# Patient Record
Sex: Female | Born: 1935 | Race: White | Hispanic: No | Marital: Married | Smoking: Current some day smoker
Health system: Southern US, Community
[De-identification: ages and names within clinical notes are randomized; demographics above are authoritative.]

## PROBLEM LIST (undated history)

## (undated) DIAGNOSIS — E049 Nontoxic goiter, unspecified: Secondary | ICD-10-CM

## (undated) DIAGNOSIS — M1990 Unspecified osteoarthritis, unspecified site: Secondary | ICD-10-CM

## (undated) DIAGNOSIS — G4700 Insomnia, unspecified: Secondary | ICD-10-CM

## (undated) DIAGNOSIS — I10 Essential (primary) hypertension: Secondary | ICD-10-CM

## (undated) DIAGNOSIS — G5600 Carpal tunnel syndrome, unspecified upper limb: Secondary | ICD-10-CM

## (undated) DIAGNOSIS — I739 Peripheral vascular disease, unspecified: Secondary | ICD-10-CM

## (undated) DIAGNOSIS — K219 Gastro-esophageal reflux disease without esophagitis: Secondary | ICD-10-CM

## (undated) DIAGNOSIS — K5090 Crohn's disease, unspecified, without complications: Secondary | ICD-10-CM

## (undated) DIAGNOSIS — I340 Nonrheumatic mitral (valve) insufficiency: Secondary | ICD-10-CM

## (undated) DIAGNOSIS — M7060 Trochanteric bursitis, unspecified hip: Secondary | ICD-10-CM

## (undated) DIAGNOSIS — G43909 Migraine, unspecified, not intractable, without status migrainosus: Secondary | ICD-10-CM

## (undated) DIAGNOSIS — G5603 Carpal tunnel syndrome, bilateral upper limbs: Principal | ICD-10-CM

## (undated) DIAGNOSIS — F329 Major depressive disorder, single episode, unspecified: Secondary | ICD-10-CM

## (undated) DIAGNOSIS — I779 Disorder of arteries and arterioles, unspecified: Secondary | ICD-10-CM

## (undated) DIAGNOSIS — N184 Chronic kidney disease, stage 4 (severe): Secondary | ICD-10-CM

## (undated) DIAGNOSIS — E039 Hypothyroidism, unspecified: Secondary | ICD-10-CM

## (undated) DIAGNOSIS — M810 Age-related osteoporosis without current pathological fracture: Secondary | ICD-10-CM

## (undated) DIAGNOSIS — E785 Hyperlipidemia, unspecified: Secondary | ICD-10-CM

## (undated) DIAGNOSIS — E119 Type 2 diabetes mellitus without complications: Secondary | ICD-10-CM

## (undated) DIAGNOSIS — F32A Depression, unspecified: Secondary | ICD-10-CM

## (undated) DIAGNOSIS — I2510 Atherosclerotic heart disease of native coronary artery without angina pectoris: Secondary | ICD-10-CM

## (undated) HISTORY — PX: APPENDECTOMY: SHX54

## (undated) HISTORY — DX: Hyperlipidemia, unspecified: E78.5

## (undated) HISTORY — DX: Carpal tunnel syndrome, bilateral upper limbs: G56.03

## (undated) HISTORY — DX: Age-related osteoporosis without current pathological fracture: M81.0

## (undated) HISTORY — DX: Nontoxic goiter, unspecified: E04.9

## (undated) HISTORY — DX: Trochanteric bursitis, unspecified hip: M70.60

## (undated) HISTORY — DX: Nonrheumatic mitral (valve) insufficiency: I34.0

## (undated) HISTORY — DX: Atherosclerotic heart disease of native coronary artery without angina pectoris: I25.10

## (undated) HISTORY — DX: Insomnia, unspecified: G47.00

## (undated) HISTORY — DX: Migraine, unspecified, not intractable, without status migrainosus: G43.909

## (undated) HISTORY — DX: Hypothyroidism, unspecified: E03.9

## (undated) HISTORY — DX: Chronic kidney disease, stage 4 (severe): N18.4

## (undated) HISTORY — PX: ABDOMINAL HYSTERECTOMY: SHX81

## (undated) HISTORY — PX: CATARACT EXTRACTION W/ INTRAOCULAR LENS  IMPLANT, BILATERAL: SHX1307

## (undated) HISTORY — DX: Carpal tunnel syndrome, unspecified upper limb: G56.00

## (undated) HISTORY — DX: Peripheral vascular disease, unspecified: I73.9

## (undated) HISTORY — DX: Gastro-esophageal reflux disease without esophagitis: K21.9

## (undated) HISTORY — DX: Unspecified osteoarthritis, unspecified site: M19.90

## (undated) HISTORY — DX: Depression, unspecified: F32.A

## (undated) HISTORY — DX: Disorder of arteries and arterioles, unspecified: I77.9

## (undated) HISTORY — DX: Major depressive disorder, single episode, unspecified: F32.9

---

## 2008-07-07 ENCOUNTER — Encounter: Admission: RE | Admit: 2008-07-07 | Discharge: 2008-07-07 | Payer: Self-pay | Admitting: Gastroenterology

## 2008-09-22 ENCOUNTER — Encounter: Admission: RE | Admit: 2008-09-22 | Discharge: 2008-09-22 | Payer: Self-pay | Admitting: Gastroenterology

## 2010-02-05 ENCOUNTER — Ambulatory Visit (HOSPITAL_COMMUNITY)
Admission: RE | Admit: 2010-02-05 | Discharge: 2010-02-05 | Payer: Self-pay | Source: Home / Self Care | Attending: Endocrinology | Admitting: Endocrinology

## 2010-05-07 ENCOUNTER — Other Ambulatory Visit: Payer: Self-pay | Admitting: Internal Medicine

## 2010-05-07 DIAGNOSIS — E049 Nontoxic goiter, unspecified: Secondary | ICD-10-CM

## 2010-05-09 ENCOUNTER — Ambulatory Visit
Admission: RE | Admit: 2010-05-09 | Discharge: 2010-05-09 | Disposition: A | Discharge status: Home / Self Care | Payer: Medicare Other | Source: Ambulatory Visit | Attending: Internal Medicine | Admitting: Internal Medicine

## 2010-05-09 DIAGNOSIS — E049 Nontoxic goiter, unspecified: Secondary | ICD-10-CM

## 2012-01-10 ENCOUNTER — Encounter (HOSPITAL_COMMUNITY): Payer: Self-pay | Admitting: *Deleted

## 2012-01-10 ENCOUNTER — Emergency Department (HOSPITAL_COMMUNITY): Payer: Medicare Other

## 2012-01-10 ENCOUNTER — Emergency Department (HOSPITAL_COMMUNITY)
Admission: EM | Admit: 2012-01-10 | Discharge: 2012-01-10 | Disposition: A | Discharge status: Home / Self Care | Payer: Medicare Other | Attending: Emergency Medicine | Admitting: Emergency Medicine

## 2012-01-10 DIAGNOSIS — I10 Essential (primary) hypertension: Secondary | ICD-10-CM | POA: Insufficient documentation

## 2012-01-10 DIAGNOSIS — R1013 Epigastric pain: Secondary | ICD-10-CM | POA: Insufficient documentation

## 2012-01-10 DIAGNOSIS — Z794 Long term (current) use of insulin: Secondary | ICD-10-CM | POA: Insufficient documentation

## 2012-01-10 DIAGNOSIS — F17200 Nicotine dependence, unspecified, uncomplicated: Secondary | ICD-10-CM | POA: Insufficient documentation

## 2012-01-10 DIAGNOSIS — E119 Type 2 diabetes mellitus without complications: Secondary | ICD-10-CM | POA: Insufficient documentation

## 2012-01-10 DIAGNOSIS — R110 Nausea: Secondary | ICD-10-CM | POA: Insufficient documentation

## 2012-01-10 DIAGNOSIS — K5090 Crohn's disease, unspecified, without complications: Secondary | ICD-10-CM | POA: Insufficient documentation

## 2012-01-10 DIAGNOSIS — Z79899 Other long term (current) drug therapy: Secondary | ICD-10-CM | POA: Insufficient documentation

## 2012-01-10 DIAGNOSIS — R109 Unspecified abdominal pain: Secondary | ICD-10-CM

## 2012-01-10 HISTORY — DX: Essential (primary) hypertension: I10

## 2012-01-10 HISTORY — DX: Type 2 diabetes mellitus without complications: E11.9

## 2012-01-10 HISTORY — DX: Crohn's disease, unspecified, without complications: K50.90

## 2012-01-10 LAB — LACTIC ACID, PLASMA: Lactic Acid, Venous: 1.4 mmol/L (ref 0.5–2.2)

## 2012-01-10 LAB — URINALYSIS, ROUTINE W REFLEX MICROSCOPIC
Ketones, ur: NEGATIVE mg/dL
Leukocytes, UA: NEGATIVE
Nitrite: NEGATIVE
Protein, ur: NEGATIVE mg/dL
pH: 5 (ref 5.0–8.0)

## 2012-01-10 LAB — COMPREHENSIVE METABOLIC PANEL
Albumin: 3.9 g/dL (ref 3.5–5.2)
BUN: 28 mg/dL — ABNORMAL HIGH (ref 6–23)
Calcium: 9.8 mg/dL (ref 8.4–10.5)
Chloride: 99 mEq/L (ref 96–112)
Creatinine, Ser: 1.01 mg/dL (ref 0.50–1.10)
Total Bilirubin: 0.2 mg/dL — ABNORMAL LOW (ref 0.3–1.2)

## 2012-01-10 LAB — CBC WITH DIFFERENTIAL/PLATELET
Basophils Relative: 0 % (ref 0–1)
Eosinophils Absolute: 0.1 10*3/uL (ref 0.0–0.7)
Eosinophils Relative: 1 % (ref 0–5)
HCT: 41.8 % (ref 36.0–46.0)
Hemoglobin: 13.8 g/dL (ref 12.0–15.0)
MCH: 28.7 pg (ref 26.0–34.0)
MCHC: 33 g/dL (ref 30.0–36.0)
MCV: 86.9 fL (ref 78.0–100.0)
Monocytes Absolute: 0.9 10*3/uL (ref 0.1–1.0)
Monocytes Relative: 6 % (ref 3–12)
Neutro Abs: 13.5 10*3/uL — ABNORMAL HIGH (ref 1.7–7.7)
RDW: 14.6 % (ref 11.5–15.5)

## 2012-01-10 LAB — GLUCOSE, CAPILLARY: Glucose-Capillary: 88 mg/dL (ref 70–99)

## 2012-01-10 LAB — LIPASE, BLOOD: Lipase: 11 U/L (ref 11–59)

## 2012-01-10 MED ORDER — SODIUM CHLORIDE 0.9 % IV SOLN
INTRAVENOUS | Status: DC
  Administered 2012-01-10: 16:00:00 via INTRAVENOUS

## 2012-01-10 MED ORDER — HYDROMORPHONE HCL PF 1 MG/ML IJ SOLN
1.0000 mg | Freq: Once | INTRAMUSCULAR | Status: AC
  Administered 2012-01-10: 1 mg via INTRAVENOUS
  Filled 2012-01-10: qty 1

## 2012-01-10 MED ORDER — IOHEXOL 300 MG/ML  SOLN
100.0000 mL | Freq: Once | INTRAMUSCULAR | Status: AC | PRN
  Administered 2012-01-10: 100 mL via INTRAVENOUS

## 2012-01-10 MED ORDER — ONDANSETRON HCL 4 MG/2ML IJ SOLN
4.0000 mg | Freq: Once | INTRAMUSCULAR | Status: AC
  Administered 2012-01-10: 4 mg via INTRAVENOUS
  Filled 2012-01-10: qty 2

## 2012-01-10 NOTE — ED Provider Notes (Signed)
History     CSN: [id number]  Arrival date & time 01/10/12  1425   First MD Initiated Contact with Patient 01/10/12 1507      Chief Complaint  Patient presents with  . Abdominal Pain    (Consider location/radiation/quality/duration/timing/severity/associated sxs/prior treatment) Patient is a 78 y.o. female presenting with abdominal pain. The history is provided by the patient.  Abdominal Pain The primary symptoms of the illness include abdominal pain.   patient here with sudden onset of abdominal pain 2 hours prior to arrival. Pain localized in the upper abdomen without radiation. Similar episode yesterday. Has had this before in the past without diagnosis. She has had nausea but no vomiting. No fever or chills. No urinary symptoms. Nothing makes her symptoms better worse and no treatment used prior to arrival  Past Medical History  Diagnosis Date  . Diabetes mellitus without complication   . Hypertension   . Crohn's disease     Past Surgical History  Procedure Date  . Abdominal hysterectomy   . Appendectomy     History reviewed. No pertinent family history.  History  Substance Use Topics  . Smoking status: Current Every Day Smoker    Types: Cigarettes  . Smokeless tobacco: Not on file  . Alcohol Use:     OB History    Grav Para Term Preterm Abortions TAB SAB Ect Mult Living                  Review of Systems  Gastrointestinal: Positive for abdominal pain.  All other systems reviewed and are negative.    Allergies  Codeine; Erythromycin; and Penicillins  Home Medications   Current Outpatient Rx  Name  Route  Sig  Dispense  Refill  . AMITRIPTYLINE HCL 25 MG PO TABS   Oral   Take 25 mg by mouth at bedtime.         Marland Kitchen CLINDINIUM-CHLORDIAZEPOXIDE 2.5-5 MG PO CAPS   Oral   Take 1 capsule by mouth 2 (two) times daily.         . INSULIN GLARGINE 100 UNIT/ML [place] SOLN   Subcutaneous   Inject 22 Units into the skin every morning.         Marland Kitchen  LEVOTHYROXINE SODIUM 88 MCG PO TABS   Oral   Take 88 mcg by mouth every morning.         Marland Kitchen LOSARTAN POTASSIUM 100 MG PO TABS   Oral   Take 100 mg by mouth daily.         Marland Kitchen MESALAMINE 1.2 G PO TBEC   Oral   Take 1,200 mg by mouth daily with breakfast.         . NYSTATIN 100000 UNIT/GM EX CREA   Topical   Apply 1 application topically 2 (two) times daily as needed. On skin as directed         . OXYCODONE-ACETAMINOPHEN 10-325 MG PO TABS   Oral   Take 1 tablet by mouth every 4 (four) hours as needed. For pain         . SITAGLIPTIN PHOSPHATE 100 MG PO TABS   Oral   Take 100 mg by mouth daily.         Marland Kitchen FLUCONAZOLE 150 MG PO TABS   Oral   Take 150 mg by mouth once.           BP 150/74  Pulse 90  Temp 98.4 F (36.9 C) (Oral)  Resp 26  Ht 5\' 6"  (  1.676 m)  Wt 167 lb (75.751 kg)  BMI 26.95 kg/m2  SpO2 100%  Physical Exam  Nursing note and vitals reviewed. Constitutional: She is oriented to person, place, and time. She appears well-developed and well-nourished.  Non-toxic appearance. No distress.  HENT:  Head: Normocephalic and atraumatic.  Eyes: Conjunctivae normal, EOM and lids are normal. Pupils are equal, round, and reactive to light.  Neck: Normal range of motion. Neck supple. No tracheal deviation present. No mass present.  Cardiovascular: Normal rate, regular rhythm and normal heart sounds.  Exam reveals no gallop.   No murmur heard. Pulmonary/Chest: Effort normal and breath sounds normal. No stridor. No respiratory distress. She has no decreased breath sounds. She has no wheezes. She has no rhonchi. She has no rales.  Abdominal: Soft. Normal appearance and bowel sounds are normal. She exhibits no distension. There is tenderness in the epigastric area. There is guarding. There is no rigidity, no rebound and no CVA tenderness.  Musculoskeletal: Normal range of motion. She exhibits no edema and no tenderness.  Neurological: She is alert and oriented to  person, place, and time. She has normal strength. No cranial nerve deficit or sensory deficit. GCS eye subscore is 4. GCS verbal subscore is 5. GCS motor subscore is 6.  Skin: Skin is warm and dry. No abrasion and no rash noted.  Psychiatric: Her speech is normal and behavior is normal. Her mood appears anxious.    ED Course  Procedures (including critical care time)   Labs Reviewed  CBC WITH DIFFERENTIAL  COMPREHENSIVE METABOLIC PANEL  LIPASE, BLOOD  URINALYSIS, ROUTINE W REFLEX MICROSCOPIC  URINE CULTURE  LACTIC ACID, PLASMA   No results found.   No diagnosis found.    MDM  Patient had abdominal CT which was negative for acute findings at this time. Her CBC did show a mild leukocytosis. She was given IV fluids and pain medication feels better at this time.  8:27 PM Repeat abdominal exam at time of discharge is nonsurgical. She is stable for discharge        Leota Jacobsen, MD 01/10/12 2027

## 2012-01-10 NOTE — ED Notes (Signed)
Pt c/o severe abd pain that started a 1300 today. Reports nausea no vomiting.

## 2012-01-10 NOTE — ED Notes (Signed)
Patient unable to urinate at this time. 

## 2012-01-10 NOTE — ED Notes (Signed)
Pt completed Contrast for CT scan

## 2012-01-12 LAB — URINE CULTURE

## 2013-02-19 ENCOUNTER — Encounter: Payer: Self-pay | Admitting: *Deleted

## 2013-02-19 ENCOUNTER — Encounter: Payer: Self-pay | Admitting: Cardiology

## 2013-02-19 DIAGNOSIS — N19 Unspecified kidney failure: Secondary | ICD-10-CM | POA: Insufficient documentation

## 2013-02-19 DIAGNOSIS — I10 Essential (primary) hypertension: Secondary | ICD-10-CM | POA: Insufficient documentation

## 2013-02-19 DIAGNOSIS — K5090 Crohn's disease, unspecified, without complications: Secondary | ICD-10-CM | POA: Insufficient documentation

## 2013-02-19 DIAGNOSIS — E049 Nontoxic goiter, unspecified: Secondary | ICD-10-CM | POA: Insufficient documentation

## 2013-02-19 DIAGNOSIS — M7060 Trochanteric bursitis, unspecified hip: Secondary | ICD-10-CM | POA: Insufficient documentation

## 2013-02-19 DIAGNOSIS — G5600 Carpal tunnel syndrome, unspecified upper limb: Secondary | ICD-10-CM | POA: Insufficient documentation

## 2013-02-19 DIAGNOSIS — G43909 Migraine, unspecified, not intractable, without status migrainosus: Secondary | ICD-10-CM | POA: Insufficient documentation

## 2013-02-19 DIAGNOSIS — F329 Major depressive disorder, single episode, unspecified: Secondary | ICD-10-CM | POA: Insufficient documentation

## 2013-02-19 DIAGNOSIS — G4700 Insomnia, unspecified: Secondary | ICD-10-CM | POA: Insufficient documentation

## 2013-02-19 DIAGNOSIS — M810 Age-related osteoporosis without current pathological fracture: Secondary | ICD-10-CM | POA: Insufficient documentation

## 2013-02-19 DIAGNOSIS — M1990 Unspecified osteoarthritis, unspecified site: Secondary | ICD-10-CM | POA: Insufficient documentation

## 2013-02-19 DIAGNOSIS — E785 Hyperlipidemia, unspecified: Secondary | ICD-10-CM | POA: Insufficient documentation

## 2013-02-19 DIAGNOSIS — F32A Depression, unspecified: Secondary | ICD-10-CM | POA: Insufficient documentation

## 2013-02-19 DIAGNOSIS — N179 Acute kidney failure, unspecified: Secondary | ICD-10-CM | POA: Insufficient documentation

## 2013-02-19 DIAGNOSIS — K219 Gastro-esophageal reflux disease without esophagitis: Secondary | ICD-10-CM | POA: Insufficient documentation

## 2013-02-19 DIAGNOSIS — E039 Hypothyroidism, unspecified: Secondary | ICD-10-CM | POA: Insufficient documentation

## 2013-02-19 DIAGNOSIS — E119 Type 2 diabetes mellitus without complications: Secondary | ICD-10-CM | POA: Insufficient documentation

## 2013-02-23 ENCOUNTER — Encounter: Payer: Self-pay | Admitting: Cardiology

## 2013-02-23 ENCOUNTER — Encounter: Payer: Self-pay | Admitting: General Surgery

## 2013-02-23 ENCOUNTER — Ambulatory Visit (INDEPENDENT_AMBULATORY_CARE_PROVIDER_SITE_OTHER): Payer: Medicare Other | Admitting: Cardiology

## 2013-02-23 VITALS — BP 164/80 | HR 80 | Ht 66.0 in | Wt 165.0 lb

## 2013-02-23 DIAGNOSIS — I10 Essential (primary) hypertension: Secondary | ICD-10-CM

## 2013-02-23 DIAGNOSIS — R079 Chest pain, unspecified: Secondary | ICD-10-CM

## 2013-02-23 NOTE — Progress Notes (Signed)
Tammie [place], [place],   [address] Phone: [phone] Fax:  [phone]  Date:  02/23/2013   ID:  Tammie West, DOB [birth year], MRN [id number]  PCP:  No primary provider on file.  Cardiologist:  Fransico Him, MD    History of Present Illness: Tammie West is a 80 y.o. female with a history of HTN and dyslipidemia as well as occasional tobacco use who presents today for evaluation of chest pressure.  She says that it occurred twice over the past 3 weeks.  She says it feels like a pressure that lasted a few minutes in the center of her chest with no radiation with no associated symptoms.  She says that she was sitting at her computer playing a game.  She denies any DOE, LE edema, dizziness, palpitations or syncope.     Wt Readings from Last 3 Encounters:  02/23/13 165 lb (74.844 kg)  01/10/12 167 lb (75.751 kg)     Past Medical History  Diagnosis Date  . Diabetes mellitus without complication   . Hypertension   . Crohn's disease   . Insomnia   . Hypothyroidism   . Goiter   . Osteoporosis   . Hyperlipidemia   . Depression   . Carpal tunnel syndrome   . Renal failure   . GERD (gastroesophageal reflux disease)   . Osteoarthritis   . Trochanteric bursitis   . Migraine     Current Outpatient Prescriptions  Medication Sig Dispense Refill  . amitriptyline (ELAVIL) 25 MG tablet Take 25 mg by mouth at bedtime.      . clidinium-chlordiazePOXIDE (LIBRAX) 2.5-5 MG per capsule Take 1 capsule by mouth 2 (two) times daily.      Marland Kitchen HUMALOG KWIKPEN 100 UNIT/ML KiwkPen       . hydrochlorothiazide (HYDRODIURIL) 25 MG tablet       . insulin glargine (LANTUS) 100 UNIT/ML injection Inject 22 Units into the skin every morning.      Marland Kitchen levothyroxine (SYNTHROID, LEVOTHROID) 88 MCG tablet Take 88 mcg by mouth every morning.      Marland Kitchen losartan (COZAAR) 100 MG tablet Take 100 mg by mouth daily.      . mesalamine (LIALDA) 1.2 G EC tablet Take 1,200 mg by mouth daily  with breakfast.      . simvastatin (ZOCOR) 10 MG tablet       . sitaGLIPtin (JANUVIA) 100 MG tablet Take 100 mg by mouth daily.      Marland Kitchen terconazole (TERAZOL 7) 0.4 % vaginal cream        No current facility-administered medications for this visit.    Allergies:    Allergies  Allergen Reactions  . Codeine     Nausea   . Erythromycin Hives and Swelling  . Penicillins Hives and Swelling    Social History:  The patient  reports that she has been smoking Cigarettes.  She has been smoking about 0.25 packs per day. She does not have any smokeless tobacco history on file. She reports that she does not drink alcohol or use illicit drugs.   Family History:  The patient's family history includes CAD in her brother; Emphysema in her father; Heart disease in her brother.   ROS:  Please see the history of present illness.      All other systems reviewed and negative.   PHYSICAL EXAM: VS:  BP 164/80  Pulse 80  Ht 5\' 6"  (1.676 m)  Wt 165 lb (74.844 kg)  BMI  26.64 kg/m2 Well nourished, well developed, in no acute distress HEENT: normal Neck: no JVD Cardiac:  normal S1, S2; RRR; no murmur Lungs:  clear to auscultation bilaterally, no wheezing, rhonchi or rales Abd: soft, nontender, no hepatomegaly Ext: no edema Skin: warm and dry Neuro:  CNs 2-12 intact, no focal abnormalities noted  EKG:  NSR with no ST changes  ASSESSMENT AND PLAN:  1. Chest pain in a patient with a family history of CAD and also a history of HTN , DM and dyslipidemia with tobacco use.  EKG is normal.  I think given her CRF that we should proceed with Stress myoview to rule out ischemia.  I will also get a 2D echo to assess LVF and diastolic function.  Signed, Fransico Him, MD 02/23/2013 2:12 PM

## 2013-02-23 NOTE — Patient Instructions (Addendum)
Your physician recommends that you continue on your current medications as directed. Please refer to the Current Medication list given to you today.  Your physician has requested that you have an echocardiogram. Echocardiography is a painless test that uses sound waves to create images of your heart. It provides your doctor with information about the size and shape of your heart and how well your heart's chambers and valves are working. This procedure takes approximately one hour. There are no restrictions for this procedure.  Your physician has requested that you have an exercise stress myoview. For further information please visit [email or web address]. Please follow instruction sheet, as given.  Your physician recommends that you schedule a follow-up appointment As Needed

## 2013-02-28 ENCOUNTER — Encounter: Payer: Self-pay | Admitting: Cardiovascular Disease

## 2013-02-28 ENCOUNTER — Ambulatory Visit (HOSPITAL_COMMUNITY): Payer: Medicare Other | Attending: Cardiology | Admitting: Radiology

## 2013-02-28 VITALS — BP 155/66 | HR 72 | Ht 66.0 in | Wt 166.0 lb

## 2013-02-28 DIAGNOSIS — F17200 Nicotine dependence, unspecified, uncomplicated: Secondary | ICD-10-CM | POA: Insufficient documentation

## 2013-02-28 DIAGNOSIS — R079 Chest pain, unspecified: Secondary | ICD-10-CM

## 2013-02-28 DIAGNOSIS — E119 Type 2 diabetes mellitus without complications: Secondary | ICD-10-CM | POA: Insufficient documentation

## 2013-02-28 DIAGNOSIS — Z8249 Family history of ischemic heart disease and other diseases of the circulatory system: Secondary | ICD-10-CM | POA: Insufficient documentation

## 2013-02-28 DIAGNOSIS — R0789 Other chest pain: Secondary | ICD-10-CM | POA: Insufficient documentation

## 2013-02-28 DIAGNOSIS — I10 Essential (primary) hypertension: Secondary | ICD-10-CM | POA: Insufficient documentation

## 2013-02-28 DIAGNOSIS — Z794 Long term (current) use of insulin: Secondary | ICD-10-CM | POA: Insufficient documentation

## 2013-02-28 DIAGNOSIS — R42 Dizziness and giddiness: Secondary | ICD-10-CM | POA: Insufficient documentation

## 2013-02-28 DIAGNOSIS — E785 Hyperlipidemia, unspecified: Secondary | ICD-10-CM | POA: Insufficient documentation

## 2013-02-28 MED ORDER — TECHNETIUM TC 99M SESTAMIBI GENERIC - CARDIOLITE
30.0000 | Freq: Once | INTRAVENOUS | Status: AC | PRN
  Administered 2013-02-28: 30 via INTRAVENOUS

## 2013-02-28 MED ORDER — TECHNETIUM TC 99M SESTAMIBI GENERIC - CARDIOLITE
10.0000 | Freq: Once | INTRAVENOUS | Status: AC | PRN
  Administered 2013-02-28: 10 via INTRAVENOUS

## 2013-02-28 MED ORDER — REGADENOSON 0.4 MG/5ML IV SOLN
0.4000 mg | Freq: Once | INTRAVENOUS | Status: AC
  Administered 2013-02-28: 0.4 mg via INTRAVENOUS

## 2013-02-28 NOTE — Progress Notes (Signed)
[facility] [address] [place], [place] [address] [phone]    Cardiology Nuclear Med Study  Tammie West is a 80 y.o. female     MRN : [id number]     DOB: Sep 05, 1932  Procedure Date: 02/28/2013  Nuclear Med Background Indication for Stress Test:  Evaluation for Ischemia History:  No known prior history of CAD Cardiac Risk Factors: Family History - CAD, Hypertension, IDDM, Lipids and Smoker  Symptoms:Chest Pressure without exertion (last occurrence 2-3 weeks ago),Dizziness   Nuclear Pre-Procedure Caffeine/Decaff Intake:  None NPO After: 5:30pm   Lungs:  clear O2 Sat: 98% on room air. IV 0.9% NS with Angio Cath:  22g  IV Site: R Hand  IV Started by:  Matilde Haymaker, RN  Chest Size (in):  42 Cup Size: B  Height: 5\' 6"  (1.676 m)  Weight:  166 lb (75.297 kg)  BMI:  Body mass index is 26.81 kg/(m^2). Tech Comments:  Held diabetes meds this am; CBG 113    Nuclear Med Study 1 or 2 day study: 1 day  Stress Test Type:  Treadmill/Lexiscan  Reading MD: Dorris Carnes, MD  Order Authorizing Provider:  Fransico Him, MD  Resting Radionuclide: Technetium 60m Sestamibi  Resting Radionuclide Dose: 11.0 mCi   Stress Radionuclide:  Technetium 64m Sestamibi  Stress Radionuclide Dose: 33.0 mCi           Stress Protocol Rest HR: 72 Stress HR: 109  Rest BP: 155/66 Stress BP: 194/102  Exercise Time (min): 4:00 METS: 3.5   Predicted Max HR: 142 bpm % Max HR: 76.76 bpm Rate Pressure Product: 21146   Dose of Adenosine (mg):  n/a Dose of Lexiscan: 0.4 mg  Dose of Atropine (mg): n/a Dose of Dobutamine: n/a mcg/kg/min (at max HR)  Stress Test Technologist: Irven Baltimore, RN  Nuclear Technologist:  Charlton Amor, CNMT     Rest Procedure:  Myocardial perfusion imaging was performed at rest 45 minutes following the intravenous administration of Technetium 63m Sestamibi. Rest ECG: NSR - Normal EKG  Stress Procedure: The patient attempted to walk the  treadmill utilizing the Bruce Protocol for 2 minutes , but was unable to reach target heart rate due to fatigue. The patient received IV Lexiscan 0.4 mg over 15-seconds with concurrent low level exercise and then Technetium 2m Sestamibi was injected at 30-seconds while the patient continued walking one more minute. The patient complained of fatigue, but denied chest pain. There was a hypertensive response to [employer]. Quantitative spect images were obtained after a 45-minute delay. Stress ECG: No significant change from baseline ECG  QPS Raw Data Images: Soft tissue (diaphragm, bowel actvity) underlie heart.   Stress Images:  Normal homogeneous uptake in all areas of the myocardium. Rest Images:  Normal homogeneous uptake in all areas of the myocardium. Subtraction (SDS):  No evidence of ischemia. Transient Ischemic Dilatation (Normal <1.22):  0.82 Lung/Heart Ratio (Normal <0.45):  0.26  Quantitative Gated Spect Images QGS EDV:  58 ml QGS ESV:  11 ml  Impression Exercise Capacity:  Lexiscan with low level exercise.  Unable to do ETT   BP Response:  Normal blood pressure response. Clinical Symptoms:  No chest pain. ECG Impression:  No significant ST segment change suggestive of ischemia. Comparison with Prior Nuclear Study: No images to compare  Overall Impression:  Normal stress nuclear study.  LV Ejection Fraction: 81%.  LV Wall Motion:  NL LV Function; NL Wall Motion  Dorris Carnes

## 2013-03-01 ENCOUNTER — Telehealth: Payer: Self-pay | Admitting: Cardiology

## 2013-03-01 NOTE — Telephone Encounter (Signed)
Please let patient know that stress test was normal with normal LVF

## 2013-03-02 NOTE — Telephone Encounter (Signed)
Pt made aware

## 2013-03-03 ENCOUNTER — Ambulatory Visit (HOSPITAL_COMMUNITY): Payer: Medicare Other | Attending: Cardiology | Admitting: Radiology

## 2013-03-03 ENCOUNTER — Encounter: Payer: Self-pay | Admitting: Cardiology

## 2013-03-03 DIAGNOSIS — F17200 Nicotine dependence, unspecified, uncomplicated: Secondary | ICD-10-CM | POA: Insufficient documentation

## 2013-03-03 DIAGNOSIS — Z8249 Family history of ischemic heart disease and other diseases of the circulatory system: Secondary | ICD-10-CM | POA: Insufficient documentation

## 2013-03-03 DIAGNOSIS — I059 Rheumatic mitral valve disease, unspecified: Secondary | ICD-10-CM | POA: Insufficient documentation

## 2013-03-03 DIAGNOSIS — R079 Chest pain, unspecified: Secondary | ICD-10-CM | POA: Insufficient documentation

## 2013-03-03 DIAGNOSIS — E785 Hyperlipidemia, unspecified: Secondary | ICD-10-CM | POA: Insufficient documentation

## 2013-03-03 DIAGNOSIS — I10 Essential (primary) hypertension: Secondary | ICD-10-CM | POA: Insufficient documentation

## 2013-03-03 DIAGNOSIS — E119 Type 2 diabetes mellitus without complications: Secondary | ICD-10-CM | POA: Insufficient documentation

## 2013-03-03 NOTE — Progress Notes (Signed)
Echocardiogram performed.  

## 2013-08-26 ENCOUNTER — Ambulatory Visit
Admission: RE | Admit: 2013-08-26 | Discharge: 2013-08-26 | Disposition: A | Discharge status: Home / Self Care | Payer: Medicare Other | Source: Ambulatory Visit | Attending: Physician Assistant | Admitting: Physician Assistant

## 2013-08-26 ENCOUNTER — Other Ambulatory Visit: Payer: Self-pay | Admitting: Physician Assistant

## 2013-08-26 ENCOUNTER — Ambulatory Visit: Payer: Medicare Other

## 2013-08-26 DIAGNOSIS — M545 Low back pain, unspecified: Secondary | ICD-10-CM

## 2013-08-26 DIAGNOSIS — M25552 Pain in left hip: Secondary | ICD-10-CM

## 2014-04-18 ENCOUNTER — Encounter (HOSPITAL_COMMUNITY): Payer: Self-pay | Admitting: Emergency Medicine

## 2014-04-18 ENCOUNTER — Emergency Department (HOSPITAL_COMMUNITY)
Admission: EM | Admit: 2014-04-18 | Discharge: 2014-04-18 | Disposition: A | Discharge status: Home / Self Care | Payer: Medicare Other | Attending: Emergency Medicine | Admitting: Emergency Medicine

## 2014-04-18 ENCOUNTER — Emergency Department (HOSPITAL_COMMUNITY): Payer: Medicare Other

## 2014-04-18 DIAGNOSIS — K219 Gastro-esophageal reflux disease without esophagitis: Secondary | ICD-10-CM | POA: Insufficient documentation

## 2014-04-18 DIAGNOSIS — E119 Type 2 diabetes mellitus without complications: Secondary | ICD-10-CM | POA: Insufficient documentation

## 2014-04-18 DIAGNOSIS — M25562 Pain in left knee: Secondary | ICD-10-CM

## 2014-04-18 DIAGNOSIS — I10 Essential (primary) hypertension: Secondary | ICD-10-CM | POA: Diagnosis not present

## 2014-04-18 DIAGNOSIS — M7122 Synovial cyst of popliteal space [Baker], left knee: Secondary | ICD-10-CM | POA: Diagnosis not present

## 2014-04-18 DIAGNOSIS — E785 Hyperlipidemia, unspecified: Secondary | ICD-10-CM | POA: Diagnosis not present

## 2014-04-18 DIAGNOSIS — K5090 Crohn's disease, unspecified, without complications: Secondary | ICD-10-CM | POA: Diagnosis not present

## 2014-04-18 DIAGNOSIS — E039 Hypothyroidism, unspecified: Secondary | ICD-10-CM | POA: Diagnosis not present

## 2014-04-18 DIAGNOSIS — Z880 Allergy status to penicillin: Secondary | ICD-10-CM | POA: Diagnosis not present

## 2014-04-18 DIAGNOSIS — Z87448 Personal history of other diseases of urinary system: Secondary | ICD-10-CM | POA: Insufficient documentation

## 2014-04-18 DIAGNOSIS — Z720 Tobacco use: Secondary | ICD-10-CM | POA: Diagnosis not present

## 2014-04-18 DIAGNOSIS — F329 Major depressive disorder, single episode, unspecified: Secondary | ICD-10-CM | POA: Insufficient documentation

## 2014-04-18 DIAGNOSIS — G43909 Migraine, unspecified, not intractable, without status migrainosus: Secondary | ICD-10-CM | POA: Insufficient documentation

## 2014-04-18 DIAGNOSIS — Z794 Long term (current) use of insulin: Secondary | ICD-10-CM | POA: Diagnosis not present

## 2014-04-18 DIAGNOSIS — Z79899 Other long term (current) drug therapy: Secondary | ICD-10-CM | POA: Insufficient documentation

## 2014-04-18 MED ORDER — HYDROMORPHONE HCL 1 MG/ML IJ SOLN
1.0000 mg | Freq: Once | INTRAMUSCULAR | Status: AC
  Administered 2014-04-18: 1 mg via INTRAMUSCULAR
  Filled 2014-04-18: qty 1

## 2014-04-18 MED ORDER — OXYCODONE-ACETAMINOPHEN 5-325 MG PO TABS
1.0000 | ORAL_TABLET | Freq: Once | ORAL | Status: AC
  Administered 2014-04-18: 1 via ORAL
  Filled 2014-04-18: qty 1

## 2014-04-18 NOTE — Progress Notes (Signed)
*  Preliminary Results* Left lower extremity venous duplex completed. Left lower extremity is negative for deep vein thrombosis. There is evidence of left Baker's cyst.  Preliminary results discussed with Dr. Ashok Cordia.  04/18/2014 10:01 AM  Maudry Mayhew, RVT, RDCS, RDMS

## 2014-04-18 NOTE — Discharge Instructions (Signed)
It was our pleasure to provide your ER care today - we hope that you feel better.  Your vascular ultrasound was read as showing a Baker's cyst.  Follow up with orthopedist in coming week - call office to arrange appointment.  Use walker. Rest. Cold pack to sore area.   Take motrin or aleve as need for pain.  You may also take your oxycodone as need.  Return to ER if worse, new symptoms, fevers, redness, severe swelling, other concern.  You were given pain medication in the ER - no driving for the next 4 hours.       Baker Cyst A Baker cyst is a sac-like structure that forms in the back of the knee. It is filled with the same fluid that is located in your knee. This fluid lubricates the bones and cartilage of the knee and allows them to move over each other more easily. CAUSES  When the knee becomes injured or inflamed, increased fluid forms in the knee. When this happens, the joint lining is pushed out behind the knee and forms the Baker cyst. This cyst may also be caused by inflammation from arthritic conditions and infections. SIGNS AND SYMPTOMS  A Baker cyst usually has no symptoms. When the cyst is substantially enlarged:  You may feel pressure behind the knee, stiffness in the knee, or a mass in the area behind the knee.  You may develop pain, redness, and swelling in the calf. This can suggest a blood clot and requires evaluation by your health care provider. DIAGNOSIS  A Baker cyst is most often found during an ultrasound exam. This exam may have been performed for other reasons, and the cyst was found incidentally. Sometimes an MRI is used. This picks up other problems within a joint that an ultrasound exam may not. If the Baker cyst developed immediately after an injury, X-ray exams may be used to diagnose the cyst. TREATMENT  The treatment depends on the cause of the cyst. Anti-inflammatory medicines and rest often will be prescribed. If the cyst is caused by a bacterial  infection, antibiotic medicines may be prescribed.  HOME CARE INSTRUCTIONS   If the cyst was caused by an injury, for the first 24 hours, keep the injured leg elevated on 2 pillows while lying down.  For the first 24 hours while you are awake, apply ice to the injured area:  Put ice in a plastic bag.  Place a towel between your skin and the bag.  Leave the ice on for 20 minutes, 2-3 times a day.  Only take over-the-counter or prescription medicines for pain, discomfort, or fever as directed by your health care provider.  Only take antibiotic medicine as directed. Make sure to finish it even if you start to feel better. MAKE SURE YOU:   Understand these instructions.  Will watch your condition.  Will get help right away if you are not doing well or get worse. Document Released: 01/13/2005 Document Revised: 11/03/2012 Document Reviewed: 08/25/2012 [facility] Patient Information 2015 [place], [place]. This information is not intended to replace advice given to you by your health care provider. Make sure you discuss any questions you have with your health care provider.

## 2014-04-18 NOTE — ED Provider Notes (Signed)
CSN: [id number]     Arrival date & time 04/18/14  0719 History   First MD Initiated Contact with Patient 04/18/14 0725     Chief Complaint  Patient presents with  . Leg Pain     (Consider location/radiation/quality/duration/timing/severity/associated sxs/prior Treatment) Patient is a 81 y.o. female presenting with leg pain. The history is provided by the patient and the spouse.  Leg Pain Associated symptoms: no back pain, no fever and no neck pain   pt c/o severe pain to left knee for the past 3-4 days. Denies injury or fall. Pain constant, dull, located behind left knee,  non radiating, worse w standing/walking. No calf pain or claudication. No swelling or redness. No fever or chills. Denies hip or ankle pain. Pt states has chronic low back pain - no recent change in back pain. No leg numbness/weakness. No hx dvt or pe. Had appt w pcp this morning for same, but states came to ED as pain severe. No hx Bakers cyst.   Past Medical History  Diagnosis Date  . Diabetes mellitus without complication   . Hypertension   . Crohn's disease   . Insomnia   . Hypothyroidism   . Goiter   . Osteoporosis   . Hyperlipidemia   . Depression   . Carpal tunnel syndrome   . Renal failure   . GERD (gastroesophageal reflux disease)   . Osteoarthritis   . Trochanteric bursitis   . Migraine    Past Surgical History  Procedure Laterality Date  . Abdominal hysterectomy    . Appendectomy     Family History  Problem Relation Age of Onset  . Emphysema Father   . CAD Brother   . Heart disease Brother    History  Substance Use Topics  . Smoking status: Current Some Day Smoker -- 0.25 packs/day    Types: Cigarettes  . Smokeless tobacco: Not on file  . Alcohol Use: No   OB History    No data available     Review of Systems  Constitutional: Negative for fever and chills.  HENT: Negative for sore throat.   Eyes: Negative for redness.  Respiratory: Negative for shortness of breath.    Cardiovascular: Negative for chest pain and leg swelling.  Gastrointestinal: Negative for vomiting and abdominal pain.  Genitourinary: Negative for flank pain.  Musculoskeletal: Negative for back pain and neck pain.  Skin: Negative for rash and wound.  Neurological: Negative for weakness, numbness and headaches.  Hematological: Does not bruise/bleed easily.  Psychiatric/Behavioral: Negative for confusion.      Allergies  Codeine; Erythromycin; and Penicillins  Home Medications   Prior to Admission medications   Medication Sig Start Date End Date Taking? Authorizing Provider  amitriptyline (ELAVIL) 25 MG tablet Take 25 mg by mouth at bedtime.    Historical Provider, MD  clidinium-chlordiazePOXIDE (LIBRAX) 2.5-5 MG per capsule Take 1 capsule by mouth 2 (two) times daily.    Historical Provider, MD  Cleda Clarks 100 UNIT/ML KiwkPen  12/03/12   Historical Provider, MD  hydrochlorothiazide (HYDRODIURIL) 25 MG tablet  12/27/12   Historical Provider, MD  insulin glargine (LANTUS) 100 UNIT/ML injection Inject 22 Units into the skin every morning.    Historical Provider, MD  levothyroxine (SYNTHROID, LEVOTHROID) 88 MCG tablet Take 88 mcg by mouth every morning.    Historical Provider, MD  losartan (COZAAR) 100 MG tablet Take 100 mg by mouth daily.    Historical Provider, MD  mesalamine (LIALDA) 1.2 G EC tablet Take 1,200  mg by mouth daily with breakfast.    Historical Provider, MD  simvastatin (ZOCOR) 10 MG tablet  02/09/13   Historical Provider, MD  sitaGLIPtin (JANUVIA) 100 MG tablet Take 100 mg by mouth daily.    Historical Provider, MD  terconazole (TERAZOL 7) 0.4 % vaginal cream  02/22/13   Historical Provider, MD   BP 118/50 mmHg  Pulse 90  Temp(Src) 99.1 F (37.3 C) (Oral)  Resp 16  SpO2 100% Physical Exam  Constitutional: She is oriented to person, place, and time. She appears well-developed and well-nourished. No distress.  HENT:  Head: Atraumatic.  Eyes: Conjunctivae are  normal. No scleral icterus.  Neck: Neck supple. No tracheal deviation present.  Cardiovascular: Normal rate.   Pulmonary/Chest: Effort normal. No respiratory distress.  Abdominal: Normal appearance.  Musculoskeletal: She exhibits no edema.  Tenderness left knee posteriorly. No gross sts noted. No skin changes, rash or erythema. No knee/joint pain w passive rom. No pain or tenderness to left hip or ankle. No leg swelling or calf tenderness. Distal pulses palp.   Neurological: She is alert and oriented to person, place, and time.  LLE motor intact, stre 5/5. sens grossly intact.   Skin: Skin is warm and dry. No rash noted.  Psychiatric: She has a normal mood and affect.  Nursing note and vitals reviewed.   ED Course  Procedures (including critical care time) Labs Review   Dg Knee Complete 4 Views Left  04/18/2014   CLINICAL DATA:  Increasing left knee pain last 4 days, no known trauma  EXAM: LEFT KNEE - COMPLETE 4+ VIEW  COMPARISON:  None.  FINDINGS: Five views of the left knee submitted. No acute fracture or subluxation. Mild narrowing of medial joint compartment. Mild narrowing of patellofemoral joint space. Small joint effusion.  IMPRESSION: No acute fracture or subluxation. Mild degenerative changes. Small joint effusion.   Electronically Signed   By: Lahoma Crocker M.D.   On: 04/18/2014 08:27      MDM   Pt requests pain meds. Normally takex oxycodone 15 mg 3-4 x per day for chronic back pain.  Dilaudid 1 mg im.  Reviewed nursing notes and prior charts for additional history.   Recheck pain improved but persists. Pt fully awake and alert.  Percocet 1 po.  Vascular doppler report as below, c/w Baker's cyst - discussed results w pt.  Mildrid, Myrick Female 19-Apr-[id number]    Progress Notes by Elmo Putt at 04/18/2014 10:01 AM    Author: Doyne Keel Simonetti Service: Vascular Lab Author Type: Cardiovascular Sonographer   Filed: 04/18/2014 10:01 AM Note  Time: 04/18/2014 10:01 AM Status: Signed   Editor: Doyne Keel Simonetti (Cardiovascular Sonographer)     Expand All Collapse All   *Preliminary Results* Left lower extremity venous duplex completed. Left lower extremity is negative for deep vein thrombosis. There is evidence of left Baker's cyst.  Preliminary results discussed with Dr. Ashok Cordia.  04/18/2014 10:01 AM  Maudry Mayhew, RVT, RDCS, RDMS        Lajean Saver, MD 04/18/14 1014

## 2014-04-18 NOTE — ED Notes (Signed)
Bed: [id number] Expected date: 04/18/14 Expected time: 7:04 AM Means of arrival: Ambulance Comments: 81 yr old, leg swelling

## 2014-04-18 NOTE — ED Notes (Addendum)
Pt has had pain and swelling to posterior left knee for last 5 days. Pt has appointment this morning with PCP but pain was worse, unable to stand and use her walker as usual.

## 2014-07-27 ENCOUNTER — Encounter (HOSPITAL_COMMUNITY): Payer: Self-pay | Admitting: *Deleted

## 2014-07-27 ENCOUNTER — Emergency Department (HOSPITAL_COMMUNITY): Payer: Medicare Other

## 2014-07-27 ENCOUNTER — Emergency Department (HOSPITAL_COMMUNITY)
Admission: EM | Admit: 2014-07-27 | Discharge: 2014-07-27 | Disposition: A | Discharge status: Home / Self Care | Payer: Medicare Other | Attending: Emergency Medicine | Admitting: Emergency Medicine

## 2014-07-27 DIAGNOSIS — Z79899 Other long term (current) drug therapy: Secondary | ICD-10-CM | POA: Diagnosis not present

## 2014-07-27 DIAGNOSIS — I10 Essential (primary) hypertension: Secondary | ICD-10-CM | POA: Diagnosis not present

## 2014-07-27 DIAGNOSIS — Z8739 Personal history of other diseases of the musculoskeletal system and connective tissue: Secondary | ICD-10-CM | POA: Insufficient documentation

## 2014-07-27 DIAGNOSIS — F329 Major depressive disorder, single episode, unspecified: Secondary | ICD-10-CM | POA: Diagnosis not present

## 2014-07-27 DIAGNOSIS — Z720 Tobacco use: Secondary | ICD-10-CM | POA: Diagnosis not present

## 2014-07-27 DIAGNOSIS — K219 Gastro-esophageal reflux disease without esophagitis: Secondary | ICD-10-CM | POA: Insufficient documentation

## 2014-07-27 DIAGNOSIS — Z880 Allergy status to penicillin: Secondary | ICD-10-CM | POA: Diagnosis not present

## 2014-07-27 DIAGNOSIS — Z792 Long term (current) use of antibiotics: Secondary | ICD-10-CM | POA: Diagnosis not present

## 2014-07-27 DIAGNOSIS — G43909 Migraine, unspecified, not intractable, without status migrainosus: Secondary | ICD-10-CM | POA: Diagnosis not present

## 2014-07-27 DIAGNOSIS — Z87448 Personal history of other diseases of urinary system: Secondary | ICD-10-CM | POA: Diagnosis not present

## 2014-07-27 DIAGNOSIS — E039 Hypothyroidism, unspecified: Secondary | ICD-10-CM | POA: Insufficient documentation

## 2014-07-27 DIAGNOSIS — E785 Hyperlipidemia, unspecified: Secondary | ICD-10-CM | POA: Insufficient documentation

## 2014-07-27 DIAGNOSIS — Z794 Long term (current) use of insulin: Secondary | ICD-10-CM | POA: Insufficient documentation

## 2014-07-27 DIAGNOSIS — R079 Chest pain, unspecified: Secondary | ICD-10-CM | POA: Insufficient documentation

## 2014-07-27 DIAGNOSIS — E049 Nontoxic goiter, unspecified: Secondary | ICD-10-CM | POA: Insufficient documentation

## 2014-07-27 DIAGNOSIS — E119 Type 2 diabetes mellitus without complications: Secondary | ICD-10-CM | POA: Diagnosis not present

## 2014-07-27 DIAGNOSIS — G4700 Insomnia, unspecified: Secondary | ICD-10-CM | POA: Diagnosis not present

## 2014-07-27 LAB — COMPREHENSIVE METABOLIC PANEL
ALT: 26 U/L (ref 14–54)
AST: 21 U/L (ref 15–41)
Albumin: 3.7 g/dL (ref 3.5–5.0)
Alkaline Phosphatase: 129 U/L — ABNORMAL HIGH (ref 38–126)
Anion gap: 8 (ref 5–15)
BUN: 43 mg/dL — ABNORMAL HIGH (ref 6–20)
CALCIUM: 8.9 mg/dL (ref 8.9–10.3)
CHLORIDE: 102 mmol/L (ref 101–111)
CO2: 26 mmol/L (ref 22–32)
Creatinine, Ser: 1.33 mg/dL — ABNORMAL HIGH (ref 0.44–1.00)
GFR calc Af Amer: 43 mL/min — ABNORMAL LOW (ref >60)
GFR calc non Af Amer: 37 mL/min — ABNORMAL LOW (ref >60)
Glucose, Bld: 255 mg/dL — ABNORMAL HIGH (ref 65–99)
POTASSIUM: 4.4 mmol/L (ref 3.5–5.1)
SODIUM: 136 mmol/L (ref 135–145)
Total Bilirubin: 0.4 mg/dL (ref 0.3–1.2)
Total Protein: 7.2 g/dL (ref 6.5–8.1)

## 2014-07-27 LAB — CBC
HEMATOCRIT: 40.7 % (ref 36.0–46.0)
Hemoglobin: 13 g/dL (ref 12.0–15.0)
MCH: 29.4 pg (ref 26.0–34.0)
MCHC: 31.9 g/dL (ref 30.0–36.0)
MCV: 92.1 fL (ref 78.0–100.0)
Platelets: 332 10*3/uL (ref 150–400)
RBC: 4.42 MIL/uL (ref 3.87–5.11)
RDW: 14.6 % (ref 11.5–15.5)
WBC: 8.4 10*3/uL (ref 4.0–10.5)

## 2014-07-27 LAB — TROPONIN I: Troponin I: <0.03 ng/mL (ref <0.031)

## 2014-07-27 NOTE — ED Provider Notes (Addendum)
CSN: [id number]     Arrival date & time 07/27/14  1445 History   First MD Initiated Contact with Patient 07/27/14 1601     Chief Complaint  Patient presents with  . Chest Pain     (Consider location/radiation/quality/duration/timing/severity/associated sxs/prior Treatment) HPI   Tammie West is a 81 y.o. female presents for evaluation of chest pain. Chest pain occurred greater than 48 hours ago, and lasted about 8 hours. The chest pain was mid anterior chest. It was felt as a pressure sensation which radiated to her jaw, bilaterally. The discomfort resolved spontaneously. It has recurred. She has never had previously. She did have some different chest pain one year ago which was evaluated with cardiac testing and cardiology evaluation. There was no diagnostic entity found and there is no changes in her treatment at that time.   Past Medical History  Diagnosis Date  . Diabetes mellitus without complication   . Hypertension   . Crohn's disease   . Insomnia   . Hypothyroidism   . Goiter   . Osteoporosis   . Hyperlipidemia   . Depression   . Carpal tunnel syndrome   . Renal failure   . GERD (gastroesophageal reflux disease)   . Osteoarthritis   . Trochanteric bursitis   . Migraine    Past Surgical History  Procedure Laterality Date  . Abdominal hysterectomy    . Appendectomy     Family History  Problem Relation Age of Onset  . Emphysema Father   . CAD Brother   . Heart disease Brother    History  Substance Use Topics  . Smoking status: Current Some Day Smoker -- 0.25 packs/day    Types: Cigarettes  . Smokeless tobacco: Not on file  . Alcohol Use: No   OB History    No data available     Review of Systems    Allergies  Byetta 10 mcg pen; Codeine; Erythromycin; Metformin and related; and Penicillins  Home Medications   Prior to Admission medications   Medication Sig Start Date End Date Taking? Authorizing Provider  amitriptyline (ELAVIL) 25 MG  tablet Take 25 mg by mouth at bedtime.    Historical Provider, MD  clidinium-chlordiazePOXIDE (LIBRAX) 2.5-5 MG per capsule Take 1 capsule by mouth every 4 (four) hours as needed (anxiety).     Historical Provider, MD  DULoxetine (CYMBALTA) 30 MG capsule Take 1 capsule by mouth daily. 02/15/14   Historical Provider, MD  HUMALOG KWIKPEN 100 UNIT/ML KiwkPen Inject 8-10 Units into the skin daily after supper.  12/03/12   Historical Provider, MD  insulin glargine (LANTUS) 100 UNIT/ML injection Inject 10 Units into the skin every morning.     Historical Provider, MD  levofloxacin (LEVAQUIN) 500 MG tablet Take 1 tablet by mouth 2 (two) times daily. For 5 day 04/03/14   Historical Provider, MD  levothyroxine (SYNTHROID, LEVOTHROID) 75 MCG tablet Take 1 tablet by mouth daily. 03/15/14   Historical Provider, MD  losartan (COZAAR) 100 MG tablet Take 100 mg by mouth daily.    Historical Provider, MD  mesalamine (LIALDA) 1.2 G EC tablet Take 2.4 g by mouth daily with breakfast.     Historical Provider, MD  mupirocin ointment (BACTROBAN) 2 % Apply 1 application topically 2 (two) times daily. 04/10/14   Historical Provider, MD  Omeprazole-Sodium Bicarbonate (ZEGERID OTC PO) Take 1 capsule by mouth daily.    Historical Provider, MD  oxyCODONE (ROXICODONE) 15 MG immediate release tablet Take 15 mg by mouth every  4 (four) hours as needed for pain.    Historical Provider, MD  Probiotic Product (ALIGN PO) Take 1 tablet by mouth every morning.    Historical Provider, MD  simvastatin (ZOCOR) 10 MG tablet Take 10 mg by mouth every evening.  02/09/13   Historical Provider, MD  sitaGLIPtin (JANUVIA) 50 MG tablet Take 50 mg by mouth daily.    Historical Provider, MD   BP 154/71 mmHg  Pulse 70  Temp(Src) 98.5 F (36.9 C) (Oral)  Resp 17  Ht 5\' 4"  (1.626 m)  Wt 160 lb (72.576 kg)  BMI 27.45 kg/m2  SpO2 98% Physical Exam  ED Course  Procedures (including critical care time) Labs Review Labs Reviewed  COMPREHENSIVE  METABOLIC PANEL - Abnormal; Notable for the following:    Glucose, Bld 255 (*)    BUN 43 (*)    Creatinine, Ser 1.33 (*)    Alkaline Phosphatase 129 (*)    GFR calc non Af Amer 37 (*)    GFR calc Af Amer 43 (*)    All other components within normal limits  TROPONIN I  CBC    Imaging Review Dg Chest 2 View  07/27/2014   CLINICAL DATA:  Chest pain for 2 days.  EXAM: CHEST  2 VIEW  COMPARISON:  01/10/2012.  FINDINGS: Moderate hyperinflation. Normal cardiomediastinal silhouette. Clear lung fields. Osteopenia. Similar appearance to priors.  IMPRESSION: No active cardiopulmonary disease.   Electronically Signed   By: Staci Righter M.D.   On: 07/27/2014 15:30     EKG Interpretation   Date/Time:  Thursday July 27 2014 14:51:56 EDT Ventricular Rate:  83 PR Interval:  202 QRS Duration: 86 QT Interval:  378 QTC Calculation: 444 R Axis:   -49 Text Interpretation:  Normal sinus rhythm Low voltage QRS Left anterior  fascicular block Cannot rule out Anterior infarct , age undetermined  Abnormal ECG No old tracing to compare Confirmed by [facility]  MD, Carla Rashad  [phone]) on 07/27/2014 4:16:14 PM      MDM   Final diagnoses:  Chest pain, unspecified chest pain type    Unspecified chest pain, with low risk, heart score, 3. Doubt ACS, PE, pneumonia, or impending vascular collapse.  Nursing Notes Reviewed/ Care Coordinated Applicable Imaging Reviewed Interpretation of Laboratory Data incorporated into ED treatment  The patient appears reasonably screened and/or stabilized for discharge and I doubt any other medical condition or other [facility] requiring further screening, evaluation, or treatment in the ED at this time prior to discharge.  Plan: Home Medications- usual; Home Treatments- rest; return here if the recommended treatment, does not improve the symptoms; Recommended follow up- PCP 1 week     Daleen Bo, MD 07/27/14 1853  Daleen Bo, MD 08/04/14 [id number]  Daleen Bo,  MD 08/17/14 1217

## 2014-07-27 NOTE — ED Notes (Signed)
Pt c/o 1 episode of chest pressure on Tuesday.  Called pcp today who stated to come to ED.  Denies pain or sob at this time.

## 2014-07-27 NOTE — Discharge Instructions (Signed)

## 2014-07-27 NOTE — ED Notes (Signed)
During lab draw, did not obtain enough blood for CBC test. Will need to be drawn by phlebotomy or RN

## 2014-09-11 ENCOUNTER — Encounter: Payer: Self-pay | Admitting: Physician Assistant

## 2014-09-11 ENCOUNTER — Ambulatory Visit (INDEPENDENT_AMBULATORY_CARE_PROVIDER_SITE_OTHER): Payer: Medicare Other | Admitting: Physician Assistant

## 2014-09-11 VITALS — BP 150/60 | HR 86 | Ht 64.0 in | Wt 162.0 lb

## 2014-09-11 DIAGNOSIS — E785 Hyperlipidemia, unspecified: Secondary | ICD-10-CM | POA: Diagnosis not present

## 2014-09-11 DIAGNOSIS — R079 Chest pain, unspecified: Secondary | ICD-10-CM | POA: Diagnosis not present

## 2014-09-11 DIAGNOSIS — I10 Essential (primary) hypertension: Secondary | ICD-10-CM | POA: Diagnosis not present

## 2014-09-11 DIAGNOSIS — Z720 Tobacco use: Secondary | ICD-10-CM | POA: Diagnosis not present

## 2014-09-11 MED ORDER — NITROGLYCERIN 0.4 MG SL SUBL: 0.4000 mg | SUBLINGUAL_TABLET | SUBLINGUAL | Status: DC | PRN

## 2014-09-11 NOTE — Progress Notes (Signed)
Cardiology Office Note   Date:  09/11/2014   ID:  Tammie West, DOB [birth year], MRN [id number]  PCP:  Lottie Dawson, MD  Cardiologist:  Dr. Radford Pax  Chief Complaint:chest pain    History of Present Illness: Tammie West is a 81 y.o. female who presents for follow-up with chest pain.she saw Dr. Radford Pax in 01/2013 for chest pain and underwent Lexi scan Myoview that was normal and 2-D echo that showed normal LV function. She is a diabetic, has hypertension, hyperlipidemia and ongoing tobacco abuse.  Patient went to the emergency room in June for recurrent chest pressure. She waited 48 hours before she went and her enzymes were negative and EKG unchanged. She describes the pain as a severe pressure squeezing tightness radiating to her jaw and her back associated with shortness of breath. Pain lasted 2-3 hours. This occurred twice in June. She is not active because of chronic back pain and is unable to do her own housework.    Past Medical History  Diagnosis Date  . Diabetes mellitus without complication   . Hypertension   . Crohn's disease   . Insomnia   . Hypothyroidism   . Goiter   . Osteoporosis   . Hyperlipidemia   . Depression   . Carpal tunnel syndrome   . Renal failure   . GERD (gastroesophageal reflux disease)   . Osteoarthritis   . Trochanteric bursitis   . Migraine     Past Surgical History  Procedure Laterality Date  . Abdominal hysterectomy    . Appendectomy       Current Outpatient Prescriptions  Medication Sig Dispense Refill  . amitriptyline (ELAVIL) 25 MG tablet Take 25 mg by mouth at bedtime.    . clidinium-chlordiazePOXIDE (LIBRAX) 2.5-5 MG per capsule Take 1 capsule by mouth every 4 (four) hours as needed (anxiety).     . DULoxetine (CYMBALTA) 30 MG capsule Take 1 capsule by mouth daily.    Marland Kitchen HUMALOG KWIKPEN 100 UNIT/ML KiwkPen Inject 8-10 Units into the skin daily after supper.     . hydrochlorothiazide (HYDRODIURIL) 25 MG  tablet Take 25 mg by mouth daily.    . insulin glargine (LANTUS) 100 UNIT/ML injection Inject 12 Units into the skin every morning.     Marland Kitchen levothyroxine (SYNTHROID, LEVOTHROID) 75 MCG tablet Take 1 tablet by mouth daily.    Marland Kitchen losartan (COZAAR) 100 MG tablet Take 100 mg by mouth daily.    . mesalamine (LIALDA) 1.2 G EC tablet Take 2.4 g by mouth daily with breakfast.     . Omeprazole-Sodium Bicarbonate (ZEGERID OTC) 20-1100 MG CAPS capsule Take 1 capsule by mouth daily before breakfast.    . Probiotic Product (ALIGN PO) Take 1 tablet by mouth every morning.    . simvastatin (ZOCOR) 10 MG tablet Take 10 mg by mouth every evening.     . sitaGLIPtin (JANUVIA) 50 MG tablet Take 50 mg by mouth daily.    Marland Kitchen terconazole (TERAZOL 7) 0.4 % vaginal cream Place 1 applicator vaginally as needed (YEAST INFECTION).      No current facility-administered medications for this visit.    Allergies:   Byetta 10 mcg pen; Codeine; Erythromycin; Metformin and related; and Penicillins    Social History:  The patient  reports that she has been smoking Cigarettes.  She has been smoking about 0.25 packs per day. She does not have any smokeless tobacco history on file. She reports that she does not drink alcohol or use illicit drugs.  Family History:  The patient's    family history includes CAD in her brother; Emphysema in her father; Heart disease in her brother. There is no history of Heart attack or Stroke.    ROS:  Please see the history of present illness.   Otherwise, review of systems are positive for constipation, easy bruising, headaches.   All other systems are reviewed and negative.    PHYSICAL EXAM: VS:  BP 150/60 mmHg  Pulse 86  Ht 5\' 4"  (1.626 m)  Wt 162 lb (73.483 kg)  BMI 27.79 kg/m2 , BMI Body mass index is 27.79 kg/(m^2). GEN: Well nourished, well developed, in no acute distress Neck: no JVD, HJR, carotid bruits, or masses Cardiac: RRR; positive S4,no murmurs, rubs, thrill or heave,   Respiratory:  Decreased breath sounds but clear to auscultation bilaterally, normal work of breathing GI: soft, nontender, nondistended, + BS MS: no deformity or atrophy Extremities: without cyanosis, clubbing, edema, good distal pulses bilaterally.  Skin: warm and dry, no rash Neuro:  Strength and sensation are intact    EKG:  EKG is ordered today. The ekg ordered today demonstrates normal sinus rhythm poor R wave progression anteriorly, no acute change  Recent Labs: 07/27/2014: ALT 26; BUN 43*; Creatinine, Ser 1.33*; Hemoglobin 13.0; Platelets 332; Potassium 4.4; Sodium 136    Lipid Panel No results found for: CHOL, TRIG, HDL, CHOLHDL, VLDL, LDLCALC, LDLDIRECT    Wt Readings from Last 3 Encounters:  09/11/14 162 lb (73.483 kg)  07/27/14 160 lb (72.576 kg)  02/28/13 166 lb (75.297 kg)      Other studies Reviewed: Additional studies/ records that were reviewed today include and review of the records demonstrates:  2-D echo 03/03/13 Study Conclusions  - Left ventricle: The cavity size was normal. Wall thickness   was normal. Systolic function was normal. The estimated   ejection fraction was in the range of 55% to 60%. - Mitral valve: Mild regurgitation. - Left atrium: The atrium was mildly dilated. - Atrial septum: No defect or patent foramen ovale was   identified.  Lexi scan Myoview 03/01/13 Overall Impression:  Normal stress nuclear study.  LV Ejection Fraction: 81%.  LV Wall Motion:  NL LV Function; NL Wall Motion   ASSESSMENT AND PLAN: Chest pain Chest pain is worrisome for ischemia. Recommend repeat Lexi scan Myoview to rule out ischemia. Patient has multiple risk factors for CAD including diabetes, hypertension, hyperlipidemia and ongoing tobacco abuse. Follow-up with Dr. Radford Pax.  Hyperlipidemia Patient is on Zocor.  Hypertension Blood pressure is up a little today. Patient is very nervous.  Tobacco abuse Patient continues to smoke cigarettes. Discussed  the importance of smoking cessation.     Tammie Boast, PA-C  09/11/2014 12:24 PM    [facility] [place], [place], [place]  [address] Phone: [phone]; Fax: [phone]

## 2014-09-11 NOTE — Assessment & Plan Note (Signed)
Chest pain is worrisome for ischemia. Recommend repeat Lexi scan Myoview to rule out ischemia. Patient has multiple risk factors for CAD including diabetes, hypertension, hyperlipidemia and ongoing tobacco abuse. Follow-up with Dr. Radford Pax.

## 2014-09-11 NOTE — Patient Instructions (Signed)
Medication Instructions:  Sent in prescription for Nitroglycerin (0.4 mg ) to patient's requested pharmacy, will go over instructions verbally  Labwork: -None  Testing/Procedures: Your physician has requested that you have a lexiscan myoview. For further information please visit [email or web address]. Please follow instruction sheet, as given.    Follow-Up: Your physician recommends that you keep your scheduled  follow-up appointment in: October with Dr. Radford Pax   Any Other Special Instructions Will Be Listed Below (If Applicable).

## 2014-09-11 NOTE — Assessment & Plan Note (Signed)
Patient is on Zocor.

## 2014-09-11 NOTE — Assessment & Plan Note (Signed)
Patient continues to smoke cigarettes. Discussed the importance of smoking cessation.

## 2014-09-11 NOTE — Assessment & Plan Note (Signed)
Blood pressure is up a little today. Patient is very nervous.

## 2014-09-12 ENCOUNTER — Telehealth (HOSPITAL_COMMUNITY): Payer: Self-pay | Admitting: *Deleted

## 2014-09-12 NOTE — Telephone Encounter (Signed)
Patient given detailed instructions per Myocardial Perfusion Study Information Sheet for test on 09/15/14 at 0815. Patient Notified to arrive 15 minutes early, and that it is imperative to arrive on time for appointment to keep from having the test rescheduled. Patient verbalized understanding. Deari Sessler, Ranae Palms

## 2014-09-15 ENCOUNTER — Ambulatory Visit (HOSPITAL_COMMUNITY): Payer: Medicare Other | Attending: Cardiology

## 2014-09-15 DIAGNOSIS — R079 Chest pain, unspecified: Secondary | ICD-10-CM | POA: Diagnosis present

## 2014-09-15 DIAGNOSIS — I10 Essential (primary) hypertension: Secondary | ICD-10-CM | POA: Insufficient documentation

## 2014-09-15 DIAGNOSIS — E119 Type 2 diabetes mellitus without complications: Secondary | ICD-10-CM | POA: Insufficient documentation

## 2014-09-15 LAB — MYOCARDIAL PERFUSION IMAGING
CHL CUP NUCLEAR SDS: 0
CHL CUP RESTING HR STRESS: 67 {beats}/min
LHR: 0.26
LV dias vol: 62 mL
LV sys vol: 17 mL
Peak HR: 77 {beats}/min
SRS: 0
SSS: 0
TID: 0.92

## 2014-09-15 MED ORDER — AMINOPHYLLINE 25 MG/ML IV SOLN
150.0000 mg | Freq: Once | INTRAVENOUS | Status: AC
Start: 2014-09-15 — End: 2014-09-15
  Administered 2014-09-15: 150 mg via INTRAVENOUS

## 2014-09-15 MED ORDER — TECHNETIUM TC 99M SESTAMIBI GENERIC - CARDIOLITE
9.7000 | Freq: Once | INTRAVENOUS | Status: AC | PRN
  Administered 2014-09-15: 10 via INTRAVENOUS

## 2014-09-15 MED ORDER — TECHNETIUM TC 99M SESTAMIBI GENERIC - CARDIOLITE
31.4000 | Freq: Once | INTRAVENOUS | Status: AC | PRN
  Administered 2014-09-15: 31 via INTRAVENOUS

## 2014-09-15 MED ORDER — REGADENOSON 0.4 MG/5ML IV SOLN
0.4000 mg | Freq: Once | INTRAVENOUS | Status: AC
  Administered 2014-09-15: 0.4 mg via INTRAVENOUS

## 2014-09-20 ENCOUNTER — Telehealth: Payer: Self-pay | Admitting: *Deleted

## 2014-09-20 NOTE — Telephone Encounter (Signed)
-----   Message from Imogene Burn, PA-C sent at 09/18/2014  7:49 AM EDT ----- Normal stress myoview

## 2014-10-04 ENCOUNTER — Encounter: Payer: Self-pay | Admitting: Cardiology

## 2014-11-02 ENCOUNTER — Ambulatory Visit: Payer: Medicare Other | Admitting: Cardiology

## 2014-11-15 ENCOUNTER — Ambulatory Visit (INDEPENDENT_AMBULATORY_CARE_PROVIDER_SITE_OTHER): Payer: Medicare Other | Admitting: Cardiology

## 2014-11-15 ENCOUNTER — Encounter: Payer: Self-pay | Admitting: Cardiology

## 2014-11-15 VITALS — BP 132/58 | HR 73 | Ht 64.0 in | Wt 167.0 lb

## 2014-11-15 DIAGNOSIS — I209 Angina pectoris, unspecified: Secondary | ICD-10-CM | POA: Diagnosis not present

## 2014-11-15 DIAGNOSIS — E785 Hyperlipidemia, unspecified: Secondary | ICD-10-CM | POA: Diagnosis not present

## 2014-11-15 DIAGNOSIS — R079 Chest pain, unspecified: Secondary | ICD-10-CM

## 2014-11-15 DIAGNOSIS — I10 Essential (primary) hypertension: Secondary | ICD-10-CM

## 2014-11-15 LAB — BASIC METABOLIC PANEL
BUN: 31 mg/dL — ABNORMAL HIGH (ref 7–25)
CO2: 25 mmol/L (ref 20–31)
CREATININE: 1.3 mg/dL — AB (ref 0.60–0.88)
Calcium: 9.2 mg/dL (ref 8.6–10.4)
Chloride: 104 mmol/L (ref 98–110)
Glucose, Bld: 179 mg/dL — ABNORMAL HIGH (ref 65–99)
Potassium: 4.6 mmol/L (ref 3.5–5.3)
Sodium: 137 mmol/L (ref 135–146)

## 2014-11-15 NOTE — Patient Instructions (Signed)
Medication Instructions:  Your physician recommends that you continue on your current medications as directed. Please refer to the Current Medication list given to you today.   Labwork: TODAY: BMET  Testing/Procedures: Dr. Radford Pax recommends you have a CORONARY CTA.  Follow-Up: Your physician recommends that you schedule a follow-up appointment AS NEEDED with Dr. Radford Pax.  Any Other Special Instructions Will Be Listed Below (If Applicable).

## 2014-11-15 NOTE — Progress Notes (Signed)
Cardiology Office Note   Date:  11/15/2014   ID:  Tammie West, DOB March 11, 1932, MRN [id number]  PCP:  Lottie Dawson, MD    Chief Complaint  Patient presents with  . Chest Pain  . Hypertension      History of Present Illness: Tammie West is a 81 y.o. female with a history of HTN and dyslipidemia as well as occasional tobacco use who presents today for followup of chest pressure.She had a normal nuclear stress test.  SHe has not had any CP until about 3 nights ago and she had CP and took a SL NTG and resolved.  She describes it as pressure that radiated into her jaw and neck.  This occurred while she was fixing dinner.  She denied any diaphoresis or nausea or SOB.  She denies any DOE, LE edema, dizziness, palpitations or syncope.     Past Medical History  Diagnosis Date  . Diabetes mellitus without complication ([place])   . Hypertension   . Crohn's disease ([place])   . Insomnia   . Hypothyroidism   . Goiter   . Osteoporosis   . Hyperlipidemia   . Depression   . Carpal tunnel syndrome   . Renal failure   . GERD (gastroesophageal reflux disease)   . Osteoarthritis   . Trochanteric bursitis   . Migraine     Past Surgical History  Procedure Laterality Date  . Abdominal hysterectomy    . Appendectomy       Current Outpatient Prescriptions  Medication Sig Dispense Refill  . amitriptyline (ELAVIL) 25 MG tablet Take 25 mg by mouth at bedtime.    Marland Kitchen aspirin 81 MG tablet Take 81 mg by mouth daily.    Marland Kitchen HUMALOG KWIKPEN 100 UNIT/ML KiwkPen Inject 8-10 Units into the skin daily after supper.     . hydrochlorothiazide (HYDRODIURIL) 25 MG tablet Take 25 mg by mouth daily.    . insulin glargine (LANTUS) 100 UNIT/ML injection Inject 12 Units into the skin every morning.     Marland Kitchen levothyroxine (SYNTHROID, LEVOTHROID) 88 MCG tablet Take 88 mcg by mouth daily before breakfast.    . losartan (COZAAR) 100 MG tablet Take 1 tablet by mouth  daily.    Earney Navy Bicarbonate (ZEGERID OTC) 20-1100 MG CAPS capsule Take 1 capsule by mouth daily before breakfast.    . oxyCODONE-acetaminophen (PERCOCET) 7.5-325 MG tablet Take 1 tablet by mouth 4 (four) times daily as needed.    . Probiotic Product (ALIGN PO) Take 1 tablet by mouth every morning.    . simvastatin (ZOCOR) 10 MG tablet Take 10 mg by mouth every evening.     . sitaGLIPtin (JANUVIA) 50 MG tablet Take 50 mg by mouth daily.    Marland Kitchen tiZANidine (ZANAFLEX) 2 MG tablet Take 1 tablet by mouth 2 (two) times daily.    . nitroGLYCERIN (NITROSTAT) 0.4 MG SL tablet Place 1 tablet (0.4 mg total) under the tongue every 5 (five) minutes as needed for chest pain. (Patient not taking: Reported on 11/15/2014) 25 tablet 3   No current facility-administered medications for this visit.    Allergies:   Byetta 10 mcg pen; Codeine; Erythromycin; Metformin and related; and Penicillins    Social History:  The patient  reports that she has been smoking Cigarettes.  She has been smoking about 0.25 packs per day. She does not have any smokeless tobacco history on  file. She reports that she does not drink alcohol or use illicit drugs.   Family History:  The patient's family history includes CAD in her brother; Emphysema in her father; Heart disease in her brother. There is no history of Heart attack or Stroke.    ROS:  Please see the history of present illness.   Otherwise, review of systems are positive for none.   All other systems are reviewed and negative.    PHYSICAL EXAM: VS:  BP 132/58 mmHg  Pulse 73  Ht 5\' 4"  (1.626 m)  Wt 167 lb (75.751 kg)  BMI 28.65 kg/m2  SpO2 98% , BMI Body mass index is 28.65 kg/(m^2). GEN: Well nourished, well developed, in no acute distress HEENT: normal Neck: no JVD, carotid bruits, or masses Cardiac: RRR; no murmurs, rubs, or gallops,no edema  Respiratory:  clear to auscultation bilaterally, normal work of breathing GI: soft, nontender, nondistended, +  BS MS: no deformity or atrophy Skin: warm and dry, no rash Neuro:  Strength and sensation are intact Psych: euthymic mood, full affect   EKG:  EKG is not ordered today.    Recent Labs: 07/27/2014: ALT 26; BUN 43*; Creatinine, Ser 1.33*; Hemoglobin 13.0; Platelets 332; Potassium 4.4; Sodium 136    Lipid Panel No results found for: CHOL, TRIG, HDL, CHOLHDL, VLDL, LDLCALC, LDLDIRECT    Wt Readings from Last 3 Encounters:  11/15/14 167 lb (75.751 kg)  09/15/14 162 lb (73.483 kg)  09/11/14 162 lb (73.483 kg)       ASSESSMENT AND PLAN: Chest pain Chest pain is worrisome for ischemia but she also has GERD so this could be esophageal spasm.  . Recommend coronary CTA with morph and calcium score to evaluate further.   Patient has multiple risk factors for CAD including diabetes, hypertension, hyperlipidemia and ongoing tobacco abuse.   Hyperlipidemia Patient is on Zocor.  Hypertension Well controlled on HCTZ and Losartan  Tobacco abuse Patient continues to smoke cigarettes. Discussed the importance of smoking cessation.   Current medicines are reviewed at length with the patient today.  The patient does not have concerns regarding medicines.  The following changes have been made:  no change  Labs/ tests ordered today: See above Assessment and Plan  Orders Placed This Encounter  Procedures  . CT Coronary Morp W/Cta Cor W/Score W/Ca W/Cm &/Or Wo/Cm  . Basic metabolic panel     Disposition:   FU with me in PRN pending results of coronary CTA    Signed, Sueanne Margarita, MD  11/15/2014 2:52 PM    [facility] [place], [place], [place]  [address] Phone: [phone]; Fax: [phone]

## 2014-11-28 ENCOUNTER — Encounter: Payer: Self-pay | Admitting: Cardiology

## 2014-11-28 DIAGNOSIS — I2510 Atherosclerotic heart disease of native coronary artery without angina pectoris: Secondary | ICD-10-CM | POA: Insufficient documentation

## 2014-11-28 HISTORY — DX: Atherosclerotic heart disease of native coronary artery without angina pectoris: I25.10

## 2014-12-04 ENCOUNTER — Ambulatory Visit (HOSPITAL_COMMUNITY)
Admission: RE | Admit: 2014-12-04 | Discharge: 2014-12-04 | Disposition: A | Discharge status: Home / Self Care | Payer: Medicare Other | Source: Ambulatory Visit | Attending: Cardiology | Admitting: Cardiology

## 2014-12-04 DIAGNOSIS — R079 Chest pain, unspecified: Secondary | ICD-10-CM

## 2014-12-04 DIAGNOSIS — I209 Angina pectoris, unspecified: Secondary | ICD-10-CM

## 2014-12-04 MED ORDER — IOHEXOL 350 MG/ML SOLN
80.0000 mL | Freq: Once | INTRAVENOUS | Status: AC | PRN
  Administered 2014-12-04: 100 mL via INTRAVENOUS

## 2014-12-04 MED ORDER — NITROGLYCERIN 0.4 MG SL SUBL: 0.4000 mg | SUBLINGUAL_TABLET | SUBLINGUAL | Status: DC | PRN

## 2014-12-04 MED ORDER — METOPROLOL TARTRATE 1 MG/ML IV SOLN: 5.0000 mg | Freq: Once | INTRAVENOUS | Status: DC

## 2014-12-04 MED ORDER — NITROGLYCERIN 0.4 MG SL SUBL
SUBLINGUAL_TABLET | SUBLINGUAL | Status: AC
  Administered 2014-12-04: 0.4 mg via ORAL
  Filled 2014-12-04: qty 1

## 2014-12-04 MED ORDER — METOPROLOL TARTRATE 1 MG/ML IV SOLN
INTRAVENOUS | Status: AC
  Filled 2014-12-04: qty 10

## 2014-12-04 MED ORDER — METOPROLOL TARTRATE 1 MG/ML IV SOLN
5.0000 mg | INTRAVENOUS | Status: DC | PRN
  Administered 2014-12-04 (×2): 5 mg via INTRAVENOUS

## 2014-12-06 ENCOUNTER — Telehealth: Payer: Self-pay | Admitting: Cardiology

## 2014-12-06 NOTE — Telephone Encounter (Signed)
Informed patient she will be called with more information when Dr. Radford Pax releases the results of her CT scan. Patient agrees with treatment plan.

## 2014-12-06 NOTE — Telephone Encounter (Signed)
New problem   Pt want to know results of her CT scan she had done 11.7.16

## 2014-12-08 ENCOUNTER — Telehealth (HOSPITAL_COMMUNITY): Payer: Self-pay

## 2014-12-08 ENCOUNTER — Encounter (HOSPITAL_COMMUNITY): Payer: Self-pay

## 2014-12-08 ENCOUNTER — Other Ambulatory Visit: Payer: Self-pay | Admitting: Cardiology

## 2014-12-08 DIAGNOSIS — I208 Other forms of angina pectoris: Secondary | ICD-10-CM

## 2014-12-08 DIAGNOSIS — Z01812 Encounter for preprocedural laboratory examination: Secondary | ICD-10-CM

## 2014-12-08 DIAGNOSIS — R079 Chest pain, unspecified: Secondary | ICD-10-CM

## 2014-12-08 NOTE — Telephone Encounter (Signed)
-----   Message from Sueanne Margarita, MD sent at 12/07/2014 10:22 AM EST ----- Coronary CTA showed high calcium score with extensive plaque in the coronary tree with mild stenosis in the proximal vessels but the distal vessels were obscured.  Since she is having chest pain I would like to proceed with cardiac catheterization.  Please set up cath and have her hold HCTZ the day before cath.

## 2014-12-08 NOTE — Telephone Encounter (Signed)
Informed patient of results and verbal understanding expressed.   L heart cath scheduled 11/16 with Dr. Tamala Julian. Pre-procedure labs to be drawn 11/14.  Reviewed instructions with patient and she has no questions. Patient understands to pick up instruction letter at check in for labs.

## 2014-12-11 ENCOUNTER — Other Ambulatory Visit (HOSPITAL_COMMUNITY): Admission: RE | Admit: 2014-12-11 | Payer: Medicare Other | Source: Ambulatory Visit | Admitting: Cardiology

## 2014-12-11 ENCOUNTER — Other Ambulatory Visit (INDEPENDENT_AMBULATORY_CARE_PROVIDER_SITE_OTHER): Payer: Medicare Other | Admitting: *Deleted

## 2014-12-11 DIAGNOSIS — Z01812 Encounter for preprocedural laboratory examination: Secondary | ICD-10-CM

## 2014-12-11 DIAGNOSIS — R079 Chest pain, unspecified: Secondary | ICD-10-CM

## 2014-12-11 LAB — CBC WITH DIFFERENTIAL/PLATELET
Basophils Absolute: 0.1 10*3/uL (ref 0.0–0.1)
Basophils Relative: 1 % (ref 0–1)
EOS PCT: 2 % (ref 0–5)
Eosinophils Absolute: 0.2 10*3/uL (ref 0.0–0.7)
HEMATOCRIT: 37.7 % (ref 36.0–46.0)
Hemoglobin: 12.9 g/dL (ref 12.0–15.0)
LYMPHS ABS: 1.4 10*3/uL (ref 0.7–4.0)
LYMPHS PCT: 18 % (ref 12–46)
MCH: 30.6 pg (ref 26.0–34.0)
MCHC: 34.2 g/dL (ref 30.0–36.0)
MCV: 89.3 fL (ref 78.0–100.0)
MONO ABS: 0.7 10*3/uL (ref 0.1–1.0)
MONOS PCT: 9 % (ref 3–12)
MPV: 9.3 fL (ref 8.6–12.4)
Neutro Abs: 5.5 10*3/uL (ref 1.7–7.7)
Neutrophils Relative %: 70 % (ref 43–77)
Platelets: 332 10*3/uL (ref 150–400)
RBC: 4.22 MIL/uL (ref 3.87–5.11)
RDW: 13.3 % (ref 11.5–15.5)
WBC: 7.9 10*3/uL (ref 4.0–10.5)

## 2014-12-11 LAB — BASIC METABOLIC PANEL
BUN: 35 mg/dL — AB (ref 7–25)
CHLORIDE: 103 mmol/L (ref 98–110)
CO2: 17 mmol/L — ABNORMAL LOW (ref 20–31)
Calcium: 9.7 mg/dL (ref 8.6–10.4)
Creat: 1.26 mg/dL — ABNORMAL HIGH (ref 0.60–0.88)
GLUCOSE: 207 mg/dL — AB (ref 65–99)
POTASSIUM: 4.9 mmol/L (ref 3.5–5.3)
Sodium: 136 mmol/L (ref 135–146)

## 2014-12-11 NOTE — Addendum Note (Signed)
Addended by: Harland German A on: 12/11/2014 01:47 PM   Modules accepted: Orders

## 2014-12-12 LAB — PROTIME-INR
INR: 0.96 (ref <1.50)
Prothrombin Time: 12.9 seconds (ref 11.6–15.2)

## 2014-12-12 LAB — APTT: APTT: 29 s (ref 24–37)

## 2014-12-13 ENCOUNTER — Ambulatory Visit (HOSPITAL_COMMUNITY)
Admission: RE | Admit: 2014-12-13 | Discharge: 2014-12-13 | Disposition: A | Discharge status: Home / Self Care | Payer: Medicare Other | Source: Ambulatory Visit | Attending: Interventional Cardiology | Admitting: Interventional Cardiology

## 2014-12-13 ENCOUNTER — Encounter (HOSPITAL_COMMUNITY)
Admission: RE | Disposition: A | Discharge status: Home / Self Care | Payer: Self-pay | Source: Ambulatory Visit | Attending: Interventional Cardiology

## 2014-12-13 DIAGNOSIS — I10 Essential (primary) hypertension: Secondary | ICD-10-CM | POA: Insufficient documentation

## 2014-12-13 DIAGNOSIS — E785 Hyperlipidemia, unspecified: Secondary | ICD-10-CM | POA: Diagnosis not present

## 2014-12-13 DIAGNOSIS — Z794 Long term (current) use of insulin: Secondary | ICD-10-CM | POA: Diagnosis not present

## 2014-12-13 DIAGNOSIS — F329 Major depressive disorder, single episode, unspecified: Secondary | ICD-10-CM | POA: Insufficient documentation

## 2014-12-13 DIAGNOSIS — K5090 Crohn's disease, unspecified, without complications: Secondary | ICD-10-CM | POA: Insufficient documentation

## 2014-12-13 DIAGNOSIS — K219 Gastro-esophageal reflux disease without esophagitis: Secondary | ICD-10-CM | POA: Insufficient documentation

## 2014-12-13 DIAGNOSIS — I2089 Other forms of angina pectoris: Secondary | ICD-10-CM | POA: Insufficient documentation

## 2014-12-13 DIAGNOSIS — Z7982 Long term (current) use of aspirin: Secondary | ICD-10-CM | POA: Insufficient documentation

## 2014-12-13 DIAGNOSIS — I208 Other forms of angina pectoris: Secondary | ICD-10-CM

## 2014-12-13 DIAGNOSIS — I25118 Atherosclerotic heart disease of native coronary artery with other forms of angina pectoris: Secondary | ICD-10-CM

## 2014-12-13 DIAGNOSIS — M1990 Unspecified osteoarthritis, unspecified site: Secondary | ICD-10-CM | POA: Diagnosis not present

## 2014-12-13 DIAGNOSIS — I2510 Atherosclerotic heart disease of native coronary artery without angina pectoris: Secondary | ICD-10-CM | POA: Diagnosis not present

## 2014-12-13 DIAGNOSIS — F17210 Nicotine dependence, cigarettes, uncomplicated: Secondary | ICD-10-CM | POA: Diagnosis not present

## 2014-12-13 DIAGNOSIS — M810 Age-related osteoporosis without current pathological fracture: Secondary | ICD-10-CM | POA: Diagnosis not present

## 2014-12-13 DIAGNOSIS — E039 Hypothyroidism, unspecified: Secondary | ICD-10-CM | POA: Insufficient documentation

## 2014-12-13 DIAGNOSIS — Z880 Allergy status to penicillin: Secondary | ICD-10-CM | POA: Diagnosis not present

## 2014-12-13 DIAGNOSIS — R079 Chest pain, unspecified: Secondary | ICD-10-CM | POA: Diagnosis present

## 2014-12-13 DIAGNOSIS — E119 Type 2 diabetes mellitus without complications: Secondary | ICD-10-CM | POA: Insufficient documentation

## 2014-12-13 DIAGNOSIS — G43909 Migraine, unspecified, not intractable, without status migrainosus: Secondary | ICD-10-CM | POA: Diagnosis not present

## 2014-12-13 HISTORY — PX: CARDIAC CATHETERIZATION: SHX172

## 2014-12-13 LAB — BASIC METABOLIC PANEL
ANION GAP: 9 (ref 5–15)
BUN: 32 mg/dL — ABNORMAL HIGH (ref 6–20)
CALCIUM: 9.4 mg/dL (ref 8.9–10.3)
CO2: 26 mmol/L (ref 22–32)
Chloride: 105 mmol/L (ref 101–111)
Creatinine, Ser: 1.36 mg/dL — ABNORMAL HIGH (ref 0.44–1.00)
GFR calc Af Amer: 41 mL/min — ABNORMAL LOW (ref >60)
GFR calc non Af Amer: 36 mL/min — ABNORMAL LOW (ref >60)
GLUCOSE: 161 mg/dL — AB (ref 65–99)
Potassium: 4.4 mmol/L (ref 3.5–5.1)
Sodium: 140 mmol/L (ref 135–145)

## 2014-12-13 LAB — GLUCOSE, CAPILLARY
Glucose-Capillary: 138 mg/dL — ABNORMAL HIGH (ref 65–99)
Glucose-Capillary: 140 mg/dL — ABNORMAL HIGH (ref 65–99)
Glucose-Capillary: 110 mg/dL — ABNORMAL HIGH (ref 65–99)

## 2014-12-13 LAB — CBC
HEMATOCRIT: 37.5 % (ref 36.0–46.0)
HEMOGLOBIN: 12 g/dL (ref 12.0–15.0)
MCH: 29.8 pg (ref 26.0–34.0)
MCHC: 32 g/dL (ref 30.0–36.0)
MCV: 93.1 fL (ref 78.0–100.0)
Platelets: 277 10*3/uL (ref 150–400)
RBC: 4.03 MIL/uL (ref 3.87–5.11)
RDW: 13.1 % (ref 11.5–15.5)
WBC: 8 10*3/uL (ref 4.0–10.5)

## 2014-12-13 LAB — APTT: aPTT: 29 seconds (ref 24–37)

## 2014-12-13 LAB — PROTIME-INR
INR: 1.04 (ref 0.00–1.49)
PROTHROMBIN TIME: 13.8 s (ref 11.6–15.2)

## 2014-12-13 SURGERY — LEFT HEART CATH AND CORONARY ANGIOGRAPHY
Anesthesia: LOCAL

## 2014-12-13 MED ORDER — OXYCODONE-ACETAMINOPHEN 5-325 MG PO TABS
ORAL_TABLET | ORAL | Status: AC
  Administered 2014-12-13: 1
  Filled 2014-12-13: qty 1

## 2014-12-13 MED ORDER — MIDAZOLAM HCL 2 MG/2ML IJ SOLN
INTRAMUSCULAR | Status: AC
  Filled 2014-12-13: qty 2

## 2014-12-13 MED ORDER — SODIUM CHLORIDE 0.9 % IJ SOLN: 3.0000 mL | Freq: Two times a day (BID) | INTRAMUSCULAR | Status: DC

## 2014-12-13 MED ORDER — VERAPAMIL HCL 2.5 MG/ML IV SOLN
INTRAVENOUS | Status: AC
  Filled 2014-12-13: qty 2

## 2014-12-13 MED ORDER — SODIUM CHLORIDE 0.9 % IV SOLN: 250.0000 mL | INTRAVENOUS | Status: DC | PRN

## 2014-12-13 MED ORDER — OXYCODONE HCL 5 MG PO TABS: 5.0000 mg | ORAL_TABLET | Freq: Once | ORAL | Status: DC

## 2014-12-13 MED ORDER — SODIUM CHLORIDE 0.9 % WEIGHT BASED INFUSION
3.0000 mL/kg/h | INTRAVENOUS | Status: DC
  Administered 2014-12-13: 3 mL/kg/h via INTRAVENOUS

## 2014-12-13 MED ORDER — SODIUM CHLORIDE 0.9 % IJ SOLN: 3.0000 mL | INTRAMUSCULAR | Status: DC | PRN

## 2014-12-13 MED ORDER — OXYCODONE-ACETAMINOPHEN 5-325 MG PO TABS
1.0000 | ORAL_TABLET | Freq: Once | ORAL | Status: AC
  Administered 2014-12-13: 1 via ORAL

## 2014-12-13 MED ORDER — HEPARIN SODIUM (PORCINE) 1000 UNIT/ML IJ SOLN
INTRAMUSCULAR | Status: DC | PRN
  Administered 2014-12-13: 3500 [IU] via INTRAVENOUS

## 2014-12-13 MED ORDER — SODIUM CHLORIDE 0.9 % WEIGHT BASED INFUSION: 3.0000 mL/kg/h | INTRAVENOUS | Status: DC

## 2014-12-13 MED ORDER — FENTANYL CITRATE (PF) 100 MCG/2ML IJ SOLN
INTRAMUSCULAR | Status: AC
  Filled 2014-12-13: qty 2

## 2014-12-13 MED ORDER — ASPIRIN 81 MG PO CHEW: 81.0000 mg | CHEWABLE_TABLET | ORAL | Status: DC

## 2014-12-13 MED ORDER — VERAPAMIL HCL 2.5 MG/ML IV SOLN
INTRAVENOUS | Status: DC | PRN
  Administered 2014-12-13: 15:00:00 via INTRA_ARTERIAL

## 2014-12-13 MED ORDER — MIDAZOLAM HCL 2 MG/2ML IJ SOLN
INTRAMUSCULAR | Status: DC | PRN
  Administered 2014-12-13 (×2): 1 mg via INTRAVENOUS

## 2014-12-13 MED ORDER — LIDOCAINE HCL (PF) 1 % IJ SOLN
INTRAMUSCULAR | Status: DC | PRN
  Administered 2014-12-13: 15:00:00

## 2014-12-13 MED ORDER — SODIUM CHLORIDE 0.9 % WEIGHT BASED INFUSION
1.0000 mL/kg/h | INTRAVENOUS | Status: DC
  Administered 2014-12-13: 1 mL/kg/h via INTRAVENOUS

## 2014-12-13 MED ORDER — HEPARIN SODIUM (PORCINE) 1000 UNIT/ML IJ SOLN
INTRAMUSCULAR | Status: AC
  Filled 2014-12-13: qty 1

## 2014-12-13 MED ORDER — HEPARIN (PORCINE) IN NACL 2-0.9 UNIT/ML-% IJ SOLN
INTRAMUSCULAR | Status: AC
  Filled 2014-12-13: qty 1000

## 2014-12-13 MED ORDER — ACETAMINOPHEN 325 MG PO TABS: 650.0000 mg | ORAL_TABLET | ORAL | Status: DC | PRN

## 2014-12-13 MED ORDER — ASPIRIN 81 MG PO CHEW
CHEWABLE_TABLET | ORAL | Status: AC
  Filled 2014-12-13: qty 1

## 2014-12-13 MED ORDER — LIDOCAINE HCL (PF) 1 % IJ SOLN
INTRAMUSCULAR | Status: AC
  Filled 2014-12-13: qty 30

## 2014-12-13 MED ORDER — ONDANSETRON HCL 4 MG/2ML IJ SOLN: 4.0000 mg | Freq: Four times a day (QID) | INTRAMUSCULAR | Status: DC | PRN

## 2014-12-13 MED ORDER — FENTANYL CITRATE (PF) 100 MCG/2ML IJ SOLN
INTRAMUSCULAR | Status: DC | PRN
  Administered 2014-12-13: 50 ug via INTRAVENOUS

## 2014-12-13 SURGICAL SUPPLY — 10 items
CATH INFINITI 5 FR JL3.5 (CATHETERS) ×2 IMPLANT
CATH INFINITI JR4 5F (CATHETERS) ×2 IMPLANT
DEVICE RAD COMP TR BAND LRG (VASCULAR PRODUCTS) ×2 IMPLANT
GLIDESHEATH SLEND A-KIT 6F 22G (SHEATH) ×2 IMPLANT
KIT HEART LEFT (KITS) ×2 IMPLANT
PACK CARDIAC CATHETERIZATION (CUSTOM PROCEDURE TRAY) ×2 IMPLANT
TRANSDUCER W/STOPCOCK (MISCELLANEOUS) ×2 IMPLANT
TUBING CIL FLEX 10 FLL-RA (TUBING) ×2 IMPLANT
WIRE HI TORQ VERSACORE-J 145CM (WIRE) ×2 IMPLANT
WIRE SAFE-T 1.5MM-J .035X260CM (WIRE) ×2 IMPLANT

## 2014-12-13 NOTE — H&P (View-Only) (Signed)
Cardiology Office Note   Date:  11/15/2014   ID:  Tammie West, DOB 02-18-1932, MRN [id number]  PCP:  Lottie Dawson, MD    Chief Complaint  Patient presents with  . Chest Pain  . Hypertension      History of Present Illness: Tammie West is a 81 y.o. female with a history of HTN and dyslipidemia as well as occasional tobacco use who presents today for followup of chest pressure.She had a normal nuclear stress test.  SHe has not had any CP until about 3 nights ago and she had CP and took a SL NTG and resolved.  She describes it as pressure that radiated into her jaw and neck.  This occurred while she was fixing dinner.  She denied any diaphoresis or nausea or SOB.  She denies any DOE, LE edema, dizziness, palpitations or syncope.     Past Medical History  Diagnosis Date  . Diabetes mellitus without complication ([place])   . Hypertension   . Crohn's disease ([place])   . Insomnia   . Hypothyroidism   . Goiter   . Osteoporosis   . Hyperlipidemia   . Depression   . Carpal tunnel syndrome   . Renal failure   . GERD (gastroesophageal reflux disease)   . Osteoarthritis   . Trochanteric bursitis   . Migraine     Past Surgical History  Procedure Laterality Date  . Abdominal hysterectomy    . Appendectomy       Current Outpatient Prescriptions  Medication Sig Dispense Refill  . amitriptyline (ELAVIL) 25 MG tablet Take 25 mg by mouth at bedtime.    Marland Kitchen aspirin 81 MG tablet Take 81 mg by mouth daily.    Marland Kitchen HUMALOG KWIKPEN 100 UNIT/ML KiwkPen Inject 8-10 Units into the skin daily after supper.     . hydrochlorothiazide (HYDRODIURIL) 25 MG tablet Take 25 mg by mouth daily.    . insulin glargine (LANTUS) 100 UNIT/ML injection Inject 12 Units into the skin every morning.     Marland Kitchen levothyroxine (SYNTHROID, LEVOTHROID) 88 MCG tablet Take 88 mcg by mouth daily before breakfast.    . losartan (COZAAR) 100 MG tablet Take 1 tablet by mouth  daily.    Earney Navy Bicarbonate (ZEGERID OTC) 20-1100 MG CAPS capsule Take 1 capsule by mouth daily before breakfast.    . oxyCODONE-acetaminophen (PERCOCET) 7.5-325 MG tablet Take 1 tablet by mouth 4 (four) times daily as needed.    . Probiotic Product (ALIGN PO) Take 1 tablet by mouth every morning.    . simvastatin (ZOCOR) 10 MG tablet Take 10 mg by mouth every evening.     . sitaGLIPtin (JANUVIA) 50 MG tablet Take 50 mg by mouth daily.    Marland Kitchen tiZANidine (ZANAFLEX) 2 MG tablet Take 1 tablet by mouth 2 (two) times daily.    . nitroGLYCERIN (NITROSTAT) 0.4 MG SL tablet Place 1 tablet (0.4 mg total) under the tongue every 5 (five) minutes as needed for chest pain. (Patient not taking: Reported on 11/15/2014) 25 tablet 3   No current facility-administered medications for this visit.    Allergies:   Byetta 10 mcg pen; Codeine; Erythromycin; Metformin and related; and Penicillins    Social History:  The patient  reports that she has been smoking Cigarettes.  She has been smoking about 0.25 packs per day. She does not have any smokeless tobacco history on  file. She reports that she does not drink alcohol or use illicit drugs.   Family History:  The patient's family history includes CAD in her brother; Emphysema in her father; Heart disease in her brother. There is no history of Heart attack or Stroke.    ROS:  Please see the history of present illness.   Otherwise, review of systems are positive for none.   All other systems are reviewed and negative.    PHYSICAL EXAM: VS:  BP 132/58 mmHg  Pulse 73  Ht 5\' 4"  (1.626 m)  Wt 167 lb (75.751 kg)  BMI 28.65 kg/m2  SpO2 98% , BMI Body mass index is 28.65 kg/(m^2). GEN: Well nourished, well developed, in no acute distress HEENT: normal Neck: no JVD, carotid bruits, or masses Cardiac: RRR; no murmurs, rubs, or gallops,no edema  Respiratory:  clear to auscultation bilaterally, normal work of breathing GI: soft, nontender, nondistended, +  BS MS: no deformity or atrophy Skin: warm and dry, no rash Neuro:  Strength and sensation are intact Psych: euthymic mood, full affect   EKG:  EKG is not ordered today.    Recent Labs: 07/27/2014: ALT 26; BUN 43*; Creatinine, Ser 1.33*; Hemoglobin 13.0; Platelets 332; Potassium 4.4; Sodium 136    Lipid Panel No results found for: CHOL, TRIG, HDL, CHOLHDL, VLDL, LDLCALC, LDLDIRECT    Wt Readings from Last 3 Encounters:  11/15/14 167 lb (75.751 kg)  09/15/14 162 lb (73.483 kg)  09/11/14 162 lb (73.483 kg)       ASSESSMENT AND PLAN: Chest pain Chest pain is worrisome for ischemia but she also has GERD so this could be esophageal spasm.  . Recommend coronary CTA with morph and calcium score to evaluate further.   Patient has multiple risk factors for CAD including diabetes, hypertension, hyperlipidemia and ongoing tobacco abuse.   Hyperlipidemia Patient is on Zocor.  Hypertension Well controlled on HCTZ and Losartan  Tobacco abuse Patient continues to smoke cigarettes. Discussed the importance of smoking cessation.   Current medicines are reviewed at length with the patient today.  The patient does not have concerns regarding medicines.  The following changes have been made:  no change  Labs/ tests ordered today: See above Assessment and Plan  Orders Placed This Encounter  Procedures  . CT Coronary Morp W/Cta Cor W/Score W/Ca W/Cm &/Or Wo/Cm  . Basic metabolic panel     Disposition:   FU with me in PRN pending results of coronary CTA    Signed, Sueanne Margarita, MD  11/15/2014 2:52 PM    [facility] [place], [place], [place]  [address] Phone: [phone]; Fax: [phone]

## 2014-12-13 NOTE — Research (Signed)
CADLAD Informed Consent   Subject Name: Tammie West  Subject met inclusion and exclusion criteria.  The informed consent form, study requirements and expectations were reviewed with the subject and questions and concerns were addressed prior to the signing of the consent form.  The subject verbalized understanding of the trail requirements.  The subject agreed to participate in the CADLAD trial and signed the informed consent.  The informed consent was obtained prior to performance of any protocol-specific procedures for the subject.  A copy of the signed informed consent was given to the subject and a copy was placed in the subject's medical record.  Sandie Ano 12/13/2014, 11:25

## 2014-12-13 NOTE — Discharge Instructions (Signed)
Radial Site Care °Refer to this sheet in the next few weeks. These instructions provide you with information about caring for yourself after your procedure. Your health care provider may also give you more specific instructions. Your treatment has been planned according to current medical practices, but problems sometimes occur. Call your health care provider if you have any problems or questions after your procedure. °WHAT TO EXPECT AFTER THE PROCEDURE °After your procedure, it is typical to have the following: °· Bruising at the radial site that usually fades within 1-2 weeks. °· Blood collecting in the tissue (hematoma) that may be painful to the touch. It should usually decrease in size and tenderness within 1-2 weeks. °HOME CARE INSTRUCTIONS °· Take medicines only as directed by your health care provider. °· You may shower 24-48 hours after the procedure or as directed by your health care provider. Remove the bandage (dressing) and gently wash the site with plain soap and water. Pat the area dry with a clean towel. Do not rub the site, because this may cause bleeding. °· Do not take baths, swim, or use a hot tub until your health care provider approves. °· Check your insertion site every day for redness, swelling, or drainage. °· Do not apply powder or lotion to the site. °· Do not flex or bend the affected arm for 24 hours or as directed by your health care provider. °· Do not push or pull heavy objects with the affected arm for 24 hours or as directed by your health care provider. °· Do not lift over 10 lb (4.5 kg) for 5 days after your procedure or as directed by your health care provider. °· Ask your health care provider when it is okay to: °¨ Return to work or school. °¨ Resume usual physical activities or sports. °¨ Resume sexual activity. °· Do not drive home if you are discharged the same day as the procedure. Have someone else drive you. °· You may drive 24 hours after the procedure unless otherwise  instructed by your health care provider. °· Do not operate machinery or power tools for 24 hours after the procedure. °· If your procedure was done as an outpatient procedure, which means that you went home the same day as your procedure, a responsible adult should be with you for the first 24 hours after you arrive home. °· Keep all follow-up visits as directed by your health care provider. This is important. °SEEK MEDICAL CARE IF: °· You have a fever. °· You have chills. °· You have increased bleeding from the radial site. Hold pressure on the site. CALL 911 °SEEK IMMEDIATE MEDICAL CARE IF: °· You have unusual pain at the radial site. °· You have redness, warmth, or swelling at the radial site. °· You have drainage (other than a small amount of blood on the dressing) from the radial site. °· The radial site is bleeding, and the bleeding does not stop after 30 minutes of holding steady pressure on the site. °· Your arm or hand becomes pale, cool, tingly, or numb. °  °This information is not intended to replace advice given to you by your health care provider. Make sure you discuss any questions you have with your health care provider. °  °Document Released: 02/15/2010 Document Revised: 02/03/2014 Document Reviewed: 08/01/2013 °Elsevier Interactive Patient Education ©2016 [employer] ° °

## 2014-12-13 NOTE — Progress Notes (Signed)
Ambulated to BR to void. Tolerated well. Back to recliner.

## 2014-12-13 NOTE — Interval H&P Note (Signed)
Cath Lab Visit (complete for each Cath Lab visit)  Clinical Evaluation Leading to the Procedure:   ACS: No.  Non-ACS:    Anginal Classification: CCS II  Anti-ischemic medical therapy: Minimal Therapy (1 class of medications)  Non-Invasive Test Results: Low-risk stress test findings: cardiac mortality <1%/year  Prior CABG: No previous CABG      History and Physical Interval Note:  12/13/2014 2:08 PM  Tammie West  has presented today for surgery, with the diagnosis of chest pain  The various methods of treatment have been discussed with the patient and family. After consideration of risks, benefits and other options for treatment, the patient has consented to  Procedure(s): Left Heart Cath and Coronary Angiography (N/A) as a surgical intervention .  The patient's history has been reviewed, patient examined, no change in status, stable for surgery.  I have reviewed the patient's chart and labs.  Questions were answered to the patient's satisfaction.     Sinclair Grooms

## 2014-12-14 ENCOUNTER — Encounter (HOSPITAL_COMMUNITY): Payer: Self-pay | Admitting: Interventional Cardiology

## 2015-01-03 ENCOUNTER — Ambulatory Visit: Payer: Medicare Other | Admitting: Nurse Practitioner

## 2015-01-10 ENCOUNTER — Encounter: Payer: Self-pay | Admitting: Cardiology

## 2015-01-10 ENCOUNTER — Ambulatory Visit: Payer: Medicare Other | Admitting: Nurse Practitioner

## 2015-01-10 ENCOUNTER — Ambulatory Visit (INDEPENDENT_AMBULATORY_CARE_PROVIDER_SITE_OTHER): Payer: Medicare Other | Admitting: Cardiology

## 2015-01-10 VITALS — BP 146/68 | HR 68 | Ht 64.0 in | Wt 172.0 lb

## 2015-01-10 DIAGNOSIS — I208 Other forms of angina pectoris: Secondary | ICD-10-CM | POA: Diagnosis not present

## 2015-01-10 DIAGNOSIS — E785 Hyperlipidemia, unspecified: Secondary | ICD-10-CM | POA: Diagnosis not present

## 2015-01-10 DIAGNOSIS — I10 Essential (primary) hypertension: Secondary | ICD-10-CM

## 2015-01-10 MED ORDER — ASPIRIN EC 81 MG PO TBEC: 81.0000 mg | DELAYED_RELEASE_TABLET | Freq: Every day | ORAL | Status: DC

## 2015-01-10 NOTE — Patient Instructions (Signed)
Medication Instructions:  Your physician has recommended you make the following change in your medication: 1) DECREASE ASPIRIN to 81 mg daily  Labwork: Your physician recommends that you return for FASTING lab work.  Testing/Procedures: None  Follow-Up: Your physician wants you to follow-up in: 1 year with Dr. Radford Pax. You will receive a reminder letter in the mail two months in advance. If you don't receive a letter, please call our office to schedule the follow-up appointment.   Any Other Special Instructions Will Be Listed Below (If Applicable).     If you need a refill on your cardiac medications before your next appointment, please call your pharmacy.

## 2015-01-10 NOTE — Progress Notes (Signed)
Cardiology Office Note   Date:  01/10/2015   ID:  JOCEE CARREIRA, DOB 14-Aug-1932, MRN [id number]  PCP:  Lottie Dawson, MD    Chief Complaint  Patient presents with  . Coronary Artery Disease  . Hypertension      History of Present Illness:   Tammie West is a 81 y.o. female with a history of HTN, nonobstructive ASCAD with Prox RCA to Mid RCA lesion, 45% stenosed and Ost LAD to Prox LAD lesion, 30% stenosed by recent cath and high calcium score by coronary CTA. She also has dyslipidemia and continues with tobacco use.but is slowly cutting back and is determined to quit.  She presents today for followup. She denies any chest pain or pressure, SOB, DOE, dizziness, palpitations or syncope.  She occasionally has some mild LE edema.      Past Medical History  Diagnosis Date  . Diabetes mellitus without complication ([place])   . Hypertension   . Crohn's disease ([place])   . Insomnia   . Hypothyroidism   . Goiter   . Osteoporosis   . Hyperlipidemia   . Depression   . Carpal tunnel syndrome   . Renal failure   . GERD (gastroesophageal reflux disease)   . Osteoarthritis   . Trochanteric bursitis   . Migraine   . Coronary artery disease 11/2014     cath with Prox RCA to Mid RCA lesion, 45% stenosed and Ost LAD to Prox LAD lesion, 30% stenosed byt recent cath and high calcium score by coronary CTA.    Past Surgical History  Procedure Laterality Date  . Abdominal hysterectomy    . Appendectomy    . Cardiac catheterization N/A 12/13/2014    Procedure: Left Heart Cath and Coronary Angiography;  Surgeon: Belva Crome, MD;  Location: [facility];  Service: Cardiovascular;  Laterality: N/A;     Current Outpatient Prescriptions  Medication Sig Dispense Refill  . acetaminophen (TYLENOL) 500 MG tablet Take 1,000 mg by mouth daily as needed for headache.    Marland Kitchen amitriptyline (ELAVIL) 25 MG tablet Take 25 mg by mouth at bedtime.    Marland Kitchen  aspirin EC 325 MG tablet Take 325 mg by mouth daily.    . clidinium-chlordiazePOXIDE (LIBRAX) 5-2.5 MG capsule Take 1 capsule by mouth every 4 (four) hours as needed (stomach pain).    . DULoxetine (CYMBALTA) 30 MG capsule Take 30 mg by mouth at bedtime.    . hydrochlorothiazide (HYDRODIURIL) 25 MG tablet Take 25 mg by mouth daily after breakfast.     . insulin glargine (LANTUS) 100 unit/mL SOPN Inject 16 Units into the skin daily after breakfast.     . insulin lispro (HUMALOG KWIKPEN) 100 UNIT/ML KiwkPen Inject 12 Units into the skin daily before supper.    . levothyroxine (SYNTHROID, LEVOTHROID) 112 MCG tablet Take 1 tablet by mouth daily. Take 1 tab daily    . losartan (COZAAR) 100 MG tablet Take 100 mg by mouth daily after breakfast.     . mesalamine (LIALDA) 1.2 G EC tablet Take 2.4 g by mouth daily after breakfast.    . nitroGLYCERIN (NITROSTAT) 0.4 MG SL tablet Place 1 tablet (0.4 mg total) under the tongue every 5 (five) minutes as needed for chest pain. 25 tablet 3  . Omeprazole-Sodium Bicarbonate (ZEGERID OTC) 20-1100 MG CAPS capsule Take 1 capsule by mouth daily after breakfast.     .  oxyCODONE-acetaminophen (PERCOCET) 10-325 MG tablet Take 1 tablet by mouth 4 (four) times daily as needed. Take 1 tab 4 times a day as needed for pain    . Probiotic Product (ALIGN PO) Take 1 capsule by mouth daily after breakfast.    . simvastatin (ZOCOR) 10 MG tablet Take 10 mg by mouth at bedtime.     . sitaGLIPtin (JANUVIA) 50 MG tablet Take 50 mg by mouth daily after breakfast.     . terconazole (TERAZOL 7) 0.4 % vaginal cream Place 1 applicator vaginally 2 (two) times daily as needed (infection/ irritation).     Marland Kitchen tiZANidine (ZANAFLEX) 2 MG tablet Take 2 mg by mouth 2 (two) times daily.      No current facility-administered medications for this visit.    Allergies:   Byetta 10 mcg pen; Cefaclor; Codeine; Dexilant; Erythromycin; Fosamax; Macrobid; Metformin and related; Penicillins; and Shellfish  allergy    Social History:  The patient  reports that she has been smoking Cigarettes.  She has been smoking about 0.25 packs per day. She does not have any smokeless tobacco history on file. She reports that she does not drink alcohol or use illicit drugs.   Family History:  The patient's family history includes CAD in her brother; Emphysema in her father; Heart disease in her brother. There is no history of Heart attack or Stroke.    ROS:  Please see the history of present illness.   Otherwise, review of systems are positive for none.   All other systems are reviewed and negative.    PHYSICAL EXAM: VS:  BP 146/68 mmHg  Pulse 68  Ht 5\' 4"  (1.626 m)  Wt 172 lb (78.019 kg)  BMI 29.51 kg/m2 , BMI Body mass index is 29.51 kg/(m^2). GEN: Well nourished, well developed, in no acute distress HEENT: normal Neck: no JVD, carotid bruits, or masses Cardiac: RRR; no murmurs, rubs, or gallops,no edema  Respiratory:  clear to auscultation bilaterally, normal work of breathing GI: soft, nontender, nondistended, + BS MS: no deformity or atrophy Skin: warm and dry, no rash Neuro:  Strength and sensation are intact Psych: euthymic mood, full affect   EKG:  EKG is not ordered today.    Recent Labs: 07/27/2014: ALT 26 12/13/2014: BUN 32*; Creatinine, Ser 1.36*; Hemoglobin 12.0; Platelets 277; Potassium 4.4; Sodium 140    Lipid Panel No results found for: CHOL, TRIG, HDL, CHOLHDL, VLDL, LDLCALC, LDLDIRECT    Wt Readings from Last 3 Encounters:  01/10/15 172 lb (78.019 kg)  12/13/14 160 lb (72.576 kg)  11/15/14 167 lb (75.751 kg)     ASSESSMENT AND PLAN: Nonobstructive ASCAD by recent cath. Continue on statin/ASA and decrease ASA to 81mg  daily.    Hypertension Borderline controlled on HCTZ and Losartan  Tobacco abuse Patient continues to smoke cigarettes. Discussed the importance of smoking cessation.  Dyslipidemia with LDL goal < 70 - check FLp and ALT     Current medicines  are reviewed at length with the patient today.  The patient does not have concerns regarding medicines.  The following changes have been made:  no change  Labs/ tests ordered today: See above Assessment and Plan No orders of the defined types were placed in this encounter.     Disposition:   FU with me in 1 year  Signed, Sueanne Margarita, MD  01/10/2015 11:04 AM    [facility] [place], [place], [place]  [address] Phone: [phone]; Fax: [phone]

## 2015-01-15 ENCOUNTER — Other Ambulatory Visit (INDEPENDENT_AMBULATORY_CARE_PROVIDER_SITE_OTHER): Payer: Medicare Other | Admitting: *Deleted

## 2015-01-15 DIAGNOSIS — I10 Essential (primary) hypertension: Secondary | ICD-10-CM | POA: Diagnosis not present

## 2015-01-15 DIAGNOSIS — E785 Hyperlipidemia, unspecified: Secondary | ICD-10-CM

## 2015-01-15 LAB — LIPID PANEL
Cholesterol: 114 mg/dL — ABNORMAL LOW (ref 125–200)
HDL: 49 mg/dL (ref >=46)
LDL CALC: 44 mg/dL (ref <130)
TRIGLYCERIDES: 104 mg/dL (ref <150)
Total CHOL/HDL Ratio: 2.3 Ratio (ref <=5.0)
VLDL: 21 mg/dL (ref <30)

## 2015-01-15 LAB — HEPATIC FUNCTION PANEL
ALT: 27 U/L (ref 6–29)
AST: 23 U/L (ref 10–35)
Albumin: 3.7 g/dL (ref 3.6–5.1)
Alkaline Phosphatase: 98 U/L (ref 33–130)
Bilirubin, Direct: 0.1 mg/dL (ref <=0.2)
Indirect Bilirubin: 0.2 mg/dL (ref 0.2–1.2)
Total Bilirubin: 0.3 mg/dL (ref 0.2–1.2)
Total Protein: 7.1 g/dL (ref 6.1–8.1)

## 2015-01-15 LAB — BASIC METABOLIC PANEL
BUN: 32 mg/dL — ABNORMAL HIGH (ref 7–25)
CO2: 29 mmol/L (ref 20–31)
Calcium: 9.3 mg/dL (ref 8.6–10.4)
Chloride: 103 mmol/L (ref 98–110)
Creat: 1.21 mg/dL — ABNORMAL HIGH (ref 0.60–0.88)
Glucose, Bld: 92 mg/dL (ref 65–99)
Potassium: 4.5 mmol/L (ref 3.5–5.3)
Sodium: 138 mmol/L (ref 135–146)

## 2015-02-20 DIAGNOSIS — M4697 Unspecified inflammatory spondylopathy, lumbosacral region: Secondary | ICD-10-CM | POA: Diagnosis not present

## 2015-02-20 DIAGNOSIS — Z79899 Other long term (current) drug therapy: Secondary | ICD-10-CM | POA: Diagnosis not present

## 2015-02-20 DIAGNOSIS — M549 Dorsalgia, unspecified: Secondary | ICD-10-CM | POA: Diagnosis not present

## 2015-02-20 DIAGNOSIS — E039 Hypothyroidism, unspecified: Secondary | ICD-10-CM | POA: Diagnosis not present

## 2015-02-20 DIAGNOSIS — G894 Chronic pain syndrome: Secondary | ICD-10-CM | POA: Diagnosis not present

## 2015-02-22 DIAGNOSIS — E1165 Type 2 diabetes mellitus with hyperglycemia: Secondary | ICD-10-CM | POA: Diagnosis not present

## 2015-02-22 DIAGNOSIS — M810 Age-related osteoporosis without current pathological fracture: Secondary | ICD-10-CM | POA: Diagnosis not present

## 2015-02-22 DIAGNOSIS — E039 Hypothyroidism, unspecified: Secondary | ICD-10-CM | POA: Diagnosis not present

## 2015-02-22 DIAGNOSIS — E049 Nontoxic goiter, unspecified: Secondary | ICD-10-CM | POA: Diagnosis not present

## 2015-02-22 DIAGNOSIS — R202 Paresthesia of skin: Secondary | ICD-10-CM | POA: Diagnosis not present

## 2015-02-22 DIAGNOSIS — Z794 Long term (current) use of insulin: Secondary | ICD-10-CM | POA: Diagnosis not present

## 2015-03-06 ENCOUNTER — Ambulatory Visit: Payer: Medicare Other | Admitting: Cardiology

## 2015-03-20 DIAGNOSIS — Z79899 Other long term (current) drug therapy: Secondary | ICD-10-CM | POA: Diagnosis not present

## 2015-03-20 DIAGNOSIS — M4697 Unspecified inflammatory spondylopathy, lumbosacral region: Secondary | ICD-10-CM | POA: Diagnosis not present

## 2015-03-20 DIAGNOSIS — M1990 Unspecified osteoarthritis, unspecified site: Secondary | ICD-10-CM | POA: Diagnosis not present

## 2015-03-20 DIAGNOSIS — G894 Chronic pain syndrome: Secondary | ICD-10-CM | POA: Diagnosis not present

## 2015-03-22 DIAGNOSIS — J209 Acute bronchitis, unspecified: Secondary | ICD-10-CM | POA: Diagnosis not present

## 2015-03-22 DIAGNOSIS — R69 Illness, unspecified: Secondary | ICD-10-CM | POA: Diagnosis not present

## 2015-03-27 ENCOUNTER — Encounter (HOSPITAL_COMMUNITY): Payer: Self-pay | Admitting: Cardiology

## 2015-03-27 ENCOUNTER — Emergency Department (HOSPITAL_COMMUNITY)
Admission: EM | Admit: 2015-03-27 | Discharge: 2015-03-27 | Disposition: A | Discharge status: Home / Self Care | Payer: Medicare HMO | Attending: Emergency Medicine | Admitting: Emergency Medicine

## 2015-03-27 ENCOUNTER — Emergency Department (HOSPITAL_COMMUNITY): Payer: Medicare HMO

## 2015-03-27 DIAGNOSIS — I10 Essential (primary) hypertension: Secondary | ICD-10-CM | POA: Insufficient documentation

## 2015-03-27 DIAGNOSIS — K219 Gastro-esophageal reflux disease without esophagitis: Secondary | ICD-10-CM | POA: Diagnosis not present

## 2015-03-27 DIAGNOSIS — Z794 Long term (current) use of insulin: Secondary | ICD-10-CM | POA: Insufficient documentation

## 2015-03-27 DIAGNOSIS — J209 Acute bronchitis, unspecified: Secondary | ICD-10-CM | POA: Diagnosis not present

## 2015-03-27 DIAGNOSIS — Z79899 Other long term (current) drug therapy: Secondary | ICD-10-CM | POA: Diagnosis not present

## 2015-03-27 DIAGNOSIS — M1990 Unspecified osteoarthritis, unspecified site: Secondary | ICD-10-CM | POA: Insufficient documentation

## 2015-03-27 DIAGNOSIS — E039 Hypothyroidism, unspecified: Secondary | ICD-10-CM | POA: Insufficient documentation

## 2015-03-27 DIAGNOSIS — Z7984 Long term (current) use of oral hypoglycemic drugs: Secondary | ICD-10-CM | POA: Diagnosis not present

## 2015-03-27 DIAGNOSIS — R69 Illness, unspecified: Secondary | ICD-10-CM | POA: Diagnosis not present

## 2015-03-27 DIAGNOSIS — Z7982 Long term (current) use of aspirin: Secondary | ICD-10-CM | POA: Diagnosis not present

## 2015-03-27 DIAGNOSIS — E119 Type 2 diabetes mellitus without complications: Secondary | ICD-10-CM | POA: Insufficient documentation

## 2015-03-27 DIAGNOSIS — I2510 Atherosclerotic heart disease of native coronary artery without angina pectoris: Secondary | ICD-10-CM | POA: Insufficient documentation

## 2015-03-27 DIAGNOSIS — R079 Chest pain, unspecified: Secondary | ICD-10-CM | POA: Diagnosis present

## 2015-03-27 DIAGNOSIS — Z880 Allergy status to penicillin: Secondary | ICD-10-CM | POA: Diagnosis not present

## 2015-03-27 DIAGNOSIS — F17210 Nicotine dependence, cigarettes, uncomplicated: Secondary | ICD-10-CM | POA: Insufficient documentation

## 2015-03-27 DIAGNOSIS — G4700 Insomnia, unspecified: Secondary | ICD-10-CM | POA: Insufficient documentation

## 2015-03-27 DIAGNOSIS — R509 Fever, unspecified: Secondary | ICD-10-CM | POA: Diagnosis not present

## 2015-03-27 DIAGNOSIS — E785 Hyperlipidemia, unspecified: Secondary | ICD-10-CM | POA: Insufficient documentation

## 2015-03-27 DIAGNOSIS — F329 Major depressive disorder, single episode, unspecified: Secondary | ICD-10-CM | POA: Diagnosis not present

## 2015-03-27 DIAGNOSIS — R05 Cough: Secondary | ICD-10-CM | POA: Diagnosis not present

## 2015-03-27 DIAGNOSIS — Z9889 Other specified postprocedural states: Secondary | ICD-10-CM | POA: Insufficient documentation

## 2015-03-27 DIAGNOSIS — G43909 Migraine, unspecified, not intractable, without status migrainosus: Secondary | ICD-10-CM | POA: Diagnosis not present

## 2015-03-27 DIAGNOSIS — J9801 Acute bronchospasm: Secondary | ICD-10-CM

## 2015-03-27 LAB — I-STAT TROPONIN, ED
TROPONIN I, POC: 0.01 ng/mL (ref 0.00–0.08)
Troponin i, poc: 0 ng/mL (ref 0.00–0.08)

## 2015-03-27 LAB — CBC
HCT: 38.8 % (ref 36.0–46.0)
HEMOGLOBIN: 13.1 g/dL (ref 12.0–15.0)
MCH: 30.8 pg (ref 26.0–34.0)
MCHC: 33.8 g/dL (ref 30.0–36.0)
MCV: 91.1 fL (ref 78.0–100.0)
Platelets: 280 10*3/uL (ref 150–400)
RBC: 4.26 MIL/uL (ref 3.87–5.11)
RDW: 13.4 % (ref 11.5–15.5)
WBC: 11.4 10*3/uL — ABNORMAL HIGH (ref 4.0–10.5)

## 2015-03-27 LAB — BASIC METABOLIC PANEL
ANION GAP: 11 (ref 5–15)
BUN: 37 mg/dL — ABNORMAL HIGH (ref 6–20)
CALCIUM: 9.2 mg/dL (ref 8.9–10.3)
CO2: 24 mmol/L (ref 22–32)
Chloride: 106 mmol/L (ref 101–111)
Creatinine, Ser: 1.56 mg/dL — ABNORMAL HIGH (ref 0.44–1.00)
GFR, EST AFRICAN AMERICAN: 35 mL/min — AB (ref >60)
GFR, EST NON AFRICAN AMERICAN: 30 mL/min — AB (ref >60)
Glucose, Bld: 230 mg/dL — ABNORMAL HIGH (ref 65–99)
Potassium: 3.7 mmol/L (ref 3.5–5.1)
Sodium: 141 mmol/L (ref 135–145)

## 2015-03-27 MED ORDER — ALBUTEROL SULFATE HFA 108 (90 BASE) MCG/ACT IN AERS: 1.0000 | INHALATION_SPRAY | Freq: Four times a day (QID) | RESPIRATORY_TRACT | Status: DC | PRN

## 2015-03-27 MED ORDER — PREDNISONE 20 MG PO TABS: 20.0000 mg | ORAL_TABLET | Freq: Two times a day (BID) | ORAL | Status: DC

## 2015-03-27 MED ORDER — GUAIFENESIN ER 600 MG PO TB12: 600.0000 mg | ORAL_TABLET | Freq: Two times a day (BID) | ORAL | Status: DC

## 2015-03-27 MED ORDER — GUAIFENESIN-CODEINE 100-10 MG/5ML PO SYRP: 5.0000 mL | ORAL_SOLUTION | Freq: Three times a day (TID) | ORAL | Status: DC | PRN

## 2015-03-27 MED ORDER — ALBUTEROL SULFATE (2.5 MG/3ML) 0.083% IN NEBU
2.5000 mg | INHALATION_SOLUTION | Freq: Once | RESPIRATORY_TRACT | Status: AC
  Administered 2015-03-27: 2.5 mg via RESPIRATORY_TRACT
  Filled 2015-03-27: qty 3

## 2015-03-27 MED ORDER — ALBUTEROL (5 MG/ML) CONTINUOUS INHALATION SOLN
10.0000 mg/h | INHALATION_SOLUTION | Freq: Once | RESPIRATORY_TRACT | Status: AC
  Administered 2015-03-27: 10 mg/h via RESPIRATORY_TRACT
  Filled 2015-03-27: qty 20

## 2015-03-27 MED ORDER — PREDNISONE 20 MG PO TABS
20.0000 mg | ORAL_TABLET | Freq: Once | ORAL | Status: AC
  Administered 2015-03-27: 20 mg via ORAL
  Filled 2015-03-27: qty 1

## 2015-03-27 MED ORDER — BENZONATATE 100 MG PO CAPS: 100.0000 mg | ORAL_CAPSULE | Freq: Three times a day (TID) | ORAL | Status: DC

## 2015-03-27 MED ORDER — HYDROCODONE-ACETAMINOPHEN 7.5-325 MG/15ML PO SOLN
10.0000 mL | Freq: Once | ORAL | Status: AC
  Administered 2015-03-27: 10 mL via ORAL
  Filled 2015-03-27: qty 15

## 2015-03-27 NOTE — ED Notes (Signed)
Reports yellow sputum and husbAND IS SICK chest hurts when she coughs

## 2015-03-27 NOTE — ED Notes (Signed)
NAD at this time.  

## 2015-03-27 NOTE — ED Notes (Signed)
Called daughter and told her all d/c instructions , pt still coughing up thick yellow sputum out to lobby where husband was waitiong

## 2015-03-27 NOTE — ED Provider Notes (Signed)
CSN: [id number]     Arrival date & time 03/27/15  [id number] History   First MD Initiated Contact with Patient 03/27/15 [phone]     Chief Complaint  Patient presents with  . Chest Pain      HPI  Impression presents for evaluation with a cough and chest pain. See states she has had a cough for about 3 or 4 weeks. Was seen by her primary care physician about 10 days ago. Given Zithromax. Finished yesterday. Did not have an x-ray. Does not feel so she is improved. Frequent cough. His sore under her ribs and in the center chest with her cough. Was a little more short of breath and coughing more this morning so she presents here. Husband has similar symptoms at home.  She states that several months ago she had an episode of chest pressure and underwent a heart cath that was negative.  She is a lifetime nonsmoker. No history of asthma or COPD. No history of congestive heart failure heart problems.  She is immunized for influenza, and Pneumovax  Past Medical History  Diagnosis Date  . Diabetes mellitus without complication ([place])   . Hypertension   . Crohn's disease ([place])   . Insomnia   . Hypothyroidism   . Goiter   . Osteoporosis   . Hyperlipidemia   . Depression   . Carpal tunnel syndrome   . Renal failure   . GERD (gastroesophageal reflux disease)   . Osteoarthritis   . Trochanteric bursitis   . Migraine   . Coronary artery disease 11/2014     cath with Prox RCA to Mid RCA lesion, 45% stenosed and Ost LAD to Prox LAD lesion, 30% stenosed byt recent cath and high calcium score by coronary CTA.   Past Surgical History  Procedure Laterality Date  . Abdominal hysterectomy    . Appendectomy    . Cardiac catheterization N/A 12/13/2014    Procedure: Left Heart Cath and Coronary Angiography;  Surgeon: Belva Crome, MD;  Location: [facility];  Service: Cardiovascular;  Laterality: N/A;   Family History  Problem Relation Age of Onset  . Emphysema Father   . CAD Brother   . Heart  disease Brother   . Heart attack Neg Hx   . Stroke Neg Hx    Social History  Substance Use Topics  . Smoking status: Current Some Day Smoker -- 0.25 packs/day    Types: Cigarettes  . Smokeless tobacco: None  . Alcohol Use: No   OB History    No data available     Review of Systems  Constitutional: Negative for fever, chills, diaphoresis, appetite change and fatigue.  HENT: Negative for mouth sores, sore throat and trouble swallowing.   Eyes: Negative for visual disturbance.  Respiratory: Positive for cough, chest tightness and shortness of breath. Negative for wheezing.   Cardiovascular: Negative for chest pain.  Gastrointestinal: Negative for nausea, vomiting, abdominal pain, diarrhea and abdominal distention.  Endocrine: Negative for polydipsia, polyphagia and polyuria.  Genitourinary: Negative for dysuria, frequency and hematuria.  Musculoskeletal: Negative for gait problem.  Skin: Negative for color change, pallor and rash.  Neurological: Negative for dizziness, syncope, light-headedness and headaches.  Hematological: Does not bruise/bleed easily.  Psychiatric/Behavioral: Negative for behavioral problems and confusion.      Allergies  Byetta 10 mcg pen; Cefaclor; Codeine; Dexilant; Erythromycin; Fosamax; Macrobid; Metformin and related; Penicillins; and Shellfish allergy  Home Medications   Prior to Admission medications   Medication Sig Start  Date End Date Taking? Authorizing Provider  acetaminophen (TYLENOL) 500 MG tablet Take 1,000 mg by mouth daily as needed for headache.    Historical Provider, MD  albuterol (PROVENTIL HFA;VENTOLIN HFA) 108 (90 Base) MCG/ACT inhaler Inhale 1-2 puffs into the lungs every 6 (six) hours as needed for wheezing. 03/27/15   Tanna Furry, MD  amitriptyline (ELAVIL) 25 MG tablet Take 25 mg by mouth at bedtime.    Historical Provider, MD  aspirin EC 81 MG tablet Take 1 tablet (81 mg total) by mouth daily. 01/10/15   Sueanne Margarita, MD    benzonatate (TESSALON) 100 MG capsule Take 1 capsule (100 mg total) by mouth every 8 (eight) hours. 03/27/15   Tanna Furry, MD  clidinium-chlordiazePOXIDE (LIBRAX) 5-2.5 MG capsule Take 1 capsule by mouth every 4 (four) hours as needed (stomach pain).    Historical Provider, MD  DULoxetine (CYMBALTA) 30 MG capsule Take 30 mg by mouth at bedtime. 11/15/14   Historical Provider, MD  guaiFENesin (MUCINEX) 600 MG 12 hr tablet Take 1 tablet (600 mg total) by mouth 2 (two) times daily. 03/27/15   Tanna Furry, MD  guaiFENesin-codeine (CHERATUSSIN AC) 100-10 MG/5ML syrup Take 5 mLs by mouth 3 (three) times daily as needed for cough. 03/27/15   Tanna Furry, MD  hydrochlorothiazide (HYDRODIURIL) 25 MG tablet Take 25 mg by mouth daily after breakfast.     Historical Provider, MD  insulin glargine (LANTUS) 100 unit/mL SOPN Inject 16 Units into the skin daily after breakfast.     Historical Provider, MD  insulin lispro (HUMALOG KWIKPEN) 100 UNIT/ML KiwkPen Inject 12 Units into the skin daily before supper.    Historical Provider, MD  levothyroxine (SYNTHROID, LEVOTHROID) 112 MCG tablet Take 1 tablet by mouth daily. Take 1 tab daily 01/08/15   Historical Provider, MD  losartan (COZAAR) 100 MG tablet Take 100 mg by mouth daily after breakfast.  08/31/14   Historical Provider, MD  mesalamine (LIALDA) 1.2 G EC tablet Take 2.4 g by mouth daily after breakfast.    Historical Provider, MD  nitroGLYCERIN (NITROSTAT) 0.4 MG SL tablet Place 1 tablet (0.4 mg total) under the tongue every 5 (five) minutes as needed for chest pain. 09/11/14   Imogene Burn, PA-C  Omeprazole-Sodium Bicarbonate (ZEGERID OTC) 20-1100 MG CAPS capsule Take 1 capsule by mouth daily after breakfast.     Historical Provider, MD  oxyCODONE-acetaminophen (PERCOCET) 10-325 MG tablet Take 1 tablet by mouth 4 (four) times daily as needed. Take 1 tab 4 times a day as needed for pain 12/26/14   Historical Provider, MD  predniSONE (DELTASONE) 20 MG tablet Take 1  tablet (20 mg total) by mouth 2 (two) times daily with a meal. 03/27/15   Tanna Furry, MD  Probiotic Product (ALIGN PO) Take 1 capsule by mouth daily after breakfast.    Historical Provider, MD  simvastatin (ZOCOR) 10 MG tablet Take 10 mg by mouth at bedtime.  02/09/13   Historical Provider, MD  sitaGLIPtin (JANUVIA) 50 MG tablet Take 50 mg by mouth daily after breakfast.     Historical Provider, MD  terconazole (TERAZOL 7) 0.4 % vaginal cream Place 1 applicator vaginally 2 (two) times daily as needed (infection/ irritation).  12/05/14   Historical Provider, MD  tiZANidine (ZANAFLEX) 2 MG tablet Take 2 mg by mouth 2 (two) times daily.  09/26/14   Historical Provider, MD   BP 131/49 mmHg  Pulse 86  Temp(Src) 98 F (36.7 C) (Oral)  Resp 28  Wt  172 lb (78.019 kg)  SpO2 99% Physical Exam  Constitutional: She is oriented to person, place, and time. She appears well-developed and well-nourished. No distress.  HENT:  Head: Normocephalic.  Eyes: Conjunctivae are normal. Pupils are equal, round, and reactive to light. No scleral icterus.  Neck: Normal range of motion. Neck supple. No thyromegaly present.  Cardiovascular: Normal rate and regular rhythm.  Exam reveals no gallop and no friction rub.   No murmur heard. No dependent edema  Pulmonary/Chest: Effort normal. No respiratory distress. She has decreased breath sounds in the right upper field, the right middle field, the right lower field, the left upper field, the left middle field and the left lower field. She has no wheezes. She has no rales.    Abdominal: Soft. Bowel sounds are normal. She exhibits no distension. There is no tenderness. There is no rebound.  Musculoskeletal: Normal range of motion.  Neurological: She is alert and oriented to person, place, and time.  Skin: Skin is warm and dry. No rash noted.  Psychiatric: She has a normal mood and affect. Her behavior is normal.    ED Course  Procedures (including critical care  time) Labs Review Labs Reviewed  BASIC METABOLIC PANEL - Abnormal; Notable for the following:    Glucose, Bld 230 (*)    BUN 37 (*)    Creatinine, Ser 1.56 (*)    GFR calc non Af Amer 30 (*)    GFR calc Af Amer 35 (*)    All other components within normal limits  CBC - Abnormal; Notable for the following:    WBC 11.4 (*)    All other components within normal limits  I-STAT TROPOININ, ED  Randolm Idol, ED    Imaging Review Dg Chest 2 View  03/27/2015  CLINICAL DATA:  Cough and congestion for the past 5 days. History of bronchitis. History of fever. EXAM: CHEST  2 VIEW COMPARISON:  07/27/2014; 07/10/2010; coronary CTA -12/04/2014 FINDINGS: Grossly unchanged cardiac silhouette and mediastinal contours with atherosclerotic plaque within the thoracic aorta. There is persistent mild elevation / eventration involving the medial aspect of the right hemidiaphragm. Linear heterogeneous opacities within the lingula are grossly unchanged and favored to represent subsegmental atelectasis / scar as was demonstrated on prior coronary artery CTA. No new focal airspace opacities. No pleural effusion or pneumothorax. No evidence of edema. No acute osseus abnormalities. IMPRESSION: Lingular atelectasis/scar without acute cardiopulmonary disease. Electronically Signed   By: Sandi Mariscal M.D.   On: 03/27/2015 09:25   I have personally reviewed and evaluated these images and lab results as part of my medical decision-making.   EKG Interpretation   Date/Time:  Tuesday March 27 2015 08:56:02 EST Ventricular Rate:  80 PR Interval:  202 QRS Duration: 78 QT Interval:  392 QTC Calculation: 452 R Axis:   -39 Text Interpretation:  Sinus rhythm with Fusion complexes Left axis  deviation Low voltage QRS Cannot rule out Anterior infarct , age  undetermined Abnormal ECG Confirmed by Jeneen Rinks  MD, [place] ([id number]) on  03/27/2015 9:19:03 AM      MDM   Final diagnoses:  Bronchospasm  Acute bronchitis,  unspecified organism    Diminished breath sounds. We'll try albuterol C if she gets symptomatically for changes the primary exam. Chest x-ray pending. EKG shows no acute changes. Plan troponin, x-ray, albuterol, low-dose prednisone with consideration for diabetes, reexamination.  13:17:  Patient had significant bronchospasm. This has improved with each nebulized treatment. Now with a very faint end  expiratory wheeze. Is not hypoxemic or febrile. Plan is home, MDI, prednisone, tension of blood sugars and sliding scale, Tessalon, Cheratussin. ER or primary care with changes.    Tanna Furry, MD 03/27/15 1319

## 2015-03-27 NOTE — ED Notes (Signed)
Pt reports chest pain that started this morning,. States she has had bronchitis but this morning her chest began to hurt. Also reports some SOB>

## 2015-03-28 ENCOUNTER — Encounter: Payer: Medicare HMO | Admitting: Neurology

## 2015-04-05 DIAGNOSIS — J209 Acute bronchitis, unspecified: Secondary | ICD-10-CM | POA: Diagnosis not present

## 2015-04-12 DIAGNOSIS — E039 Hypothyroidism, unspecified: Secondary | ICD-10-CM | POA: Diagnosis not present

## 2015-04-12 DIAGNOSIS — Z794 Long term (current) use of insulin: Secondary | ICD-10-CM | POA: Diagnosis not present

## 2015-04-12 DIAGNOSIS — Z7984 Long term (current) use of oral hypoglycemic drugs: Secondary | ICD-10-CM | POA: Diagnosis not present

## 2015-04-12 DIAGNOSIS — E1165 Type 2 diabetes mellitus with hyperglycemia: Secondary | ICD-10-CM | POA: Diagnosis not present

## 2015-04-16 DIAGNOSIS — E1165 Type 2 diabetes mellitus with hyperglycemia: Secondary | ICD-10-CM | POA: Diagnosis not present

## 2015-04-16 DIAGNOSIS — E039 Hypothyroidism, unspecified: Secondary | ICD-10-CM | POA: Diagnosis not present

## 2015-04-16 DIAGNOSIS — E049 Nontoxic goiter, unspecified: Secondary | ICD-10-CM | POA: Diagnosis not present

## 2015-04-16 DIAGNOSIS — M810 Age-related osteoporosis without current pathological fracture: Secondary | ICD-10-CM | POA: Diagnosis not present

## 2015-04-16 DIAGNOSIS — Z794 Long term (current) use of insulin: Secondary | ICD-10-CM | POA: Diagnosis not present

## 2015-04-17 DIAGNOSIS — G894 Chronic pain syndrome: Secondary | ICD-10-CM | POA: Diagnosis not present

## 2015-04-17 DIAGNOSIS — M545 Low back pain: Secondary | ICD-10-CM | POA: Diagnosis not present

## 2015-04-17 DIAGNOSIS — M4697 Unspecified inflammatory spondylopathy, lumbosacral region: Secondary | ICD-10-CM | POA: Diagnosis not present

## 2015-04-17 DIAGNOSIS — M1990 Unspecified osteoarthritis, unspecified site: Secondary | ICD-10-CM | POA: Diagnosis not present

## 2015-05-15 DIAGNOSIS — M4697 Unspecified inflammatory spondylopathy, lumbosacral region: Secondary | ICD-10-CM | POA: Diagnosis not present

## 2015-05-15 DIAGNOSIS — G894 Chronic pain syndrome: Secondary | ICD-10-CM | POA: Diagnosis not present

## 2015-05-15 DIAGNOSIS — M1990 Unspecified osteoarthritis, unspecified site: Secondary | ICD-10-CM | POA: Diagnosis not present

## 2015-05-15 DIAGNOSIS — Z79899 Other long term (current) drug therapy: Secondary | ICD-10-CM | POA: Diagnosis not present

## 2015-05-17 DIAGNOSIS — J069 Acute upper respiratory infection, unspecified: Secondary | ICD-10-CM | POA: Diagnosis not present

## 2015-05-25 DIAGNOSIS — J209 Acute bronchitis, unspecified: Secondary | ICD-10-CM | POA: Diagnosis not present

## 2015-06-04 DIAGNOSIS — E1165 Type 2 diabetes mellitus with hyperglycemia: Secondary | ICD-10-CM | POA: Diagnosis not present

## 2015-06-04 DIAGNOSIS — Z794 Long term (current) use of insulin: Secondary | ICD-10-CM | POA: Diagnosis not present

## 2015-06-07 DIAGNOSIS — J0111 Acute recurrent frontal sinusitis: Secondary | ICD-10-CM | POA: Diagnosis not present

## 2015-06-10 ENCOUNTER — Emergency Department (HOSPITAL_COMMUNITY)
Admission: EM | Admit: 2015-06-10 | Discharge: 2015-06-10 | Disposition: A | Discharge status: Home / Self Care | Payer: Medicare HMO | Attending: Emergency Medicine | Admitting: Emergency Medicine

## 2015-06-10 ENCOUNTER — Emergency Department (HOSPITAL_COMMUNITY): Payer: Medicare HMO

## 2015-06-10 ENCOUNTER — Encounter (HOSPITAL_COMMUNITY): Payer: Self-pay

## 2015-06-10 DIAGNOSIS — E039 Hypothyroidism, unspecified: Secondary | ICD-10-CM | POA: Insufficient documentation

## 2015-06-10 DIAGNOSIS — I10 Essential (primary) hypertension: Secondary | ICD-10-CM | POA: Insufficient documentation

## 2015-06-10 DIAGNOSIS — Z7952 Long term (current) use of systemic steroids: Secondary | ICD-10-CM | POA: Diagnosis not present

## 2015-06-10 DIAGNOSIS — I2510 Atherosclerotic heart disease of native coronary artery without angina pectoris: Secondary | ICD-10-CM | POA: Insufficient documentation

## 2015-06-10 DIAGNOSIS — M810 Age-related osteoporosis without current pathological fracture: Secondary | ICD-10-CM | POA: Insufficient documentation

## 2015-06-10 DIAGNOSIS — K5090 Crohn's disease, unspecified, without complications: Secondary | ICD-10-CM | POA: Diagnosis not present

## 2015-06-10 DIAGNOSIS — Z79899 Other long term (current) drug therapy: Secondary | ICD-10-CM | POA: Diagnosis not present

## 2015-06-10 DIAGNOSIS — R079 Chest pain, unspecified: Secondary | ICD-10-CM | POA: Diagnosis not present

## 2015-06-10 DIAGNOSIS — Z7982 Long term (current) use of aspirin: Secondary | ICD-10-CM | POA: Diagnosis not present

## 2015-06-10 DIAGNOSIS — F17210 Nicotine dependence, cigarettes, uncomplicated: Secondary | ICD-10-CM | POA: Diagnosis not present

## 2015-06-10 DIAGNOSIS — R059 Cough, unspecified: Secondary | ICD-10-CM

## 2015-06-10 DIAGNOSIS — Z794 Long term (current) use of insulin: Secondary | ICD-10-CM | POA: Insufficient documentation

## 2015-06-10 DIAGNOSIS — M1990 Unspecified osteoarthritis, unspecified site: Secondary | ICD-10-CM | POA: Insufficient documentation

## 2015-06-10 DIAGNOSIS — E119 Type 2 diabetes mellitus without complications: Secondary | ICD-10-CM | POA: Insufficient documentation

## 2015-06-10 DIAGNOSIS — E785 Hyperlipidemia, unspecified: Secondary | ICD-10-CM | POA: Insufficient documentation

## 2015-06-10 DIAGNOSIS — H9202 Otalgia, left ear: Secondary | ICD-10-CM | POA: Diagnosis not present

## 2015-06-10 DIAGNOSIS — R062 Wheezing: Secondary | ICD-10-CM | POA: Diagnosis not present

## 2015-06-10 DIAGNOSIS — R69 Illness, unspecified: Secondary | ICD-10-CM | POA: Diagnosis not present

## 2015-06-10 DIAGNOSIS — R05 Cough: Secondary | ICD-10-CM | POA: Diagnosis not present

## 2015-06-10 DIAGNOSIS — Z7951 Long term (current) use of inhaled steroids: Secondary | ICD-10-CM | POA: Insufficient documentation

## 2015-06-10 DIAGNOSIS — Z79891 Long term (current) use of opiate analgesic: Secondary | ICD-10-CM | POA: Diagnosis not present

## 2015-06-10 DIAGNOSIS — K219 Gastro-esophageal reflux disease without esophagitis: Secondary | ICD-10-CM | POA: Diagnosis not present

## 2015-06-10 DIAGNOSIS — F329 Major depressive disorder, single episode, unspecified: Secondary | ICD-10-CM | POA: Insufficient documentation

## 2015-06-10 DIAGNOSIS — M542 Cervicalgia: Secondary | ICD-10-CM | POA: Insufficient documentation

## 2015-06-10 LAB — CBC WITH DIFFERENTIAL/PLATELET
BASOS ABS: 0 10*3/uL (ref 0.0–0.1)
BASOS PCT: 0 %
EOS ABS: 0 10*3/uL (ref 0.0–0.7)
EOS PCT: 0 %
HCT: 37 % (ref 36.0–46.0)
Hemoglobin: 11.9 g/dL — ABNORMAL LOW (ref 12.0–15.0)
Lymphocytes Relative: 6 %
Lymphs Abs: 1.1 10*3/uL (ref 0.7–4.0)
MCH: 29.6 pg (ref 26.0–34.0)
MCHC: 32.2 g/dL (ref 30.0–36.0)
MCV: 92 fL (ref 78.0–100.0)
Monocytes Absolute: 1.3 10*3/uL — ABNORMAL HIGH (ref 0.1–1.0)
Monocytes Relative: 7 %
Neutro Abs: 16.6 10*3/uL — ABNORMAL HIGH (ref 1.7–7.7)
Neutrophils Relative %: 87 %
PLATELETS: 377 10*3/uL (ref 150–400)
RBC: 4.02 MIL/uL (ref 3.87–5.11)
RDW: 14.8 % (ref 11.5–15.5)
WBC: 19.1 10*3/uL — AB (ref 4.0–10.5)

## 2015-06-10 LAB — CBG MONITORING, ED: Glucose-Capillary: 210 mg/dL — ABNORMAL HIGH (ref 65–99)

## 2015-06-10 LAB — COMPREHENSIVE METABOLIC PANEL
ALT: 9 U/L — AB (ref 14–54)
ANION GAP: 10 (ref 5–15)
AST: 12 U/L — AB (ref 15–41)
Albumin: 3.6 g/dL (ref 3.5–5.0)
Alkaline Phosphatase: 87 U/L (ref 38–126)
BUN: 32 mg/dL — ABNORMAL HIGH (ref 6–20)
CHLORIDE: 101 mmol/L (ref 101–111)
CO2: 24 mmol/L (ref 22–32)
Calcium: 9.3 mg/dL (ref 8.9–10.3)
Creatinine, Ser: 1.4 mg/dL — ABNORMAL HIGH (ref 0.44–1.00)
GFR calc non Af Amer: 34 mL/min — ABNORMAL LOW (ref >60)
GFR, EST AFRICAN AMERICAN: 40 mL/min — AB (ref >60)
Glucose, Bld: 236 mg/dL — ABNORMAL HIGH (ref 65–99)
POTASSIUM: 4.2 mmol/L (ref 3.5–5.1)
SODIUM: 135 mmol/L (ref 135–145)
Total Bilirubin: 0.6 mg/dL (ref 0.3–1.2)
Total Protein: 7.6 g/dL (ref 6.5–8.1)

## 2015-06-10 LAB — I-STAT TROPONIN, ED: TROPONIN I, POC: 0.01 ng/mL (ref 0.00–0.08)

## 2015-06-10 LAB — BRAIN NATRIURETIC PEPTIDE: B NATRIURETIC PEPTIDE 5: 126.7 pg/mL — AB (ref 0.0–100.0)

## 2015-06-10 MED ORDER — IPRATROPIUM-ALBUTEROL 0.5-2.5 (3) MG/3ML IN SOLN
3.0000 mL | RESPIRATORY_TRACT | Status: AC
  Administered 2015-06-10 (×2): 3 mL via RESPIRATORY_TRACT
  Filled 2015-06-10: qty 6

## 2015-06-10 MED ORDER — LIDOCAINE VISCOUS 2 % MT SOLN
15.0000 mL | Freq: Once | OROMUCOSAL | Status: AC
  Administered 2015-06-10: 15 mL via OROMUCOSAL
  Filled 2015-06-10: qty 15

## 2015-06-10 NOTE — ED Notes (Signed)
Patient states she has had upper respiratory issues since 02/2015. Patient states she has been on steroids, antibiotics, and albuterol inhaler with no relief. Patient states she now has left ear pain and continues to have a productive cough with yellow sputum.

## 2015-06-10 NOTE — ED Provider Notes (Signed)
CSN: [id number]     Arrival date & time 06/10/15  1355 History   First MD Initiated Contact with Patient 06/10/15 1500     Chief Complaint  Patient presents with  . Otalgia  . Cough     (Consider location/radiation/quality/duration/timing/severity/associated sxs/prior Treatment) HPI   82 year old female with a history of hypertension, diabetes, Crohn's disease, coronary artery disease, hx of goiter, presents with concern for pain in her neck with respirations. Symptoms have been present since February, and she's been treated for bronchitis several times with antibiotics and steroids. Describes severe pain, and points to trachea, as area of severe pain with respirations. Denies any change in voice, difficulty swallowing, drooling, sore throat. Symptoms have been present since February, however had worsening. They report she's had temporary improvement with steroids and breathing treatments. Patient denies any other feeling of shortness of breath, other than pain in her throat with breathing, and denies chest pain. Does report chronic cough. Left ear pain began today.  Past Medical History  Diagnosis Date  . Diabetes mellitus without complication ([place])   . Hypertension   . Crohn's disease ([place])   . Insomnia   . Hypothyroidism   . Goiter   . Osteoporosis   . Hyperlipidemia   . Depression   . Carpal tunnel syndrome   . Renal failure   . GERD (gastroesophageal reflux disease)   . Osteoarthritis   . Trochanteric bursitis   . Migraine   . Coronary artery disease 11/2014     cath with Prox RCA to Mid RCA lesion, 45% stenosed and Ost LAD to Prox LAD lesion, 30% stenosed byt recent cath and high calcium score by coronary CTA.   Past Surgical History  Procedure Laterality Date  . Abdominal hysterectomy    . Appendectomy    . Cardiac catheterization N/A 12/13/2014    Procedure: Left Heart Cath and Coronary Angiography;  Surgeon: Belva Crome, MD;  Location: [facility];  Service:  Cardiovascular;  Laterality: N/A;   Family History  Problem Relation Age of Onset  . Emphysema Father   . CAD Brother   . Heart disease Brother   . Heart attack Neg Hx   . Stroke Neg Hx    Social History  Substance Use Topics  . Smoking status: Current Some Day Smoker -- 0.25 packs/day    Types: Cigarettes  . Smokeless tobacco: None  . Alcohol Use: No   OB History    No data available     Review of Systems  Constitutional: Negative for fever.  HENT: Positive for ear pain (L). Negative for sore throat.   Eyes: Negative for visual disturbance.  Respiratory: Negative for cough and shortness of breath.   Cardiovascular: Negative for chest pain.  Gastrointestinal: Negative for nausea, vomiting and abdominal pain.  Genitourinary: Negative for difficulty urinating.  Musculoskeletal: Positive for neck pain (anterior). Negative for back pain.  Skin: Negative for rash.  Neurological: Negative for syncope and headaches.      Allergies  Atorvastatin; Byetta 10 mcg pen; Cefaclor; Codeine; Crestor; Dexilant; Erythromycin; Fosamax; Macrobid; Metformin and related; Nsaids; Penicillins; and Shellfish allergy  Home Medications   Prior to Admission medications   Medication Sig Start Date End Date Taking? Authorizing Provider  acetaminophen (TYLENOL) 500 MG tablet Take 1,000 mg by mouth daily as needed for headache.   Yes Historical Provider, MD  amitriptyline (ELAVIL) 25 MG tablet Take 25 mg by mouth at bedtime.   Yes Historical Provider, MD  aspirin  EC 81 MG tablet Take 1 tablet (81 mg total) by mouth daily. 01/10/15  Yes Sueanne Margarita, MD  beclomethasone (QVAR) 40 MCG/ACT inhaler Inhale 1 puff into the lungs 2 (two) times daily.   Yes Historical Provider, MD  Cholecalciferol (VITAMIN D-3) 1000 units CAPS Take 1,000 Units by mouth daily.   Yes Historical Provider, MD  clidinium-chlordiazePOXIDE (LIBRAX) 5-2.5 MG capsule Take 1 capsule by mouth every 4 (four) hours as needed (stomach  pain).   Yes Historical Provider, MD  diphenhydrAMINE (BENADRYL) 25 MG tablet Take 25 mg by mouth daily as needed for allergies.   Yes Historical Provider, MD  DULoxetine (CYMBALTA) 60 MG capsule Take 60 mg by mouth at bedtime.   Yes Historical Provider, MD  hydrochlorothiazide (HYDRODIURIL) 25 MG tablet Take 25 mg by mouth daily after breakfast.    Yes Historical Provider, MD  insulin glargine (LANTUS) 100 unit/mL SOPN Inject 18 Units into the skin daily after breakfast.    Yes Historical Provider, MD  insulin lispro (HUMALOG KWIKPEN) 100 UNIT/ML KiwkPen Inject 8 Units into the skin 2 (two) times daily.    Yes Historical Provider, MD  levothyroxine (SYNTHROID, LEVOTHROID) 125 MCG tablet Take 125 mcg by mouth daily before breakfast.   Yes Historical Provider, MD  losartan (COZAAR) 100 MG tablet Take 100 mg by mouth daily after breakfast.  08/31/14  Yes Historical Provider, MD  mesalamine (LIALDA) 1.2 G EC tablet Take 2.4 g by mouth daily after breakfast.   Yes Historical Provider, MD  nitroGLYCERIN (NITROSTAT) 0.4 MG SL tablet Place 1 tablet (0.4 mg total) under the tongue every 5 (five) minutes as needed for chest pain. 09/11/14  Yes Imogene Burn, PA-C  Omeprazole-Sodium Bicarbonate (ZEGERID OTC) 20-1100 MG CAPS capsule Take 1 capsule by mouth daily after breakfast.    Yes Historical Provider, MD  oxyCODONE-acetaminophen (PERCOCET) 10-325 MG tablet Take 1 tablet by mouth every 6 (six) hours as needed for pain.  12/26/14  Yes Historical Provider, MD  polyethylene glycol (MIRALAX / GLYCOLAX) packet Take 17 g by mouth daily as needed for mild constipation.   Yes Historical Provider, MD  Probiotic Product (ALIGN PO) Take 1 capsule by mouth daily after breakfast.   Yes Historical Provider, MD  simvastatin (ZOCOR) 10 MG tablet Take 10 mg by mouth at bedtime.  02/09/13  Yes Historical Provider, MD  sitaGLIPtin (JANUVIA) 50 MG tablet Take 50 mg by mouth daily after breakfast.    Yes Historical Provider, MD   sulfamethoxazole-trimethoprim (BACTRIM DS,SEPTRA DS) 800-160 MG tablet Take 1 tablet by mouth 2 (two) times daily.   Yes Historical Provider, MD  terconazole (TERAZOL 7) 0.4 % vaginal cream Place 1 applicator vaginally 2 (two) times daily as needed (infection/ irritation).  12/05/14  Yes Historical Provider, MD  albuterol (PROVENTIL HFA;VENTOLIN HFA) 108 (90 Base) MCG/ACT inhaler Inhale 1-2 puffs into the lungs every 6 (six) hours as needed for wheezing. Patient not taking: Reported on 06/10/2015 03/27/15   Tanna Furry, MD  benzonatate (TESSALON) 100 MG capsule Take 1 capsule (100 mg total) by mouth every 8 (eight) hours. Patient not taking: Reported on 06/10/2015 03/27/15   Tanna Furry, MD  guaiFENesin (MUCINEX) 600 MG 12 hr tablet Take 1 tablet (600 mg total) by mouth 2 (two) times daily. Patient not taking: Reported on 06/10/2015 03/27/15   Tanna Furry, MD  guaiFENesin-codeine [facility]) 100-10 MG/5ML syrup Take 5 mLs by mouth 3 (three) times daily as needed for cough. Patient not taking: Reported on  06/10/2015 03/27/15   Tanna Furry, MD  predniSONE (DELTASONE) 20 MG tablet Take 1 tablet (20 mg total) by mouth 2 (two) times daily with a meal. Patient not taking: Reported on 06/10/2015 03/27/15   Tanna Furry, MD   BP 128/115 mmHg  Pulse 106  Temp(Src) 99.4 F (37.4 C) (Oral)  Resp 19  Ht 5\' 6"  (1.676 m)  Wt 160 lb (72.576 kg)  BMI 25.84 kg/m2  SpO2 98% Physical Exam  Constitutional: She is oriented to person, place, and time. She appears well-developed and well-nourished. No distress.  HENT:  Head: Normocephalic and atraumatic.  Mouth/Throat: Uvula is midline. No trismus in the jaw. No uvula swelling. No posterior oropharyngeal edema or tonsillar abscesses.  Eyes: Conjunctivae and EOM are normal.  Neck: Normal range of motion. No edema present.  Cardiovascular: Normal rate, regular rhythm, normal heart sounds and intact distal pulses.  Exam reveals no gallop and no friction rub.   No murmur  heard. Pulmonary/Chest: Effort normal. No respiratory distress. She has wheezes (occasional). She has no rales.  No stridor   Abdominal: Soft. She exhibits no distension. There is no tenderness. There is no guarding.  Musculoskeletal: She exhibits no edema or tenderness.  Neurological: She is alert and oriented to person, place, and time.  Skin: Skin is warm and dry. No rash noted. She is not diaphoretic. No erythema.  Nursing note and vitals reviewed.   ED Course  Procedures (including critical care time) Labs Review Labs Reviewed  CBC WITH DIFFERENTIAL/PLATELET - Abnormal; Notable for the following:    WBC 19.1 (*)    Hemoglobin 11.9 (*)    Neutro Abs 16.6 (*)    Monocytes Absolute 1.3 (*)    All other components within normal limits  COMPREHENSIVE METABOLIC PANEL - Abnormal; Notable for the following:    Glucose, Bld 236 (*)    BUN 32 (*)    Creatinine, Ser 1.40 (*)    AST 12 (*)    ALT 9 (*)    GFR calc non Af Amer 34 (*)    GFR calc Af Amer 40 (*)    All other components within normal limits  BRAIN NATRIURETIC PEPTIDE - Abnormal; Notable for the following:    B Natriuretic Peptide 126.7 (*)    All other components within normal limits  CBG MONITORING, ED - Abnormal; Notable for the following:    Glucose-Capillary 210 (*)    All other components within normal limits  I-STAT TROPOININ, ED    Imaging Review Dg Chest 2 View  06/10/2015  CLINICAL DATA:  Middle upper chest pain when breaths; hx bronchitis, CAD; HTN; smoker EXAM: CHEST  2 VIEW COMPARISON:  03/27/2015 FINDINGS: The heart is mildly enlarged. There are no focal consolidations. Again noted is scarring within the lingula, stable in appearance. No pulmonary edema. IMPRESSION: 1. Cardiomegaly. 2. Stable lingular scarring. Electronically Signed   By: Nolon Nations M.D.   On: 06/10/2015 15:28   I have personally reviewed and evaluated these images and lab results as part of my medical decision-making.   EKG  Interpretation None      MDM   Final diagnoses:  Neck pain  Cough    82 year old female with a history of hypertension, diabetes, Crohn's disease, coronary artery disease, hx of goiter, presents with concern for pain in her neck with respirations. Symptoms have been present since February, and she's been treated for bronchitis several times with antibiotics and steroids. Patient does not have any dysphonia, no  dysphagia, no odynophagia, no stridor no drooling and have low suspicion for acute airway obstruction or angioedema. She is afebrile, and with symptoms of 3 months, have low suspicion for retropharyngeal abscess, epiglottitis, or other. There is no signs of oral or pharyngeal swelling on exam. Patient reports she's improved with breathing treatments in the past, and does exhibit some scattered wheezing on exam, and albuterol was given for which she reports very mild relief. Patient denies any true shortness of breath, and states that her chief concern is pain with breathing in her neck. Chest x-ray was obtained however which showed no acute findings. There are no signs of congestive heart failure. Doubt PE/ACS.  Patient with leukocytosis on labs, likely secondary to recent steroid use. She is afebrile and have low suspicion for other bacterial infection. Patient with tachycardia following albuterol treatments, feels likely secondary to albuterol and anxiety. Do feel repeat EKG shows sinus tachycardia low there is some artifact. Patient given lidocaine to gargle. Ear pain likely referred from possible neck pathology. No sign of otitis media.  Recommend ear nose and throat evaluation for evaluation of these symptoms.    Gareth Morgan, MD 06/11/15 [phone]

## 2015-06-12 DIAGNOSIS — M1990 Unspecified osteoarthritis, unspecified site: Secondary | ICD-10-CM | POA: Diagnosis not present

## 2015-06-12 DIAGNOSIS — Z79899 Other long term (current) drug therapy: Secondary | ICD-10-CM | POA: Diagnosis not present

## 2015-06-12 DIAGNOSIS — G894 Chronic pain syndrome: Secondary | ICD-10-CM | POA: Diagnosis not present

## 2015-06-12 DIAGNOSIS — M4697 Unspecified inflammatory spondylopathy, lumbosacral region: Secondary | ICD-10-CM | POA: Diagnosis not present

## 2015-06-29 DIAGNOSIS — M26602 Left temporomandibular joint disorder, unspecified: Secondary | ICD-10-CM | POA: Diagnosis not present

## 2015-06-29 DIAGNOSIS — R070 Pain in throat: Secondary | ICD-10-CM | POA: Diagnosis not present

## 2015-06-29 DIAGNOSIS — K219 Gastro-esophageal reflux disease without esophagitis: Secondary | ICD-10-CM | POA: Diagnosis not present

## 2015-06-29 DIAGNOSIS — H9202 Otalgia, left ear: Secondary | ICD-10-CM | POA: Insufficient documentation

## 2015-07-10 DIAGNOSIS — M4697 Unspecified inflammatory spondylopathy, lumbosacral region: Secondary | ICD-10-CM | POA: Diagnosis not present

## 2015-07-10 DIAGNOSIS — Z79899 Other long term (current) drug therapy: Secondary | ICD-10-CM | POA: Diagnosis not present

## 2015-07-10 DIAGNOSIS — G894 Chronic pain syndrome: Secondary | ICD-10-CM | POA: Diagnosis not present

## 2015-07-10 DIAGNOSIS — M1990 Unspecified osteoarthritis, unspecified site: Secondary | ICD-10-CM | POA: Diagnosis not present

## 2015-07-30 DIAGNOSIS — E039 Hypothyroidism, unspecified: Secondary | ICD-10-CM | POA: Diagnosis not present

## 2015-08-01 DIAGNOSIS — E039 Hypothyroidism, unspecified: Secondary | ICD-10-CM | POA: Diagnosis not present

## 2015-08-01 DIAGNOSIS — E119 Type 2 diabetes mellitus without complications: Secondary | ICD-10-CM | POA: Diagnosis not present

## 2015-08-01 DIAGNOSIS — Z794 Long term (current) use of insulin: Secondary | ICD-10-CM | POA: Diagnosis not present

## 2015-08-01 DIAGNOSIS — E049 Nontoxic goiter, unspecified: Secondary | ICD-10-CM | POA: Diagnosis not present

## 2015-08-01 DIAGNOSIS — M810 Age-related osteoporosis without current pathological fracture: Secondary | ICD-10-CM | POA: Diagnosis not present

## 2015-08-07 DIAGNOSIS — M549 Dorsalgia, unspecified: Secondary | ICD-10-CM | POA: Diagnosis not present

## 2015-08-07 DIAGNOSIS — M4697 Unspecified inflammatory spondylopathy, lumbosacral region: Secondary | ICD-10-CM | POA: Diagnosis not present

## 2015-08-07 DIAGNOSIS — G894 Chronic pain syndrome: Secondary | ICD-10-CM | POA: Diagnosis not present

## 2015-08-07 DIAGNOSIS — M1990 Unspecified osteoarthritis, unspecified site: Secondary | ICD-10-CM | POA: Diagnosis not present

## 2015-08-17 DIAGNOSIS — R1013 Epigastric pain: Secondary | ICD-10-CM | POA: Diagnosis not present

## 2015-08-24 DIAGNOSIS — R69 Illness, unspecified: Secondary | ICD-10-CM | POA: Diagnosis not present

## 2015-09-04 DIAGNOSIS — M549 Dorsalgia, unspecified: Secondary | ICD-10-CM | POA: Diagnosis not present

## 2015-09-04 DIAGNOSIS — M4697 Unspecified inflammatory spondylopathy, lumbosacral region: Secondary | ICD-10-CM | POA: Diagnosis not present

## 2015-09-04 DIAGNOSIS — G894 Chronic pain syndrome: Secondary | ICD-10-CM | POA: Diagnosis not present

## 2015-09-04 DIAGNOSIS — M1990 Unspecified osteoarthritis, unspecified site: Secondary | ICD-10-CM | POA: Diagnosis not present

## 2015-09-07 DIAGNOSIS — R197 Diarrhea, unspecified: Secondary | ICD-10-CM | POA: Diagnosis not present

## 2015-09-28 DIAGNOSIS — Z794 Long term (current) use of insulin: Secondary | ICD-10-CM | POA: Diagnosis not present

## 2015-09-28 DIAGNOSIS — E039 Hypothyroidism, unspecified: Secondary | ICD-10-CM | POA: Diagnosis not present

## 2015-09-28 DIAGNOSIS — E119 Type 2 diabetes mellitus without complications: Secondary | ICD-10-CM | POA: Diagnosis not present

## 2015-09-28 DIAGNOSIS — Z7984 Long term (current) use of oral hypoglycemic drugs: Secondary | ICD-10-CM | POA: Diagnosis not present

## 2015-10-04 DIAGNOSIS — G894 Chronic pain syndrome: Secondary | ICD-10-CM | POA: Diagnosis not present

## 2015-10-04 DIAGNOSIS — M1990 Unspecified osteoarthritis, unspecified site: Secondary | ICD-10-CM | POA: Diagnosis not present

## 2015-10-04 DIAGNOSIS — M549 Dorsalgia, unspecified: Secondary | ICD-10-CM | POA: Diagnosis not present

## 2015-10-04 DIAGNOSIS — M4697 Unspecified inflammatory spondylopathy, lumbosacral region: Secondary | ICD-10-CM | POA: Diagnosis not present

## 2015-10-08 DIAGNOSIS — Z23 Encounter for immunization: Secondary | ICD-10-CM | POA: Diagnosis not present

## 2015-10-08 DIAGNOSIS — M810 Age-related osteoporosis without current pathological fracture: Secondary | ICD-10-CM | POA: Diagnosis not present

## 2015-10-08 DIAGNOSIS — R69 Illness, unspecified: Secondary | ICD-10-CM | POA: Diagnosis not present

## 2015-10-08 DIAGNOSIS — Z794 Long term (current) use of insulin: Secondary | ICD-10-CM | POA: Diagnosis not present

## 2015-10-08 DIAGNOSIS — E119 Type 2 diabetes mellitus without complications: Secondary | ICD-10-CM | POA: Diagnosis not present

## 2015-10-08 DIAGNOSIS — E049 Nontoxic goiter, unspecified: Secondary | ICD-10-CM | POA: Diagnosis not present

## 2015-10-08 DIAGNOSIS — E039 Hypothyroidism, unspecified: Secondary | ICD-10-CM | POA: Diagnosis not present

## 2015-11-08 DIAGNOSIS — G894 Chronic pain syndrome: Secondary | ICD-10-CM | POA: Diagnosis not present

## 2015-11-08 DIAGNOSIS — M549 Dorsalgia, unspecified: Secondary | ICD-10-CM | POA: Diagnosis not present

## 2015-11-08 DIAGNOSIS — M1990 Unspecified osteoarthritis, unspecified site: Secondary | ICD-10-CM | POA: Diagnosis not present

## 2015-11-08 DIAGNOSIS — M4697 Unspecified inflammatory spondylopathy, lumbosacral region: Secondary | ICD-10-CM | POA: Diagnosis not present

## 2015-11-15 DIAGNOSIS — R600 Localized edema: Secondary | ICD-10-CM | POA: Diagnosis not present

## 2015-11-15 DIAGNOSIS — E039 Hypothyroidism, unspecified: Secondary | ICD-10-CM | POA: Diagnosis not present

## 2015-11-15 DIAGNOSIS — I509 Heart failure, unspecified: Secondary | ICD-10-CM | POA: Diagnosis not present

## 2015-11-26 DIAGNOSIS — M5126 Other intervertebral disc displacement, lumbar region: Secondary | ICD-10-CM | POA: Diagnosis not present

## 2015-11-26 DIAGNOSIS — R600 Localized edema: Secondary | ICD-10-CM | POA: Diagnosis not present

## 2015-11-26 DIAGNOSIS — M5125 Other intervertebral disc displacement, thoracolumbar region: Secondary | ICD-10-CM | POA: Diagnosis not present

## 2015-11-26 DIAGNOSIS — M438X5 Other specified deforming dorsopathies, thoracolumbar region: Secondary | ICD-10-CM | POA: Diagnosis not present

## 2015-12-06 DIAGNOSIS — M4697 Unspecified inflammatory spondylopathy, lumbosacral region: Secondary | ICD-10-CM | POA: Diagnosis not present

## 2015-12-06 DIAGNOSIS — M549 Dorsalgia, unspecified: Secondary | ICD-10-CM | POA: Diagnosis not present

## 2015-12-06 DIAGNOSIS — M1990 Unspecified osteoarthritis, unspecified site: Secondary | ICD-10-CM | POA: Diagnosis not present

## 2015-12-06 DIAGNOSIS — G894 Chronic pain syndrome: Secondary | ICD-10-CM | POA: Diagnosis not present

## 2015-12-12 DIAGNOSIS — R69 Illness, unspecified: Secondary | ICD-10-CM | POA: Diagnosis not present

## 2015-12-14 DIAGNOSIS — R69 Illness, unspecified: Secondary | ICD-10-CM | POA: Diagnosis not present

## 2016-01-02 ENCOUNTER — Encounter: Payer: Self-pay | Admitting: Cardiology

## 2016-01-03 DIAGNOSIS — M549 Dorsalgia, unspecified: Secondary | ICD-10-CM | POA: Diagnosis not present

## 2016-01-03 DIAGNOSIS — M4697 Unspecified inflammatory spondylopathy, lumbosacral region: Secondary | ICD-10-CM | POA: Diagnosis not present

## 2016-01-03 DIAGNOSIS — G894 Chronic pain syndrome: Secondary | ICD-10-CM | POA: Diagnosis not present

## 2016-01-03 DIAGNOSIS — M1990 Unspecified osteoarthritis, unspecified site: Secondary | ICD-10-CM | POA: Diagnosis not present

## 2016-01-03 DIAGNOSIS — Z79899 Other long term (current) drug therapy: Secondary | ICD-10-CM | POA: Diagnosis not present

## 2016-01-03 DIAGNOSIS — Z79891 Long term (current) use of opiate analgesic: Secondary | ICD-10-CM | POA: Diagnosis not present

## 2016-01-07 ENCOUNTER — Ambulatory Visit (INDEPENDENT_AMBULATORY_CARE_PROVIDER_SITE_OTHER): Payer: Medicare HMO | Admitting: Cardiology

## 2016-01-07 VITALS — BP 144/80 | HR 88 | Ht 65.0 in | Wt 137.0 lb

## 2016-01-07 DIAGNOSIS — E7800 Pure hypercholesterolemia, unspecified: Secondary | ICD-10-CM

## 2016-01-07 DIAGNOSIS — I2510 Atherosclerotic heart disease of native coronary artery without angina pectoris: Secondary | ICD-10-CM

## 2016-01-07 DIAGNOSIS — I10 Essential (primary) hypertension: Secondary | ICD-10-CM | POA: Diagnosis not present

## 2016-01-07 MED ORDER — NITROGLYCERIN 0.4 MG SL SUBL: 0.4000 mg | SUBLINGUAL_TABLET | SUBLINGUAL | 3 refills | Status: DC | PRN

## 2016-01-07 NOTE — Patient Instructions (Signed)
Medication Instructions:  Your physician recommends that you continue on your current medications as directed. Please refer to the Current Medication list given to you today.   Labwork: Your physician recommends that you return for FASTING lab work.  Testing/Procedures: None  Follow-Up: Your physician wants you to follow-up in: 1 year with Dr. Radford Pax. You will receive a reminder letter in the mail two months in advance. If you don't receive a letter, please call our office to schedule the follow-up appointment.   Any Other Special Instructions Will Be Listed Below (If Applicable).     If you need a refill on your cardiac medications before your next appointment, please call your pharmacy.

## 2016-01-07 NOTE — Progress Notes (Signed)
Cardiology Office Note    Date:  01/07/2016   ID:  Tammie West, DOB March 18, 1932, MRN [id number]  PCP:  [name], MD  Cardiologist:  Fransico Him, MD   Chief Complaint  Patient presents with  . Coronary Artery Disease  . Hypertension  . Hyperlipidemia    History of Present Illness:  Tammie West is a 82 y.o. female with a history of HTN, nonobstructive ASCAD with Prox RCA to Mid RCA lesion, 45% stenosed and Ost LAD to Prox LAD lesion, 30% stenosed by cath and high calcium score by coronary CTA. She also has dyslipidemia and continues with tobacco.  She says that she has a lot of anxiety with her chronic back pain and uses tobacco as a crutch.  She presents today for followup. She denies any chest pain or pressure, SOB, DOE, dizziness, palpitations or syncope.  She occasionally has some mild LE edema which is controlled.      Past Medical History:  Diagnosis Date  . Carpal tunnel syndrome   . Coronary artery disease 11/2014    cath with Prox RCA to Mid RCA lesion, 45% stenosed and Ost LAD to Prox LAD lesion, 30% stenosed byt recent cath and high calcium score by coronary CTA.  . Crohn's disease ([place])   . Depression   . Diabetes mellitus without complication ([place])   . GERD (gastroesophageal reflux disease)   . Goiter   . Hyperlipidemia   . Hypertension   . Hypothyroidism   . Insomnia   . Migraine   . Osteoarthritis   . Osteoporosis   . Renal failure   . Trochanteric bursitis     Past Surgical History:  Procedure Laterality Date  . ABDOMINAL HYSTERECTOMY    . APPENDECTOMY    . CARDIAC CATHETERIZATION N/A 12/13/2014   Procedure: Left Heart Cath and Coronary Angiography;  Surgeon: Belva Crome, MD;  Location: [facility];  Service: Cardiovascular;  Laterality: N/A;    Current Medications: Outpatient Medications Prior to Visit  Medication Sig Dispense Refill  . albuterol (PROVENTIL HFA;VENTOLIN HFA) 108 (90 Base) MCG/ACT inhaler  Inhale 1-2 puffs into the lungs every 6 (six) hours as needed for wheezing. 1 Inhaler 0  . amitriptyline (ELAVIL) 25 MG tablet Take 25 mg by mouth at bedtime.    Marland Kitchen aspirin EC 81 MG tablet Take 1 tablet (81 mg total) by mouth daily.    . diphenhydrAMINE (BENADRYL) 25 MG tablet Take 25 mg by mouth daily as needed for allergies.    Marland Kitchen guaiFENesin (MUCINEX) 600 MG 12 hr tablet Take 1 tablet (600 mg total) by mouth 2 (two) times daily. 30 tablet 0  . guaiFENesin-codeine (CHERATUSSIN AC) 100-10 MG/5ML syrup Take 5 mLs by mouth 3 (three) times daily as needed for cough. 120 mL 0  . insulin glargine (LANTUS) 100 unit/mL SOPN Inject 6 Units into the skin daily after breakfast.     . levothyroxine (SYNTHROID, LEVOTHROID) 125 MCG tablet Take 125 mcg by mouth daily before breakfast.    . losartan (COZAAR) 100 MG tablet Take 100 mg by mouth daily after breakfast.     . mesalamine (LIALDA) 1.2 G EC tablet Take 2.4 g by mouth daily after breakfast.    . nitroGLYCERIN (NITROSTAT) 0.4 MG SL tablet Place 1 tablet (0.4 mg total) under the tongue every 5 (five) minutes as needed for chest pain. 25 tablet 3  . oxyCODONE-acetaminophen (PERCOCET) 10-325 MG tablet Take 1 tablet by mouth every 6 (six)  hours as needed for pain.     . polyethylene glycol (MIRALAX / GLYCOLAX) packet Take 17 g by mouth daily as needed for mild constipation.    . Probiotic Product (ALIGN PO) Take 1 capsule by mouth daily after breakfast.    . simvastatin (ZOCOR) 10 MG tablet Take 10 mg by mouth at bedtime.     . sitaGLIPtin (JANUVIA) 50 MG tablet Take 50 mg by mouth daily after breakfast.     . terconazole (TERAZOL 7) 0.4 % vaginal cream Place 1 applicator vaginally 2 (two) times daily as needed (infection/ irritation).     . benzonatate (TESSALON) 100 MG capsule Take 1 capsule (100 mg total) by mouth every 8 (eight) hours. 21 capsule 0  . Cholecalciferol (VITAMIN D-3) 1000 units CAPS Take 1,000 Units by mouth daily.    .  clidinium-chlordiazePOXIDE (LIBRAX) 5-2.5 MG capsule Take 1 capsule by mouth every 4 (four) hours as needed (stomach pain).    . DULoxetine (CYMBALTA) 60 MG capsule Take 60 mg by mouth at bedtime.    . hydrochlorothiazide (HYDRODIURIL) 25 MG tablet Take 25 mg by mouth daily after breakfast.     . insulin lispro (HUMALOG KWIKPEN) 100 UNIT/ML KiwkPen Inject 8 Units into the skin 2 (two) times daily.     Earney Navy Bicarbonate (ZEGERID OTC) 20-1100 MG CAPS capsule Take 1 capsule by mouth daily after breakfast.     . sulfamethoxazole-trimethoprim (BACTRIM DS,SEPTRA DS) 800-160 MG tablet Take 1 tablet by mouth 2 (two) times daily.    Marland Kitchen acetaminophen (TYLENOL) 500 MG tablet Take 1,000 mg by mouth daily as needed for headache.    . beclomethasone (QVAR) 40 MCG/ACT inhaler Inhale 1 puff into the lungs 2 (two) times daily.    . predniSONE (DELTASONE) 20 MG tablet Take 1 tablet (20 mg total) by mouth 2 (two) times daily with a meal. (Patient not taking: Reported on 06/10/2015) 10 tablet 0   No facility-administered medications prior to visit.      Allergies:   Atorvastatin; Byetta 10 mcg pen [exenatide]; Cefaclor; Codeine; Crestor [rosuvastatin]; Dexilant [dexlansoprazole]; Erythromycin; Fosamax [alendronate sodium]; Macrobid [nitrofurantoin monohyd macro]; Metformin and related; Nsaids; Penicillins; and Shellfish allergy   Social History   Social History  . Marital status: Married    Spouse name: N/A  . Number of children: N/A  . Years of education: N/A   Social History Main Topics  . Smoking status: Current Some Day Smoker    Packs/day: 0.25    Types: Cigarettes  . Smokeless tobacco: Never Used  . Alcohol use No  . Drug use: No  . Sexual activity: Not on file   Other Topics Concern  . Not on file   Social History Narrative  . No narrative on file     Family History:  The patient's family history includes CAD in her brother; Emphysema in her father; Heart disease in her brother.    ROS:   Please see the history of present illness.    Review of Systems  Musculoskeletal: Positive for back pain.  Gastrointestinal: Positive for constipation and diarrhea.  Psychiatric/Behavioral: The patient is nervous/anxious.    All other systems reviewed and are negative.  No flowsheet data found.     PHYSICAL EXAM:   VS:  BP (!) 144/80   Pulse 88   Ht 5\' 5"  (1.651 m)   Wt 137 lb (62.1 kg)   BMI 22.80 kg/m    GEN: Well nourished, well developed, in no acute distress  HEENT: normal  Neck: no JVD, carotid bruits, or masses Cardiac: RRR; no murmurs, rubs, or gallops,no edema.  Intact distal pulses bilaterally.  Respiratory:  clear to auscultation bilaterally, normal work of breathing GI: soft, nontender, nondistended, + BS MS: no deformity or atrophy  Skin: warm and dry, no rash Neuro:  Alert and Oriented x 3, Strength and sensation are intact Psych: euthymic mood, full affect  Wt Readings from Last 3 Encounters:  01/07/16 137 lb (62.1 kg)  06/10/15 160 lb (72.6 kg)  03/27/15 172 lb (78 kg)      Studies/Labs Reviewed:   EKG:  EKG is not ordered today.    Recent Labs: 06/10/2015: ALT 9; B Natriuretic Peptide 126.7; BUN 32; Creatinine, Ser 1.40; Hemoglobin 11.9; Platelets 377; Potassium 4.2; Sodium 135   Lipid Panel    Component Value Date/Time   CHOL 114 (L) 01/15/2015 0919   TRIG 104 01/15/2015 0919   HDL 49 01/15/2015 0919   CHOLHDL 2.3 01/15/2015 0919   VLDL 21 01/15/2015 0919   LDLCALC 44 01/15/2015 0919    Additional studies/ records that were reviewed today include:  none    ASSESSMENT:    1. Coronary artery disease involving native coronary artery of native heart without angina pectoris   2. Essential hypertension   3. Pure hypercholesterolemia      PLAN:  In order of problems listed above:  1. ASCAD with nonobstructive ASCAD with Prox RCA to Mid RCA lesion, 45% stenosed and Ost LAD to Prox LAD lesion, 30% stenosed by cath.  She has no  angina.  Continue statin and ASA.  I refilled her SL NTG today.  2. HTN - BP controlled on current meds.  Continue ARB. 3. Hyperlipidemia - LDL goal < 70.  Continue statin.Check FLP and ALT.    Medication Adjustments/Labs and Tests Ordered: Current medicines are reviewed at length with the patient today.  Concerns regarding medicines are outlined above.  Medication changes, Labs and Tests ordered today are listed in the Patient Instructions below.  There are no Patient Instructions on file for this visit.   Signed, Fransico Him, MD  01/07/2016 1:26 PM    [facility] [place], [place], [place]  [address] Phone: [phone]; Fax: [phone]

## 2016-01-08 ENCOUNTER — Other Ambulatory Visit: Payer: Medicare HMO | Admitting: *Deleted

## 2016-01-08 DIAGNOSIS — E7800 Pure hypercholesterolemia, unspecified: Secondary | ICD-10-CM | POA: Diagnosis not present

## 2016-01-08 LAB — HEPATIC FUNCTION PANEL
ALK PHOS: 88 U/L (ref 33–130)
ALT: 10 U/L (ref 6–29)
AST: 15 U/L (ref 10–35)
Albumin: 3.6 g/dL (ref 3.6–5.1)
BILIRUBIN DIRECT: 0.1 mg/dL (ref <=0.2)
BILIRUBIN INDIRECT: 0.2 mg/dL (ref 0.2–1.2)
Total Bilirubin: 0.3 mg/dL (ref 0.2–1.2)
Total Protein: 6.3 g/dL (ref 6.1–8.1)

## 2016-01-08 LAB — LIPID PANEL
CHOL/HDL RATIO: 2 ratio (ref <5.0)
Cholesterol: 105 mg/dL (ref <200)
HDL: 53 mg/dL (ref >50)
LDL CALC: 34 mg/dL (ref <100)
Triglycerides: 91 mg/dL (ref <150)
VLDL: 18 mg/dL (ref <30)

## 2016-02-05 DIAGNOSIS — G894 Chronic pain syndrome: Secondary | ICD-10-CM | POA: Diagnosis not present

## 2016-02-05 DIAGNOSIS — M1990 Unspecified osteoarthritis, unspecified site: Secondary | ICD-10-CM | POA: Diagnosis not present

## 2016-02-05 DIAGNOSIS — Z79899 Other long term (current) drug therapy: Secondary | ICD-10-CM | POA: Diagnosis not present

## 2016-02-05 DIAGNOSIS — M549 Dorsalgia, unspecified: Secondary | ICD-10-CM | POA: Diagnosis not present

## 2016-02-05 DIAGNOSIS — M4697 Unspecified inflammatory spondylopathy, lumbosacral region: Secondary | ICD-10-CM | POA: Diagnosis not present

## 2016-02-05 DIAGNOSIS — Z79891 Long term (current) use of opiate analgesic: Secondary | ICD-10-CM | POA: Diagnosis not present

## 2016-02-06 DIAGNOSIS — I10 Essential (primary) hypertension: Secondary | ICD-10-CM | POA: Diagnosis not present

## 2016-02-06 DIAGNOSIS — E039 Hypothyroidism, unspecified: Secondary | ICD-10-CM | POA: Diagnosis not present

## 2016-02-06 DIAGNOSIS — Z720 Tobacco use: Secondary | ICD-10-CM | POA: Diagnosis not present

## 2016-02-06 DIAGNOSIS — G4700 Insomnia, unspecified: Secondary | ICD-10-CM | POA: Diagnosis not present

## 2016-02-06 DIAGNOSIS — Z7984 Long term (current) use of oral hypoglycemic drugs: Secondary | ICD-10-CM | POA: Diagnosis not present

## 2016-02-06 DIAGNOSIS — G894 Chronic pain syndrome: Secondary | ICD-10-CM | POA: Diagnosis not present

## 2016-02-06 DIAGNOSIS — J209 Acute bronchitis, unspecified: Secondary | ICD-10-CM | POA: Diagnosis not present

## 2016-02-06 DIAGNOSIS — M810 Age-related osteoporosis without current pathological fracture: Secondary | ICD-10-CM | POA: Diagnosis not present

## 2016-02-06 DIAGNOSIS — K5090 Crohn's disease, unspecified, without complications: Secondary | ICD-10-CM | POA: Diagnosis not present

## 2016-02-06 DIAGNOSIS — E119 Type 2 diabetes mellitus without complications: Secondary | ICD-10-CM | POA: Diagnosis not present

## 2016-02-19 DIAGNOSIS — R69 Illness, unspecified: Secondary | ICD-10-CM | POA: Diagnosis not present

## 2016-02-19 DIAGNOSIS — M810 Age-related osteoporosis without current pathological fracture: Secondary | ICD-10-CM | POA: Diagnosis not present

## 2016-02-19 DIAGNOSIS — Z7984 Long term (current) use of oral hypoglycemic drugs: Secondary | ICD-10-CM | POA: Diagnosis not present

## 2016-02-19 DIAGNOSIS — E559 Vitamin D deficiency, unspecified: Secondary | ICD-10-CM | POA: Diagnosis not present

## 2016-02-19 DIAGNOSIS — G894 Chronic pain syndrome: Secondary | ICD-10-CM | POA: Diagnosis not present

## 2016-02-19 DIAGNOSIS — E039 Hypothyroidism, unspecified: Secondary | ICD-10-CM | POA: Diagnosis not present

## 2016-02-19 DIAGNOSIS — E119 Type 2 diabetes mellitus without complications: Secondary | ICD-10-CM | POA: Diagnosis not present

## 2016-02-20 DIAGNOSIS — H5213 Myopia, bilateral: Secondary | ICD-10-CM | POA: Diagnosis not present

## 2016-02-20 DIAGNOSIS — Z0100 Encounter for examination of eyes and vision without abnormal findings: Secondary | ICD-10-CM | POA: Diagnosis not present

## 2016-03-04 DIAGNOSIS — G894 Chronic pain syndrome: Secondary | ICD-10-CM | POA: Diagnosis not present

## 2016-03-04 DIAGNOSIS — M1990 Unspecified osteoarthritis, unspecified site: Secondary | ICD-10-CM | POA: Diagnosis not present

## 2016-03-04 DIAGNOSIS — M4697 Unspecified inflammatory spondylopathy, lumbosacral region: Secondary | ICD-10-CM | POA: Diagnosis not present

## 2016-03-04 DIAGNOSIS — M549 Dorsalgia, unspecified: Secondary | ICD-10-CM | POA: Diagnosis not present

## 2016-03-04 DIAGNOSIS — Z79891 Long term (current) use of opiate analgesic: Secondary | ICD-10-CM | POA: Diagnosis not present

## 2016-03-04 DIAGNOSIS — Z79899 Other long term (current) drug therapy: Secondary | ICD-10-CM | POA: Diagnosis not present

## 2016-03-18 DIAGNOSIS — M810 Age-related osteoporosis without current pathological fracture: Secondary | ICD-10-CM | POA: Diagnosis not present

## 2016-03-18 DIAGNOSIS — R600 Localized edema: Secondary | ICD-10-CM | POA: Diagnosis not present

## 2016-03-18 DIAGNOSIS — Z23 Encounter for immunization: Secondary | ICD-10-CM | POA: Diagnosis not present

## 2016-03-18 DIAGNOSIS — R69 Illness, unspecified: Secondary | ICD-10-CM | POA: Diagnosis not present

## 2016-04-01 DIAGNOSIS — Z79899 Other long term (current) drug therapy: Secondary | ICD-10-CM | POA: Diagnosis not present

## 2016-04-01 DIAGNOSIS — G894 Chronic pain syndrome: Secondary | ICD-10-CM | POA: Diagnosis not present

## 2016-04-01 DIAGNOSIS — Z79891 Long term (current) use of opiate analgesic: Secondary | ICD-10-CM | POA: Diagnosis not present

## 2016-04-01 DIAGNOSIS — M1990 Unspecified osteoarthritis, unspecified site: Secondary | ICD-10-CM | POA: Diagnosis not present

## 2016-04-01 DIAGNOSIS — M4697 Unspecified inflammatory spondylopathy, lumbosacral region: Secondary | ICD-10-CM | POA: Diagnosis not present

## 2016-04-01 DIAGNOSIS — M549 Dorsalgia, unspecified: Secondary | ICD-10-CM | POA: Diagnosis not present

## 2016-04-15 DIAGNOSIS — G5603 Carpal tunnel syndrome, bilateral upper limbs: Secondary | ICD-10-CM | POA: Diagnosis not present

## 2016-04-29 DIAGNOSIS — Z79899 Other long term (current) drug therapy: Secondary | ICD-10-CM | POA: Diagnosis not present

## 2016-04-29 DIAGNOSIS — M549 Dorsalgia, unspecified: Secondary | ICD-10-CM | POA: Diagnosis not present

## 2016-04-29 DIAGNOSIS — M1990 Unspecified osteoarthritis, unspecified site: Secondary | ICD-10-CM | POA: Diagnosis not present

## 2016-04-29 DIAGNOSIS — G894 Chronic pain syndrome: Secondary | ICD-10-CM | POA: Diagnosis not present

## 2016-04-29 DIAGNOSIS — Z79891 Long term (current) use of opiate analgesic: Secondary | ICD-10-CM | POA: Diagnosis not present

## 2016-04-29 DIAGNOSIS — M4697 Unspecified inflammatory spondylopathy, lumbosacral region: Secondary | ICD-10-CM | POA: Diagnosis not present

## 2016-05-01 DIAGNOSIS — R600 Localized edema: Secondary | ICD-10-CM | POA: Diagnosis not present

## 2016-05-01 DIAGNOSIS — R635 Abnormal weight gain: Secondary | ICD-10-CM | POA: Diagnosis not present

## 2016-05-01 DIAGNOSIS — I10 Essential (primary) hypertension: Secondary | ICD-10-CM | POA: Diagnosis not present

## 2016-05-01 DIAGNOSIS — E039 Hypothyroidism, unspecified: Secondary | ICD-10-CM | POA: Diagnosis not present

## 2016-05-01 DIAGNOSIS — E1165 Type 2 diabetes mellitus with hyperglycemia: Secondary | ICD-10-CM | POA: Diagnosis not present

## 2016-05-01 DIAGNOSIS — Z794 Long term (current) use of insulin: Secondary | ICD-10-CM | POA: Diagnosis not present

## 2016-05-01 DIAGNOSIS — E119 Type 2 diabetes mellitus without complications: Secondary | ICD-10-CM | POA: Diagnosis not present

## 2016-05-01 DIAGNOSIS — Z6824 Body mass index (BMI) 24.0-24.9, adult: Secondary | ICD-10-CM | POA: Diagnosis not present

## 2016-05-01 DIAGNOSIS — M810 Age-related osteoporosis without current pathological fracture: Secondary | ICD-10-CM | POA: Diagnosis not present

## 2016-05-02 ENCOUNTER — Telehealth: Payer: Self-pay | Admitting: Cardiology

## 2016-05-02 NOTE — Telephone Encounter (Signed)
Patient reports 6 pound weight gain in 2 weeks and LE edema. She saw her PCP recently and was told to contact cardiology. She states her PCP gave her TED stockings and while she continues to wear them, they do not seem to be helping the swelling. She elevates her feet multiple times daily and limits her salt.  She states that the swelling is minimally better in the AM after she sleeps. She denies CP and SOB - her only complaint is "fat feet." She reports her BP and HR are "fine." Scheduling arranged appointment for patient 4/9 with B. Simmons. Patient understands to seek medical attention over the weekend if SOB occurs.  She was grateful for call.

## 2016-05-02 NOTE — Telephone Encounter (Signed)
New message   Pt c/o swelling: STAT is pt has developed SOB within 24 hours  1. How long have you been experiencing swelling? 1 month  2. Where is the swelling located? Legs and feet  3.  Are you currently taking a "fluid pill"? no  4.  Are you currently SOB? no  5.  Have you traveled recently? no

## 2016-05-05 ENCOUNTER — Ambulatory Visit (INDEPENDENT_AMBULATORY_CARE_PROVIDER_SITE_OTHER): Payer: Medicare HMO | Admitting: Cardiology

## 2016-05-05 ENCOUNTER — Encounter: Payer: Self-pay | Admitting: Cardiology

## 2016-05-05 VITALS — BP 112/54 | HR 85 | Ht 66.0 in | Wt 144.8 lb

## 2016-05-05 DIAGNOSIS — I5031 Acute diastolic (congestive) heart failure: Secondary | ICD-10-CM | POA: Diagnosis not present

## 2016-05-05 DIAGNOSIS — R600 Localized edema: Secondary | ICD-10-CM | POA: Diagnosis not present

## 2016-05-05 LAB — BASIC METABOLIC PANEL
BUN: 34 mg/dL — ABNORMAL HIGH (ref 7–25)
CALCIUM: 8.6 mg/dL (ref 8.6–10.4)
CO2: 25 mmol/L (ref 20–31)
CREATININE: 1.58 mg/dL — AB (ref 0.60–0.88)
Chloride: 106 mmol/L (ref 98–110)
GLUCOSE: 121 mg/dL — AB (ref 65–99)
Potassium: 4.4 mmol/L (ref 3.5–5.3)
Sodium: 141 mmol/L (ref 135–146)

## 2016-05-05 MED ORDER — FUROSEMIDE 40 MG PO TABS: 40.0000 mg | ORAL_TABLET | Freq: Every day | ORAL | 6 refills | Status: DC

## 2016-05-05 NOTE — Addendum Note (Signed)
Addended by: Kathyrn Lass on: 05/05/2016 03:30 PM   Modules accepted: Orders

## 2016-05-05 NOTE — Progress Notes (Signed)
05/05/2016 Tammie West   1932-02-19  [id number]  Primary Physician [name], MD Primary Cardiologist: Dr. Radford Pax    Reason for Visit/CC: Bilateral LEE   HPI:  Tammie West is a 83 y.o. female, with a h/o HTN, mild nonobstructive ASCAD with Prox RCA to Mid RCA lesion, 45% stenosed and Ost LAD to Prox LAD lesion, 30% stenosed by cath in 2016, normal LVEF by echo in 2015, mild mitral regurgitation and h/o tobacco use, who is being seen today for the evaluation of bilateral LEE. Symptoms have been present x 2 weeks and worsening. She recently saw her PCP at [facility]. Per patient, she was told that her BNP was elevated. She was taken off of HCTZ and started on Lasix, 20 mg daily, and was instructed to f/u in our office for further evaluation.   She is here in clinic today with her daughter. She has had some mild improvement with the Lasix however still with significant edema. She has 2+ bilateral lower extremity pitting edema on exam. She also has mild erythema on the dorsal aspect of her right foot. There is slight increased warmth bilaterally however she denies any severe pain. No recent prolonged travel or injury. She also denies dyspnea and chest pain. No orthopnea or PND. She reports a strict adherence to a low salt diet regimen.    Current Meds  Medication Sig  . albuterol (PROVENTIL HFA;VENTOLIN HFA) 108 (90 Base) MCG/ACT inhaler Inhale 1-2 puffs into the lungs every 6 (six) hours as needed for wheezing.  Marland Kitchen amitriptyline (ELAVIL) 25 MG tablet Take 25 mg by mouth at bedtime.  Marland Kitchen aspirin EC 81 MG tablet Take 1 tablet (81 mg total) by mouth daily.  . diphenhydrAMINE (BENADRYL) 25 MG tablet Take 25 mg by mouth daily as needed for allergies.  Marland Kitchen guaiFENesin (MUCINEX) 600 MG 12 hr tablet Take 1 tablet (600 mg total) by mouth 2 (two) times daily.  Marland Kitchen guaiFENesin-codeine (CHERATUSSIN AC) 100-10 MG/5ML syrup Take 5 mLs by mouth 3 (three) times daily as  needed for cough.  . insulin glargine (LANTUS) 100 unit/mL SOPN Inject 6 Units into the skin daily after breakfast.   . levothyroxine (SYNTHROID, LEVOTHROID) 125 MCG tablet Take 125 mcg by mouth daily before breakfast.  . losartan (COZAAR) 100 MG tablet Take 100 mg by mouth daily after breakfast.   . mesalamine (LIALDA) 1.2 G EC tablet Take 2.4 g by mouth daily after breakfast.  . nitroGLYCERIN (NITROSTAT) 0.4 MG SL tablet Place 1 tablet (0.4 mg total) under the tongue every 5 (five) minutes as needed for chest pain.  Marland Kitchen oxyCODONE-acetaminophen (PERCOCET) 10-325 MG tablet Take 1 tablet by mouth every 6 (six) hours as needed for pain.   . polyethylene glycol (MIRALAX / GLYCOLAX) packet Take 17 g by mouth daily as needed for mild constipation.  . Probiotic Product (ALIGN PO) Take 1 capsule by mouth daily after breakfast.  . simvastatin (ZOCOR) 10 MG tablet Take 10 mg by mouth at bedtime.   . sitaGLIPtin (JANUVIA) 50 MG tablet Take 50 mg by mouth daily after breakfast.   . terconazole (TERAZOL 7) 0.4 % vaginal cream Place 1 applicator vaginally 2 (two) times daily as needed (infection/ irritation).    Allergies  Allergen Reactions  . Atorvastatin Nausea And Vomiting  . Byetta 10 Mcg Pen [Exenatide] Nausea And Vomiting  . Cefaclor Other (See Comments)    Pt does not remember the reaction  . Codeine Nausea And Vomiting       .  Crestor [Rosuvastatin] Nausea And Vomiting  . Dexilant [Dexlansoprazole] Diarrhea  . Erythromycin Hives and Swelling  . Fosamax [Alendronate Sodium] Other (See Comments)    GI intolerance  . Macrobid [employer] Macro] Other (See Comments)    Pt does not remember the reaction  . Metformin And Related Nausea And Vomiting  . Nsaids     Elevated creatinine   . Penicillins Hives and Swelling    Has patient had a PCN reaction causing immediate rash, facial/tongue/throat swelling, SOB or lightheadedness with hypotension: Yes Has patient had a PCN reaction  causing severe rash involving mucus membranes or skin necrosis: No Has patient had a PCN reaction that required hospitalization No Has patient had a PCN reaction occurring within the last 10 years: No If all of the above answers are "NO", then may proceed with Cephalosporin use.  . Shellfish Allergy Nausea And Vomiting and Other (See Comments)    Felt spaced out   Past Medical History:  Diagnosis Date  . Carpal tunnel syndrome   . Coronary artery disease 11/2014    cath with Prox RCA to Mid RCA lesion, 45% stenosed and Ost LAD to Prox LAD lesion, 30% stenosed byt recent cath and high calcium score by coronary CTA.  . Crohn's disease ([place])   . Depression   . Diabetes mellitus without complication ([place])   . GERD (gastroesophageal reflux disease)   . Goiter   . Hyperlipidemia   . Hypertension   . Hypothyroidism   . Insomnia   . Migraine   . Osteoarthritis   . Osteoporosis   . Renal failure   . Trochanteric bursitis    Family History  Problem Relation Age of Onset  . Emphysema Father   . CAD Brother   . Heart disease Brother   . Heart attack Neg Hx   . Stroke Neg Hx    Past Surgical History:  Procedure Laterality Date  . ABDOMINAL HYSTERECTOMY    . APPENDECTOMY    . CARDIAC CATHETERIZATION N/A 12/13/2014   Procedure: Left Heart Cath and Coronary Angiography;  Surgeon: Belva Crome, MD;  Location: [facility];  Service: Cardiovascular;  Laterality: N/A;   Social History   Social History  . Marital status: Married    Spouse name: N/A  . Number of children: N/A  . Years of education: N/A   Occupational History  . Not on file.   Social History Main Topics  . Smoking status: Current Some Day Smoker    Packs/day: 0.25    Types: Cigarettes  . Smokeless tobacco: Never Used  . Alcohol use No  . Drug use: No  . Sexual activity: Not on file   Other Topics Concern  . Not on file   Social History Narrative  . No narrative on file     Review of  Systems: General: negative for chills, fever, night sweats or weight changes.  Cardiovascular: negative for chest pain, dyspnea on exertion, edema, orthopnea, palpitations, paroxysmal nocturnal dyspnea or shortness of breath Dermatological: negative for rash Respiratory: negative for cough or wheezing Urologic: negative for hematuria Abdominal: negative for nausea, vomiting, diarrhea, bright red blood per rectum, melena, or hematemesis Neurologic: negative for visual changes, syncope, or dizziness All other systems reviewed and are otherwise negative except as noted above.   Physical Exam:  Blood pressure (!) 112/54, pulse 85, height 5\' 6"  (1.676 m), weight 144 lb 12.8 oz (65.7 kg).  General appearance: alert, cooperative and no distress Neck: no carotid  bruit and no JVD Lungs: clear to auscultation bilaterally Heart: regular rate and rhythm, S1, S2 normal, no murmur, click, rub or gallop Extremities: 2+ bilateral LE pitting edema, mild erthtyhema on dosal aspect of right foot Pulses: 2+ and symmetric Skin: Skin color, texture, turgor normal. No rashes or lesions Neurologic: Grossly normal  EKG not performed  ASSESSMENT AND PLAN:   1. Bilateral LEE Pitting Edema: suspect acute diastolic CHF, however we will check a D-dimer to r/o DVT. If abnormal d-dimer, we will order bilateral LE venous dopplers. We will also check a BNP and BMP. Increase Lasix to 40 mg daily. Pt encouraged to elevate legs to assist with edema. Continue to avoid sodium. We will also obtain a 2D echo to reassess LVEF to ensure no change in systolic function, since her last study in 2015. F/u with an APP in 1 week. She will also need a repeat BMP in 1 week to monitor renal function and K. If no significant improvement, will need to increase Lasix to BID dosing.   Swan Fairfax Ladoris Gene, M[facility] 05/05/2016 3:20 PM

## 2016-05-05 NOTE — Patient Instructions (Signed)
Schedule echocardiogram    Lab work today ( bmet,bnp.d dimer )    Increase Lasix to 40 mg daily    Continue to elevated legs as much as you can    Your physician recommends that you schedule a follow-up appointment with extender on Dr.Turner's team after echo

## 2016-05-06 ENCOUNTER — Encounter (HOSPITAL_COMMUNITY): Payer: Self-pay | Admitting: Cardiology

## 2016-05-06 ENCOUNTER — Telehealth: Payer: Self-pay | Admitting: *Deleted

## 2016-05-06 ENCOUNTER — Ambulatory Visit (HOSPITAL_COMMUNITY)
Admission: RE | Admit: 2016-05-06 | Discharge: 2016-05-06 | Disposition: A | Discharge status: Home / Self Care | Payer: Medicare HMO | Source: Ambulatory Visit | Attending: Cardiovascular Disease | Admitting: Cardiovascular Disease

## 2016-05-06 DIAGNOSIS — R7989 Other specified abnormal findings of blood chemistry: Secondary | ICD-10-CM | POA: Insufficient documentation

## 2016-05-06 DIAGNOSIS — M7989 Other specified soft tissue disorders: Secondary | ICD-10-CM | POA: Diagnosis not present

## 2016-05-06 LAB — D-DIMER, QUANTITATIVE: D-Dimer, Quant: 0.97 mcg/mL FEU — ABNORMAL HIGH (ref <0.50)

## 2016-05-06 LAB — BRAIN NATRIURETIC PEPTIDE: Brain Natriuretic Peptide: 179.1 pg/mL — ABNORMAL HIGH (ref <100)

## 2016-05-06 NOTE — Telephone Encounter (Signed)
-----   Message from [place], [place] sent at 05/06/2016 12:54 PM EDT ----- BNP is elevated, suggesting some of her LE edema is from CHF. Continue with plans to increase Lasix from 20 mg to 40 mg daily. D-dimer is also abnormal. She will need bilateral LE venous dopplers to rule out DVT. Please notify patient and arrange dopplers ASAP.

## 2016-05-06 NOTE — Telephone Encounter (Signed)
Called to let pt know that her preliminary LE Dopplers are normal. Pt understands that she will get a call when the results are officially in.  Pt verbalized understanding and appreciation.

## 2016-05-06 NOTE — Progress Notes (Signed)
Venous duplex negative for DVT. 

## 2016-05-07 DIAGNOSIS — E039 Hypothyroidism, unspecified: Secondary | ICD-10-CM | POA: Diagnosis not present

## 2016-05-07 DIAGNOSIS — Z794 Long term (current) use of insulin: Secondary | ICD-10-CM | POA: Diagnosis not present

## 2016-05-07 DIAGNOSIS — E119 Type 2 diabetes mellitus without complications: Secondary | ICD-10-CM | POA: Diagnosis not present

## 2016-05-07 DIAGNOSIS — M810 Age-related osteoporosis without current pathological fracture: Secondary | ICD-10-CM | POA: Diagnosis not present

## 2016-05-07 DIAGNOSIS — E049 Nontoxic goiter, unspecified: Secondary | ICD-10-CM | POA: Diagnosis not present

## 2016-05-07 DIAGNOSIS — R609 Edema, unspecified: Secondary | ICD-10-CM | POA: Diagnosis not present

## 2016-05-09 DIAGNOSIS — N183 Chronic kidney disease, stage 3 (moderate): Secondary | ICD-10-CM | POA: Diagnosis not present

## 2016-05-09 DIAGNOSIS — N2889 Other specified disorders of kidney and ureter: Secondary | ICD-10-CM | POA: Diagnosis not present

## 2016-05-19 ENCOUNTER — Ambulatory Visit (HOSPITAL_COMMUNITY): Payer: Medicare HMO | Attending: Cardiology

## 2016-05-19 ENCOUNTER — Other Ambulatory Visit: Payer: Self-pay

## 2016-05-19 DIAGNOSIS — R600 Localized edema: Secondary | ICD-10-CM

## 2016-05-19 DIAGNOSIS — I340 Nonrheumatic mitral (valve) insufficiency: Secondary | ICD-10-CM | POA: Diagnosis not present

## 2016-05-19 DIAGNOSIS — I5031 Acute diastolic (congestive) heart failure: Secondary | ICD-10-CM | POA: Insufficient documentation

## 2016-05-21 ENCOUNTER — Encounter: Payer: Self-pay | Admitting: Cardiology

## 2016-05-21 ENCOUNTER — Ambulatory Visit (INDEPENDENT_AMBULATORY_CARE_PROVIDER_SITE_OTHER): Payer: Medicare HMO | Admitting: Cardiology

## 2016-05-21 VITALS — BP 116/76 | HR 74 | Ht 66.0 in | Wt 134.8 lb

## 2016-05-21 DIAGNOSIS — I5032 Chronic diastolic (congestive) heart failure: Secondary | ICD-10-CM

## 2016-05-21 DIAGNOSIS — Z79899 Other long term (current) drug therapy: Secondary | ICD-10-CM

## 2016-05-21 NOTE — Patient Instructions (Signed)
Your physician recommends that you continue on your current medications as directed. Please refer to the Current Medication list given to you today.  Your physician recommends that you return for lab work TODAY - BMET  Weigh daily. Call [phone] if weight climbs more than 3 pounds in a day or 5 pounds in a week. -- if you gain more than 3lbs in 1 day, then take extra dose of lasix No salt to very little salt in your diet.  No more than 2000 mg in a day. Call if increased shortness of breath or increased swelling.  You are due for an appointment w/Dr. Radford Pax in December. You will receive a notification via MyChart when it is time to call to schedule this appointment.    DASH Eating Plan DASH stands for "Dietary Approaches to Stop Hypertension." The DASH eating plan is a healthy eating plan that has been shown to reduce high blood pressure (hypertension). It may also reduce your risk for type 2 diabetes, heart disease, and stroke. The DASH eating plan may also help with weight loss. What are tips for following this plan? General guidelines   Avoid eating more than 2,000 mg (milligrams) of salt (sodium) a day. If you have hypertension, you may need to reduce your sodium intake to 1,500 mg a day.  Limit alcohol intake to no more than 1 drink a day for nonpregnant women and 2 drinks a day for men. One drink equals 12 oz of beer, 5 oz of wine, or 1 oz of hard liquor.  Work with your health care provider to maintain a healthy body weight or to lose weight. Ask what an ideal weight is for you.  Get at least 30 minutes of exercise that causes your heart to beat faster (aerobic exercise) most days of the week. Activities may include walking, swimming, or biking.  Work with your health care provider or diet and nutrition specialist (dietitian) to adjust your eating plan to your individual calorie needs. Reading food labels   Check food labels for the amount of sodium per serving. Choose foods  with less than 5 percent of the Daily Value of sodium. Generally, foods with less than 300 mg of sodium per serving fit into this eating plan.  To find whole grains, look for the word "whole" as the first word in the ingredient list. Shopping   Buy products labeled as "low-sodium" or "no salt added."  Buy fresh foods. Avoid canned foods and premade or frozen meals. Cooking   Avoid adding salt when cooking. Use salt-free seasonings or herbs instead of table salt or sea salt. Check with your health care provider or pharmacist before using salt substitutes.  Do not fry foods. Cook foods using healthy methods such as baking, boiling, grilling, and broiling instead.  Cook with heart-healthy oils, such as olive, canola, soybean, or sunflower oil. Meal planning    Eat a balanced diet that includes:  5 or more servings of fruits and vegetables each day. At each meal, try to fill half of your plate with fruits and vegetables.  Up to 6-8 servings of whole grains each day.  Less than 6 oz of lean meat, poultry, or fish each day. A 3-oz serving of meat is about the same size as a deck of cards. One egg equals 1 oz.  2 servings of low-fat dairy each day.  A serving of nuts, seeds, or beans 5 times each week.  Heart-healthy fats. Healthy fats called Omega-3 fatty acids are  found in foods such as flaxseeds and coldwater fish, like sardines, salmon, and mackerel.  Limit how much you eat of the following:  Canned or prepackaged foods.  Food that is high in trans fat, such as fried foods.  Food that is high in saturated fat, such as fatty meat.  Sweets, desserts, sugary drinks, and other foods with added sugar.  Full-fat dairy products.  Do not salt foods before eating.  Try to eat at least 2 vegetarian meals each week.  Eat more home-cooked food and less restaurant, buffet, and fast food.  When eating at a restaurant, ask that your food be prepared with less salt or no salt, if  possible. What foods are recommended? The items listed may not be a complete list. Talk with your dietitian about what dietary choices are best for you. Grains  Whole-grain or whole-wheat bread. Whole-grain or whole-wheat pasta. Brown rice. [place]ena Morrow. Bulgur. Whole-grain and low-sodium cereals. Pita bread. Low-fat, low-sodium crackers. Whole-wheat flour tortillas. Vegetables  Fresh or frozen vegetables (raw, steamed, roasted, or grilled). Low-sodium or reduced-sodium tomato and vegetable juice. Low-sodium or reduced-sodium tomato sauce and tomato paste. Low-sodium or reduced-sodium canned vegetables. Fruits  All fresh, dried, or frozen fruit. Canned fruit in natural juice (without added sugar). Meat and other protein foods  Skinless chicken or [place]. Ground chicken or [place]. Pork with fat trimmed off. Fish and seafood. Egg whites. Dried beans, peas, or lentils. Unsalted nuts, nut butters, and seeds. Unsalted canned beans. Lean cuts of beef with fat trimmed off. Low-sodium, lean deli meat. Dairy  Low-fat (1%) or fat-free (skim) milk. Fat-free, low-fat, or reduced-fat cheeses. Nonfat, low-sodium ricotta or cottage cheese. Low-fat or nonfat yogurt. Low-fat, low-sodium cheese. Fats and oils  Soft margarine without trans fats. Vegetable oil. Low-fat, reduced-fat, or light mayonnaise and salad dressings (reduced-sodium). Canola, safflower, olive, soybean, and sunflower oils. Avocado. Seasoning and other foods  Herbs. Spices. Seasoning mixes without salt. Unsalted popcorn and pretzels. Fat-free sweets. What foods are not recommended? The items listed may not be a complete list. Talk with your dietitian about what dietary choices are best for you. Grains  Baked goods made with fat, such as croissants, muffins, or some breads. Dry pasta or rice meal packs. Vegetables  Creamed or fried vegetables. Vegetables in a cheese sauce. Regular canned vegetables (not low-sodium or reduced-sodium). Regular  canned tomato sauce and paste (not low-sodium or reduced-sodium). Regular tomato and vegetable juice (not low-sodium or reduced-sodium). Angie Fava. Olives. Fruits  Canned fruit in a light or heavy syrup. Fried fruit. Fruit in cream or butter sauce. Meat and other protein foods  Fatty cuts of meat. Ribs. Fried meat. Berniece Salines. Sausage. Bologna and other processed lunch meats. Salami. Fatback. Hotdogs. Bratwurst. Salted nuts and seeds. Canned beans with added salt. Canned or smoked fish. Whole eggs or egg yolks. Chicken or [place] with skin. Dairy  Whole or 2% milk, cream, and half-and-half. Whole or full-fat cream cheese. Whole-fat or sweetened yogurt. Full-fat cheese. Nondairy creamers. Whipped toppings. Processed cheese and cheese spreads. Fats and oils  Butter. Stick margarine. Lard. Shortening. Ghee. Bacon fat. Tropical oils, such as coconut, palm kernel, or palm oil. Seasoning and other foods  Salted popcorn and pretzels. Onion salt, garlic salt, seasoned salt, table salt, and sea salt. Worcestershire sauce. Tartar sauce. Barbecue sauce. Teriyaki sauce. Soy sauce, including reduced-sodium. Steak sauce. Canned and packaged gravies. Fish sauce. Oyster sauce. Cocktail sauce. Horseradish that you find on the shelf. Ketchup. Mustard. Meat flavorings and tenderizers. Bouillon cubes.  Hot sauce and Tabasco sauce. Premade or packaged marinades. Premade or packaged taco seasonings. Relishes. Regular salad dressings. Where to find more information:  National Heart, Lung, and [place]: https://wilson-eaton.com/  American Heart Association: www.heart.org Summary  The DASH eating plan is a healthy eating plan that has been shown to reduce high blood pressure (hypertension). It may also reduce your risk for type 2 diabetes, heart disease, and stroke.  With the DASH eating plan, you should limit salt (sodium) intake to 2,300 mg a day. If you have hypertension, you may need to reduce your sodium intake to 1,500 mg a  day.  When on the DASH eating plan, aim to eat more fresh fruits and vegetables, whole grains, lean proteins, low-fat dairy, and heart-healthy fats.  Work with your health care provider or diet and nutrition specialist (dietitian) to adjust your eating plan to your individual calorie needs. This information is not intended to replace advice given to you by your health care provider. Make sure you discuss any questions you have with your health care provider. Document Released: 01/02/2011 Document Revised: 01/07/2016 Document Reviewed: 01/07/2016 Elsevier Interactive Patient Education  2017 [employer].

## 2016-05-21 NOTE — Progress Notes (Signed)
05/21/2016 Tammie West   Apr 08, 1932  [id number]  Primary Physician [name], MD Primary Cardiologist: Dr. Radford Pax   Reason for Visit/CC: F/u for Acute on Chronic Diastolic HF  HPI:  Tammie West is a 83 y.o. female, with a h/o HTN, mild nonobstructive ASCAD with Prox RCA to Mid RCA lesion, 45% stenosed and Ost LAD to Prox LAD lesion, 30% stenosed by cath in 2016, normal LVEF by echo in 2015, mild mitral regurgitation, h/o tobacco use and diastolic HF who presents back to clinic for f/u. She is followed by Dr Radford Pax.   I evaluated her 2 weeks ago on 05/05/16 for bilateral LEE. Symptoms had been present x 2 weeks and worsening. She recently saw her PCP at [facility]. Per patient, she was told that her BNP was elevated. She was taken off of HCTZ and started on Lasix, 20 mg daily, and was instructed to f/u in our office for further evaluation.   When I saw her on 4/9, both legs were edematous with erythema. I ordered a d-dimer to r/o DVT. This was positive. Subsequently, she was was sent for LEE venous dopplers but DVT was ruled out. I advised her to increase her Lasix from 20 mg to 40 mg daily for edema. I also ordered a 2D echo to assess LVF. This was performed 05/19/16>> Normal LV size with EF 60-65%. Normal RV size and systolic function. Moderate mitral regurgitation, this has progressed. Previously mild in 2015.   She is back today with her daughter. Her LEE is much improved, back to normal. No dyspnea. No CP. BP is stable.   Current Meds  Medication Sig  . amitriptyline (ELAVIL) 25 MG tablet Take 25 mg by mouth at bedtime.  Marland Kitchen aspirin EC 81 MG tablet Take 1 tablet (81 mg total) by mouth daily.  . furosemide (LASIX) 40 MG tablet Take 1 tablet (40 mg total) by mouth daily.  . insulin glargine (LANTUS) 100 unit/mL SOPN Inject 6 Units into the skin daily after breakfast.   . levothyroxine (SYNTHROID, LEVOTHROID) 125 MCG tablet Take 125 mcg by mouth daily  before breakfast.  . losartan (COZAAR) 100 MG tablet Take 100 mg by mouth daily after breakfast.   . mesalamine (LIALDA) 1.2 G EC tablet Take 2.4 g by mouth daily after breakfast.  . Morphine-Naltrexone (EMBEDA) 30-1.2 MG CPCR Take by mouth.  . nitroGLYCERIN (NITROSTAT) 0.4 MG SL tablet Place 1 tablet (0.4 mg total) under the tongue every 5 (five) minutes as needed for chest pain.  . polyethylene glycol (MIRALAX / GLYCOLAX) packet Take 17 g by mouth daily as needed for mild constipation.  . Probiotic Product (ALIGN PO) Take 1 capsule by mouth daily after breakfast.  . simvastatin (ZOCOR) 10 MG tablet Take 10 mg by mouth at bedtime.   . sitaGLIPtin (JANUVIA) 50 MG tablet Take 50 mg by mouth daily after breakfast.   . [DISCONTINUED] terconazole (TERAZOL 7) 0.4 % vaginal cream Place 1 applicator vaginally 2 (two) times daily as needed (infection/ irritation).    Allergies  Allergen Reactions  . Atorvastatin Nausea And Vomiting  . Byetta 10 Mcg Pen [Exenatide] Nausea And Vomiting  . Cefaclor Other (See Comments)    Pt does not remember the reaction  . Codeine Nausea And Vomiting       . Crestor [Rosuvastatin] Nausea And Vomiting  . Dexilant [Dexlansoprazole] Diarrhea  . Erythromycin Hives and Swelling  . Fosamax [Alendronate Sodium] Other (See Comments)    GI intolerance  .  Macrobid [employer] Macro] Other (See Comments)    Pt does not remember the reaction  . Metformin And Related Nausea And Vomiting  . Nsaids     Elevated creatinine   . Penicillins Hives and Swelling    Has patient had a PCN reaction causing immediate rash, facial/tongue/throat swelling, SOB or lightheadedness with hypotension: Yes Has patient had a PCN reaction causing severe rash involving mucus membranes or skin necrosis: No Has patient had a PCN reaction that required hospitalization No Has patient had a PCN reaction occurring within the last 10 years: No If all of the above answers are "NO", then  may proceed with Cephalosporin use.  . Shellfish Allergy Nausea And Vomiting and Other (See Comments)    Felt spaced out   Past Medical History:  Diagnosis Date  . Carpal tunnel syndrome   . Coronary artery disease 11/2014    cath with Prox RCA to Mid RCA lesion, 45% stenosed and Ost LAD to Prox LAD lesion, 30% stenosed byt recent cath and high calcium score by coronary CTA.  . Crohn's disease ([place])   . Depression   . Diabetes mellitus without complication ([place])   . GERD (gastroesophageal reflux disease)   . Goiter   . Hyperlipidemia   . Hypertension   . Hypothyroidism   . Insomnia   . Migraine   . Osteoarthritis   . Osteoporosis   . Renal failure   . Trochanteric bursitis    Family History  Problem Relation Age of Onset  . Emphysema Father   . CAD Brother   . Heart disease Brother   . Heart attack Neg Hx   . Stroke Neg Hx    Past Surgical History:  Procedure Laterality Date  . ABDOMINAL HYSTERECTOMY    . APPENDECTOMY    . CARDIAC CATHETERIZATION N/A 12/13/2014   Procedure: Left Heart Cath and Coronary Angiography;  Surgeon: Belva Crome, MD;  Location: [facility];  Service: Cardiovascular;  Laterality: N/A;   Social History   Social History  . Marital status: Married    Spouse name: N/A  . Number of children: N/A  . Years of education: N/A   Occupational History  . Not on file.   Social History Main Topics  . Smoking status: Current Some Day Smoker    Packs/day: 0.25    Types: Cigarettes  . Smokeless tobacco: Never Used  . Alcohol use No  . Drug use: No  . Sexual activity: Not on file   Other Topics Concern  . Not on file   Social History Narrative  . No narrative on file     Review of Systems: General: negative for chills, fever, night sweats or weight changes.  Cardiovascular: negative for chest pain, dyspnea on exertion, edema, orthopnea, palpitations, paroxysmal nocturnal dyspnea or shortness of breath Dermatological: negative for  rash Respiratory: negative for cough or wheezing Urologic: negative for hematuria Abdominal: negative for nausea, vomiting, diarrhea, bright red blood per rectum, melena, or hematemesis Neurologic: negative for visual changes, syncope, or dizziness All other systems reviewed and are otherwise negative except as noted above.   Physical Exam:  Blood pressure 116/76, pulse 74, height 5\' 6"  (1.676 m), weight 134 lb 12.8 oz (61.1 kg), SpO2 97 %.  General appearance: alert, cooperative and no distress Neck: no carotid bruit and no JVD Lungs: clear to auscultation bilaterally Heart: regular rate and rhythm and 1/6 MR murmur Extremities: extremities normal, atraumatic, no cyanosis or edema Pulses: 2+ and  symmetric Skin: Skin color, texture, turgor normal. No rashes or lesions Neurologic: Grossly normal  EKG not performed  -- personally reviewed   ASSESSMENT AND PLAN:   1. Chronic Diastolic HF: euvolemic on exam today. LEE resolved after increase in Lasix to 40 mg daily. We will check f/u BMP today. Pt is concerned about renal function. If increased SCr, we will have her reduce her maintenance dose back down to 20 mg daily and to adjust back to 40 mg when weight is 3 + lb above dry weight. Low sodium diet. BP is controlled. HR in the 70s, but I don't think BP will allow initiation of a BB at this time. She also appears to have some venous insuffiencey, w/ varicose veins. I encouraged use of compression stockings.  2. Mitral Regurgitation: progression from mild to moderate by recent echo. LVEF preserved at 60-65%. No CP or exertional dyspnea. Continue to monitor. May need f/u 2D echo for surveillance in 6-12 months.    Follow-Up: keep yearly f/u with Dr. Radford Pax in 6 months.   Brittainy Ladoris Gene, MHS [facility] 05/21/2016 4:20 PM

## 2016-05-22 LAB — BASIC METABOLIC PANEL
BUN/Creatinine Ratio: 23 (ref 12–28)
BUN: 40 mg/dL — ABNORMAL HIGH (ref 8–27)
CHLORIDE: 97 mmol/L (ref 96–106)
CO2: 26 mmol/L (ref 18–29)
Calcium: 9.8 mg/dL (ref 8.7–10.3)
Creatinine, Ser: 1.75 mg/dL — ABNORMAL HIGH (ref 0.57–1.00)
GFR calc non Af Amer: 27 mL/min/{1.73_m2} — ABNORMAL LOW (ref >59)
GFR, EST AFRICAN AMERICAN: 31 mL/min/{1.73_m2} — AB (ref >59)
Glucose: 91 mg/dL (ref 65–99)
POTASSIUM: 3.8 mmol/L (ref 3.5–5.2)
SODIUM: 142 mmol/L (ref 134–144)

## 2016-05-26 ENCOUNTER — Telehealth: Payer: Self-pay | Admitting: Cardiology

## 2016-05-26 DIAGNOSIS — Z79899 Other long term (current) drug therapy: Secondary | ICD-10-CM

## 2016-05-26 MED ORDER — FUROSEMIDE 40 MG PO TABS
20.0000 mg | ORAL_TABLET | Freq: Every day | ORAL | 6 refills | Status: DC
Start: 2016-05-26 — End: 2016-06-10

## 2016-05-26 NOTE — Telephone Encounter (Signed)
New message     Pt states she missed your call about the lab work , please give her a call back

## 2016-05-26 NOTE — Telephone Encounter (Signed)
-----   Message from [place], [place] sent at 05/22/2016  5:33 PM EDT ----- Slight pump in SCr (measure of kidney function). Reduce Lasix to 20 mg daily. Increase to 40 mg only if > 3lb weigh gain in 24 hrs. Repeat BMP in 2 weeks.

## 2016-05-27 DIAGNOSIS — G894 Chronic pain syndrome: Secondary | ICD-10-CM | POA: Diagnosis not present

## 2016-05-27 DIAGNOSIS — M1990 Unspecified osteoarthritis, unspecified site: Secondary | ICD-10-CM | POA: Diagnosis not present

## 2016-05-27 DIAGNOSIS — M4697 Unspecified inflammatory spondylopathy, lumbosacral region: Secondary | ICD-10-CM | POA: Diagnosis not present

## 2016-05-27 DIAGNOSIS — M549 Dorsalgia, unspecified: Secondary | ICD-10-CM | POA: Diagnosis not present

## 2016-06-09 ENCOUNTER — Other Ambulatory Visit (INDEPENDENT_AMBULATORY_CARE_PROVIDER_SITE_OTHER): Payer: Medicare HMO

## 2016-06-09 DIAGNOSIS — Z79899 Other long term (current) drug therapy: Secondary | ICD-10-CM | POA: Diagnosis not present

## 2016-06-10 ENCOUNTER — Telehealth: Payer: Self-pay | Admitting: *Deleted

## 2016-06-10 LAB — BASIC METABOLIC PANEL
BUN/Creatinine Ratio: 18 (ref 12–28)
BUN: 46 mg/dL — ABNORMAL HIGH (ref 8–27)
CO2: 26 mmol/L (ref 18–29)
Calcium: 9.3 mg/dL (ref 8.7–10.3)
Chloride: 105 mmol/L (ref 96–106)
Creatinine, Ser: 2.51 mg/dL — ABNORMAL HIGH (ref 0.57–1.00)
GFR calc Af Amer: 20 mL/min/{1.73_m2} — ABNORMAL LOW (ref >59)
GFR calc non Af Amer: 17 mL/min/{1.73_m2} — ABNORMAL LOW (ref >59)
GLUCOSE: 126 mg/dL — AB (ref 65–99)
POTASSIUM: 5 mmol/L (ref 3.5–5.2)
SODIUM: 143 mmol/L (ref 134–144)

## 2016-06-10 MED ORDER — FUROSEMIDE 40 MG PO TABS: ORAL_TABLET | ORAL | 6 refills | Status: DC

## 2016-06-10 NOTE — Telephone Encounter (Signed)
-----   Message from Consuelo Pandy, [place] sent at 06/10/2016  8:14 AM EDT ----- Verify what dose of Lasix pt has been taking at home. She was instructed to reduce dose down from 20>>40 mg. Scr has further increased. Recommend that she stop daily lasix and only take PRN based on weight gain of > 3 lb in 24 hrs or 5 lb in 1 week. Also hold Losartan which can also affect the kidneys. F/u later this week in HTN clinic for BP check to see if she will need different antihypertensive medications for coverage. Will also need a repeat BMP in 1 week.

## 2016-06-11 NOTE — Progress Notes (Signed)
Cardiology Office Note    Date:  06/12/2016   ID:  Tammie West, DOB [birth year], MRN [id number]  PCP:  [name], MD  Cardiologist:  Dr. Radford Pax  Chief Complaint: BP and CHF follow up  History of Present Illness:   Tammie West is a 83 y.o. female with a h/o HTN, mild nonobstructive ASCAD with Prox RCA to Mid RCA lesion, 45% stenosed and Ost LAD to Prox LAD lesion, 30% stenosed by cath in 2016, normal LVEF by echo in 2015, mild mitral regurgitation, h/o tobacco use and diastolic HF presents for follow up.   Recently treated with higher dose of Lasix for acute on chronic diastolic heart failure. However her creatinine worsened. Now advised to take Lasix only when necessary for weight gain. Hold losartan. Lower extremity Doppler negative for DVT.  Echo 05/19/16 shows normal LV size with EF of 60-65%, normal IV size and systolic function, moderate mitral regurgitation (worsen, previously mild in 2015).   Here today for further evaluation. She has lost 3 pounds since discontinuation of Lasix for the past 3 days. Edema has been stable. She denies any orthopnea, PND, syncope, palpitation, chest pain or shortness of breath. Compliant with low sodium diet. Past Medical History:  Diagnosis Date  . Carpal tunnel syndrome   . Coronary artery disease 11/2014    cath with Prox RCA to Mid RCA lesion, 45% stenosed and Ost LAD to Prox LAD lesion, 30% stenosed byt recent cath and high calcium score by coronary CTA.  . Crohn's disease ([place])   . Depression   . Diabetes mellitus without complication ([place])   . GERD (gastroesophageal reflux disease)   . Goiter   . Hyperlipidemia   . Hypertension   . Hypothyroidism   . Insomnia   . Migraine   . Osteoarthritis   . Osteoporosis   . Renal failure   . Trochanteric bursitis     Past Surgical History:  Procedure Laterality Date  . ABDOMINAL HYSTERECTOMY    . APPENDECTOMY    . CARDIAC CATHETERIZATION N/A 12/13/2014   Procedure: Left Heart Cath and Coronary Angiography;  Surgeon: Belva Crome, MD;  Location: [facility];  Service: Cardiovascular;  Laterality: N/A;    Current Medications:  Prior to Admission medications   Medication Sig Start Date End Date Taking? Authorizing Provider  amitriptyline (ELAVIL) 25 MG tablet Take 25 mg by mouth at bedtime.   Yes [provider]  aspirin EC 81 MG tablet Take 1 tablet (81 mg total) by mouth daily. 01/10/15  Yes Turner, Eber Hong, MD  insulin glargine (LANTUS) 100 unit/mL SOPN Inject 6 Units into the skin daily after breakfast.    Yes [provider]  levothyroxine (SYNTHROID, LEVOTHROID) 75 MCG tablet Take 75 mcg by mouth daily before breakfast.  05/15/16  Yes [provider]  mesalamine (LIALDA) 1.2 G EC tablet Take 2.4 g by mouth daily after breakfast.   Yes [provider]  Morphine-Naltrexone (EMBEDA) 30-1.2 MG CPCR Take 1 capsule by mouth daily.    Yes [provider]  nitroGLYCERIN (NITROSTAT) 0.4 MG SL tablet Place 1 tablet (0.4 mg total) under the tongue every 5 (five) minutes as needed for chest pain. 01/07/16  Yes Turner, Eber Hong, MD  polyethylene glycol (MIRALAX / GLYCOLAX) packet Take 17 g by mouth daily as needed for mild constipation.   Yes [provider]  Probiotic Product (ALIGN PO) Take 1 capsule by mouth daily after breakfast.   Yes  [provider]  simvastatin (ZOCOR) 10 MG tablet Take 10 mg by mouth at bedtime.  02/09/13  Yes [provider]  sitaGLIPtin (JANUVIA) 50 MG tablet Take 50 mg by mouth daily after breakfast.    Yes [provider]    Allergies:   Atorvastatin; Byetta 10 mcg pen [exenatide]; Cefaclor; Codeine; Crestor [rosuvastatin]; Dexilant [dexlansoprazole]; Erythromycin; Fosamax [alendronate sodium]; Macrobid [nitrofurantoin monohyd macro]; Metformin and related; Nsaids; Penicillins; and Shellfish allergy   Social History   Social History  .  Marital status: Married    Spouse name: N/A  . Number of children: N/A  . Years of education: N/A   Social History Main Topics  . Smoking status: Current Some Day Smoker    Packs/day: 0.25    Types: Cigarettes  . Smokeless tobacco: Never Used  . Alcohol use No  . Drug use: No  . Sexual activity: Not Asked   Other Topics Concern  . None   Social History Narrative  . None     Family History:  The patient's family history includes CAD in her brother; Emphysema in her father; Heart disease in her brother.   ROS:   Please see the history of present illness.    ROS All other systems reviewed and are negative.   PHYSICAL EXAM:   VS:  BP (!) 140/48   Pulse 76   Ht 5\' 6"  (1.676 m)   Wt 130 lb (59 kg)   BMI 20.98 kg/m    GEN: Well nourished, well developed, in no acute distress  HEENT: normal  Neck: no JVD, carotid bruits, or masses Cardiac: RRR; no murmurs, rubs, or gallop. Trace to 1 +  edema  Respiratory:  clear to auscultation bilaterally, normal work of breathing GI: soft, nontender, nondistended, + BS MS: no deformity or atrophy  Skin: warm and dry, no rash Neuro:  Alert and Oriented x 3, Strength and sensation are intact Psych: euthymic mood, full affect  Wt Readings from Last 3 Encounters:  06/12/16 130 lb (59 kg)  05/21/16 134 lb 12.8 oz (61.1 kg)  05/05/16 144 lb 12.8 oz (65.7 kg)      Studies/Labs Reviewed:   EKG:  EKG is ordered today.  The ekg ordered today demonstrates NSR with RBBB  Recent Labs: 01/08/2016: ALT 10 05/05/2016: Brain Natriuretic Peptide 179.1 06/09/2016: BUN 46; Creatinine, Ser 2.51; Potassium 5.0; Sodium 143   Lipid Panel    Component Value Date/Time   CHOL 105 01/08/2016 0941   TRIG 91 01/08/2016 0941   HDL 53 01/08/2016 0941   CHOLHDL 2.0 01/08/2016 0941   VLDL 18 01/08/2016 0941   LDLCALC 34 01/08/2016 0941    Additional studies/ records that were reviewed today include:   Echocardiogram: 05/19/16 Study Conclusions  -  Left ventricle: The cavity size was normal. Wall thickness was   normal. Systolic function was normal. The estimated ejection   fraction was in the range of 60% to 65%. Wall motion was normal;   there were no regional wall motion abnormalities. - Aortic valve: There was no stenosis. - Mitral valve: There was moderate regurgitation. - Right ventricle: The cavity size was normal. Systolic function   was normal. - Tricuspid valve: Peak RV-RA gradient (S): 25 mm Hg. - Pulmonary arteries: PA peak pressure: 28 mm Hg (S). - Inferior vena cava: The vessel was normal in size. The   respirophasic diameter changes were in the normal range (>= 50%),   consistent with normal central venous pressure.  Impressions:  - Normal LV size with EF 60-65%. Normal RV size and systolic   function. Moderate mitral regurgitation.  Left Heart Cath and Coronary Angiography   11/2014  Conclusion   1. Prox RCA to Mid RCA lesion, 45% stenosed. 2. Ost LAD to Prox LAD lesion, 30% stenosed.    Minimal coronary atherosclerosis involving the proximal RCA and mid and proximal LAD. No significant obstructive disease is noted.  Normal left ventricular function.  Coronary calcification and high calcium score.    Recommendations:   No further ischemic evaluation for chest discomfort. Consider other etiology including gastroesophageal.   Plan discharge home later today.     ASSESSMENT & PLAN:    1. Acute on chronic diastolic heart failure/Bilateral lower extremity edema - Now Lasix has stopped due to worsening renal function. Advised to take when necessary based on weight gain of > 3 lb in 24 hrs or 5 lb in 1 week.  - She has lost 3 pounds with stable edema after discontinuation of Lasix for the past 3 days. We'll continue to hold Lasix and losartan. Check BMET today. If worsening renal function she will need referral to a nephrologist. Hopefully her serum creatinine is improving. She is compliant with low  sodium diet. Advised to elevate leg and try compression stocking.   2. HTN - Blood pressure of 140/48 today. Advised to keep log. Will not add any hypertensive current given low SBP.    3. Mitral Regurgitation -  progression from mild to moderate by recent echo. LVEF preserved at 60-65%. No CP or exertional dyspnea. Continue to monitor. May need f/u 2D echo for surveillance in 6-12 months.    4. CKD stage III - Baseline Scr ~1.5. Scr was 1.58 one month ago. Last level check 5/14 was 2.51. Hold lasix and Losartan as above.   Medication Adjustments/Labs and Tests Ordered: Current medicines are reviewed at length with the patient today.  Concerns regarding medicines are outlined above.  Medication changes, Labs and Tests ordered today are listed in the Patient Instructions below. Patient Instructions  Medication Instructions:    Your physician recommends that you continue on your current medications as directed. Please refer to the Current Medication list given to you today.   If you need a refill on your cardiac medications before your next appointment, please call your pharmacy.  Labwork: BMET TODAY    Testing/Procedures: NONE ORDERED  TODAY     Follow-Up:  IN 2 WEEKS WITH SIMMONS OR  Jaret Coppedge   Any Other Special Instructions Will Be Listed Below (If Applicable).                                                                                                                                                      Jarrett Soho, PA  06/12/2016 11:43 AM  [facility] [place], [place], [place]  [address] Phone: [phone]; Fax: [phone]

## 2016-06-12 ENCOUNTER — Encounter: Payer: Self-pay | Admitting: Physician Assistant

## 2016-06-12 ENCOUNTER — Ambulatory Visit (INDEPENDENT_AMBULATORY_CARE_PROVIDER_SITE_OTHER): Payer: Medicare HMO | Admitting: Physician Assistant

## 2016-06-12 VITALS — BP 140/48 | HR 76 | Ht 66.0 in | Wt 130.0 lb

## 2016-06-12 DIAGNOSIS — I5033 Acute on chronic diastolic (congestive) heart failure: Secondary | ICD-10-CM

## 2016-06-12 DIAGNOSIS — R600 Localized edema: Secondary | ICD-10-CM | POA: Diagnosis not present

## 2016-06-12 DIAGNOSIS — I10 Essential (primary) hypertension: Secondary | ICD-10-CM | POA: Diagnosis not present

## 2016-06-12 DIAGNOSIS — I4510 Unspecified right bundle-branch block: Secondary | ICD-10-CM | POA: Diagnosis not present

## 2016-06-12 DIAGNOSIS — N183 Chronic kidney disease, stage 3 (moderate): Secondary | ICD-10-CM

## 2016-06-12 DIAGNOSIS — N179 Acute kidney failure, unspecified: Secondary | ICD-10-CM | POA: Diagnosis not present

## 2016-06-12 LAB — BASIC METABOLIC PANEL
BUN/Creatinine Ratio: 20 (ref 12–28)
BUN: 46 mg/dL — AB (ref 8–27)
CALCIUM: 10 mg/dL (ref 8.7–10.3)
CHLORIDE: 106 mmol/L (ref 96–106)
CO2: 29 mmol/L (ref 18–29)
Creatinine, Ser: 2.35 mg/dL — ABNORMAL HIGH (ref 0.57–1.00)
GFR calc non Af Amer: 19 mL/min/{1.73_m2} — ABNORMAL LOW (ref >59)
GFR, EST AFRICAN AMERICAN: 22 mL/min/{1.73_m2} — AB (ref >59)
GLUCOSE: 142 mg/dL — AB (ref 65–99)
POTASSIUM: 4.8 mmol/L (ref 3.5–5.2)
Sodium: 141 mmol/L (ref 134–144)

## 2016-06-12 NOTE — Patient Instructions (Addendum)
Medication Instructions:    Your physician recommends that you continue on your current medications as directed. Please refer to the Current Medication list given to you today.   If you need a refill on your cardiac medications before your next appointment, please call your pharmacy.  Labwork: BMET TODAY    Testing/Procedures: NONE ORDERED  TODAY     Follow-Up:  IN 2 WEEKS WITH SIMMONS OR  BHAGAT   Any Other Special Instructions Will Be Listed Below (If Applicable).

## 2016-06-13 ENCOUNTER — Telehealth: Payer: Self-pay | Admitting: Physician Assistant

## 2016-06-13 NOTE — Telephone Encounter (Signed)
Follow Up: ° ° ° °Returning your call, concerning her lab results. °

## 2016-06-17 ENCOUNTER — Ambulatory Visit: Payer: Medicare HMO

## 2016-06-17 DIAGNOSIS — M810 Age-related osteoporosis without current pathological fracture: Secondary | ICD-10-CM | POA: Diagnosis not present

## 2016-06-17 DIAGNOSIS — I10 Essential (primary) hypertension: Secondary | ICD-10-CM | POA: Diagnosis not present

## 2016-06-17 DIAGNOSIS — Z0000 Encounter for general adult medical examination without abnormal findings: Secondary | ICD-10-CM | POA: Diagnosis not present

## 2016-06-17 DIAGNOSIS — G894 Chronic pain syndrome: Secondary | ICD-10-CM | POA: Diagnosis not present

## 2016-06-17 DIAGNOSIS — Z1389 Encounter for screening for other disorder: Secondary | ICD-10-CM | POA: Diagnosis not present

## 2016-06-17 DIAGNOSIS — E559 Vitamin D deficiency, unspecified: Secondary | ICD-10-CM | POA: Diagnosis not present

## 2016-06-17 DIAGNOSIS — B373 Candidiasis of vulva and vagina: Secondary | ICD-10-CM | POA: Diagnosis not present

## 2016-06-17 DIAGNOSIS — Z720 Tobacco use: Secondary | ICD-10-CM | POA: Diagnosis not present

## 2016-06-17 DIAGNOSIS — I509 Heart failure, unspecified: Secondary | ICD-10-CM | POA: Diagnosis not present

## 2016-06-19 DIAGNOSIS — I10 Essential (primary) hypertension: Secondary | ICD-10-CM | POA: Diagnosis not present

## 2016-06-19 DIAGNOSIS — E119 Type 2 diabetes mellitus without complications: Secondary | ICD-10-CM | POA: Diagnosis not present

## 2016-06-19 DIAGNOSIS — D649 Anemia, unspecified: Secondary | ICD-10-CM | POA: Diagnosis not present

## 2016-06-19 DIAGNOSIS — I509 Heart failure, unspecified: Secondary | ICD-10-CM | POA: Diagnosis not present

## 2016-06-19 DIAGNOSIS — M810 Age-related osteoporosis without current pathological fracture: Secondary | ICD-10-CM | POA: Diagnosis not present

## 2016-06-19 DIAGNOSIS — E559 Vitamin D deficiency, unspecified: Secondary | ICD-10-CM | POA: Diagnosis not present

## 2016-06-19 DIAGNOSIS — G894 Chronic pain syndrome: Secondary | ICD-10-CM | POA: Diagnosis not present

## 2016-06-24 DIAGNOSIS — M549 Dorsalgia, unspecified: Secondary | ICD-10-CM | POA: Diagnosis not present

## 2016-06-24 DIAGNOSIS — G894 Chronic pain syndrome: Secondary | ICD-10-CM | POA: Diagnosis not present

## 2016-06-24 DIAGNOSIS — M4697 Unspecified inflammatory spondylopathy, lumbosacral region: Secondary | ICD-10-CM | POA: Diagnosis not present

## 2016-06-24 DIAGNOSIS — M1990 Unspecified osteoarthritis, unspecified site: Secondary | ICD-10-CM | POA: Diagnosis not present

## 2016-06-25 NOTE — Progress Notes (Signed)
Cardiology Office Note    Date:  06/26/2016   ID:  Tammie West, DOB 30-Oct-1932, MRN [id number]  PCP:  Tammie Cha, MD  Cardiologist:  Dr. Radford West  Chief Complaint: 2 weeks CHF and HTN follow up  History of Present Illness:   Tammie West is a 83 y.o. female with a h/o HTN, mild nonobstructive ASCAD with Prox RCA to Mid RCA lesion, 45% stenosed and Ost LAD to Prox LAD lesion, 30% stenosed by cath in 2016, normal LVEF by echo in 2015, mild mitral regurgitation, h/o tobacco use and diastolic HF presents for follow up.   Recently treated with higher dose of Lasix for acute on chronic diastolic heart failure. However her creatinine worsened. Now on PRN Lasix for weight gain. Hold losartan. Lower extremity Doppler negative for DVT. Seen by me 06/12/16. At that time, she lost 3 lb after discontinuation of Lasix for the past 3 days. Followed by lab showed improved renal function.   Echo 05/19/16 shows normal LV size with EF of 60-65%, normal IV size and systolic function, moderate mitral regurgitation (worsen, previously mild in 2015).   Here today for follow up. She continues to have lower extremity edema despite wearing compression stocking, low-sodium diet and elevation of the leg. She denies any shortness of breath, chest pain, orthopnea, PND or syncope. Her weight has been related is stable between 130-131lb.   Past Medical History:  Diagnosis Date  . Carpal tunnel syndrome   . Coronary artery disease 11/2014    cath with Prox RCA to Mid RCA lesion, 45% stenosed and Ost LAD to Prox LAD lesion, 30% stenosed byt recent cath and high calcium score by coronary CTA.  . Crohn's disease ([place])   . Depression   . Diabetes mellitus without complication ([place])   . GERD (gastroesophageal reflux disease)   . Goiter   . Hyperlipidemia   . Hypertension   . Hypothyroidism   . Insomnia   . Migraine   . Osteoarthritis   . Osteoporosis   . Renal failure   .  Trochanteric bursitis     Past Surgical History:  Procedure Laterality Date  . ABDOMINAL HYSTERECTOMY    . APPENDECTOMY    . CARDIAC CATHETERIZATION N/A 12/13/2014   Procedure: Left Heart Cath and Coronary Angiography;  Surgeon: Belva Crome, MD;  Location: [facility];  Service: Cardiovascular;  Laterality: N/A;    Current Medications: Prior to Admission medications   Medication Sig Start Date End Date Taking? Authorizing Provider  amitriptyline (ELAVIL) 25 MG tablet Take 25 mg by mouth at bedtime.    [provider]  aspirin EC 81 MG tablet Take 1 tablet (81 mg total) by mouth daily. 01/10/15   Tammie Margarita, MD  insulin glargine (LANTUS) 100 unit/mL SOPN Inject 6 Units into the skin daily after breakfast.     [provider]  levothyroxine (SYNTHROID, LEVOTHROID) 75 MCG tablet Take 75 mcg by mouth daily before breakfast.  05/15/16   [provider]  mesalamine (LIALDA) 1.2 G EC tablet Take 2.4 g by mouth daily after breakfast.    [provider]  Morphine-Naltrexone (EMBEDA) 30-1.2 MG CPCR Take 1 capsule by mouth daily.     [provider]  nitroGLYCERIN (NITROSTAT) 0.4 MG SL tablet Place 1 tablet (0.4 mg total) under the tongue every 5 (five) minutes as needed for chest pain. 01/07/16   Tammie Margarita, MD  polyethylene glycol (MIRALAX / GLYCOLAX) packet Take 17  g by mouth daily as needed for mild constipation.    [provider]  Probiotic Product (ALIGN PO) Take 1 capsule by mouth daily after breakfast.    [provider]  simvastatin (ZOCOR) 10 MG tablet Take 10 mg by mouth at bedtime.  02/09/13   [provider]  sitaGLIPtin (JANUVIA) 50 MG tablet Take 50 mg by mouth daily after breakfast.     [provider]    Allergies:   Atorvastatin; Byetta 10 mcg pen [exenatide]; Cefaclor; Codeine; Crestor [rosuvastatin]; Dexilant [dexlansoprazole]; Erythromycin; Fosamax [alendronate sodium]; Macrobid  [nitrofurantoin monohyd macro]; Metformin and related; Nsaids; Penicillins; and Shellfish allergy   Social History   Social History  . Marital status: Married    Spouse name: N/A  . Number of children: N/A  . Years of education: N/A   Social History Main Topics  . Smoking status: Current Some Day Smoker    Packs/day: 0.25    Types: Cigarettes  . Smokeless tobacco: Never Used  . Alcohol use No  . Drug use: No  . Sexual activity: Not Asked   Other Topics Concern  . None   Social History Narrative  . None     Family History:  The patient's family history includes CAD in her brother; Emphysema in her father; Heart disease in her brother.   ROS:   Please see the history of present illness.    ROS All other systems reviewed and are negative.   PHYSICAL EXAM:   VS:  BP (!) 150/64 (BP Location: Left Arm)   Pulse 72   Ht 5\' 6"  (1.676 m)   Wt 131 lb 1.9 oz (59.5 kg)   BMI 21.16 kg/m    GEN: Well nourished, well developed, in no acute distress  HEENT: normal  Neck: no JVD, carotid bruits, or masses Cardiac: RRR; holo systolic murmurs, rubs, or gallops, Trace to 1+ BL LE edema  Respiratory:  clear to auscultation bilaterally, normal work of breathing GI: soft, nontender, nondistended, + BS MS: no deformity or atrophy  Skin: warm and dry, no rash Neuro:  Alert and Oriented x 3, Strength and sensation are intact Psych: euthymic mood, full affect  Wt Readings from Last 3 Encounters:  06/26/16 131 lb 1.9 oz (59.5 kg)  06/12/16 130 lb (59 kg)  05/21/16 134 lb 12.8 oz (61.1 kg)      Studies/Labs Reviewed:   EKG:  EKG is not ordered today.    Recent Labs: 01/08/2016: ALT 10 05/05/2016: Brain Natriuretic Peptide 179.1 06/12/2016: BUN 46; Creatinine, Ser 2.35; Potassium 4.8; Sodium 141   Lipid Panel    Component Value Date/Time   CHOL 105 01/08/2016 0941   TRIG 91 01/08/2016 0941   HDL 53 01/08/2016 0941   CHOLHDL 2.0 01/08/2016 0941   VLDL 18 01/08/2016 0941    LDLCALC 34 01/08/2016 0941    Additional studies/ records that were reviewed today include:  Echocardiogram: 05/19/16 Study Conclusions  - Left ventricle: The cavity size was normal. Wall thickness was normal. Systolic function was normal. The estimated ejection fraction was in the range of 60% to 65%. Wall motion was normal; there were no regional wall motion abnormalities. - Aortic valve: There was no stenosis. - Mitral valve: There was moderate regurgitation. - Right ventricle: The cavity size was normal. Systolic function was normal. - Tricuspid valve: Peak RV-RA gradient (S): 25 mm Hg. - Pulmonary arteries: PA peak pressure: 28 mm Hg (S). - Inferior vena cava: The vessel was normal in  size. The respirophasic diameter changes were in the normal range (>= 50%), consistent with normal central venous pressure.  Impressions:  - Normal LV size with EF 60-65%. Normal RV size and systolic function. Moderate mitral regurgitation.  Left Heart Cath and Coronary Angiography   11/2014  Conclusion   1. Prox RCA to Mid RCA lesion, 45% stenosed. 2. Ost LAD to Prox LAD lesion, 30% stenosed.   Minimal coronary atherosclerosis involving the proximal RCA and mid and proximal LAD. No significant obstructive disease is noted.  Normal left ventricular function.  Coronary calcification and high calcium score.    Recommendations:   No further ischemic evaluation for chest discomfort. Consider other etiology including gastroesophageal.   Plan discharge home later today.     ASSESSMENT & PLAN:   1. Acute on chronic diastolic heart failure/Bilateral lower extremity edema - Lasix and losartan on hold. Scr improving. She still have edema. No orthopnea, PND or dyspnea. Her weight has been stable at 130-131lb. ? Her edema due to MR, varicose vein or worsening renal function. Will check BMET today. If improved renal function, will restart low dose lasix. If not, refer to  nephrologist.   2. HTN - Losartan on hold due worsen renal function. Elevated today. Will start coreg.   3. Mitral Regurgitation -  progression from mild to moderate by recent echo. LVEF preserved at 60-65%. No CP or exertional dyspnea. Continue to monitor. May need f/u 2D echo for surveillance in 6-12 months.   4. Acute on CKD stage III-IV - Baseline Scr ~1.5. Improved Scr to 2.35 on 5/17 with holding lasix and losartan. BMET today.    Medication Adjustments/Labs and Tests Ordered: Current medicines are reviewed at length with the patient today.  Concerns regarding medicines are outlined above.  Medication changes, Labs and Tests ordered today are listed in the Patient Instructions below. Patient Instructions  Medication Instructions:  Your physician recommends that you continue on your current medications as directed. Please refer to the Current Medication list given to you today.   Labwork: TODAY:  BMET  Testing/Procedures: None ordered  Follow-Up: Your physician recommends that you schedule a follow-up appointment in: [place]   Any Other Special Instructions Will Be Listed Below (If Applicable).     If you need a refill on your cardiac medications before your next appointment, please call your pharmacy.      Jarrett Soho, [place]  06/26/2016 2:26 PM    [facility] [place], [place], [place]  [address] Phone: [phone]; Fax: [phone]

## 2016-06-26 ENCOUNTER — Encounter: Payer: Self-pay | Admitting: Physician Assistant

## 2016-06-26 ENCOUNTER — Ambulatory Visit (INDEPENDENT_AMBULATORY_CARE_PROVIDER_SITE_OTHER): Payer: Medicare HMO | Admitting: Physician Assistant

## 2016-06-26 VITALS — BP 150/64 | HR 72 | Ht 66.0 in | Wt 131.1 lb

## 2016-06-26 DIAGNOSIS — N179 Acute kidney failure, unspecified: Secondary | ICD-10-CM

## 2016-06-26 DIAGNOSIS — I5033 Acute on chronic diastolic (congestive) heart failure: Secondary | ICD-10-CM | POA: Diagnosis not present

## 2016-06-26 DIAGNOSIS — I340 Nonrheumatic mitral (valve) insufficiency: Secondary | ICD-10-CM

## 2016-06-26 DIAGNOSIS — N183 Chronic kidney disease, stage 3 (moderate): Secondary | ICD-10-CM

## 2016-06-26 DIAGNOSIS — I10 Essential (primary) hypertension: Secondary | ICD-10-CM | POA: Diagnosis not present

## 2016-06-26 DIAGNOSIS — R600 Localized edema: Secondary | ICD-10-CM

## 2016-06-26 NOTE — Addendum Note (Signed)
Addended by: Gaetano Net on: 06/26/2016 03:43 PM   Modules accepted: Orders

## 2016-06-26 NOTE — Patient Instructions (Addendum)
Medication Instructions:  Your physician recommends that you continue on your current medications as directed. Please refer to the Current Medication list given to you today.   Labwork: TODAY:  BMET  Testing/Procedures: None ordered  Follow-Up: Your physician recommends that you schedule a follow-up appointment in: [place]   Any Other Special Instructions Will Be Listed Below (If Applicable).     If you need a refill on your cardiac medications before your next appointment, please call your pharmacy.

## 2016-06-27 ENCOUNTER — Telehealth: Payer: Self-pay

## 2016-06-27 DIAGNOSIS — I10 Essential (primary) hypertension: Secondary | ICD-10-CM

## 2016-06-27 LAB — BASIC METABOLIC PANEL
BUN/Creatinine Ratio: 13 (ref 12–28)
BUN: 17 mg/dL (ref 8–27)
CALCIUM: 9.9 mg/dL (ref 8.7–10.3)
CHLORIDE: 105 mmol/L (ref 96–106)
CO2: 21 mmol/L (ref 18–29)
Creatinine, Ser: 1.32 mg/dL — ABNORMAL HIGH (ref 0.57–1.00)
GFR, EST AFRICAN AMERICAN: 44 mL/min/{1.73_m2} — AB (ref >59)
GFR, EST NON AFRICAN AMERICAN: 38 mL/min/{1.73_m2} — AB (ref >59)
Glucose: 99 mg/dL (ref 65–99)
Potassium: 4.4 mmol/L (ref 3.5–5.2)
Sodium: 143 mmol/L (ref 134–144)

## 2016-06-27 MED ORDER — FUROSEMIDE 20 MG PO TABS: 20.0000 mg | ORAL_TABLET | Freq: Every day | ORAL | 11 refills | Status: DC

## 2016-06-27 NOTE — Telephone Encounter (Signed)
-----   Message from Sueanne Margarita, MD sent at 06/27/2016 10:48 AM EDT ----- Please have patient restart lasix 20mg  daily as her creatinine has improved.  Repeat BMET in 1 week and followup with me or extender in 2 weeks  Fransico Him

## 2016-06-27 NOTE — Telephone Encounter (Signed)
Instructed patient to RESTART LASIX 20 mg daily since creatinine has improved.  BMET scheduled 6/8. Patient will keep already schedule appointment with Dr. Radford Pax 6/22. Patient was grateful for call and agrees with treatment plan.

## 2016-07-02 LAB — SPECIMEN STATUS REPORT

## 2016-07-02 LAB — ALBUMIN: Albumin: 3.8 g/dL (ref 3.5–4.7)

## 2016-07-04 ENCOUNTER — Other Ambulatory Visit: Payer: Medicare HMO | Admitting: *Deleted

## 2016-07-04 DIAGNOSIS — I10 Essential (primary) hypertension: Secondary | ICD-10-CM

## 2016-07-05 LAB — BASIC METABOLIC PANEL
BUN / CREAT RATIO: 16 (ref 12–28)
BUN: 26 mg/dL (ref 8–27)
CHLORIDE: 102 mmol/L (ref 96–106)
CO2: 25 mmol/L (ref 18–29)
Calcium: 9.8 mg/dL (ref 8.7–10.3)
Creatinine, Ser: 1.62 mg/dL — ABNORMAL HIGH (ref 0.57–1.00)
GFR calc non Af Amer: 30 mL/min/{1.73_m2} — ABNORMAL LOW (ref >59)
GFR, EST AFRICAN AMERICAN: 34 mL/min/{1.73_m2} — AB (ref >59)
GLUCOSE: 161 mg/dL — AB (ref 65–99)
POTASSIUM: 4.1 mmol/L (ref 3.5–5.2)
Sodium: 143 mmol/L (ref 134–144)

## 2016-07-07 ENCOUNTER — Telehealth: Payer: Self-pay | Admitting: Cardiology

## 2016-07-07 DIAGNOSIS — R7989 Other specified abnormal findings of blood chemistry: Secondary | ICD-10-CM

## 2016-07-07 NOTE — Telephone Encounter (Signed)
-----   Message from Sueanne Margarita, MD sent at 07/06/2016 10:43 PM EDT ----- Creatinine bumped with addition of Lasix for LE edema. - I suspect that she has significant underlying CKD - please refer her to renal for evaluation.  Cannot adequately treat LE edema due to worsening renal funciotn

## 2016-07-07 NOTE — Telephone Encounter (Signed)
New message   Patient calling [facility] call this am    Pt C/O medication issue:  1. Name of Medication: new medication Proleara unsure of dosage   2. How are you currently taking this medication (dosage and times per day)? Shot given every 6 months   3. Are you having a reaction (difficulty breathing--STAT)? Per patient  new medication    4. What is your medication issue? side effect says kidney disease /  wants permission from Dr. Radford Pax if she can take medication or not.

## 2016-07-07 NOTE — Telephone Encounter (Signed)
Informed patient of lab results and verbal understanding expressed. Renal referral placed for scheduling. Patient agrees with treatment plan.   Patient also wants to know she is fine from a cardiac standpoint to start Proleara injections for osteoporosis.  Informed her [facility] will be consulted but she will need to get the OK from her other doctors as well.

## 2016-07-07 NOTE — Telephone Encounter (Signed)
No contraindications to starting Prolia therapy. However, there is a 5% incidence of edema which pt already has. Prolia can also increase blood pressure (11% incidence) and pt's most recent BP was borderline elevated at 150/64. Would advise pt to monitor for signs of worsening edema or HTN, otherwise ok to start therapy from a cardiac perspective.

## 2016-07-08 DIAGNOSIS — M810 Age-related osteoporosis without current pathological fracture: Secondary | ICD-10-CM | POA: Diagnosis not present

## 2016-07-08 DIAGNOSIS — Z794 Long term (current) use of insulin: Secondary | ICD-10-CM | POA: Diagnosis not present

## 2016-07-08 DIAGNOSIS — R609 Edema, unspecified: Secondary | ICD-10-CM | POA: Diagnosis not present

## 2016-07-08 DIAGNOSIS — Z720 Tobacco use: Secondary | ICD-10-CM | POA: Diagnosis not present

## 2016-07-08 DIAGNOSIS — E039 Hypothyroidism, unspecified: Secondary | ICD-10-CM | POA: Diagnosis not present

## 2016-07-08 DIAGNOSIS — E049 Nontoxic goiter, unspecified: Secondary | ICD-10-CM | POA: Diagnosis not present

## 2016-07-08 DIAGNOSIS — E119 Type 2 diabetes mellitus without complications: Secondary | ICD-10-CM | POA: Diagnosis not present

## 2016-07-08 DIAGNOSIS — R630 Anorexia: Secondary | ICD-10-CM | POA: Diagnosis not present

## 2016-07-10 NOTE — Telephone Encounter (Signed)
Informed patient there are no contraindications to starting Prolia from a cardiac standpoint. Discussed with her to monitor BP and for swelling. Patient wants to be evaluated by Nephrology prior to starting as she was told it could cause kidney damage.  She is scheduled to see Nephrology MD in July.

## 2016-07-18 ENCOUNTER — Ambulatory Visit (INDEPENDENT_AMBULATORY_CARE_PROVIDER_SITE_OTHER): Payer: Medicare HMO | Admitting: Cardiology

## 2016-07-18 ENCOUNTER — Encounter: Payer: Self-pay | Admitting: Cardiology

## 2016-07-18 VITALS — BP 138/60 | HR 77 | Ht 66.0 in | Wt 126.1 lb

## 2016-07-18 DIAGNOSIS — N183 Chronic kidney disease, stage 3 unspecified: Secondary | ICD-10-CM

## 2016-07-18 DIAGNOSIS — I130 Hypertensive heart and chronic kidney disease with heart failure and stage 1 through stage 4 chronic kidney disease, or unspecified chronic kidney disease: Secondary | ICD-10-CM | POA: Diagnosis not present

## 2016-07-18 DIAGNOSIS — I10 Essential (primary) hypertension: Secondary | ICD-10-CM | POA: Diagnosis not present

## 2016-07-18 DIAGNOSIS — I25118 Atherosclerotic heart disease of native coronary artery with other forms of angina pectoris: Secondary | ICD-10-CM | POA: Diagnosis not present

## 2016-07-18 DIAGNOSIS — I5032 Chronic diastolic (congestive) heart failure: Secondary | ICD-10-CM | POA: Insufficient documentation

## 2016-07-18 DIAGNOSIS — I340 Nonrheumatic mitral (valve) insufficiency: Secondary | ICD-10-CM | POA: Diagnosis not present

## 2016-07-18 DIAGNOSIS — E7800 Pure hypercholesterolemia, unspecified: Secondary | ICD-10-CM | POA: Diagnosis not present

## 2016-07-18 NOTE — Progress Notes (Signed)
Cardiology Office Note    Date:  07/23/2016   ID:  Tammie West, DOB 1932/07/22, MRN [id number]  PCP:  Tammie Cha, MD  Cardiologist:  Tammie Him, MD   Chief Complaint  Patient presents with  . Coronary Artery Disease  . Hypertension  . Hyperlipidemia    History of Present Illness:  Tammie West is a 83 y.o. female with a history of HTN, nonobstructive ASCAD with Prox RCA to Mid RCA lesion, 45% stenosed and Ost LAD to Prox LAD lesion, 30% stenosed by cath and high calcium score by coronary CTA. She also has dyslipidemia, chronic diastolic CHF, moderate MR by echo 04/2016 and chronic LE edema and continues with tobacco.  She says that she has a lot of anxiety with her chronic back pain and uses tobacco as a crutch.   She presents today for followup and is doing well.   She denies any chest pain or pressure,PND, orthopnea, SOB, DOE, dizziness, palpitations or syncope. She continues to have LE edema but improved after getting on Lasix again.     Past Medical History:  Diagnosis Date  . Carpal tunnel syndrome   . CKD (chronic kidney disease) stage 4, GFR 15-29 ml/min (HCC)   . Coronary artery disease 11/2014    cath with Prox RCA to Mid RCA lesion, 45% stenosed and Ost LAD to Prox LAD lesion, 30% stenosed byt recent cath and high calcium score by coronary CTA.  . Crohn's disease ([place])   . Depression   . Diabetes mellitus without complication ([place])   . GERD (gastroesophageal reflux disease)   . Goiter   . Hyperlipidemia   . Hypertension   . Hypothyroidism   . Insomnia   . Migraine   . Osteoarthritis   . Osteoporosis   . Trochanteric bursitis     Past Surgical History:  Procedure Laterality Date  . ABDOMINAL HYSTERECTOMY    . APPENDECTOMY    . CARDIAC CATHETERIZATION N/A 12/13/2014   Procedure: Left Heart Cath and Coronary Angiography;  Surgeon: Tammie Crome, MD;  Location: [facility];  Service: Cardiovascular;  Laterality: N/A;     Current Medications: Current Meds  Medication Sig  . amitriptyline (ELAVIL) 25 MG tablet Take 25 mg by mouth at bedtime.  Marland Kitchen aspirin EC 81 MG tablet Take 1 tablet (81 mg total) by mouth daily.  Marland Kitchen escitalopram (LEXAPRO) 10 MG tablet Take 10 mg by mouth daily.  . furosemide (LASIX) 20 MG tablet Take 1 tablet (20 mg total) by mouth daily.  . insulin glargine (LANTUS) 100 unit/mL SOPN Inject 6 Units into the skin daily after breakfast.   . levothyroxine (SYNTHROID, LEVOTHROID) 75 MCG tablet Take 75 mcg by mouth daily before breakfast.   . mesalamine (LIALDA) 1.2 G EC tablet Take 2.4 g by mouth daily after breakfast.  . Morphine-Naltrexone (EMBEDA) 30-1.2 MG CPCR Take 1 capsule by mouth daily.   . nitroGLYCERIN (NITROSTAT) 0.4 MG SL tablet Place 1 tablet (0.4 mg total) under the tongue every 5 (five) minutes as needed for chest pain.  . polyethylene glycol (MIRALAX / GLYCOLAX) packet Take 17 g by mouth daily as needed for mild constipation.  . Probiotic Product (ALIGN PO) Take 1 capsule by mouth daily after breakfast.  . simvastatin (ZOCOR) 10 MG tablet Take 10 mg by mouth at bedtime.     Allergies:   Atorvastatin; Byetta 10 mcg pen [exenatide]; Cefaclor; Codeine; Crestor [rosuvastatin]; Dexilant [dexlansoprazole]; Erythromycin; Fosamax [alendronate sodium]; Macrobid [nitrofurantoin monohyd  macro]; Metformin and related; Nsaids; Penicillins; and Shellfish allergy   Social History   Social History  . Marital status: Married    Spouse name: N/A  . Number of children: N/A  . Years of education: N/A   Social History Main Topics  . Smoking status: Current Some Day Smoker    Packs/day: 0.25    Types: Cigarettes  . Smokeless tobacco: Never Used  . Alcohol use No  . Drug use: No  . Sexual activity: Not Asked   Other Topics Concern  . None   Social History Narrative  . None     Family History:  The patient's family history includes CAD in her brother; Emphysema in her father; Heart  disease in her brother.   ROS:   Please see the history of present illness.    ROS All other systems reviewed and are negative.  No flowsheet data found.     PHYSICAL EXAM:   VS:  BP 138/60   Pulse 77   Ht 5\' 6"  (1.676 m)   Wt 126 lb 1.9 oz (57.2 kg)   SpO2 94%   BMI 20.36 kg/m    GEN: Well nourished, well developed, in no acute distress  HEENT: normal  Neck: no JVD, carotid bruits, or masses Cardiac: RRR; no murmurs, rubs, or gallops,no edema.  Intact distal pulses bilaterally.  Respiratory:  clear to auscultation bilaterally, normal work of breathing GI: soft, nontender, nondistended, + BS MS: no deformity or atrophy  Skin: warm and dry, no rash Neuro:  Alert and Oriented x 3, Strength and sensation are intact Psych: euthymic mood, full affect  Wt Readings from Last 3 Encounters:  07/18/16 126 lb 1.9 oz (57.2 kg)  06/26/16 131 lb 1.9 oz (59.5 kg)  06/12/16 130 lb (59 kg)      Studies/Labs Reviewed:   EKG:  EKG is ordered today.  The ekg ordered today demonstrates   Recent Labs: 01/08/2016: ALT 10 05/05/2016: Brain Natriuretic Peptide 179.1 07/04/2016: BUN 26; Creatinine, Ser 1.62; Potassium 4.1; Sodium 143   Lipid Panel    Component Value Date/Time   CHOL 105 01/08/2016 0941   TRIG 91 01/08/2016 0941   HDL 53 01/08/2016 0941   CHOLHDL 2.0 01/08/2016 0941   VLDL 18 01/08/2016 0941   LDLCALC 34 01/08/2016 0941    Additional studies/ records that were reviewed today include:  none    ASSESSMENT:    1. Chronic diastolic (congestive) heart failure ([place])   2. Essential hypertension   3. Non-rheumatic mitral regurgitation   4. 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 Coronary artery disease of native artery of native heart with stable angina pectoris ([place])   6. Pure hypercholesterolemia      PLAN:  In order of problems listed above:  1. Chronic diastolic heart failure/Bilateral lower extremity  edema - She still has edema but overall I do not think she is grossly volume overloaded.   No orthopnea, PND or dyspnea. Her weight has been stable and actually she has lost 5lbs since her last OV.  - likely her edema is due to varicose veins and sedentary state with her sitting all day and keeping her legs hanging down.  I have giver her a Rx for compression hose and encouraged her to use them during the day. Her creatinine bumped to 1.62 with readdition of low dose lasix and she has been referred to nephrology.  -  continue current dose of Lasix for now.  2. HTN - Bp adequately controlled on exam today.   - Losartan stopped due worsening renal function.   3. Mitral Regurgitation - progression from mild to moderate by recent echo 04/2016. LVEF preserved at 60-65%. No CP or exertional dyspnea. I will repeat echo to make sure MR has not worsened as edema continues.  4. CKD stage III-IV - Baseline Scr ~1.5.SCR increased to 2.51 on Lasix  but improved to 1.32  after holding  lasix and losartan. Repeat BMET after restarting low dose Lasix was 1.62. She has been referred to nephrology.  5.  Nonobstructive ASCAD - cath with Prox RCA to Mid RCA lesion, 45% stenosed and Ost LAD to Prox LAD lesion, 30% stenosed by cath and high calcium score by coronary CTA with no anginal symptoms.  She will continue on ASA 81mg  daily and statin.   6.  Hyperlipidemia with LDL goal < 70.  She will continue on simvastatin 10mg  daily.  LDL was 34 in Dec 2017.  I will repeat an FLP and ALT.    Medication Adjustments/Labs and Tests Ordered: Current medicines are reviewed at length with the patient today.  Concerns regarding medicines are outlined above.  Medication changes, Labs and Tests ordered today are listed in the Patient Instructions below.  Patient Instructions  Medication Instructions:  Your physician recommends that you continue on your current medications as directed. Please refer to the Current  Medication list given to you today.   Labwork: none  Testing/Procedures: Your physician has requested that you have a lower extremity arterial doppler- During this test, ultrasound is used to evaluate arterial blood flow in the legs. Allow approximately one hour for this exam.   Your physician has requested that you have an echocardiogram. Echocardiography is a painless test that uses sound waves to create images of your heart. It provides your doctor with information about the size and shape of your heart and how well your heart's chambers and valves are working. This procedure takes approximately one hour. There are no restrictions for this procedure. April 2019   Follow-Up: Your physician wants you to follow-up in: 6 months with Dr. Radford Pax. You will receive a reminder letter in the mail two months in advance. If you don't receive a letter, please call our office to schedule the follow-up appointment.   Any Other Special Instructions Will Be Listed Below (If Applicable).   REMINDER: Keep feet elevated when sitting to help with swelling in feet and ankles.  If you need a refill on your cardiac medications before your next appointment, please call your pharmacy.      Signed, Tammie Him, MD  07/23/2016 6:08 PM    [facility] [address], [place], [place]  [address] Phone: [phone]; Fax: [phone]

## 2016-07-18 NOTE — Patient Instructions (Signed)
Medication Instructions:  Your physician recommends that you continue on your current medications as directed. Please refer to the Current Medication list given to you today.   Labwork: none  Testing/Procedures: Your physician has requested that you have a lower extremity arterial doppler- During this test, ultrasound is used to evaluate arterial blood flow in the legs. Allow approximately one hour for this exam.   Your physician has requested that you have an echocardiogram. Echocardiography is a painless test that uses sound waves to create images of your heart. It provides your doctor with information about the size and shape of your heart and how well your heart's chambers and valves are working. This procedure takes approximately one hour. There are no restrictions for this procedure. April 2019   Follow-Up: Your physician wants you to follow-up in: 6 months with Dr. Radford Pax. You will receive a reminder letter in the mail two months in advance. If you don't receive a letter, please call our office to schedule the follow-up appointment.   Any Other Special Instructions Will Be Listed Below (If Applicable).   REMINDER: Keep feet elevated when sitting to help with swelling in feet and ankles.  If you need a refill on your cardiac medications before your next appointment, please call your pharmacy.

## 2016-07-22 DIAGNOSIS — M1990 Unspecified osteoarthritis, unspecified site: Secondary | ICD-10-CM | POA: Diagnosis not present

## 2016-07-22 DIAGNOSIS — M4697 Unspecified inflammatory spondylopathy, lumbosacral region: Secondary | ICD-10-CM | POA: Diagnosis not present

## 2016-07-22 DIAGNOSIS — G894 Chronic pain syndrome: Secondary | ICD-10-CM | POA: Diagnosis not present

## 2016-07-22 DIAGNOSIS — M549 Dorsalgia, unspecified: Secondary | ICD-10-CM | POA: Diagnosis not present

## 2016-07-23 ENCOUNTER — Encounter: Payer: Self-pay | Admitting: Cardiology

## 2016-08-04 ENCOUNTER — Other Ambulatory Visit: Payer: Self-pay | Admitting: Cardiology

## 2016-08-04 DIAGNOSIS — I739 Peripheral vascular disease, unspecified: Secondary | ICD-10-CM

## 2016-08-04 DIAGNOSIS — I340 Nonrheumatic mitral (valve) insufficiency: Secondary | ICD-10-CM

## 2016-08-12 ENCOUNTER — Ambulatory Visit (HOSPITAL_COMMUNITY)
Admission: RE | Admit: 2016-08-12 | Discharge: 2016-08-12 | Disposition: A | Discharge status: Home / Self Care | Payer: Medicare HMO | Source: Ambulatory Visit | Attending: Cardiovascular Disease | Admitting: Cardiovascular Disease

## 2016-08-12 DIAGNOSIS — R938 Abnormal findings on diagnostic imaging of other specified body structures: Secondary | ICD-10-CM | POA: Insufficient documentation

## 2016-08-12 DIAGNOSIS — I2510 Atherosclerotic heart disease of native coronary artery without angina pectoris: Secondary | ICD-10-CM | POA: Insufficient documentation

## 2016-08-12 DIAGNOSIS — E785 Hyperlipidemia, unspecified: Secondary | ICD-10-CM | POA: Diagnosis not present

## 2016-08-12 DIAGNOSIS — I10 Essential (primary) hypertension: Secondary | ICD-10-CM | POA: Insufficient documentation

## 2016-08-12 DIAGNOSIS — Z720 Tobacco use: Secondary | ICD-10-CM | POA: Diagnosis not present

## 2016-08-12 DIAGNOSIS — M79606 Pain in leg, unspecified: Secondary | ICD-10-CM | POA: Diagnosis not present

## 2016-08-12 DIAGNOSIS — E1151 Type 2 diabetes mellitus with diabetic peripheral angiopathy without gangrene: Secondary | ICD-10-CM | POA: Insufficient documentation

## 2016-08-12 DIAGNOSIS — I739 Peripheral vascular disease, unspecified: Secondary | ICD-10-CM

## 2016-08-19 DIAGNOSIS — Z720 Tobacco use: Secondary | ICD-10-CM | POA: Diagnosis not present

## 2016-08-19 DIAGNOSIS — N183 Chronic kidney disease, stage 3 (moderate): Secondary | ICD-10-CM | POA: Diagnosis not present

## 2016-08-19 DIAGNOSIS — M549 Dorsalgia, unspecified: Secondary | ICD-10-CM | POA: Diagnosis not present

## 2016-08-19 DIAGNOSIS — R7989 Other specified abnormal findings of blood chemistry: Secondary | ICD-10-CM | POA: Diagnosis not present

## 2016-08-19 DIAGNOSIS — G894 Chronic pain syndrome: Secondary | ICD-10-CM | POA: Diagnosis not present

## 2016-08-19 DIAGNOSIS — M1990 Unspecified osteoarthritis, unspecified site: Secondary | ICD-10-CM | POA: Diagnosis not present

## 2016-08-19 DIAGNOSIS — M4697 Unspecified inflammatory spondylopathy, lumbosacral region: Secondary | ICD-10-CM | POA: Diagnosis not present

## 2016-08-20 ENCOUNTER — Other Ambulatory Visit: Payer: Self-pay | Admitting: Nephrology

## 2016-08-20 DIAGNOSIS — N183 Chronic kidney disease, stage 3 unspecified: Secondary | ICD-10-CM

## 2016-08-22 ENCOUNTER — Ambulatory Visit
Admission: RE | Admit: 2016-08-22 | Discharge: 2016-08-22 | Disposition: A | Discharge status: Home / Self Care | Payer: Medicare HMO | Source: Ambulatory Visit | Attending: Nephrology | Admitting: Nephrology

## 2016-08-22 DIAGNOSIS — N183 Chronic kidney disease, stage 3 unspecified: Secondary | ICD-10-CM

## 2016-09-11 DIAGNOSIS — N183 Chronic kidney disease, stage 3 (moderate): Secondary | ICD-10-CM | POA: Diagnosis not present

## 2016-09-11 DIAGNOSIS — I129 Hypertensive chronic kidney disease with stage 1 through stage 4 chronic kidney disease, or unspecified chronic kidney disease: Secondary | ICD-10-CM | POA: Diagnosis not present

## 2016-09-11 DIAGNOSIS — Z720 Tobacco use: Secondary | ICD-10-CM | POA: Diagnosis not present

## 2016-09-16 DIAGNOSIS — G894 Chronic pain syndrome: Secondary | ICD-10-CM | POA: Diagnosis not present

## 2016-09-16 DIAGNOSIS — M4697 Unspecified inflammatory spondylopathy, lumbosacral region: Secondary | ICD-10-CM | POA: Diagnosis not present

## 2016-09-16 DIAGNOSIS — Z79891 Long term (current) use of opiate analgesic: Secondary | ICD-10-CM | POA: Diagnosis not present

## 2016-09-16 DIAGNOSIS — M549 Dorsalgia, unspecified: Secondary | ICD-10-CM | POA: Diagnosis not present

## 2016-09-16 DIAGNOSIS — Z79899 Other long term (current) drug therapy: Secondary | ICD-10-CM | POA: Diagnosis not present

## 2016-09-16 DIAGNOSIS — M1990 Unspecified osteoarthritis, unspecified site: Secondary | ICD-10-CM | POA: Diagnosis not present

## 2016-09-30 DIAGNOSIS — R609 Edema, unspecified: Secondary | ICD-10-CM | POA: Diagnosis not present

## 2016-09-30 DIAGNOSIS — Z794 Long term (current) use of insulin: Secondary | ICD-10-CM | POA: Diagnosis not present

## 2016-09-30 DIAGNOSIS — E049 Nontoxic goiter, unspecified: Secondary | ICD-10-CM | POA: Diagnosis not present

## 2016-09-30 DIAGNOSIS — E039 Hypothyroidism, unspecified: Secondary | ICD-10-CM | POA: Diagnosis not present

## 2016-09-30 DIAGNOSIS — E119 Type 2 diabetes mellitus without complications: Secondary | ICD-10-CM | POA: Diagnosis not present

## 2016-09-30 DIAGNOSIS — M810 Age-related osteoporosis without current pathological fracture: Secondary | ICD-10-CM | POA: Diagnosis not present

## 2016-11-04 DIAGNOSIS — M1990 Unspecified osteoarthritis, unspecified site: Secondary | ICD-10-CM | POA: Diagnosis not present

## 2016-11-04 DIAGNOSIS — G894 Chronic pain syndrome: Secondary | ICD-10-CM | POA: Diagnosis not present

## 2016-11-04 DIAGNOSIS — M4697 Unspecified inflammatory spondylopathy, lumbosacral region: Secondary | ICD-10-CM | POA: Diagnosis not present

## 2016-11-04 DIAGNOSIS — M549 Dorsalgia, unspecified: Secondary | ICD-10-CM | POA: Diagnosis not present

## 2016-12-10 DIAGNOSIS — R69 Illness, unspecified: Secondary | ICD-10-CM | POA: Diagnosis not present

## 2016-12-11 ENCOUNTER — Other Ambulatory Visit: Payer: Self-pay | Admitting: Podiatry

## 2016-12-11 ENCOUNTER — Encounter: Payer: Self-pay | Admitting: Podiatry

## 2016-12-11 ENCOUNTER — Ambulatory Visit: Payer: Medicare HMO | Admitting: Podiatry

## 2016-12-11 ENCOUNTER — Ambulatory Visit (INDEPENDENT_AMBULATORY_CARE_PROVIDER_SITE_OTHER): Payer: Medicare HMO

## 2016-12-11 DIAGNOSIS — M25572 Pain in left ankle and joints of left foot: Secondary | ICD-10-CM | POA: Diagnosis not present

## 2016-12-11 DIAGNOSIS — M779 Enthesopathy, unspecified: Secondary | ICD-10-CM | POA: Diagnosis not present

## 2016-12-11 MED ORDER — TRIAMCINOLONE ACETONIDE 10 MG/ML IJ SUSP
10.0000 mg | Freq: Once | INTRAMUSCULAR | Status: AC
  Administered 2016-12-11: 10 mg

## 2016-12-11 NOTE — Progress Notes (Signed)
   Subjective:    Patient ID: Tammie West, female    DOB: September 19, 1932, 83 y.o.   MRN: [id number]  HPI    Review of Systems  All other systems reviewed and are negative.      Objective:   Physical Exam        Assessment & Plan:

## 2016-12-12 NOTE — Progress Notes (Signed)
Subjective:    Patient ID: Tammie West, female   DOB: 83 y.o.   MRN: [id number]   HPI patient presents she's got a a lot of pain in the outside of the left ankle and states it's been present for several months. Does not remember specific injury and also is concerned that there could be any kind of breakdown of tissue should does not smoke    Review of Systems  All other systems reviewed and are negative.       Objective:  Physical Exam  Constitutional: She appears well-developed and well-nourished.  Cardiovascular: Intact distal pulses.  Pulmonary/Chest: Effort normal.  Musculoskeletal: Normal range of motion.  Neurological: She is alert.  Skin: Skin is warm.  Nursing note and vitals reviewed.  neurovascular status was found to be intact muscle strength was adequate range of motion within normal limits except splinting on the left side due to pain with quite a bit of inflammation lateral ankle gutter left and around the peroneal tendon with no indication of tendon dysfunction or tear. Found have good digital perfusion well oriented 3     Assessment:    Inflammatory tendinitis of the peroneal tendon underneath the lateral malleolus     Plan:   H&P and careful sheath injection administered to the lateral side of the tendon 3 mg Kenalog 5 mill grams Xylocaine advised on heat ice therapy anti-inflammatories and reappoint to recheck  X-rays were negative for signs of fracture or any kind of bone injury or arthritis

## 2016-12-16 DIAGNOSIS — K5904 Chronic idiopathic constipation: Secondary | ICD-10-CM | POA: Insufficient documentation

## 2016-12-16 DIAGNOSIS — R143 Flatulence: Secondary | ICD-10-CM | POA: Insufficient documentation

## 2016-12-30 DIAGNOSIS — Z79899 Other long term (current) drug therapy: Secondary | ICD-10-CM | POA: Diagnosis not present

## 2016-12-30 DIAGNOSIS — G894 Chronic pain syndrome: Secondary | ICD-10-CM | POA: Diagnosis not present

## 2016-12-30 DIAGNOSIS — Z79891 Long term (current) use of opiate analgesic: Secondary | ICD-10-CM | POA: Diagnosis not present

## 2016-12-30 DIAGNOSIS — M549 Dorsalgia, unspecified: Secondary | ICD-10-CM | POA: Diagnosis not present

## 2016-12-30 DIAGNOSIS — M1990 Unspecified osteoarthritis, unspecified site: Secondary | ICD-10-CM | POA: Diagnosis not present

## 2017-01-18 DIAGNOSIS — I340 Nonrheumatic mitral (valve) insufficiency: Secondary | ICD-10-CM | POA: Insufficient documentation

## 2017-01-18 NOTE — Progress Notes (Signed)
Cardiology Office Note:    Date:  01/19/2017   ID:  Tammie West, DOB February 01, 1932, MRN [id number]  PCP:  Tammie Cha, MD  Cardiologist:  Tammie Him, MD   Referring MD: Tammie West,*   Chief Complaint  Patient presents with  . Coronary Artery Disease  . Hypertension  . Congestive Heart Failure    History of Present Illness:    Tammie West is a 83 y.o. female with a hx of HTN, nonobstructive ASCAD with Prox RCA to Mid RCA lesion, 45% stenosed and Ost LAD to Prox LAD lesion, 30% stenosed by cath and high calcium score by coronary CTA. She also has dyslipidemia, chronic diastolic CHF, moderate MR by echo 04/2016 and chronic LE edema and continues with tobacco. She says that she has a lot of anxiety with her chronic back pain and uses tobacco as a crutch.   She is here today for followup and is doing well.  She denies any chest pain or pressure, SOB, DOE, PND, orthopnea, dizziness, palpitations or syncope. Occasionally she will have some mild LE edema.  Her main complaint is pain with her arthritis.She is compliant with her meds and is tolerating meds with no SE.    Past Medical History:  Diagnosis Date  . Carpal tunnel syndrome   . CKD (chronic kidney disease) stage 4, GFR 15-29 ml/min (HCC)   . Coronary artery disease 11/2014    cath with Prox RCA to Mid RCA lesion, 45% stenosed and Ost LAD to Prox LAD lesion, 30% stenosed byt recent cath and high calcium score by coronary CTA.  . Crohn's disease ([place])   . Depression   . Diabetes mellitus without complication ([place])   . GERD (gastroesophageal reflux disease)   . Goiter   . Hyperlipidemia   . Hypertension   . Hypothyroidism   . Insomnia   . Migraine   . Osteoarthritis   . Osteoporosis   . Trochanteric bursitis     Past Surgical History:  Procedure Laterality Date  . ABDOMINAL HYSTERECTOMY    . APPENDECTOMY    . CARDIAC CATHETERIZATION N/A 12/13/2014   Procedure: Left Heart Cath  and Coronary Angiography;  Surgeon: Belva Crome, MD;  Location: [facility];  Service: Cardiovascular;  Laterality: N/A;    Current Medications: Current Meds  Medication Sig  . amitriptyline (ELAVIL) 25 MG tablet Take 25 mg by mouth at bedtime.  Marland Kitchen amLODipine (NORVASC) 5 MG tablet Take 5 mg by mouth daily.  Marland Kitchen aspirin EC 81 MG tablet Take 1 tablet (81 mg total) by mouth daily.  . clidinium-chlordiazePOXIDE (LIBRAX) 5-2.5 MG capsule Take 1 capsule by mouth 2 (two) times daily.  Marland Kitchen escitalopram (LEXAPRO) 10 MG tablet Take 10 mg by mouth daily.  . furosemide (LASIX) 20 MG tablet Take 1 tablet (20 mg total) by mouth daily.  . insulin glargine (LANTUS) 100 unit/mL SOPN Inject 6 Units into the skin daily after breakfast.   . levothyroxine (SYNTHROID, LEVOTHROID) 75 MCG tablet Take 75 mcg by mouth daily before breakfast.   . mesalamine (LIALDA) 1.2 G EC tablet Take 2.4 g by mouth daily after breakfast.  . Morphine-Naltrexone (EMBEDA) 30-1.2 MG CPCR Take 1 capsule by mouth daily.   . nitroGLYCERIN (NITROSTAT) 0.4 MG SL tablet Place 1 tablet (0.4 mg total) under the tongue every 5 (five) minutes as needed for chest pain.  . polyethylene glycol (MIRALAX / GLYCOLAX) packet Take 17 g by mouth daily as needed for mild constipation.  Marland Kitchen  Probiotic Product (ALIGN PO) Take 1 capsule by mouth daily after breakfast.  . ranitidine (ZANTAC) 150 MG capsule Take 1 capsule by mouth daily.  . simvastatin (ZOCOR) 10 MG tablet Take 10 mg by mouth at bedtime.   . [DISCONTINUED] hydrochlorothiazide (HYDRODIURIL) 25 MG tablet Take 25 mg by mouth daily.     Allergies:   Atorvastatin; Byetta 10 mcg pen [exenatide]; Cefaclor; Codeine; Crestor [rosuvastatin]; Dexilant [dexlansoprazole]; Erythromycin; Fosamax [alendronate sodium]; Macrobid [nitrofurantoin monohyd macro]; Metformin and related; Nsaids; Penicillins; and Shellfish allergy   Social History   Socioeconomic History  . Marital status: Married    Spouse  name: None  . Number of children: None  . Years of education: None  . Highest education level: None  Social Needs  . Financial resource strain: None  . Food insecurity - worry: None  . Food insecurity - inability: None  . Transportation needs - medical: None  . Transportation needs - non-medical: None  Occupational History  . None  Tobacco Use  . Smoking status: Current Some Day Smoker    Packs/day: 0.25    Types: Cigarettes  . Smokeless tobacco: Never Used  Substance and Sexual Activity  . Alcohol use: No  . Drug use: No  . Sexual activity: None  Other Topics Concern  . None  Social History Narrative  . None     Family History: The patient's family history includes CAD in her brother; Emphysema in her father; Heart disease in her brother. There is no history of Heart attack or Stroke.  ROS:   Please see the history of present illness.    Review of Systems  Musculoskeletal: Positive for back pain, joint swelling and muscle cramps.  Psychiatric/Behavioral: The patient is nervous/anxious.     All other systems reviewed and negative.   EKGs/Labs/Other Studies Reviewed:    The following studies were reviewed today: none  EKG:  EKG is not ordered today.    Recent Labs: 05/05/2016: Brain Natriuretic Peptide 179.1 07/04/2016: BUN 26; Creatinine, Ser 1.62; Potassium 4.1; Sodium 143   Recent Lipid Panel    Component Value Date/Time   CHOL 105 01/08/2016 0941   TRIG 91 01/08/2016 0941   HDL 53 01/08/2016 0941   CHOLHDL 2.0 01/08/2016 0941   VLDL 18 01/08/2016 0941   LDLCALC 34 01/08/2016 0941    Physical Exam:    VS:  BP 108/78   Pulse 75   Ht 5\' 4"  (1.626 m)   Wt 122 lb 6.4 oz (55.5 kg)   SpO2 95%   BMI 21.01 kg/m     Wt Readings from Last 3 Encounters:  01/19/17 122 lb 6.4 oz (55.5 kg)  07/18/16 126 lb 1.9 oz (57.2 kg)  06/26/16 131 lb 1.9 oz (59.5 kg)     GEN:  Well nourished, well developed in no acute distress HEENT: Normal NECK: No JVD; No carotid  bruits LYMPHATICS: No lymphadenopathy CARDIAC: RRR, no murmurs, rubs, gallops RESPIRATORY:  Clear to auscultation without rales, wheezing or rhonchi  ABDOMEN: Soft, non-tender, non-distended MUSCULOSKELETAL:  No edema; No deformity  SKIN: Warm and dry NEUROLOGIC:  Alert and oriented x 3 PSYCHIATRIC:  Normal affect   ASSESSMENT:    1. Chronic diastolic CHF (congestive heart failure) ([place])   2. Essential hypertension   3. Non-rheumatic mitral regurgitation   4. Coronary artery disease involving native coronary artery of native heart without angina pectoris   5. Hyperlipidemia with target LDL less than 70    PLAN:  In order of problems listed above:  1. Chronic diastolic heart failure/Bilateral lower extremity edema - lShe appears euvolemic on exam. She will continue on Lasix 20mg  daily.   2. HTN - BP is controlled on exam today  - she will continue on amlodipine 5mg  daily.    3. Mitral Regurgitation - 2D echo 04/2016 showed moderate MR with normal LVF. Repeat echo 04/2017.  4.  Nonobstructive ASCAD - cath with Prox RCA to Mid RCA lesion, 45% stenosed and Ost LAD to Prox LAD lesion, 30% stenosed by cath and high calcium score by coronary CTA.  She denies any anginal symptoms.  She will continue on statin and ASA.    5.  Hyperlipidemia with LDL goal < 70.  She will continue on simvastatin 10mg  daily.  I will repeat FLP and ALT.      Medication Adjustments/Labs and Tests Ordered: Current medicines are reviewed at length with the patient today.  Concerns regarding medicines are outlined above.  No orders of the defined types were placed in this encounter.  No orders of the defined types were placed in this encounter.   Signed, Tammie Him, MD  01/19/2017 9:05 AM    [place]

## 2017-01-19 ENCOUNTER — Ambulatory Visit: Payer: Medicare HMO | Admitting: Cardiology

## 2017-01-19 ENCOUNTER — Encounter: Payer: Self-pay | Admitting: Cardiology

## 2017-01-19 VITALS — BP 108/78 | HR 75 | Ht 64.0 in | Wt 122.4 lb

## 2017-01-19 DIAGNOSIS — I340 Nonrheumatic mitral (valve) insufficiency: Secondary | ICD-10-CM | POA: Diagnosis not present

## 2017-01-19 DIAGNOSIS — I5032 Chronic diastolic (congestive) heart failure: Secondary | ICD-10-CM

## 2017-01-19 DIAGNOSIS — I2510 Atherosclerotic heart disease of native coronary artery without angina pectoris: Secondary | ICD-10-CM | POA: Diagnosis not present

## 2017-01-19 DIAGNOSIS — R350 Frequency of micturition: Secondary | ICD-10-CM | POA: Diagnosis not present

## 2017-01-19 DIAGNOSIS — E785 Hyperlipidemia, unspecified: Secondary | ICD-10-CM | POA: Diagnosis not present

## 2017-01-19 DIAGNOSIS — I10 Essential (primary) hypertension: Secondary | ICD-10-CM | POA: Diagnosis not present

## 2017-01-19 MED ORDER — NITROGLYCERIN 0.4 MG SL SUBL: 0.4000 mg | SUBLINGUAL_TABLET | SUBLINGUAL | 3 refills | Status: DC | PRN

## 2017-01-19 NOTE — Patient Instructions (Signed)
Medication Instructions:  Your physician recommends that you continue on your current medications as directed. Please refer to the Current Medication list given to you today.  Labwork: Your physician recommends that you return for lab work on Thursday December 27th at 7:30-4:45PM Must be fasting for lipids, liver function and kidney function test  Testing/Procedures: None ordered   Follow-Up: Your physician wants you to follow-up in: 6 months with Dr. Radford Pax. You will receive a reminder letter in the mail two months in advance. If you don't receive a letter, please call our office to schedule the follow-up appointment.  Any Other Special Instructions Will Be Listed Below (If Applicable).     If you need a refill on your cardiac medications before your next appointment, please call your pharmacy.

## 2017-01-21 ENCOUNTER — Other Ambulatory Visit: Payer: Self-pay | Admitting: Physician Assistant

## 2017-01-21 ENCOUNTER — Ambulatory Visit
Admission: RE | Admit: 2017-01-21 | Discharge: 2017-01-21 | Disposition: A | Discharge status: Home / Self Care | Payer: Medicare HMO | Source: Ambulatory Visit | Attending: Physician Assistant | Admitting: Physician Assistant

## 2017-01-21 DIAGNOSIS — M1990 Unspecified osteoarthritis, unspecified site: Secondary | ICD-10-CM | POA: Diagnosis not present

## 2017-01-21 DIAGNOSIS — M546 Pain in thoracic spine: Secondary | ICD-10-CM

## 2017-01-21 DIAGNOSIS — M549 Dorsalgia, unspecified: Secondary | ICD-10-CM | POA: Diagnosis not present

## 2017-01-21 DIAGNOSIS — Z79891 Long term (current) use of opiate analgesic: Secondary | ICD-10-CM | POA: Diagnosis not present

## 2017-01-21 DIAGNOSIS — M47814 Spondylosis without myelopathy or radiculopathy, thoracic region: Secondary | ICD-10-CM | POA: Diagnosis not present

## 2017-01-21 DIAGNOSIS — G894 Chronic pain syndrome: Secondary | ICD-10-CM | POA: Diagnosis not present

## 2017-01-21 DIAGNOSIS — Z79899 Other long term (current) drug therapy: Secondary | ICD-10-CM | POA: Diagnosis not present

## 2017-01-22 ENCOUNTER — Other Ambulatory Visit: Payer: Medicare HMO

## 2017-01-28 ENCOUNTER — Other Ambulatory Visit: Payer: Medicare HMO | Admitting: *Deleted

## 2017-01-28 DIAGNOSIS — E785 Hyperlipidemia, unspecified: Secondary | ICD-10-CM | POA: Diagnosis not present

## 2017-01-28 DIAGNOSIS — I5032 Chronic diastolic (congestive) heart failure: Secondary | ICD-10-CM

## 2017-01-28 LAB — BASIC METABOLIC PANEL
BUN/Creatinine Ratio: 18 (ref 12–28)
BUN: 20 mg/dL (ref 8–27)
CO2: 26 mmol/L (ref 20–29)
CREATININE: 1.11 mg/dL — AB (ref 0.57–1.00)
Calcium: 9.9 mg/dL (ref 8.7–10.3)
Chloride: 103 mmol/L (ref 96–106)
GFR calc Af Amer: 53 mL/min/{1.73_m2} — ABNORMAL LOW (ref >59)
GFR, EST NON AFRICAN AMERICAN: 46 mL/min/{1.73_m2} — AB (ref >59)
Glucose: 91 mg/dL (ref 65–99)
Potassium: 3.7 mmol/L (ref 3.5–5.2)
Sodium: 144 mmol/L (ref 134–144)

## 2017-01-28 LAB — LIPID PANEL
CHOL/HDL RATIO: 2 ratio (ref 0.0–4.4)
Cholesterol, Total: 104 mg/dL (ref 100–199)
HDL: 52 mg/dL (ref >39)
LDL CALC: 32 mg/dL (ref 0–99)
Triglycerides: 101 mg/dL (ref 0–149)
VLDL Cholesterol Cal: 20 mg/dL (ref 5–40)

## 2017-01-28 LAB — HEPATIC FUNCTION PANEL
ALBUMIN: 3.3 g/dL — AB (ref 3.5–4.7)
ALK PHOS: 83 IU/L (ref 39–117)
ALT: 10 IU/L (ref 0–32)
AST: 14 IU/L (ref 0–40)
BILIRUBIN, DIRECT: 0.11 mg/dL (ref 0.00–0.40)
Bilirubin Total: 0.2 mg/dL (ref 0.0–1.2)
Total Protein: 5.8 g/dL — ABNORMAL LOW (ref 6.0–8.5)

## 2017-01-30 ENCOUNTER — Other Ambulatory Visit: Payer: Self-pay | Admitting: Pain Medicine

## 2017-01-30 DIAGNOSIS — S22000A Wedge compression fracture of unspecified thoracic vertebra, initial encounter for closed fracture: Secondary | ICD-10-CM

## 2017-02-03 DIAGNOSIS — M810 Age-related osteoporosis without current pathological fracture: Secondary | ICD-10-CM | POA: Diagnosis not present

## 2017-02-03 DIAGNOSIS — R609 Edema, unspecified: Secondary | ICD-10-CM | POA: Diagnosis not present

## 2017-02-03 DIAGNOSIS — E039 Hypothyroidism, unspecified: Secondary | ICD-10-CM | POA: Diagnosis not present

## 2017-02-03 DIAGNOSIS — E119 Type 2 diabetes mellitus without complications: Secondary | ICD-10-CM | POA: Diagnosis not present

## 2017-02-03 DIAGNOSIS — Z794 Long term (current) use of insulin: Secondary | ICD-10-CM | POA: Diagnosis not present

## 2017-02-03 DIAGNOSIS — G5601 Carpal tunnel syndrome, right upper limb: Secondary | ICD-10-CM | POA: Diagnosis not present

## 2017-02-03 DIAGNOSIS — E049 Nontoxic goiter, unspecified: Secondary | ICD-10-CM | POA: Diagnosis not present

## 2017-02-04 ENCOUNTER — Ambulatory Visit
Admission: RE | Admit: 2017-02-04 | Discharge: 2017-02-04 | Disposition: A | Discharge status: Home / Self Care | Payer: Medicare HMO | Source: Ambulatory Visit | Attending: Pain Medicine | Admitting: Pain Medicine

## 2017-02-04 DIAGNOSIS — S22000A Wedge compression fracture of unspecified thoracic vertebra, initial encounter for closed fracture: Secondary | ICD-10-CM

## 2017-02-04 DIAGNOSIS — M4804 Spinal stenosis, thoracic region: Secondary | ICD-10-CM | POA: Diagnosis not present

## 2017-02-10 ENCOUNTER — Encounter: Payer: Self-pay | Admitting: Occupational Therapy

## 2017-02-10 ENCOUNTER — Ambulatory Visit: Payer: Medicare HMO | Attending: Internal Medicine | Admitting: Occupational Therapy

## 2017-02-10 DIAGNOSIS — M6281 Muscle weakness (generalized): Secondary | ICD-10-CM

## 2017-02-10 DIAGNOSIS — M25642 Stiffness of left hand, not elsewhere classified: Secondary | ICD-10-CM

## 2017-02-10 DIAGNOSIS — M25641 Stiffness of right hand, not elsewhere classified: Secondary | ICD-10-CM

## 2017-02-10 DIAGNOSIS — R208 Other disturbances of skin sensation: Secondary | ICD-10-CM | POA: Diagnosis present

## 2017-02-10 DIAGNOSIS — M79642 Pain in left hand: Secondary | ICD-10-CM | POA: Diagnosis present

## 2017-02-10 DIAGNOSIS — R600 Localized edema: Secondary | ICD-10-CM

## 2017-02-10 DIAGNOSIS — M79641 Pain in right hand: Secondary | ICD-10-CM

## 2017-02-10 NOTE — Therapy (Signed)
[facility] [address] [place], [place], [address] Phone: [phone]   Fax:  [phone]  Occupational Therapy Evaluation  Patient Details  Name: DEVYNNE STURDIVANT MRN: [id number] Date of Birth: Mar 16, 1940 Referring Provider: Dr. Delrae Rend   Encounter Date: 02/10/2017  OT End of Session - 02/10/17 1918    Visit Number  1    Number of Visits  17    Date for OT Re-Evaluation  04/11/17    Authorization Type  [employer]     OT Start Time  1103    OT Stop Time  1156    OT Time Calculation (min)  53 min    Activity Tolerance  Patient limited by pain    Behavior During Therapy  [facility] for tasks assessed/performed       Past Medical History:  Diagnosis Date  . Carpal tunnel syndrome   . CKD (chronic kidney disease) stage 4, GFR 15-29 ml/min (HCC)   . Coronary artery disease 11/2014    cath with Prox RCA to Mid RCA lesion, 45% stenosed and Ost LAD to Prox LAD lesion, 30% stenosed byt recent cath and high calcium score by coronary CTA.  . Crohn's disease ([place])   . Depression   . Diabetes mellitus without complication ([place])   . GERD (gastroesophageal reflux disease)   . Goiter   . Hyperlipidemia   . Hypertension   . Hypothyroidism   . Insomnia   . Migraine   . Osteoarthritis   . Osteoporosis   . Trochanteric bursitis     Past Surgical History:  Procedure Laterality Date  . ABDOMINAL HYSTERECTOMY    . APPENDECTOMY    . CARDIAC CATHETERIZATION N/A 12/13/2014   Procedure: Left Heart Cath and Coronary Angiography;  Surgeon: Belva Crome, MD;  Location: [facility];  Service: Cardiovascular;  Laterality: N/A;    There were no vitals filed for this visit.  Subjective Assessment - 02/10/17 1112    Subjective   hand pain started about a month ago;   I don't want surgery if I can avoid it.  "it feels like it does if you hit your funny bone."  Dtr. reports pt screams out in pain at night.  Pt reports  splints feel good.    Patient is accompained by:  Family member daughter    Pertinent History  Bilateral Carpal Tunnel Syndrome; DM, HTN, Crohn's disease, hypothyroidism, osteoporosis, depression, renal failure, GERD, OA, Trochanteric bursitis, CAD, mitral regurgitation, Chronic diastolic CHF, hx of chronic back pain (managed by pain management), scoliosis    Limitations  fall risk, hx of chronic back pain (managed by pain management)    Patient Stated Goals  improve bilateral hand use and pain    Currently in Pain?  Yes    Pain Score  9     Pain Location  Hand wrist    Pain Descriptors / Indicators  Shooting;Tingling;Numbness;Stabbing    Pain Onset  More than a month ago    Pain Frequency  Constant    Aggravating Factors   at night    Pain Relieving Factors  nothing        [facility] OT Assessment - 02/10/17 0001      Assessment   Medical Diagnosis  Capal Tunnel Syndrome both hands    Referring Provider  Dr. Delrae Rend    Onset Date/Surgical Date  -- approx 1 month ago    Hand Dominance  Right    Next  MD Visit  will be scheduled for nerve conduction test, recent thorasic spine MRI (follow up appt later this week)    Prior Therapy  pain management for back      Precautions   Precautions  None      Balance Screen   Has the patient fallen in the past 6 months  No      Home  Environment   Family/patient expects to be discharged to:  Private residence    Lives With  Spouse husband to have heart surgery soon      Prior Function   Level of Independence  Independent with basic ADLs      ADL   Eating/Feeding  Needs assist with cutting food difficulty     Grooming  Modified independent difficulty    Upper Body Bathing  Maximal assistance    Lower Body Bathing  Maximal assistance    Upper Body Dressing  Maximal assistance    Lower Body Dressing  Maximal assistance    Toilet Transfer  Modified independent grab bars, toilet seat    Toileting - Clothing Manipulation  Modified  independent    Toileting -  Hygiene  Modified Independent    Tub/Shower Transfer  Moderate assistance      IADL   Prior Level of Function Light Housekeeping  not performing prior due to chronic back pain    Prior Level of Function Meal Prep  mod I     Meal Prep  -- max A (can't peel, cut, carry, open)    Prior Level of Function Community Mobility  limited driving prior, now       Mobility   Mobility Status  -- fall risk    Mobility Status Comments  has cane, 4 wheeled RW with difficulty      Sensation   Additional Comments  pt reports pain, numbness, tingling in bilateral hands (more in thumb and 2-3/4th digit)      Coordination   Coordination  pt with difficulty with finger opposition and limited grasp/functional use of bilateral hands due to decr ROM and pain      ROM / Strength   AROM / PROM / Strength  AROM;PROM      AROM   Overall AROM   Deficits    Overall AROM Comments  RUE:  shoulder flex to approx 45*, elbow ext approx 75%, wrist ext 15*, wrist flex 25*, gross finger ext approx 85%, gross finger flex approx 75%, unable to fully oppose to 2nd digit.  LUE:  shoulder flex 90*, elbow flex/ext 90%, wrist ext 25*, wrist flex 55*, gross finger flex 75%, gross finger ext 90%, able to oppose to 2nd-3rd digit with significant difficulty all UE movements slow, painful      PROM   Overall PROM   Deficits pain limits      Hand Function   Right Hand Gross Grasp  Impaired    Left Hand Gross Grasp  Impaired               OT Treatments/Exercises (OP) - 02/10/17 0001      ADLs   ADL Education Given  Yes    Foam Grip for Utensils/Toothbrush/Pen  red foam issued       Splinting   Splinting  Pt fitted for bilateral wrist cock-up splints (pre-fab).  Recommended pt wear at night and as much as possible when using hands during the day, removing if incr pain.  OT Education - 02/10/17 1525    Education provided  Yes    Education Details  OT eval results/POC;  bilateral wrist cock-up splint wear/care and rationale; built up red foam grip for utensils; typical CTS symptoms    Person(s) Educated  Patient;Child(ren)    Methods  Explanation;Demonstration;Verbal cues    Comprehension  Verbalized understanding       OT Short Term Goals - 02/10/17 2039      OT SHORT TERM GOAL #1   Title  Pt/caregiver will be independent with splint wear/care.--check STGs 03/14/17    Time  4    Period  Weeks    Status  New      OT SHORT TERM GOAL #2   Title  Pt will be independent with initial HEP and edema management techniques.    Time  4    Period  Weeks    Status  New      OT SHORT TERM GOAL #3   Title  Pt will be independent with activity modification to reduce pain.    Time  4    Period  Weeks    Status  New      OT SHORT TERM GOAL #4   Title  Pt will demo at least 75% gross finger flex bilaterally for improved grasp of objects for functional tasks.    Time  4    Period  Weeks    Status  New      OT SHORT TERM GOAL #5   Title  Pt will demo at least 45* wrist extension bilaterally for improved UE functional use.    Time  4    Period  Weeks    Status  New      Additional Short Term Goals   Additional Short Term Goals  Yes      OT SHORT TERM GOAL #6   Title  Pt will perform BADLs with mod A.    Time  4    Period  Weeks    Status  New        OT Long Term Goals - 02/10/17 2045      OT LONG TERM GOAL #1   Title  Pt will be independent with updated HEP.--check LTGs 04/11/17    Time  8    Period  Weeks    Status  New      OT LONG TERM GOAL #2   Title  Pt will perform BADLs with only occasional min A using AE/strategies prn.    Time  8    Period  Weeks    Status  New      OT LONG TERM GOAL #3   Title  Pt will demo at least 90% gross finger flex/ext bilaterally for improved grasp/release of objects.    Time  8    Period  Weeks    Status  New      OT LONG TERM GOAL #4   Title  Pt will report pain less than or equal to 4/10 for light  ADLs.    Time  8    Period  Weeks    Status  New      OT LONG TERM GOAL #5   Title  Pt will be able to perform cooking tasks with min A.    Time  8    Period  Weeks    Status  New            Plan - 02/10/17  1924    Clinical Impression Statement  Pt is an 84 y.o. female referred to occupational therapy with diagnosis of bilateral CTS.  Pt with PMH that includes:  chronic back pain (goes to pain management for this), scoliosis, DM, HTN, Crohn's disease, hypothyroidism, osteoporosis, depression, renal failure, GERD, OA, Trochanteric bursitis, CAD, mitral regurgitation, Chronic diastolic CHF.  Pt presents with decr ROM, decr strength, pain, numbness, swelling, and decr UE functional use affecting ADL/IADL performance.  Pt reports pain and demo decr ROM in hands/wrists and throughout UEs.  Pt would benefit from occupational therapy to address for improved ADLs/IADL performance and incr UE functional use and reduce caregiver burden (particularly due to husband's upcoming heart surgery).      Occupational Profile and client history currently impacting functional performance  Pt was independent with BADLs and performing cooking prior to signifcant symptoms beginning approx 1 month ago.  Now pt needs significant assistance with BADLs and cooking.  Pt also seeing pain management for upper back pain.  Pt's husband (who has been assisting her) is having heart surgery soon; therefore, pt/husband will need additional assistance as he will be unable to help pt as he is currently.    Occupational performance deficits (Please refer to evaluation for details):  ADL's;IADL's;Rest and Sleep;Leisure    Rehab Potential  Fair    Current Impairments/barriers affecting progress:  chronic back pain; also having proximal UE pain    OT Frequency  2x / week    OT Duration  8 weeks +eval    OT Treatment/Interventions  Self-care/ADL training;Moist Heat;Fluidtherapy;DME and/or AE instruction;Splinting;Therapeutic  activities;Contrast Bath;Ultrasound;Therapeutic exercise;Passive range of motion;Cryotherapy;Electrical Stimulation;Paraffin;Manual Therapy;Patient/family education    Plan  initiate gentle AROM/AAROM HEP (hand/wrist, elbow, shoulder); ?fludio vs. hot pack, edema management, check wrist splints    Clinical Decision Making  Several treatment options, min-mod task modification necessary    Consulted and Agree with Plan of Care  Patient       Patient will benefit from skilled therapeutic intervention in order to improve the following deficits and impairments:  Increased edema, Pain, Decreased coordination, Impaired sensation, Decreased strength, Decreased range of motion, Decreased activity tolerance, Impaired UE functional use  Visit Diagnosis: Stiffness of right hand, not elsewhere classified  Stiffness of left hand, not elsewhere classified  Pain in left hand  Pain in right hand  Muscle weakness (generalized)  Other disturbances of skin sensation  Localized edema    Problem List Patient Active Problem List   Diagnosis Date Noted  . Mitral regurgitation 01/18/2017  . Chronic diastolic CHF (congestive heart failure) ([place]) 07/18/2016  . Bilateral lower extremity edema 05/05/2016  . Angina effort ([place])   . CAD (coronary artery disease), native coronary artery 11/28/2014  . Tobacco abuse 09/11/2014  . Chest pain 02/23/2013  . Diabetes mellitus without complication ([place])   . Hypertension   . Crohn's disease ([place])   . Insomnia   . Hypothyroidism   . Goiter   . Osteoporosis   . Hyperlipidemia with target LDL less than 70   . Depression   . Carpal tunnel syndrome   . Renal failure   . GERD (gastroesophageal reflux disease)   . Osteoarthritis   . Trochanteric bursitis   . Migraine     [facility] 02/10/2017, 8:56 PM  [facility] [address] [place], [place], [address] Phone: [phone]   Fax:   [phone]  Name: MISTI TOWLE MRN: [id number] Date of Birth: [birth year]   Vianne Bulls, OTR/L  [facility] [address] [place], [place]  [address] [phone] phone [phone] 02/10/17 8:56 PM

## 2017-02-11 ENCOUNTER — Telehealth: Payer: Self-pay | Admitting: Cardiology

## 2017-02-11 NOTE — Telephone Encounter (Signed)
Spoke with patient regarding her swollen legs and feet.  The patient has been diagnosed in the past with bilateral lower extremity edema.  She had a doctor visit with a neurologist (hand) yesterday so she was walking about.    She said that they were quite swollen this morning, but after she has elevated them, the swelling seems to have decreased.    She is on 20mg  of Lasix daily.    She has compression stockings which she wears daily, but does not have them on now, because there is some discomfort in her legs.  Overall, she called because of her concern that her legs and ankles are more swollen than usual.

## 2017-02-11 NOTE — Telephone Encounter (Signed)
Please have her see her PCP 1/17.

## 2017-02-11 NOTE — Telephone Encounter (Signed)
Returned call to patient regarding her swollen legs, ankles and feet.    She informed me that her Left foot to lower calf is still  swollen, without much decrease from earlier.  Her Right foot is not quite as bad.    She did not ingest much salt yesterday (one piece of cheese, cracker, and toast with butter.    She is not SOB, just uncomfortable in the legs.    She is reluctant to wear compression stockings, because of the discomfort in her legs.  Thank you.Marland Kitchen

## 2017-02-11 NOTE — Telephone Encounter (Signed)
New message    Daughter said that pain management is trying to work with her with the back pain   Pt c/o swelling: STAT is pt has developed SOB within 24 hours  1) How much weight have you gained and in what time span?  no  2) If swelling, where is the swelling located? Feet and ankle and her hands sometimes   3) Are you currently taking a fluid pill? Yes   4) Are you currently SOB?  no  5) Do you have a log of your daily weights (if so, list)?  no  6) Have you gained 3 pounds in a day or 5 pounds in a week?  no  7) Have you traveled recently?  no

## 2017-02-12 NOTE — Telephone Encounter (Signed)
Returned call to patient. Informed patient of Dr. Theodosia Blender recommendation to make an appt with her primary MD. Patient in agreement with plan and thanked me for the call

## 2017-02-13 DIAGNOSIS — M1990 Unspecified osteoarthritis, unspecified site: Secondary | ICD-10-CM | POA: Diagnosis not present

## 2017-02-13 DIAGNOSIS — G894 Chronic pain syndrome: Secondary | ICD-10-CM | POA: Diagnosis not present

## 2017-02-13 DIAGNOSIS — M549 Dorsalgia, unspecified: Secondary | ICD-10-CM | POA: Diagnosis not present

## 2017-02-17 ENCOUNTER — Encounter: Payer: Self-pay | Admitting: Neurology

## 2017-02-17 ENCOUNTER — Ambulatory Visit: Payer: Medicare HMO | Admitting: Neurology

## 2017-02-17 ENCOUNTER — Ambulatory Visit (INDEPENDENT_AMBULATORY_CARE_PROVIDER_SITE_OTHER): Payer: Medicare HMO | Admitting: Neurology

## 2017-02-17 DIAGNOSIS — G5603 Carpal tunnel syndrome, bilateral upper limbs: Secondary | ICD-10-CM | POA: Diagnosis not present

## 2017-02-17 HISTORY — DX: Carpal tunnel syndrome, bilateral upper limbs: G56.03

## 2017-02-17 NOTE — Procedures (Signed)
     HISTORY:  Tammie West is an 84 year old patient with a history of diabetes.  The patient reports that she has been having intermittent numbness of both hands for several years.  Within the last month she has had severe pain that has involved primarily the right hand with discomfort to the elbow and shoulder as well.  She has some discomfort on the left side also.  She denies any neck pain or pain radiating from the neck down the arms.  The patient is being evaluated for a possible neuropathy or a cervical radiculopathy.  NERVE CONDUCTION STUDIES:  Nerve conduction studies were performed on both upper extremities.  The distal motor latencies for the median nerves were significantly prolonged bilaterally with low motor amplitudes for these nerves bilaterally.  The distal motor latencies for the ulnar nerves were prolonged on the left, normal on the right, with a low motor amplitude on the left, normal on the right.  The nerve conduction velocities for the right median nerve and ulnar nerves bilaterally were normal, with the nerve conduction velocity for the left median nerve was slowed.  The sensory latencies for the median nerves were unobtainable bilaterally, and were normal for the ulnar nerves bilaterally.  The F-wave latencies for the median nerves were prolonged bilaterally, normal for the ulnar nerves bilaterally.  EMG STUDIES:  EMG study was performed on the right upper extremity:  The first dorsal interosseous muscle reveals 2 to 4 K units with full recruitment. No fibrillations or positive waves were noted. The abductor pollicis brevis muscle reveals 2 to 4 K units with decreased recruitment. No fibrillations or positive waves were noted. The extensor indicis proprius muscle reveals 1 to 3 K units with full recruitment. No fibrillations or positive waves were noted. The pronator teres muscle reveals 2 to 3 K units with full recruitment. No fibrillations or positive waves were  noted. The biceps muscle reveals 1 to 2 K units with full recruitment. No fibrillations or positive waves were noted. The triceps muscle reveals 2 to 4 K units with full recruitment. No fibrillations or positive waves were noted. The anterior deltoid muscle reveals 2 to 3 K units with full recruitment. No fibrillations or positive waves were noted. The cervical paraspinal muscles were tested at 2 levels. No abnormalities of insertional activity were seen at either level tested. There was fair relaxation.   IMPRESSION:  Nerve conduction studies done on both upper extremities shows evidence of bilateral carpal tunnel syndrome of moderate severity, slightly worse on the left.  EMG evaluation of the right upper extremity shows findings consistent with carpal tunnel syndrome, there does not appear to be evidence of an overlying cervical radiculopathy.  Jill Alexanders MD 02/17/2017 4:48 PM  [facility] [address] [place], [place] [address]  Phone [phone] Fax [phone]

## 2017-02-17 NOTE — Progress Notes (Signed)
Please refer to EMG and nerve conduction study procedure note. 

## 2017-02-18 ENCOUNTER — Ambulatory Visit: Payer: Medicare HMO | Admitting: Occupational Therapy

## 2017-02-18 DIAGNOSIS — M19049 Primary osteoarthritis, unspecified hand: Secondary | ICD-10-CM | POA: Diagnosis not present

## 2017-02-18 DIAGNOSIS — G5603 Carpal tunnel syndrome, bilateral upper limbs: Secondary | ICD-10-CM | POA: Diagnosis not present

## 2017-02-19 ENCOUNTER — Encounter: Payer: Self-pay | Admitting: Occupational Therapy

## 2017-02-19 NOTE — Therapy (Signed)
[facility] [address] [place], [place], [address] Phone: [phone]   Fax:  [phone]  Patient Details  Name: Tammie West MRN: [id number] Date of Birth: Jan 17, 1933 Referring Provider:  No ref. provider found  Encounter Date: 02/19/2017  OCCUPATIONAL THERAPY DISCHARGE SUMMARY  Visits from Start of Care: 1 (eval)  Current functional level related to goals / functional outcomes: See eval as pt did not return after eval   Remaining deficits: See eval as pt did not return after eval   Education / Equipment: Not completed as pt did not return  Plan: Patient agrees to discharge.  Patient goals were not met. Patient is being discharged due to not returning since the last visit.  Pt did not return after eval.  Pt went to the doctor and is confirmed to have CTS and will be having surgery.   Requested to cancel all appointments.?????           [facility] 02/19/2017, 1:02 PM  [facility] [address] [place], [place], [address] Phone: [phone]   Fax:  [place], OTR/L [facility] [address] [place], [place]  [address] [phone] phone [phone] 02/19/17 1:02 PM

## 2017-02-20 ENCOUNTER — Encounter: Payer: Medicare HMO | Admitting: Occupational Therapy

## 2017-02-23 ENCOUNTER — Encounter: Payer: Medicare HMO | Admitting: Occupational Therapy

## 2017-02-25 DIAGNOSIS — N3941 Urge incontinence: Secondary | ICD-10-CM | POA: Diagnosis not present

## 2017-02-25 DIAGNOSIS — R311 Benign essential microscopic hematuria: Secondary | ICD-10-CM | POA: Diagnosis not present

## 2017-02-26 ENCOUNTER — Encounter: Payer: Medicare HMO | Admitting: Occupational Therapy

## 2017-03-02 ENCOUNTER — Encounter: Payer: Medicare HMO | Admitting: Occupational Therapy

## 2017-03-04 DIAGNOSIS — G5601 Carpal tunnel syndrome, right upper limb: Secondary | ICD-10-CM | POA: Diagnosis not present

## 2017-03-04 DIAGNOSIS — G5602 Carpal tunnel syndrome, left upper limb: Secondary | ICD-10-CM | POA: Diagnosis not present

## 2017-03-05 ENCOUNTER — Encounter: Payer: Medicare HMO | Admitting: Occupational Therapy

## 2017-03-09 ENCOUNTER — Encounter: Payer: Medicare HMO | Admitting: Occupational Therapy

## 2017-03-10 ENCOUNTER — Telehealth: Payer: Self-pay

## 2017-03-10 NOTE — Telephone Encounter (Signed)
   [facility] Pre-operative Risk Assessment    Request for surgical clearance:  1. What type of surgery is being performed? Right Wrist: Bilateral CTR, Left CTR   2. When is this surgery scheduled? 04/14/17    3. What type of clearance is required (medical clearance vs. Pharmacy clearance to hold med vs. Both)? Pharmacy Clearance to hold med   4. Are there any medications that need to be held prior to surgery and how long? Instruction on any blood thinners.   5. Practice name and name of physician performing surgery? [facility] - Dr. Amedeo Plenty    6. What is your office phone and fax number? Please fax recommendations to Attn: Orson Slick Fax #: [phone] Phone [phone]   7. Anesthesia type (None, local, MAC, general) ? None listed   Jacinta Shoe 03/10/2017, 11:41 AM  _________________________________________________________________   (provider comments below)

## 2017-03-10 NOTE — Telephone Encounter (Signed)
Patient is not on any anticoagulation at this time.    Primary Cardiologist: Fransico Him, MD  Chart reviewed as part of pre-operative protocol coverage. Patient was contacted 03/10/2017 in reference to pre-operative risk assessment for pending surgery as outlined below.  Tammie West was last seen on 01/19/2017 by Dr.Turner  Since that day, Tammie West has done well  Therefore, based on ACC/AHA guidelines, the patient would be at acceptable risk for the planned procedure without further cardiovascular testing.   I will route this recommendation to the requesting party via Epic fax function and remove from pre-op pool.  Please call with questions.  Jory Sims DNP, ANP, AACC 03/10/2017, 1:59 PM

## 2017-03-11 NOTE — Telephone Encounter (Signed)
Clearance faxed to [facility] via [employer]

## 2017-03-12 ENCOUNTER — Encounter: Payer: Medicare HMO | Admitting: Occupational Therapy

## 2017-03-13 DIAGNOSIS — Z79891 Long term (current) use of opiate analgesic: Secondary | ICD-10-CM | POA: Diagnosis not present

## 2017-03-13 DIAGNOSIS — M549 Dorsalgia, unspecified: Secondary | ICD-10-CM | POA: Diagnosis not present

## 2017-03-13 DIAGNOSIS — M1990 Unspecified osteoarthritis, unspecified site: Secondary | ICD-10-CM | POA: Diagnosis not present

## 2017-03-13 DIAGNOSIS — G5600 Carpal tunnel syndrome, unspecified upper limb: Secondary | ICD-10-CM | POA: Diagnosis not present

## 2017-03-13 DIAGNOSIS — Z79899 Other long term (current) drug therapy: Secondary | ICD-10-CM | POA: Diagnosis not present

## 2017-03-13 DIAGNOSIS — G894 Chronic pain syndrome: Secondary | ICD-10-CM | POA: Diagnosis not present

## 2017-03-27 DIAGNOSIS — I129 Hypertensive chronic kidney disease with stage 1 through stage 4 chronic kidney disease, or unspecified chronic kidney disease: Secondary | ICD-10-CM | POA: Diagnosis not present

## 2017-03-27 DIAGNOSIS — Z720 Tobacco use: Secondary | ICD-10-CM | POA: Diagnosis not present

## 2017-03-27 DIAGNOSIS — N183 Chronic kidney disease, stage 3 (moderate): Secondary | ICD-10-CM | POA: Diagnosis not present

## 2017-03-27 DIAGNOSIS — R3129 Other microscopic hematuria: Secondary | ICD-10-CM | POA: Diagnosis not present

## 2017-04-02 DIAGNOSIS — Z794 Long term (current) use of insulin: Secondary | ICD-10-CM | POA: Diagnosis not present

## 2017-04-02 DIAGNOSIS — E119 Type 2 diabetes mellitus without complications: Secondary | ICD-10-CM | POA: Diagnosis not present

## 2017-04-02 DIAGNOSIS — G5601 Carpal tunnel syndrome, right upper limb: Secondary | ICD-10-CM | POA: Diagnosis not present

## 2017-04-02 DIAGNOSIS — M810 Age-related osteoporosis without current pathological fracture: Secondary | ICD-10-CM | POA: Diagnosis not present

## 2017-04-02 DIAGNOSIS — E039 Hypothyroidism, unspecified: Secondary | ICD-10-CM | POA: Diagnosis not present

## 2017-04-02 DIAGNOSIS — E049 Nontoxic goiter, unspecified: Secondary | ICD-10-CM | POA: Diagnosis not present

## 2017-04-10 DIAGNOSIS — G5600 Carpal tunnel syndrome, unspecified upper limb: Secondary | ICD-10-CM | POA: Diagnosis not present

## 2017-04-10 DIAGNOSIS — M1990 Unspecified osteoarthritis, unspecified site: Secondary | ICD-10-CM | POA: Diagnosis not present

## 2017-04-10 DIAGNOSIS — G894 Chronic pain syndrome: Secondary | ICD-10-CM | POA: Diagnosis not present

## 2017-04-10 DIAGNOSIS — M549 Dorsalgia, unspecified: Secondary | ICD-10-CM | POA: Diagnosis not present

## 2017-04-14 DIAGNOSIS — G5602 Carpal tunnel syndrome, left upper limb: Secondary | ICD-10-CM | POA: Diagnosis not present

## 2017-04-21 DIAGNOSIS — M79642 Pain in left hand: Secondary | ICD-10-CM | POA: Insufficient documentation

## 2017-04-24 DIAGNOSIS — M1990 Unspecified osteoarthritis, unspecified site: Secondary | ICD-10-CM | POA: Diagnosis not present

## 2017-04-24 DIAGNOSIS — I10 Essential (primary) hypertension: Secondary | ICD-10-CM | POA: Diagnosis not present

## 2017-04-24 DIAGNOSIS — G894 Chronic pain syndrome: Secondary | ICD-10-CM | POA: Diagnosis not present

## 2017-04-24 DIAGNOSIS — I509 Heart failure, unspecified: Secondary | ICD-10-CM | POA: Diagnosis not present

## 2017-04-27 ENCOUNTER — Other Ambulatory Visit (HOSPITAL_COMMUNITY): Payer: Medicare HMO

## 2017-04-28 DIAGNOSIS — M79642 Pain in left hand: Secondary | ICD-10-CM | POA: Diagnosis not present

## 2017-05-01 DIAGNOSIS — G894 Chronic pain syndrome: Secondary | ICD-10-CM | POA: Diagnosis not present

## 2017-05-01 DIAGNOSIS — M549 Dorsalgia, unspecified: Secondary | ICD-10-CM | POA: Diagnosis not present

## 2017-05-01 DIAGNOSIS — Z79899 Other long term (current) drug therapy: Secondary | ICD-10-CM | POA: Diagnosis not present

## 2017-05-01 DIAGNOSIS — G5600 Carpal tunnel syndrome, unspecified upper limb: Secondary | ICD-10-CM | POA: Diagnosis not present

## 2017-05-01 DIAGNOSIS — Z79891 Long term (current) use of opiate analgesic: Secondary | ICD-10-CM | POA: Diagnosis not present

## 2017-05-01 DIAGNOSIS — M1990 Unspecified osteoarthritis, unspecified site: Secondary | ICD-10-CM | POA: Diagnosis not present

## 2017-05-12 ENCOUNTER — Other Ambulatory Visit: Payer: Self-pay | Admitting: Physician Assistant

## 2017-05-12 ENCOUNTER — Ambulatory Visit
Admission: RE | Admit: 2017-05-12 | Discharge: 2017-05-12 | Disposition: A | Discharge status: Home / Self Care | Payer: Medicare HMO | Source: Ambulatory Visit | Attending: Physician Assistant | Admitting: Physician Assistant

## 2017-05-12 DIAGNOSIS — M545 Low back pain: Secondary | ICD-10-CM

## 2017-05-12 DIAGNOSIS — M4186 Other forms of scoliosis, lumbar region: Secondary | ICD-10-CM | POA: Diagnosis not present

## 2017-05-22 ENCOUNTER — Telehealth: Payer: Self-pay

## 2017-05-22 DIAGNOSIS — I5032 Chronic diastolic (congestive) heart failure: Secondary | ICD-10-CM

## 2017-05-22 DIAGNOSIS — G5601 Carpal tunnel syndrome, right upper limb: Secondary | ICD-10-CM | POA: Diagnosis not present

## 2017-05-22 NOTE — Telephone Encounter (Signed)
Updated pt orders for ECHO 2019 as 2018 order expired

## 2017-05-25 ENCOUNTER — Other Ambulatory Visit: Payer: Self-pay

## 2017-05-25 ENCOUNTER — Ambulatory Visit (HOSPITAL_COMMUNITY): Payer: Medicare HMO | Attending: Cardiovascular Disease

## 2017-05-25 DIAGNOSIS — E785 Hyperlipidemia, unspecified: Secondary | ICD-10-CM | POA: Insufficient documentation

## 2017-05-25 DIAGNOSIS — N189 Chronic kidney disease, unspecified: Secondary | ICD-10-CM | POA: Diagnosis not present

## 2017-05-25 DIAGNOSIS — E1122 Type 2 diabetes mellitus with diabetic chronic kidney disease: Secondary | ICD-10-CM | POA: Diagnosis not present

## 2017-05-25 DIAGNOSIS — I5032 Chronic diastolic (congestive) heart failure: Secondary | ICD-10-CM | POA: Diagnosis not present

## 2017-05-25 DIAGNOSIS — E039 Hypothyroidism, unspecified: Secondary | ICD-10-CM | POA: Insufficient documentation

## 2017-05-25 DIAGNOSIS — I130 Hypertensive heart and chronic kidney disease with heart failure and stage 1 through stage 4 chronic kidney disease, or unspecified chronic kidney disease: Secondary | ICD-10-CM | POA: Insufficient documentation

## 2017-05-26 ENCOUNTER — Encounter: Payer: Self-pay | Admitting: Cardiology

## 2017-05-28 ENCOUNTER — Telehealth: Payer: Self-pay

## 2017-05-28 DIAGNOSIS — I340 Nonrheumatic mitral (valve) insufficiency: Secondary | ICD-10-CM

## 2017-05-28 NOTE — Telephone Encounter (Signed)
Notes recorded by Teressa Senter, RN on 05/28/2017 at 12:21 PM EDT Patient notified of echo results and recommendations to repeat echo in a year for progression of MR. Patient in agreement with plan and thankful for the call.  Notes recorded by Sueanne Margarita, MD on 05/26/2017 at 11:04 AM EDT Please let patient know the 2D echocardiogram showed normal LV function with EF 60 to 65% with mild to moderate leakiness in the mitral valve. Please repeat 2D echocardiogram in 1 year for progression of MR

## 2017-05-29 DIAGNOSIS — M79641 Pain in right hand: Secondary | ICD-10-CM | POA: Insufficient documentation

## 2017-06-01 DIAGNOSIS — H52223 Regular astigmatism, bilateral: Secondary | ICD-10-CM | POA: Diagnosis not present

## 2017-06-01 DIAGNOSIS — H5213 Myopia, bilateral: Secondary | ICD-10-CM | POA: Diagnosis not present

## 2017-06-01 DIAGNOSIS — H524 Presbyopia: Secondary | ICD-10-CM | POA: Diagnosis not present

## 2017-06-04 DIAGNOSIS — M549 Dorsalgia, unspecified: Secondary | ICD-10-CM | POA: Diagnosis not present

## 2017-06-04 DIAGNOSIS — G894 Chronic pain syndrome: Secondary | ICD-10-CM | POA: Diagnosis not present

## 2017-06-04 DIAGNOSIS — G5600 Carpal tunnel syndrome, unspecified upper limb: Secondary | ICD-10-CM | POA: Diagnosis not present

## 2017-06-04 DIAGNOSIS — M1990 Unspecified osteoarthritis, unspecified site: Secondary | ICD-10-CM | POA: Diagnosis not present

## 2017-06-05 DIAGNOSIS — M79642 Pain in left hand: Secondary | ICD-10-CM | POA: Diagnosis not present

## 2017-06-09 DIAGNOSIS — M79641 Pain in right hand: Secondary | ICD-10-CM | POA: Diagnosis not present

## 2017-07-09 DIAGNOSIS — G5600 Carpal tunnel syndrome, unspecified upper limb: Secondary | ICD-10-CM | POA: Diagnosis not present

## 2017-07-09 DIAGNOSIS — M549 Dorsalgia, unspecified: Secondary | ICD-10-CM | POA: Diagnosis not present

## 2017-07-09 DIAGNOSIS — Z79899 Other long term (current) drug therapy: Secondary | ICD-10-CM | POA: Diagnosis not present

## 2017-07-09 DIAGNOSIS — Z79891 Long term (current) use of opiate analgesic: Secondary | ICD-10-CM | POA: Diagnosis not present

## 2017-07-09 DIAGNOSIS — G894 Chronic pain syndrome: Secondary | ICD-10-CM | POA: Diagnosis not present

## 2017-07-09 DIAGNOSIS — M1990 Unspecified osteoarthritis, unspecified site: Secondary | ICD-10-CM | POA: Diagnosis not present

## 2017-07-21 ENCOUNTER — Encounter: Payer: Self-pay | Admitting: Cardiology

## 2017-07-21 ENCOUNTER — Ambulatory Visit: Payer: Medicare HMO | Admitting: Cardiology

## 2017-07-21 VITALS — BP 122/61 | HR 81 | Ht 64.0 in | Wt 113.8 lb

## 2017-07-21 DIAGNOSIS — I5032 Chronic diastolic (congestive) heart failure: Secondary | ICD-10-CM | POA: Diagnosis not present

## 2017-07-21 DIAGNOSIS — I10 Essential (primary) hypertension: Secondary | ICD-10-CM

## 2017-07-21 DIAGNOSIS — E785 Hyperlipidemia, unspecified: Secondary | ICD-10-CM

## 2017-07-21 DIAGNOSIS — I2510 Atherosclerotic heart disease of native coronary artery without angina pectoris: Secondary | ICD-10-CM

## 2017-07-21 DIAGNOSIS — I340 Nonrheumatic mitral (valve) insufficiency: Secondary | ICD-10-CM

## 2017-07-21 NOTE — Patient Instructions (Signed)
Medication Instructions:  Your physician recommends that you continue on your current medications as directed. Please refer to the Current Medication list given to you today.  If you need a refill on your cardiac medications, please contact your pharmacy first.  Labwork: Today for kidney function test (BMET)  Testing/Procedures: None ordered   Follow-Up: Your physician wants you to follow-up in: 1 year with Dr. Radford Pax. You will receive a reminder letter in the mail two months in advance. If you don't receive a letter, please call our office to schedule the follow-up appointment.  Any Other Special Instructions Will Be Listed Below (If Applicable).   Thank you for choosing [facility], RN  [phone]  If you need a refill on your cardiac medications before your next appointment, please call your pharmacy.

## 2017-07-21 NOTE — Progress Notes (Signed)
Cardiology Office Note:    Date:  07/21/2017   ID:  Tammie West, DOB 1932-04-01, MRN [id number]  PCP:  [name], MD  Cardiologist:  Fransico Him, MD    Referring MD: [name],*   Chief Complaint  Patient presents with  . Coronary Artery Disease  . Hypertension  . Hyperlipidemia    History of Present Illness:    Tammie West is a 84 y.o. female with a hx of  HTN, nonobstructive ASCAD with Prox RCA to Mid RCA lesion, 45% stenosed and Ost LAD to Prox LAD lesion, 30% stenosed by cath and high calcium score by coronary CTA. She also has dyslipidemia, chronic diastolic CHF, moderate MR by echo 04/2016 and chronic LE edemaand continues with tobacco.  She is here today for followup and is doing well.  She denies any chest pain or pressure, SOB, DOE, PND, orthopnea, LE edema, dizziness, palpitations or syncope. She is compliant with her meds and is tolerating meds with no SE.    Past Medical History:  Diagnosis Date  . Bilateral carpal tunnel syndrome 02/17/2017  . Carpal tunnel syndrome   . CKD (chronic kidney disease) stage 4, GFR 15-29 ml/min (HCC)   . Coronary artery disease 11/2014    cath with Prox RCA to Mid RCA lesion, 45% stenosed and Ost LAD to Prox LAD lesion, 30% stenosed byt recent cath and high calcium score by coronary CTA.  . Crohn's disease ([place])   . Depression   . Diabetes mellitus without complication ([place])   . GERD (gastroesophageal reflux disease)   . Goiter   . Hyperlipidemia   . Hypertension   . Hypothyroidism   . Insomnia   . Migraine   . Mitral regurgitation    mild to moderate by echo 04/2017  . Osteoarthritis   . Osteoporosis   . Trochanteric bursitis     Past Surgical History:  Procedure Laterality Date  . ABDOMINAL HYSTERECTOMY    . APPENDECTOMY    . CARDIAC CATHETERIZATION N/A 12/13/2014   Procedure: Left Heart Cath and Coronary Angiography;  Surgeon: Belva Crome, MD;  Location: [facility];   Service: Cardiovascular;  Laterality: N/A;    Current Medications: Current Meds  Medication Sig  . amitriptyline (ELAVIL) 25 MG tablet Take 25 mg by mouth at bedtime.  Marland Kitchen amLODipine (NORVASC) 5 MG tablet Take 5 mg by mouth daily.  Marland Kitchen aspirin EC 81 MG tablet Take 1 tablet (81 mg total) by mouth daily.  . clidinium-chlordiazePOXIDE (LIBRAX) 5-2.5 MG capsule Take 1 capsule by mouth 2 (two) times daily.  . EMBEDA 20-0.8 MG CPCR   . escitalopram (LEXAPRO) 10 MG tablet Take 10 mg by mouth daily.  . furosemide (LASIX) 40 MG tablet Take 40 mg by mouth.  . insulin glargine (LANTUS) 100 unit/mL SOPN Inject 6 Units into the skin daily after breakfast.   . levothyroxine (SYNTHROID, LEVOTHROID) 75 MCG tablet Take 88 mcg by mouth daily before breakfast.   . mesalamine (LIALDA) 1.2 G EC tablet Take 2.4 g by mouth daily after breakfast.  . nitroGLYCERIN (NITROSTAT) 0.4 MG SL tablet Place 1 tablet (0.4 mg total) under the tongue every 5 (five) minutes as needed for chest pain.  . Oxycodone HCl 10 MG TABS Take 10 mg by mouth 2 (two) times daily.   . polyethylene glycol (MIRALAX / GLYCOLAX) packet Take 17 g by mouth daily as needed for mild constipation.  . Probiotic Product (ALIGN PO) Take 1 capsule by mouth  daily after breakfast.  . ranitidine (ZANTAC) 150 MG capsule Take 1 capsule by mouth daily.  . simvastatin (ZOCOR) 10 MG tablet Take 10 mg by mouth at bedtime.   . [DISCONTINUED] Morphine-Naltrexone (EMBEDA) 30-1.2 MG CPCR Take 1 capsule by mouth daily.      Allergies:   Atorvastatin; Byetta 10 mcg pen [exenatide]; Cefaclor; Codeine; Crestor [rosuvastatin]; Dexilant [dexlansoprazole]; Erythromycin; Fosamax [alendronate sodium]; Macrobid [nitrofurantoin monohyd macro]; Metformin and related; Nsaids; Penicillins; and Shellfish allergy   Social History   Socioeconomic History  . Marital status: Married    Spouse name: Not on file  . Number of children: Not on file  . Years of education: Not on file    . Highest education level: Not on file  Occupational History  . Not on file  Social Needs  . Financial resource strain: Not on file  . Food insecurity:    Worry: Not on file    Inability: Not on file  . Transportation needs:    Medical: Not on file    Non-medical: Not on file  Tobacco Use  . Smoking status: Current Some Day Smoker    Packs/day: 0.25    Types: Cigarettes  . Smokeless tobacco: Never Used  Substance and Sexual Activity  . Alcohol use: No  . Drug use: No  . Sexual activity: Not on file  Lifestyle  . Physical activity:    Days per week: Not on file    Minutes per session: Not on file  . Stress: Not on file  Relationships  . Social connections:    Talks on phone: Not on file    Gets together: Not on file    Attends religious service: Not on file    Active member of club or organization: Not on file    Attends meetings of clubs or organizations: Not on file    Relationship status: Not on file  Other Topics Concern  . Not on file  Social History Narrative  . Not on file     Family History: The patient's family history includes CAD in her brother; Emphysema in her father; Heart disease in her brother. There is no history of Heart attack or Stroke.  ROS:   Please see the history of present illness.    ROS  All other systems reviewed and negative.   EKGs/Labs/Other Studies Reviewed:    The following studies were reviewed today: none  EKG:  EKG is  ordered today.  The ekg ordered today demonstrates normal sinus rhythm 81 bpm with incomplete right bundle branch block.  Recent Labs: 01/28/2017: ALT 10; BUN 20; Creatinine, Ser 1.11; Potassium 3.7; Sodium 144   Recent Lipid Panel    Component Value Date/Time   CHOL 104 01/28/2017 0930   TRIG 101 01/28/2017 0930   HDL 52 01/28/2017 0930   CHOLHDL 2.0 01/28/2017 0930   CHOLHDL 2.0 01/08/2016 0941   VLDL 18 01/08/2016 0941   LDLCALC 32 01/28/2017 0930    Physical Exam:    VS:  BP 122/61   Pulse 81    Ht 5\' 4"  (1.626 m)   Wt 113 lb 12.8 oz (51.6 kg)   BMI 19.53 kg/m     Wt Readings from Last 3 Encounters:  07/21/17 113 lb 12.8 oz (51.6 kg)  01/19/17 122 lb 6.4 oz (55.5 kg)  07/18/16 126 lb 1.9 oz (57.2 kg)     GEN:  Well nourished, well developed in no acute distress HEENT: Normal NECK: No JVD; No carotid  bruits LYMPHATICS: No lymphadenopathy CARDIAC: RRR, no murmurs, rubs, gallops RESPIRATORY:  Clear to auscultation without rales, wheezing or rhonchi  ABDOMEN: Soft, non-tender, non-distended MUSCULOSKELETAL:  No edema; No deformity  SKIN: Warm and dry NEUROLOGIC:  Alert and oriented x 3 PSYCHIATRIC:  Normal affect   ASSESSMENT:    1. Coronary artery disease involving native coronary artery of native heart without angina pectoris   2. Essential hypertension   3. Chronic diastolic CHF (congestive heart failure) ([place])   4. Non-rheumatic mitral regurgitation   5. Hyperlipidemia with target LDL less than 70    PLAN:    In order of problems listed above:  1.  ASCAD -  Cath in 2016 showed nonobstructive ASCAD with Prox RCA to Mid RCA lesion, 45% stenosed and Ost LAD to Prox LAD lesion, 30% stenosed and high calcium score by coronary CTA.  She denies any anginal symptoms.  She will continue on aspirin 81 mg daily and statin.  2.  HTN - BP is well controlled on exam today.  She will continue on amlodipine 5 mg daily.  3.  Chronic diastolic CHF -she appears euvolemic on exam today her weight is stable.  Her Lasix was increased early in the year due to increased lower extremity edema.  This is significantly improved after increasing her Lasix to 40 mg daily and adding compression hose.  She will continue on Lasix 40 mg daily.  Her creatinine was 1.11 on 01/2017 and potassium is 3.7.  She would like her kidney function rechecked today so we will check a bmet.  4.  MR -mild to moderate by echo 05/25/2017.  Repeat echo in 1 year.   5.  Hyperlipidemia with LDL goal < 70.  Her LDL was  at goal at 32 on 01/28/2017.  ALT was normal at 10.  She will continue on simvastatin 10 mg daily.   Medication Adjustments/Labs and Tests Ordered: Current medicines are reviewed at length with the patient today.  Concerns regarding medicines are outlined above.  No orders of the defined types were placed in this encounter.  No orders of the defined types were placed in this encounter.   Signed, Fransico Him, MD  07/21/2017 1:02 PM    [place]

## 2017-07-22 DIAGNOSIS — Z79899 Other long term (current) drug therapy: Secondary | ICD-10-CM | POA: Diagnosis not present

## 2017-07-22 DIAGNOSIS — R69 Illness, unspecified: Secondary | ICD-10-CM | POA: Diagnosis not present

## 2017-07-22 DIAGNOSIS — E039 Hypothyroidism, unspecified: Secondary | ICD-10-CM | POA: Diagnosis not present

## 2017-07-22 DIAGNOSIS — B351 Tinea unguium: Secondary | ICD-10-CM | POA: Diagnosis not present

## 2017-07-22 DIAGNOSIS — R609 Edema, unspecified: Secondary | ICD-10-CM | POA: Diagnosis not present

## 2017-07-22 LAB — BASIC METABOLIC PANEL
BUN/Creatinine Ratio: 15 (ref 12–28)
BUN: 21 mg/dL (ref 8–27)
CALCIUM: 10 mg/dL (ref 8.7–10.3)
CO2: 28 mmol/L (ref 20–29)
Chloride: 96 mmol/L (ref 96–106)
Creatinine, Ser: 1.36 mg/dL — ABNORMAL HIGH (ref 0.57–1.00)
GFR calc Af Amer: 42 mL/min/{1.73_m2} — ABNORMAL LOW (ref >59)
GFR, EST NON AFRICAN AMERICAN: 36 mL/min/{1.73_m2} — AB (ref >59)
Glucose: 155 mg/dL — ABNORMAL HIGH (ref 65–99)
POTASSIUM: 4.3 mmol/L (ref 3.5–5.2)
Sodium: 139 mmol/L (ref 134–144)

## 2017-07-24 ENCOUNTER — Telehealth: Payer: Self-pay | Admitting: Cardiology

## 2017-07-24 DIAGNOSIS — Z79899 Other long term (current) drug therapy: Secondary | ICD-10-CM

## 2017-07-24 NOTE — Telephone Encounter (Signed)
Pt's daughter calling and wanting to know if her mom Tammie West  can decrease her Lasix 40 mg to 20 mg . Her primary doctor wanted Dr Radford Pax to decide if she should come down on her Lasix because of the results of her lab work. Please advise

## 2017-07-24 NOTE — Telephone Encounter (Signed)
Left message to call back  

## 2017-07-27 MED ORDER — FUROSEMIDE 40 MG PO TABS: ORAL_TABLET | ORAL | 3 refills | Status: DC

## 2017-07-27 NOTE — Telephone Encounter (Signed)
Lets try Lasix 40mg  qod alternating with 20mg  QOD and repeat BMET on Friday

## 2017-07-27 NOTE — Telephone Encounter (Signed)
I spoke with patient's daughter Dietrich Pates and instructed her that Dr. Radford Pax would like to decrease Lasix to 40 mg every other day and then 20 mg every other day and repeat BMET on Friday. She stated understanding and thankful for the call. Pt scheduled for bmet on 07/31/17.

## 2017-07-31 ENCOUNTER — Other Ambulatory Visit: Payer: Medicare HMO | Admitting: *Deleted

## 2017-07-31 DIAGNOSIS — Z79899 Other long term (current) drug therapy: Secondary | ICD-10-CM

## 2017-08-01 LAB — BASIC METABOLIC PANEL
BUN/Creatinine Ratio: 18 (ref 12–28)
BUN: 23 mg/dL (ref 8–27)
CO2: 26 mmol/L (ref 20–29)
Calcium: 9.6 mg/dL (ref 8.7–10.3)
Chloride: 98 mmol/L (ref 96–106)
Creatinine, Ser: 1.25 mg/dL — ABNORMAL HIGH (ref 0.57–1.00)
GFR calc Af Amer: 46 mL/min/{1.73_m2} — ABNORMAL LOW (ref >59)
GFR calc non Af Amer: 40 mL/min/{1.73_m2} — ABNORMAL LOW (ref >59)
GLUCOSE: 166 mg/dL — AB (ref 65–99)
POTASSIUM: 4.6 mmol/L (ref 3.5–5.2)
SODIUM: 138 mmol/L (ref 134–144)

## 2017-08-07 DIAGNOSIS — Z7984 Long term (current) use of oral hypoglycemic drugs: Secondary | ICD-10-CM | POA: Diagnosis not present

## 2017-08-07 DIAGNOSIS — G5601 Carpal tunnel syndrome, right upper limb: Secondary | ICD-10-CM | POA: Diagnosis not present

## 2017-08-07 DIAGNOSIS — E039 Hypothyroidism, unspecified: Secondary | ICD-10-CM | POA: Diagnosis not present

## 2017-08-07 DIAGNOSIS — E785 Hyperlipidemia, unspecified: Secondary | ICD-10-CM | POA: Diagnosis not present

## 2017-08-07 DIAGNOSIS — M19049 Primary osteoarthritis, unspecified hand: Secondary | ICD-10-CM | POA: Diagnosis not present

## 2017-08-07 DIAGNOSIS — E1165 Type 2 diabetes mellitus with hyperglycemia: Secondary | ICD-10-CM | POA: Diagnosis not present

## 2017-08-07 DIAGNOSIS — E049 Nontoxic goiter, unspecified: Secondary | ICD-10-CM | POA: Diagnosis not present

## 2017-08-07 DIAGNOSIS — I509 Heart failure, unspecified: Secondary | ICD-10-CM | POA: Diagnosis not present

## 2017-08-07 DIAGNOSIS — M810 Age-related osteoporosis without current pathological fracture: Secondary | ICD-10-CM | POA: Diagnosis not present

## 2017-08-07 DIAGNOSIS — R69 Illness, unspecified: Secondary | ICD-10-CM | POA: Diagnosis not present

## 2017-08-07 DIAGNOSIS — Z794 Long term (current) use of insulin: Secondary | ICD-10-CM | POA: Diagnosis not present

## 2017-08-07 DIAGNOSIS — E119 Type 2 diabetes mellitus without complications: Secondary | ICD-10-CM | POA: Diagnosis not present

## 2017-08-07 DIAGNOSIS — I10 Essential (primary) hypertension: Secondary | ICD-10-CM | POA: Diagnosis not present

## 2017-08-10 DIAGNOSIS — G894 Chronic pain syndrome: Secondary | ICD-10-CM | POA: Diagnosis not present

## 2017-08-10 DIAGNOSIS — M549 Dorsalgia, unspecified: Secondary | ICD-10-CM | POA: Diagnosis not present

## 2017-08-10 DIAGNOSIS — M1990 Unspecified osteoarthritis, unspecified site: Secondary | ICD-10-CM | POA: Diagnosis not present

## 2017-08-14 ENCOUNTER — Ambulatory Visit: Payer: Medicare HMO | Admitting: Podiatry

## 2017-08-14 ENCOUNTER — Encounter: Payer: Self-pay | Admitting: Podiatry

## 2017-08-14 VITALS — BP 123/62 | HR 75 | Resp 16

## 2017-08-14 DIAGNOSIS — E1151 Type 2 diabetes mellitus with diabetic peripheral angiopathy without gangrene: Secondary | ICD-10-CM

## 2017-08-14 DIAGNOSIS — L600 Ingrowing nail: Secondary | ICD-10-CM | POA: Diagnosis not present

## 2017-08-14 DIAGNOSIS — B351 Tinea unguium: Secondary | ICD-10-CM

## 2017-08-14 NOTE — Patient Instructions (Signed)

## 2017-08-16 NOTE — Progress Notes (Signed)
  Subjective:  Patient ID: Tammie West, female    DOB: 09/06/1932,  MRN: [id number]  Chief Complaint  Patient presents with  . Nail Problem    Bilateral; great toes-medial side; pt stated, "Has put betadine on toes and it has helped"; x6 months; Pt Diabetic 2; Sugar=138 this am; A1C=7.8 (pt stated, not in chart)   84 y.o. female returns for diabetic foot care.  Complains of ingrown nails to both great toes.  On the inside border. Last AMBS was 138 this a.m.  Last A1c 7.8.Marland Kitchen Denies numbness and tingling in their feet. Denies cramping in legs and thighs.  Objective:   Vitals:   08/14/17 1355  BP: 123/62  Pulse: 75  Resp: 16   General AA&O x3. Normal mood and affect.  Vascular Dorsalis pedis pulses present 1+ bilaterally  Posterior tibial pulses absent bilaterally  Capillary refill normal to all digits. Pedal hair growth absent. Varicosities present bilateral  Neurologic Epicritic sensation present bilaterally. Protective sensation with 5.07 monofilament  present bilaterally. Vibratory sensation present bilaterally.  Dermatologic No open lesions. Interspaces clear of maceration.  Normal skin temperature and turgor. Hyperkeratotic lesions: none bilaterally. Nails: Dystrophic thickened.  Ingrowing at medial aspects of both great toenails  Orthopedic: No history of amputation. MMT 5/5 in dorsiflexion, plantarflexion, inversion, and eversion. Normal lower extremity joint ROM without pain or crepitus.   Assessment & Plan:  Patient was evaluated and treated and all questions answered.  Diabetes with PAD, Onychomycosis -Educated on diabetic footcare. Diabetic risk level 1 -At risk foot care provided as below.  Procedure: Nail Debridement Rationale: Patient meets criteria for routine foot care due to Class B findings. Type of Debridement: manual, sharp debridement. Instrumentation: Nail nipper, rotary burr. Number of Nails: 10  Follow-up in 3 months for routine foot  care

## 2017-09-07 ENCOUNTER — Other Ambulatory Visit: Payer: Self-pay | Admitting: Physician Assistant

## 2017-09-07 ENCOUNTER — Ambulatory Visit
Admission: RE | Admit: 2017-09-07 | Discharge: 2017-09-07 | Disposition: A | Discharge status: Home / Self Care | Payer: Medicare HMO | Source: Ambulatory Visit | Attending: Physician Assistant | Admitting: Physician Assistant

## 2017-09-07 DIAGNOSIS — M25511 Pain in right shoulder: Secondary | ICD-10-CM

## 2017-09-07 DIAGNOSIS — M549 Dorsalgia, unspecified: Secondary | ICD-10-CM | POA: Diagnosis not present

## 2017-09-07 DIAGNOSIS — G894 Chronic pain syndrome: Secondary | ICD-10-CM | POA: Diagnosis not present

## 2017-09-07 DIAGNOSIS — M1990 Unspecified osteoarthritis, unspecified site: Secondary | ICD-10-CM | POA: Diagnosis not present

## 2017-09-07 DIAGNOSIS — M19011 Primary osteoarthritis, right shoulder: Secondary | ICD-10-CM | POA: Diagnosis not present

## 2017-09-14 DIAGNOSIS — R69 Illness, unspecified: Secondary | ICD-10-CM | POA: Diagnosis not present

## 2017-09-17 DIAGNOSIS — M25511 Pain in right shoulder: Secondary | ICD-10-CM | POA: Diagnosis not present

## 2017-09-17 DIAGNOSIS — M19011 Primary osteoarthritis, right shoulder: Secondary | ICD-10-CM | POA: Diagnosis not present

## 2017-09-17 DIAGNOSIS — G894 Chronic pain syndrome: Secondary | ICD-10-CM | POA: Diagnosis not present

## 2017-09-25 DIAGNOSIS — M19049 Primary osteoarthritis, unspecified hand: Secondary | ICD-10-CM | POA: Diagnosis not present

## 2017-09-25 DIAGNOSIS — E039 Hypothyroidism, unspecified: Secondary | ICD-10-CM | POA: Diagnosis not present

## 2017-09-25 DIAGNOSIS — M1990 Unspecified osteoarthritis, unspecified site: Secondary | ICD-10-CM | POA: Diagnosis not present

## 2017-09-25 DIAGNOSIS — E1165 Type 2 diabetes mellitus with hyperglycemia: Secondary | ICD-10-CM | POA: Diagnosis not present

## 2017-09-25 DIAGNOSIS — E785 Hyperlipidemia, unspecified: Secondary | ICD-10-CM | POA: Diagnosis not present

## 2017-09-25 DIAGNOSIS — I509 Heart failure, unspecified: Secondary | ICD-10-CM | POA: Diagnosis not present

## 2017-09-25 DIAGNOSIS — I10 Essential (primary) hypertension: Secondary | ICD-10-CM | POA: Diagnosis not present

## 2017-09-25 DIAGNOSIS — Z794 Long term (current) use of insulin: Secondary | ICD-10-CM | POA: Diagnosis not present

## 2017-09-25 DIAGNOSIS — M810 Age-related osteoporosis without current pathological fracture: Secondary | ICD-10-CM | POA: Diagnosis not present

## 2017-09-25 DIAGNOSIS — R69 Illness, unspecified: Secondary | ICD-10-CM | POA: Diagnosis not present

## 2017-10-05 DIAGNOSIS — Z79891 Long term (current) use of opiate analgesic: Secondary | ICD-10-CM | POA: Diagnosis not present

## 2017-10-05 DIAGNOSIS — M25511 Pain in right shoulder: Secondary | ICD-10-CM | POA: Diagnosis not present

## 2017-10-05 DIAGNOSIS — Z79899 Other long term (current) drug therapy: Secondary | ICD-10-CM | POA: Diagnosis not present

## 2017-10-05 DIAGNOSIS — M19011 Primary osteoarthritis, right shoulder: Secondary | ICD-10-CM | POA: Diagnosis not present

## 2017-10-05 DIAGNOSIS — M549 Dorsalgia, unspecified: Secondary | ICD-10-CM | POA: Diagnosis not present

## 2017-10-05 DIAGNOSIS — G894 Chronic pain syndrome: Secondary | ICD-10-CM | POA: Diagnosis not present

## 2017-10-23 DIAGNOSIS — M19049 Primary osteoarthritis, unspecified hand: Secondary | ICD-10-CM | POA: Diagnosis not present

## 2017-10-23 DIAGNOSIS — N183 Chronic kidney disease, stage 3 (moderate): Secondary | ICD-10-CM | POA: Diagnosis not present

## 2017-10-23 DIAGNOSIS — Z23 Encounter for immunization: Secondary | ICD-10-CM | POA: Diagnosis not present

## 2017-10-23 DIAGNOSIS — K5090 Crohn's disease, unspecified, without complications: Secondary | ICD-10-CM | POA: Diagnosis not present

## 2017-10-23 DIAGNOSIS — I10 Essential (primary) hypertension: Secondary | ICD-10-CM | POA: Diagnosis not present

## 2017-10-23 DIAGNOSIS — Z79899 Other long term (current) drug therapy: Secondary | ICD-10-CM | POA: Diagnosis not present

## 2017-10-23 DIAGNOSIS — M810 Age-related osteoporosis without current pathological fracture: Secondary | ICD-10-CM | POA: Diagnosis not present

## 2017-10-23 DIAGNOSIS — E039 Hypothyroidism, unspecified: Secondary | ICD-10-CM | POA: Diagnosis not present

## 2017-10-23 DIAGNOSIS — Z1389 Encounter for screening for other disorder: Secondary | ICD-10-CM | POA: Diagnosis not present

## 2017-10-23 DIAGNOSIS — R601 Generalized edema: Secondary | ICD-10-CM | POA: Diagnosis not present

## 2017-10-23 DIAGNOSIS — E785 Hyperlipidemia, unspecified: Secondary | ICD-10-CM | POA: Diagnosis not present

## 2017-10-23 DIAGNOSIS — Z0000 Encounter for general adult medical examination without abnormal findings: Secondary | ICD-10-CM | POA: Diagnosis not present

## 2017-10-23 DIAGNOSIS — G894 Chronic pain syndrome: Secondary | ICD-10-CM | POA: Diagnosis not present

## 2017-10-23 DIAGNOSIS — E1169 Type 2 diabetes mellitus with other specified complication: Secondary | ICD-10-CM | POA: Diagnosis not present

## 2017-10-23 DIAGNOSIS — I509 Heart failure, unspecified: Secondary | ICD-10-CM | POA: Diagnosis not present

## 2017-10-23 DIAGNOSIS — E1165 Type 2 diabetes mellitus with hyperglycemia: Secondary | ICD-10-CM | POA: Diagnosis not present

## 2017-10-23 DIAGNOSIS — R69 Illness, unspecified: Secondary | ICD-10-CM | POA: Diagnosis not present

## 2017-10-23 DIAGNOSIS — E1122 Type 2 diabetes mellitus with diabetic chronic kidney disease: Secondary | ICD-10-CM | POA: Diagnosis not present

## 2017-11-11 DIAGNOSIS — Z79891 Long term (current) use of opiate analgesic: Secondary | ICD-10-CM | POA: Diagnosis not present

## 2017-11-11 DIAGNOSIS — M1990 Unspecified osteoarthritis, unspecified site: Secondary | ICD-10-CM | POA: Diagnosis not present

## 2017-11-11 DIAGNOSIS — M25511 Pain in right shoulder: Secondary | ICD-10-CM | POA: Diagnosis not present

## 2017-11-11 DIAGNOSIS — M549 Dorsalgia, unspecified: Secondary | ICD-10-CM | POA: Diagnosis not present

## 2017-11-11 DIAGNOSIS — Z79899 Other long term (current) drug therapy: Secondary | ICD-10-CM | POA: Diagnosis not present

## 2017-11-11 DIAGNOSIS — G894 Chronic pain syndrome: Secondary | ICD-10-CM | POA: Diagnosis not present

## 2017-11-13 ENCOUNTER — Ambulatory Visit: Payer: Medicare HMO | Admitting: Podiatry

## 2017-11-13 ENCOUNTER — Encounter: Payer: Self-pay | Admitting: Podiatry

## 2017-11-13 DIAGNOSIS — E1151 Type 2 diabetes mellitus with diabetic peripheral angiopathy without gangrene: Secondary | ICD-10-CM

## 2017-11-13 DIAGNOSIS — Q828 Other specified congenital malformations of skin: Secondary | ICD-10-CM | POA: Diagnosis not present

## 2017-11-13 DIAGNOSIS — B351 Tinea unguium: Secondary | ICD-10-CM

## 2017-11-17 DIAGNOSIS — R69 Illness, unspecified: Secondary | ICD-10-CM | POA: Diagnosis not present

## 2017-11-25 DIAGNOSIS — R634 Abnormal weight loss: Secondary | ICD-10-CM | POA: Diagnosis not present

## 2017-11-25 DIAGNOSIS — R109 Unspecified abdominal pain: Secondary | ICD-10-CM | POA: Diagnosis not present

## 2017-11-25 DIAGNOSIS — K5900 Constipation, unspecified: Secondary | ICD-10-CM | POA: Diagnosis not present

## 2017-11-25 DIAGNOSIS — R682 Dry mouth, unspecified: Secondary | ICD-10-CM | POA: Diagnosis not present

## 2017-11-25 DIAGNOSIS — K219 Gastro-esophageal reflux disease without esophagitis: Secondary | ICD-10-CM | POA: Diagnosis not present

## 2017-11-25 DIAGNOSIS — R143 Flatulence: Secondary | ICD-10-CM | POA: Diagnosis not present

## 2017-11-25 DIAGNOSIS — R69 Illness, unspecified: Secondary | ICD-10-CM | POA: Diagnosis not present

## 2017-11-26 NOTE — Progress Notes (Signed)
  Subjective:  Patient ID: Tammie West, female    DOB: 25-Sep-1932,  MRN: [id number]  Chief Complaint  Patient presents with  . Debridement    Bilateral nail trim; pt Diabetic Type 2; Sugar=110 this am; A1C=pt stated, "7.5"-not in chart  . Callouses    Right foot; 5th toe-lateral side   84 y.o. female returns for diabetic foot care. History as above. Denies numbness and tingling in their feet. Denies cramping in legs and thighs.  Objective:   There were no vitals filed for this visit. General AA&O x3. Normal mood and affect.  Vascular Dorsalis pedis pulses present 1+ bilaterally  Posterior tibial pulses absent bilaterally  Capillary refill normal to all digits. Pedal hair growth absent. Varicosities present bilateral  Neurologic Epicritic sensation present bilaterally. Protective sensation with 5.07 monofilament  present bilaterally. Vibratory sensation present bilaterally.  Dermatologic No open lesions. Interspaces clear of maceration.  Normal skin temperature and turgor. Hyperkeratotic lesion right fifth toe. Nails: Dystrophic thickened.  Ingrowing at medial aspects of both great toenails  Orthopedic: No history of amputation. MMT 5/5 in dorsiflexion, plantarflexion, inversion, and eversion. Normal lower extremity joint ROM without pain or crepitus.   Assessment & Plan:  Patient was evaluated and treated and all questions answered.  Diabetes with PAD, Onychomycosis -Educated on diabetic footcare. Diabetic risk level 1 -At risk foot care provided as below.  Procedure: Nail Debridement Rationale: Patient meets criteria for routine foot care due to PAD Type of Debridement: manual, sharp debridement. Instrumentation: Nail nipper, rotary burr. Number of Nails: 10  Procedure: Paring of Lesion Rationale: painful hyperkeratotic lesion Type of Debridement: manual, sharp debridement. Instrumentation: 312 blade Number of Lesions: 1     Follow-up in 3 months for  routine foot care

## 2017-12-17 DIAGNOSIS — E1165 Type 2 diabetes mellitus with hyperglycemia: Secondary | ICD-10-CM | POA: Diagnosis not present

## 2017-12-17 DIAGNOSIS — M25511 Pain in right shoulder: Secondary | ICD-10-CM | POA: Diagnosis not present

## 2017-12-17 DIAGNOSIS — I10 Essential (primary) hypertension: Secondary | ICD-10-CM | POA: Diagnosis not present

## 2017-12-17 DIAGNOSIS — E119 Type 2 diabetes mellitus without complications: Secondary | ICD-10-CM | POA: Diagnosis not present

## 2017-12-17 DIAGNOSIS — N183 Chronic kidney disease, stage 3 (moderate): Secondary | ICD-10-CM | POA: Diagnosis not present

## 2017-12-17 DIAGNOSIS — R69 Illness, unspecified: Secondary | ICD-10-CM | POA: Diagnosis not present

## 2017-12-17 DIAGNOSIS — E039 Hypothyroidism, unspecified: Secondary | ICD-10-CM | POA: Diagnosis not present

## 2017-12-17 DIAGNOSIS — E785 Hyperlipidemia, unspecified: Secondary | ICD-10-CM | POA: Diagnosis not present

## 2017-12-17 DIAGNOSIS — Z79899 Other long term (current) drug therapy: Secondary | ICD-10-CM | POA: Diagnosis not present

## 2017-12-17 DIAGNOSIS — M810 Age-related osteoporosis without current pathological fracture: Secondary | ICD-10-CM | POA: Diagnosis not present

## 2017-12-17 DIAGNOSIS — E1122 Type 2 diabetes mellitus with diabetic chronic kidney disease: Secondary | ICD-10-CM | POA: Diagnosis not present

## 2017-12-17 DIAGNOSIS — M19011 Primary osteoarthritis, right shoulder: Secondary | ICD-10-CM | POA: Diagnosis not present

## 2017-12-17 DIAGNOSIS — M1990 Unspecified osteoarthritis, unspecified site: Secondary | ICD-10-CM | POA: Diagnosis not present

## 2017-12-17 DIAGNOSIS — Z79891 Long term (current) use of opiate analgesic: Secondary | ICD-10-CM | POA: Diagnosis not present

## 2017-12-17 DIAGNOSIS — G894 Chronic pain syndrome: Secondary | ICD-10-CM | POA: Diagnosis not present

## 2018-01-05 ENCOUNTER — Telehealth: Payer: Self-pay | Admitting: Podiatry

## 2018-01-05 NOTE — Telephone Encounter (Signed)
error 

## 2018-01-07 ENCOUNTER — Telehealth: Payer: Self-pay | Admitting: Podiatry

## 2018-01-07 NOTE — Telephone Encounter (Signed)
I called pt's dtr, [name] and told her if the toe or corn is swollen, and larger than before and pt is diabetic she would benefit from being seen in office, have pt wear a soft shoe, or wide shoe and if there is broken skin dress with a light dressing and neosporin. Transferred pt to scheduler.

## 2018-01-07 NOTE — Telephone Encounter (Signed)
Patient had a corn on her right little toe that is really painful, it is larger than it was before.  I was wondering what would you suggest to put on it. My Mother is diabetic and Im not sure what to do, Im not sure if I need to bring her in.

## 2018-01-08 ENCOUNTER — Ambulatory Visit: Payer: Medicare HMO | Admitting: Podiatry

## 2018-01-08 DIAGNOSIS — M79674 Pain in right toe(s): Secondary | ICD-10-CM

## 2018-01-08 DIAGNOSIS — Q828 Other specified congenital malformations of skin: Secondary | ICD-10-CM | POA: Diagnosis not present

## 2018-01-08 DIAGNOSIS — M79675 Pain in left toe(s): Secondary | ICD-10-CM | POA: Diagnosis not present

## 2018-01-08 DIAGNOSIS — E1151 Type 2 diabetes mellitus with diabetic peripheral angiopathy without gangrene: Secondary | ICD-10-CM | POA: Diagnosis not present

## 2018-01-08 DIAGNOSIS — B351 Tinea unguium: Secondary | ICD-10-CM | POA: Diagnosis not present

## 2018-01-08 DIAGNOSIS — M205X1 Other deformities of toe(s) (acquired), right foot: Secondary | ICD-10-CM

## 2018-01-08 NOTE — Patient Instructions (Addendum)
Corns and Calluses Corns are small areas of thickened skin that occur on the top, sides, or tip of a toe. They contain a cone-shaped core with a point that can press on a nerve below. This causes pain. Calluses are areas of thickened skin that can occur anywhere on the body including hands, fingers, palms, soles of the feet, and heels.Calluses are usually larger than corns. What are the causes? Corns and calluses are caused by rubbing (friction) or pressure, such as from shoes that are too tight or do not fit properly. What increases the risk? Corns are more likely to develop in people who have toe deformities, such as hammer toes. Since calluses can occur with friction to any area of the skin, calluses are more likely to develop in people who:  Work with their hands.  Wear shoes that fit poorly, shoes that are too tight, or shoes that are high-heeled.  Have toes deformities.  What are the signs or symptoms? Symptoms of a corn or callus include:  A hard growth on the skin.  Pain or tenderness under the skin.  Redness and swelling.  Increased discomfort while wearing tight-fitting shoes.  How is this diagnosed? Corns and calluses may be diagnosed with a medical history and physical exam. How is this treated? Corns and calluses may be treated with:  Removing the cause of the friction or pressure. This may include: ? Changing your shoes. ? Wearing shoe inserts (orthotics) or other protective layers in your shoes, such as a corn pad. ? Wearing gloves.  Medicines to help soften skin in the hardened, thickened areas.  Reducing the size of the corn or callus by removing the dead layers of skin.  Antibiotic medicines to treat infection.  Surgery, if a toe deformity is the cause.  Follow these instructions at home:  Take medicines only as directed by your health care provider.  If you were prescribed an antibiotic, finish all of it even if you start to feel better.  Wear  shoes that fit well. Avoid wearing high-heeled shoes and shoes that are too tight or too loose.  Wear any padding, protective layers, gloves, or orthotics as directed by your health care provider.  Soak your hands or feet and then use a file or pumice stone to soften your corn or callus. Do this as directed by your health care provider.  Check your corn or callus every day for signs of infection. Watch for: ? Redness, swelling, or pain. ? Fluid, blood, or pus. Contact a health care provider if:  Your symptoms do not improve with treatment.  You have increased redness, swelling, or pain at the site of your corn or callus.  You have fluid, blood, or pus coming from your corn or callus.  You have new symptoms. This information is not intended to replace advice given to you by your health care provider. Make sure you discuss any questions you have with your health care provider. Document Released: 10/20/2003 Document Revised: 08/03/2015 Document Reviewed: 01/09/2014 Elsevier Interactive Patient Education  2018 [employer].  Diabetes and Foot Care Diabetes may cause you to have problems because of poor blood supply (circulation) to your feet and legs. This may cause the skin on your feet to become thinner, break easier, and heal more slowly. Your skin may become dry, and the skin may peel and crack. You may also have nerve damage in your legs and feet causing decreased feeling in them. You may not notice minor injuries to your  feet that could lead to infections or more serious problems. Taking care of your feet is one of the most important things you can do for yourself. Follow these instructions at home:  Wear shoes at all times, even in the house. Do not go barefoot. Bare feet are easily injured.  Check your feet daily for blisters, cuts, and redness. If you cannot see the bottom of your feet, use a mirror or ask someone for help.  Wash your feet with warm water (do not use hot water)  and mild soap. Then pat your feet and the areas between your toes until they are completely dry. Do not soak your feet as this can dry your skin.  Apply a moisturizing lotion or petroleum jelly (that does not contain alcohol and is unscented) to the skin on your feet and to dry, brittle toenails. Do not apply lotion between your toes.  Trim your toenails straight across. Do not dig under them or around the cuticle. File the edges of your nails with an emery board or nail file.  Do not cut corns or calluses or try to remove them with medicine.  Wear clean socks or stockings every day. Make sure they are not too tight. Do not wear knee-high stockings since they may decrease blood flow to your legs.  Wear shoes that fit properly and have enough cushioning. To break in new shoes, wear them for just a few hours a day. This prevents you from injuring your feet. Always look in your shoes before you put them on to be sure there are no objects inside.  Do not cross your legs. This may decrease the blood flow to your feet.  If you find a minor scrape, cut, or break in the skin on your feet, keep it and the skin around it clean and dry. These areas may be cleansed with mild soap and water. Do not cleanse the area with peroxide, alcohol, or iodine.  When you remove an adhesive bandage, be sure not to damage the skin around it.  If you have a wound, look at it several times a day to make sure it is healing.  Do not use heating pads or hot water bottles. They may burn your skin. If you have lost feeling in your feet or legs, you may not know it is happening until it is too late.  Make sure your health care provider performs a complete foot exam at least annually or more often if you have foot problems. Report any cuts, sores, or bruises to your health care provider immediately. Contact a health care provider if:  You have an injury that is not healing.  You have cuts or breaks in the skin.  You have  an ingrown nail.  You notice redness on your legs or feet.  You feel burning or tingling in your legs or feet.  You have pain or cramps in your legs and feet.  Your legs or feet are numb.  Your feet always feel cold. Get help right away if:  There is increasing redness, swelling, or pain in or around a wound.  There is a red line that goes up your leg.  Pus is coming from a wound.  You develop a fever or as directed by your health care provider.  You notice a bad smell coming from an ulcer or wound. This information is not intended to replace advice given to you by your health care provider. Make sure you discuss any questions  you have with your health care provider. Document Released: 01/11/2000 Document Revised: 06/21/2015 Document Reviewed: 06/22/2012 Elsevier Interactive Patient Education  2017 [employer].

## 2018-01-21 DIAGNOSIS — M19011 Primary osteoarthritis, right shoulder: Secondary | ICD-10-CM | POA: Diagnosis not present

## 2018-01-28 DIAGNOSIS — E1165 Type 2 diabetes mellitus with hyperglycemia: Secondary | ICD-10-CM | POA: Diagnosis not present

## 2018-01-28 DIAGNOSIS — M1990 Unspecified osteoarthritis, unspecified site: Secondary | ICD-10-CM | POA: Diagnosis not present

## 2018-01-28 DIAGNOSIS — M810 Age-related osteoporosis without current pathological fracture: Secondary | ICD-10-CM | POA: Diagnosis not present

## 2018-01-28 DIAGNOSIS — E785 Hyperlipidemia, unspecified: Secondary | ICD-10-CM | POA: Diagnosis not present

## 2018-01-28 DIAGNOSIS — N183 Chronic kidney disease, stage 3 (moderate): Secondary | ICD-10-CM | POA: Diagnosis not present

## 2018-01-28 DIAGNOSIS — R69 Illness, unspecified: Secondary | ICD-10-CM | POA: Diagnosis not present

## 2018-01-28 DIAGNOSIS — E119 Type 2 diabetes mellitus without complications: Secondary | ICD-10-CM | POA: Diagnosis not present

## 2018-01-28 DIAGNOSIS — I10 Essential (primary) hypertension: Secondary | ICD-10-CM | POA: Diagnosis not present

## 2018-01-28 DIAGNOSIS — E1122 Type 2 diabetes mellitus with diabetic chronic kidney disease: Secondary | ICD-10-CM | POA: Diagnosis not present

## 2018-01-28 DIAGNOSIS — E039 Hypothyroidism, unspecified: Secondary | ICD-10-CM | POA: Diagnosis not present

## 2018-02-11 ENCOUNTER — Encounter: Payer: Self-pay | Admitting: Podiatry

## 2018-02-11 NOTE — Progress Notes (Signed)
Subjective: Tammie West is a 85 y.o. y.o. female, accompanied by her daughter on today.  She presents for preventative foot care today with h/o PAD and cc of painful right fifth digit.  Patient states that digit is very tender and that she cannot wear regular shoe without pain.  She has resorted to wearing house slippers for comfort.  She denies any drainage, redness or swelling of the digit.  [name], MD is her PCP.   Current Outpatient Medications:  .  amitriptyline (ELAVIL) 25 MG tablet, Take 25 mg by mouth at bedtime., Disp: , Rfl:  .  amLODipine (NORVASC) 5 MG tablet, Take 5 mg by mouth daily., Disp: , Rfl:  .  aspirin EC 81 MG tablet, Take 1 tablet (81 mg total) by mouth daily., Disp: , Rfl:  .  clidinium-chlordiazePOXIDE (LIBRAX) 5-2.5 MG capsule, Take 1 capsule by mouth 2 (two) times daily., Disp: , Rfl:  .  EMBEDA 20-0.8 MG CPCR, , Disp: , Rfl:  .  escitalopram (LEXAPRO) 10 MG tablet, Take 10 mg by mouth daily., Disp: , Rfl:  .  insulin glargine (LANTUS) 100 unit/mL SOPN, Inject 3 Units into the skin daily after breakfast. , Disp: , Rfl:  .  insulin lispro (HUMALOG KWIKPEN) 100 UNIT/ML KwikPen, Inject into the skin., Disp: , Rfl:  .  levothyroxine (SYNTHROID, LEVOTHROID) 75 MCG tablet, Take 88 mcg by mouth daily before breakfast. , Disp: , Rfl:  .  mesalamine (LIALDA) 1.2 G EC tablet, Take 2.4 g by mouth daily after breakfast., Disp: , Rfl:  .  nitroGLYCERIN (NITROSTAT) 0.4 MG SL tablet, Place 1 tablet (0.4 mg total) under the tongue every 5 (five) minutes as needed for chest pain., Disp: 25 tablet, Rfl: 3 .  Oxycodone HCl 10 MG TABS, Take 10 mg by mouth 2 (two) times daily. , Disp: , Rfl:  .  polyethylene glycol (MIRALAX / GLYCOLAX) packet, Take 17 g by mouth daily as needed for mild constipation., Disp: , Rfl:  .  Probiotic Product (ALIGN PO), Take 1 capsule by mouth daily after breakfast., Disp: , Rfl:  .  ranitidine (ZANTAC) 150 MG capsule, Take 1 capsule by  mouth daily., Disp: , Rfl:  .  simvastatin (ZOCOR) 10 MG tablet, Take 10 mg by mouth at bedtime. , Disp: , Rfl:  .  furosemide (LASIX) 40 MG tablet, Take 1 tablet (40 mg total) by mouth every other day AND 0.5 tablets (20 mg total) every other day., Disp: 45 tablet, Rfl: 3  Allergies  Allergen Reactions  . Atorvastatin Nausea And Vomiting  . Byetta 10 Mcg Pen [Exenatide] Nausea And Vomiting  . Cefaclor Other (See Comments)    Pt does not remember the reaction  . Codeine Nausea And Vomiting       . Crestor [Rosuvastatin] Nausea And Vomiting  . Dexilant [Dexlansoprazole] Diarrhea  . Erythromycin Hives and Swelling  . Fosamax [Alendronate Sodium] Other (See Comments)    GI intolerance  . Macrobid [employer] Macro] Other (See Comments)    Pt does not remember the reaction  . Metformin And Related Nausea And Vomiting  . Nsaids     Elevated creatinine   . Penicillins Hives and Swelling    Has patient had a PCN reaction causing immediate rash, facial/tongue/throat swelling, SOB or lightheadedness with hypotension: Yes Has patient had a PCN reaction causing severe rash involving mucus membranes or skin necrosis: No Has patient had a PCN reaction that required hospitalization No Has patient had a  PCN reaction occurring within the last 10 years: No If all of the above answers are "NO", then may proceed with Cephalosporin use.  . Tolmetin Other (See Comments)    Elevated creatinine   . Shellfish Allergy Nausea And Vomiting, Other (See Comments) and Rash    Felt spaced out Felt spaced out Felt spaced out    Objective: Vascular Examination: Capillary refill time immediate x 10 digits  Dorsalis pedis pulses 1/4 b/l  Posterior tibial pulses absent b/l  No digital hair x 10 digits  Skin temperature gradient within normal limits bilaterally  Varicosities present bilateral lower extremities  Dermatological Examination: Skin with normal turgor texture and tone  bilaterally  Toenails 1-5 b/l discolored, thick, dystrophic with subungual debris and pain with palpation to nailbeds due to thickness of nails.  She has a tender porokeratotic lesion noted at the distal lateral aspect of the right fifth digit.  There is exquisite tenderness to palpation of this lesion.  There is mild inflammation, no ischemia, no flocculence, no edema, no digital warmth, no abnormal coolness.   Musculoskeletal: Muscle strength 5/5 to all LE muscle groups   There is adductovarus deformity of the right fifth digit which causes the distal lateral aspect of her toe to have contact with the sole of her shoes.  I believe this is the source of her pain.  Neurological: Sensation diminished with 10 gram monofilament.  Vibratory sensation diminished   Assessment: 1. Painful onychomycosis toenails 1-5 b/l 2. Painful porokeratotic lesion noted distal lateral aspect of the right fifth digit 3. 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 Peripheral arterial disease  Plan: 1. Continue diabetic foot care principles. Literature dispensed. 2. Toenails 1-5 b/l were debrided in length and girth without iatrogenic bleeding. 3. Porokeratotic lesion(s) enucleated and pared with sterile chisel blade and gently smoothed with burr.  Antibiotic ointment was applied to this digit 4. We experimented with variety of digital pads and she responded favorably  to a silicone toe crest which lifted the fifth digit.  She is to wear this device daily.  5. Patient to continue soft, supportive shoe gear 6. Patient to report any pedal injuries to medical professional  7. Follow up 9 weeks.  8. Patient/POA to call should there be a concern in the interim.

## 2018-02-12 ENCOUNTER — Ambulatory Visit: Payer: Medicare HMO | Admitting: Podiatry

## 2018-02-15 DIAGNOSIS — Z794 Long term (current) use of insulin: Secondary | ICD-10-CM | POA: Diagnosis not present

## 2018-02-15 DIAGNOSIS — M810 Age-related osteoporosis without current pathological fracture: Secondary | ICD-10-CM | POA: Diagnosis not present

## 2018-02-15 DIAGNOSIS — E049 Nontoxic goiter, unspecified: Secondary | ICD-10-CM | POA: Diagnosis not present

## 2018-02-15 DIAGNOSIS — E119 Type 2 diabetes mellitus without complications: Secondary | ICD-10-CM | POA: Diagnosis not present

## 2018-02-15 DIAGNOSIS — G5601 Carpal tunnel syndrome, right upper limb: Secondary | ICD-10-CM | POA: Diagnosis not present

## 2018-02-15 DIAGNOSIS — E039 Hypothyroidism, unspecified: Secondary | ICD-10-CM | POA: Diagnosis not present

## 2018-02-25 DIAGNOSIS — M25511 Pain in right shoulder: Secondary | ICD-10-CM | POA: Diagnosis not present

## 2018-02-25 DIAGNOSIS — M549 Dorsalgia, unspecified: Secondary | ICD-10-CM | POA: Diagnosis not present

## 2018-02-25 DIAGNOSIS — G894 Chronic pain syndrome: Secondary | ICD-10-CM | POA: Diagnosis not present

## 2018-02-25 DIAGNOSIS — M19011 Primary osteoarthritis, right shoulder: Secondary | ICD-10-CM | POA: Diagnosis not present

## 2018-03-08 DIAGNOSIS — Z79899 Other long term (current) drug therapy: Secondary | ICD-10-CM | POA: Diagnosis not present

## 2018-03-12 ENCOUNTER — Ambulatory Visit: Payer: Medicare HMO | Admitting: Podiatry

## 2018-03-12 DIAGNOSIS — E1151 Type 2 diabetes mellitus with diabetic peripheral angiopathy without gangrene: Secondary | ICD-10-CM | POA: Diagnosis not present

## 2018-03-12 DIAGNOSIS — M79675 Pain in left toe(s): Secondary | ICD-10-CM | POA: Diagnosis not present

## 2018-03-12 DIAGNOSIS — L84 Corns and callosities: Secondary | ICD-10-CM

## 2018-03-12 DIAGNOSIS — M79674 Pain in right toe(s): Secondary | ICD-10-CM

## 2018-03-12 DIAGNOSIS — B351 Tinea unguium: Secondary | ICD-10-CM | POA: Diagnosis not present

## 2018-03-12 NOTE — Patient Instructions (Signed)
Onychomycosis/Fungal Toenails  WHAT IS IT? An infection that lies within the keratin of your nail plate that is caused by a fungus.  WHY ME? Fungal infections affect all ages, sexes, races, and creeds.  There may be many factors that predispose you to a fungal infection such as age, coexisting medical conditions such as diabetes, or an autoimmune disease; stress, medications, fatigue, genetics, etc.  Bottom line: fungus thrives in a warm, moist environment and your shoes offer such a location.  IS IT CONTAGIOUS? Theoretically, yes.  You do not want to share shoes, nail clippers or files with someone who has fungal toenails.  Walking around barefoot in the same room or sleeping in the same bed is unlikely to transfer the organism.  It is important to realize, however, that fungus can spread easily from one nail to the next on the same foot.  HOW DO WE TREAT THIS?  There are several ways to treat this condition.  Treatment may depend on many factors such as age, medications, pregnancy, liver and kidney conditions, etc.  It is best to ask your doctor which options are available to you.  1. No treatment.   Unlike many other medical concerns, you can live with this condition.  However for many people this can be a painful condition and may lead to ingrown toenails or a bacterial infection.  It is recommended that you keep the nails cut short to help reduce the amount of fungal nail. 2. Topical treatment.  These range from herbal remedies to prescription strength nail lacquers.  About 40-50% effective, topicals require twice daily application for approximately 9 to 12 months or until an entirely new nail has grown out.  The most effective topicals are medical grade medications available through physicians offices. 3. Oral antifungal medications.  With an 80-90% cure rate, the most common oral medication requires 3 to 4 months of therapy and stays in your system for a year as the new nail grows out.  Oral  antifungal medications do require blood work to make sure it is a safe drug for you.  A liver function panel will be performed prior to starting the medication and after the first month of treatment.  It is important to have the blood work performed to avoid any harmful side effects.  In general, this medication safe but blood work is required. 4. Laser Therapy.  This treatment is performed by applying a specialized laser to the affected nail plate.  This therapy is noninvasive, fast, and non-painful.  It is not covered by insurance and is therefore, out of pocket.  The results have been very good with a 80-95% cure rate.  [facility] is the only practice in the area to offer this therapy. Permanent Nail Avulsion.  Removing the entire nail so that a new nail will not grow back.Diabetes Mellitus and Foot Care Foot care is an important part of your health, especially when you have diabetes. Diabetes may cause you to have problems because of poor blood flow (circulation) to your feet and legs, which can cause your skin to:  Become thinner and drier.  Break more easily.  Heal more slowly.  Peel and crack. You may also have nerve damage (neuropathy) in your legs and feet, causing decreased feeling in them. This means that you may not notice minor injuries to your feet that could lead to more serious problems. Noticing and addressing any potential problems early is the best way to prevent future foot problems.   How to care for your feet Foot hygiene  Wash your feet daily with warm water and mild soap. Do not use hot water. Then, pat your feet and the areas between your toes until they are completely dry. Do not soak your feet as this can dry your skin.  Trim your toenails straight across. Do not dig under them or around the cuticle. File the edges of your nails with an emery board or nail file.  Apply a moisturizing lotion or petroleum jelly to the skin on your feet and to dry, brittle  toenails. Use lotion that does not contain alcohol and is unscented. Do not apply lotion between your toes. Shoes and socks  Wear clean socks or stockings every day. Make sure they are not too tight. Do not wear knee-high stockings since they may decrease blood flow to your legs.  Wear shoes that fit properly and have enough cushioning. Always look in your shoes before you put them on to be sure there are no objects inside.  To break in new shoes, wear them for just a few hours a day. This prevents injuries on your feet. Wounds, scrapes, corns, and calluses  Check your feet daily for blisters, cuts, bruises, sores, and redness. If you cannot see the bottom of your feet, use a mirror or ask someone for help.  Do not cut corns or calluses or try to remove them with medicine.  If you find a minor scrape, cut, or break in the skin on your feet, keep it and the skin around it clean and dry. You may clean these areas with mild soap and water. Do not clean the area with peroxide, alcohol, or iodine.  If you have a wound, scrape, corn, or callus on your foot, look at it several times a day to make sure it is healing and not infected. Check for: ? Redness, swelling, or pain. ? Fluid or blood. ? Warmth. ? Pus or a bad smell. General instructions  Do not cross your legs. This may decrease blood flow to your feet.  Do not use heating pads or hot water bottles on your feet. They may burn your skin. If you have lost feeling in your feet or legs, you may not know this is happening until it is too late.  Protect your feet from hot and cold by wearing shoes, such as at the beach or on hot pavement.  Schedule a complete foot exam at least once a year (annually) or more often if you have foot problems. If you have foot problems, report any cuts, sores, or bruises to your health care provider immediately. Contact a health care provider if:  You have a medical condition that increases your risk of  infection and you have any cuts, sores, or bruises on your feet.  You have an injury that is not healing.  You have redness on your legs or feet.  You feel burning or tingling in your legs or feet.  You have pain or cramps in your legs and feet.  Your legs or feet are numb.  Your feet always feel cold.  You have pain around a toenail. Get help right away if:  You have a wound, scrape, corn, or callus on your foot and: ? You have pain, swelling, or redness that gets worse. ? You have fluid or blood coming from the wound, scrape, corn, or callus. ? Your wound, scrape, corn, or callus feels warm to the touch. ? You have pus or   a bad smell coming from the wound, scrape, corn, or callus. ? You have a fever. ? You have a red line going up your leg. Summary  Check your feet every day for cuts, sores, red spots, swelling, and blisters.  Moisturize feet and legs daily.  Wear shoes that fit properly and have enough cushioning.  If you have foot problems, report any cuts, sores, or bruises to your health care provider immediately.  Schedule a complete foot exam at least once a year (annually) or more often if you have foot problems. This information is not intended to replace advice given to you by your health care provider. Make sure you discuss any questions you have with your health care provider. Document Released: 01/11/2000 Document Revised: 02/25/2017 Document Reviewed: 02/15/2016 Elsevier Interactive Patient Education  2019 [employer] are small areas of thickened skin that occur on the top, sides, or tip of a toe. They contain a cone-shaped core with a point that can press on a nerve below. This causes pain.  Calluses are areas of thickened skin that can occur anywhere on the body, including the hands, fingers, palms, soles of the feet, and heels. Calluses are usually larger than corns. What are the causes? Corns and calluses are caused by rubbing  (friction) or pressure, such as from shoes that are too tight or do not fit properly. What increases the risk? Corns are more likely to develop in people who have misshapen toes (toe deformities), such as hammer toes. Calluses can occur with friction to any area of the skin. They are more likely to develop in people who:  Work with their hands.  Wear shoes that fit poorly, are too tight, or are high-heeled.  Have toe deformities. What are the signs or symptoms? Symptoms of a corn or callus include:  A hard growth on the skin.  Pain or tenderness under the skin.  Redness and swelling.  Increased discomfort while wearing tight-fitting shoes, if your feet are affected. If a corn or callus becomes infected, symptoms may include:  Redness and swelling that gets worse.  Pain.  Fluid, blood, or pus draining from the corn or callus. How is this diagnosed? Corns and calluses may be diagnosed based on your symptoms, your medical history, and a physical exam. How is this treated? Treatment for corns and calluses may include:  Removing the cause of the friction or pressure. This may involve: ? Changing your shoes. ? Wearing shoe inserts (orthotics) or other protective layers in your shoes, such as a corn pad. ? Wearing gloves.  Applying medicine to the skin (topical medicine) to help soften skin in the hardened, thickened areas.  Removing layers of dead skin with a file to reduce the size of the corn or callus.  Removing the corn or callus with a scalpel or laser.  Taking antibiotic medicines, if your corn or callus is infected.  Having surgery, if a toe deformity is the cause. Follow these instructions at home:   Take over-the-counter and prescription medicines only as told by your health care provider.  If you were prescribed an antibiotic, take it as told by your health care provider. Do not stop taking it even if your condition starts to improve.  Wear shoes that fit  well. Avoid wearing high-heeled shoes and shoes that are too tight or too loose.  Wear any padding, protective layers, gloves, or orthotics as told by your health care provider.  Soak your hands or feet  and then use a file or pumice stone to soften your corn or callus. Do this as told by your health care provider.  Check your corn or callus every day for symptoms of infection. Contact a health care provider if you:  Notice that your symptoms do not improve with treatment.  Have redness or swelling that gets worse.  Notice that your corn or callus becomes painful.  Have fluid, blood, or pus coming from your corn or callus.  Have new symptoms. Summary  Corns are small areas of thickened skin that occur on the top, sides, or tip of a toe.  Calluses are areas of thickened skin that can occur anywhere on the body, including the hands, fingers, palms, and soles of the feet. Calluses are usually larger than corns.  Corns and calluses are caused by rubbing (friction) or pressure, such as from shoes that are too tight or do not fit properly.  Treatment may include wearing any padding, protective layers, gloves, or orthotics as told by your health care provider. This information is not intended to replace advice given to you by your health care provider. Make sure you discuss any questions you have with your health care provider. Document Released: 10/20/2003 Document Revised: 11/26/2016 Document Reviewed: 11/26/2016 Elsevier Interactive Patient Education  2019 [employer].

## 2018-03-15 ENCOUNTER — Encounter: Payer: Self-pay | Admitting: Podiatry

## 2018-03-15 NOTE — Progress Notes (Signed)
Subjective: Tammie West is a 85 y.o. y.o. female, brought to her appointment by her husband on today.  She presents for preventative foot care today with h/o PAD and cc of painful right fifth digit.  Patient states that digit is much better today.  She continues to wear her toe crest daily.  She has been able to bear weight without any pain since her last visit.  She denies any new pedal concerns on today's visit.   [name], MD is her PCP.   Current Outpatient Medications:  .  amitriptyline (ELAVIL) 25 MG tablet, Take 25 mg by mouth at bedtime., Disp: , Rfl:  .  amLODipine (NORVASC) 5 MG tablet, Take 5 mg by mouth daily., Disp: , Rfl:  .  aspirin EC 81 MG tablet, Take 1 tablet (81 mg total) by mouth daily., Disp: , Rfl:  .  clidinium-chlordiazePOXIDE (LIBRAX) 5-2.5 MG capsule, Take 1 capsule by mouth 2 (two) times daily., Disp: , Rfl:  .  EMBEDA 20-0.8 MG CPCR, , Disp: , Rfl:  .  escitalopram (LEXAPRO) 10 MG tablet, Take 10 mg by mouth daily., Disp: , Rfl:  .  furosemide (LASIX) 40 MG tablet, Take 1 tablet (40 mg total) by mouth every other day AND 0.5 tablets (20 mg total) every other day., Disp: 45 tablet, Rfl: 3 .  insulin glargine (LANTUS) 100 unit/mL SOPN, Inject 3 Units into the skin daily after breakfast. , Disp: , Rfl:  .  insulin lispro (HUMALOG KWIKPEN) 100 UNIT/ML KwikPen, Inject into the skin., Disp: , Rfl:  .  levothyroxine (SYNTHROID, LEVOTHROID) 75 MCG tablet, Take 88 mcg by mouth daily before breakfast. , Disp: , Rfl:  .  mesalamine (LIALDA) 1.2 G EC tablet, Take 2.4 g by mouth daily after breakfast., Disp: , Rfl:  .  nitroGLYCERIN (NITROSTAT) 0.4 MG SL tablet, Place 1 tablet (0.4 mg total) under the tongue every 5 (five) minutes as needed for chest pain., Disp: 25 tablet, Rfl: 3 .  Oxycodone HCl 10 MG TABS, Take 10 mg by mouth 2 (two) times daily. , Disp: , Rfl:  .  polyethylene glycol (MIRALAX / GLYCOLAX) packet, Take 17 g by mouth daily as needed for  mild constipation., Disp: , Rfl:  .  Probiotic Product (ALIGN PO), Take 1 capsule by mouth daily after breakfast., Disp: , Rfl:  .  ranitidine (ZANTAC) 150 MG capsule, Take 1 capsule by mouth daily., Disp: , Rfl:  .  simvastatin (ZOCOR) 10 MG tablet, Take 10 mg by mouth at bedtime. , Disp: , Rfl:   Allergies  Allergen Reactions  . Atorvastatin Nausea And Vomiting  . Byetta 10 Mcg Pen [Exenatide] Nausea And Vomiting  . Cefaclor Other (See Comments)    Pt does not remember the reaction  . Codeine Nausea And Vomiting       . Crestor [Rosuvastatin] Nausea And Vomiting  . Dexilant [Dexlansoprazole] Diarrhea  . Erythromycin Hives and Swelling  . Fosamax [Alendronate Sodium] Other (See Comments)    GI intolerance  . Macrobid [employer] Macro] Other (See Comments)    Pt does not remember the reaction  . Metformin And Related Nausea And Vomiting  . Nsaids     Elevated creatinine   . Penicillins Hives and Swelling    Has patient had a PCN reaction causing immediate rash, facial/tongue/throat swelling, SOB or lightheadedness with hypotension: Yes Has patient had a PCN reaction causing severe rash involving mucus membranes or skin necrosis: No Has patient had a PCN  reaction that required hospitalization No Has patient had a PCN reaction occurring within the last 10 years: No If all of the above answers are "NO", then may proceed with Cephalosporin use.  . Tolmetin Other (See Comments)    Elevated creatinine   . Shellfish Allergy Nausea And Vomiting, Other (See Comments) and Rash    Felt spaced out Felt spaced out Felt spaced out    Objective: Vascular Examination: Capillary refill time immediate x 10 digits  Dorsalis pedis pulses 1/4 b/l  Posterior tibial pulses absent b/l  No digital hair x 10 digits  Skin temperature gradient within normal limits bilaterally  Varicosities present bilateral lower extremities  Dermatological Examination: Skin with normal turgor  texture and tone bilaterally  Toenails 1-5 b/l discolored, thick, dystrophic with subungual debris and pain with palpation to nailbeds due to thickness of nails.  Porokeratotic lesion noted at the distal lateral aspect of the right fifth digit.  . There is no ischemia, no flocculence, no edema, no digital warmth, no abnormal coolness.   Musculoskeletal: Muscle strength 5/5 to all LE muscle groups  There is adductovarus deformity of the right fifth digit   Neurological: Sensation diminished with 10 gram monofilament.  Vibratory sensation diminished   Assessment: 1. Painful onychomycosis toenails 1-5 b/l 2. Painful porokeratotic lesion noted distal lateral aspect of the right fifth digit 3. 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 NIDDM with peripheral arterial disease  Plan: 1. Continue diabetic foot care principles. Literature dispensed. 2. Toenails 1-5 b/l were debrided in length and girth without iatrogenic bleeding. 3. Porokeratotic lesion(s) right 5th digit enucleated and pared with sterile chisel blade and gently smoothed with burr.  4. Continue silicone toe crest daily for right 5th digit. 5. Patient to continue soft, supportive shoe gear. 6. Patient to report any pedal injuries to medical professional  7. Follow up 9 weeks.  8. Patient/POA to call should there be a concern in the interim.

## 2018-03-17 DIAGNOSIS — R69 Illness, unspecified: Secondary | ICD-10-CM | POA: Diagnosis not present

## 2018-03-25 DIAGNOSIS — Z79899 Other long term (current) drug therapy: Secondary | ICD-10-CM | POA: Diagnosis not present

## 2018-03-25 DIAGNOSIS — M549 Dorsalgia, unspecified: Secondary | ICD-10-CM | POA: Diagnosis not present

## 2018-03-25 DIAGNOSIS — M25511 Pain in right shoulder: Secondary | ICD-10-CM | POA: Diagnosis not present

## 2018-03-25 DIAGNOSIS — M1990 Unspecified osteoarthritis, unspecified site: Secondary | ICD-10-CM | POA: Diagnosis not present

## 2018-03-25 DIAGNOSIS — G894 Chronic pain syndrome: Secondary | ICD-10-CM | POA: Diagnosis not present

## 2018-03-25 DIAGNOSIS — Z79891 Long term (current) use of opiate analgesic: Secondary | ICD-10-CM | POA: Diagnosis not present

## 2018-05-20 DIAGNOSIS — M19011 Primary osteoarthritis, right shoulder: Secondary | ICD-10-CM | POA: Diagnosis not present

## 2018-05-20 DIAGNOSIS — M1990 Unspecified osteoarthritis, unspecified site: Secondary | ICD-10-CM | POA: Diagnosis not present

## 2018-05-20 DIAGNOSIS — G894 Chronic pain syndrome: Secondary | ICD-10-CM | POA: Diagnosis not present

## 2018-05-20 DIAGNOSIS — G5600 Carpal tunnel syndrome, unspecified upper limb: Secondary | ICD-10-CM | POA: Diagnosis not present

## 2018-05-23 DIAGNOSIS — R69 Illness, unspecified: Secondary | ICD-10-CM | POA: Diagnosis not present

## 2018-05-24 DIAGNOSIS — R69 Illness, unspecified: Secondary | ICD-10-CM | POA: Diagnosis not present

## 2018-05-25 DIAGNOSIS — Z794 Long term (current) use of insulin: Secondary | ICD-10-CM | POA: Diagnosis not present

## 2018-05-25 DIAGNOSIS — M810 Age-related osteoporosis without current pathological fracture: Secondary | ICD-10-CM | POA: Diagnosis not present

## 2018-05-25 DIAGNOSIS — E039 Hypothyroidism, unspecified: Secondary | ICD-10-CM | POA: Diagnosis not present

## 2018-05-25 DIAGNOSIS — E119 Type 2 diabetes mellitus without complications: Secondary | ICD-10-CM | POA: Diagnosis not present

## 2018-05-25 DIAGNOSIS — E049 Nontoxic goiter, unspecified: Secondary | ICD-10-CM | POA: Diagnosis not present

## 2018-05-25 DIAGNOSIS — G5601 Carpal tunnel syndrome, right upper limb: Secondary | ICD-10-CM | POA: Diagnosis not present

## 2018-06-11 ENCOUNTER — Ambulatory Visit: Payer: Medicare HMO | Admitting: Podiatry

## 2018-06-24 DIAGNOSIS — G894 Chronic pain syndrome: Secondary | ICD-10-CM | POA: Diagnosis not present

## 2018-06-24 DIAGNOSIS — M1990 Unspecified osteoarthritis, unspecified site: Secondary | ICD-10-CM | POA: Diagnosis not present

## 2018-06-24 DIAGNOSIS — G5600 Carpal tunnel syndrome, unspecified upper limb: Secondary | ICD-10-CM | POA: Diagnosis not present

## 2018-06-24 DIAGNOSIS — M19011 Primary osteoarthritis, right shoulder: Secondary | ICD-10-CM | POA: Diagnosis not present

## 2018-07-05 ENCOUNTER — Encounter: Payer: Self-pay | Admitting: Podiatry

## 2018-07-08 ENCOUNTER — Other Ambulatory Visit: Payer: Self-pay

## 2018-07-08 ENCOUNTER — Ambulatory Visit: Payer: Medicare HMO | Admitting: Podiatry

## 2018-07-08 VITALS — Temp 97.3°F

## 2018-07-08 DIAGNOSIS — B351 Tinea unguium: Secondary | ICD-10-CM

## 2018-07-08 DIAGNOSIS — E119 Type 2 diabetes mellitus without complications: Secondary | ICD-10-CM

## 2018-07-08 DIAGNOSIS — M79675 Pain in left toe(s): Secondary | ICD-10-CM | POA: Diagnosis not present

## 2018-07-08 DIAGNOSIS — L600 Ingrowing nail: Secondary | ICD-10-CM

## 2018-07-08 DIAGNOSIS — M79674 Pain in right toe(s): Secondary | ICD-10-CM

## 2018-07-08 MED ORDER — DOXYCYCLINE HYCLATE 100 MG PO TABS: 100.0000 mg | ORAL_TABLET | Freq: Two times a day (BID) | ORAL | 0 refills | Status: DC

## 2018-07-08 NOTE — Patient Instructions (Signed)
If you notice any increase in pain, swelling, redness, drainage or any other signs of infection, please call us immediately.   I sent an antibiotic (doxycycline) to your pharmacy.   Stay well

## 2018-07-14 NOTE — Progress Notes (Signed)
Subjective:   Patient ID: Tammie West, female   DOB: 85 y.o.   MRN: [id number]   HPI 85 year old female presents the office today for concerns of thick, elongated toenails that she cannot trim her self.  She states on the medial aspect the right first toenail which point she states is getting ingrown causing discomfort at the tip of the toe.  She is wondering if the toe is getting infected. Denies any redness or drainage or swelling.  Also has a corn on the right fifth toe that is painful.  No open sores.  No recent treatment otherwise.  No other concerns.  Review of Systems  All other systems reviewed and are negative.  Past Medical History:  Diagnosis Date  . Bilateral carpal tunnel syndrome 02/17/2017  . Carpal tunnel syndrome   . CKD (chronic kidney disease) stage 4, GFR 15-29 ml/min (HCC)   . Coronary artery disease 11/2014    cath with Prox RCA to Mid RCA lesion, 45% stenosed and Ost LAD to Prox LAD lesion, 30% stenosed byt recent cath and high calcium score by coronary CTA.  . Crohn's disease ([place])   . Depression   . Diabetes mellitus without complication ([place])   . GERD (gastroesophageal reflux disease)   . Goiter   . Hyperlipidemia   . Hypertension   . Hypothyroidism   . Insomnia   . Migraine   . Mitral regurgitation    mild to moderate by echo 04/2017  . Osteoarthritis   . Osteoporosis   . Trochanteric bursitis     Past Surgical History:  Procedure Laterality Date  . ABDOMINAL HYSTERECTOMY    . APPENDECTOMY    . CARDIAC CATHETERIZATION N/A 12/13/2014   Procedure: Left Heart Cath and Coronary Angiography;  Surgeon: Belva Crome, MD;  Location: [facility];  Service: Cardiovascular;  Laterality: N/A;     Current Outpatient Medications:  .  Accu-Chek FastClix Lancets MISC, USE 1 LANCET TO CHECK BLOOD SUGAR 3 TIMES DAILY FOR TYPE 2 DIABETES MELLITUS ON INSULIN, Disp: , Rfl:  .  ACCU-CHEK SMARTVIEW test strip, USE TO CHECK BLOOD SUGAR THREE TIMES DAILY,  Disp: , Rfl:  .  amitriptyline (ELAVIL) 25 MG tablet, Take 25 mg by mouth at bedtime., Disp: , Rfl:  .  amLODipine (NORVASC) 5 MG tablet, Take 5 mg by mouth daily., Disp: , Rfl:  .  aspirin EC 81 MG tablet, Take 1 tablet (81 mg total) by mouth daily., Disp: , Rfl:  .  BD PEN NEEDLE NANO U/F 32G X 4 MM MISC, , Disp: , Rfl:  .  clidinium-chlordiazePOXIDE (LIBRAX) 5-2.5 MG capsule, Take 1 capsule by mouth 2 (two) times daily., Disp: , Rfl:  .  EMBEDA 20-0.8 MG CPCR, , Disp: , Rfl:  .  escitalopram (LEXAPRO) 10 MG tablet, Take 10 mg by mouth daily., Disp: , Rfl:  .  famotidine (PEPCID) 20 MG tablet, TAKE 1 TABLET BY MOUTH ONCE DAILY AT BEDTIME AS NEEDED FOR 90 DAYS, Disp: , Rfl:  .  insulin glargine (LANTUS) 100 unit/mL SOPN, Inject 3 Units into the skin daily after breakfast. , Disp: , Rfl:  .  levothyroxine (SYNTHROID) 88 MCG tablet, TAKE 1 TABLET BY MOUTH ONCE DAILY ON AN EMPTY STOMACH IN THE MORNING, Disp: , Rfl:  .  levothyroxine (SYNTHROID, LEVOTHROID) 75 MCG tablet, Take 88 mcg by mouth daily before breakfast. , Disp: , Rfl:  .  lidocaine (XYLOCAINE) 5 % ointment, Apply QID to affected area as  needed for pain., Disp: , Rfl:  .  lisinopril (ZESTRIL) 5 MG tablet, TAKE 1 TABLET BY MOUTH ONCE DAILY FOR 30 DAYS, Disp: , Rfl:  .  mesalamine (LIALDA) 1.2 G EC tablet, Take 2.4 g by mouth daily after breakfast., Disp: , Rfl:  .  MORPHABOND ER 15 MG T12A, , Disp: , Rfl:  .  nitroGLYCERIN (NITROSTAT) 0.4 MG SL tablet, Place 1 tablet (0.4 mg total) under the tongue every 5 (five) minutes as needed for chest pain., Disp: 25 tablet, Rfl: 3 .  Oxycodone HCl 10 MG TABS, Take 10 mg by mouth 2 (two) times daily. , Disp: , Rfl:  .  polyethylene glycol (MIRALAX / GLYCOLAX) packet, Take 17 g by mouth daily as needed for mild constipation., Disp: , Rfl:  .  Probiotic Product (ALIGN PO), Take 1 capsule by mouth daily after breakfast., Disp: , Rfl:  .  ranitidine (ZANTAC) 150 MG capsule, Take 1 capsule by mouth  daily., Disp: , Rfl:  .  simvastatin (ZOCOR) 10 MG tablet, Take 10 mg by mouth at bedtime. , Disp: , Rfl:  .  doxycycline (VIBRA-TABS) 100 MG tablet, Take 1 tablet (100 mg total) by mouth 2 (two) times daily., Disp: 14 tablet, Rfl: 0 .  furosemide (LASIX) 40 MG tablet, Take 1 tablet (40 mg total) by mouth every other day AND 0.5 tablets (20 mg total) every other day., Disp: 45 tablet, Rfl: 3 .  insulin lispro (HUMALOG KWIKPEN) 100 UNIT/ML KwikPen, Inject into the skin., Disp: , Rfl:   Allergies  Allergen Reactions  . Atorvastatin Nausea And Vomiting  . Byetta 10 Mcg Pen [Exenatide] Nausea And Vomiting  . Cefaclor Other (See Comments)    Pt does not remember the reaction  . Codeine Nausea And Vomiting       . Crestor [Rosuvastatin] Nausea And Vomiting  . Dexilant [Dexlansoprazole] Diarrhea  . Erythromycin Hives and Swelling  . Fosamax [Alendronate Sodium] Other (See Comments)    GI intolerance  . Macrobid [employer] Macro] Other (See Comments)    Pt does not remember the reaction  . Metformin And Related Nausea And Vomiting  . Nsaids     Elevated creatinine   . Penicillins Hives and Swelling    Has patient had a PCN reaction causing immediate rash, facial/tongue/throat swelling, SOB or lightheadedness with hypotension: Yes Has patient had a PCN reaction causing severe rash involving mucus membranes or skin necrosis: No Has patient had a PCN reaction that required hospitalization No Has patient had a PCN reaction occurring within the last 10 years: No If all of the above answers are "NO", then may proceed with Cephalosporin use.  . Tolmetin Other (See Comments)    Elevated creatinine   . Shellfish Allergy Nausea And Vomiting, Other (See Comments) and Rash    Felt spaced out Felt spaced out Felt spaced out          Objective:  Physical Exam  General: AAO x3, NAD  Dermatological: Nails are hypertrophic, dystrophic, brittle, discolored, elongated 10. No  surrounding redness or drainage. Tenderness nails 1-5 bilaterally.  Incurvation present on medial aspect of right hallux toenail. Hyperkeratotic lesion of the right fifth toe.  No underlying ulceration drainage or signs of infection.  No open lesions or pre-ulcerative lesions are identified today.  Vascular: Dorsalis Pedis artery and Posterior Tibial artery pedal pulses are 2/4 bilateral with immedate capillary fill time. There is no pain with calf compression, swelling, warmth, erythema.   Neruologic: Grossly intact  via light touch bilateral.  Musculoskeletal: No gross boney pedal deformities bilateral. No pain, crepitus, or limitation noted with foot and ankle range of motion bilateral. Muscular strength 5/5 in all groups tested bilateral.     Assessment:   Symptomatic onychomycosis, ingrown toenail; hyperkeratotic lesion    Plan:  -Treatment options discussed including all alternatives, risks, and complications -Etiology of symptoms were discussed -Nails debrided 10 without complications or bleeding.  I was able to debride the symptomatic portion of the ingrown toenail with any complications or bleeding. -Hyperkeratotic lesion sharply debrided x1 without complications with -Daily foot inspection -Follow-up in 1 week to check the nail/infection or sooner if any problems arise. In the meantime, encouraged to call the office with any questions, concerns, change in symptoms.   Trula Slade DPM

## 2018-07-16 ENCOUNTER — Ambulatory Visit: Payer: Medicare HMO | Admitting: Podiatry

## 2018-07-19 ENCOUNTER — Other Ambulatory Visit (HOSPITAL_COMMUNITY): Payer: Medicare HMO

## 2018-07-20 ENCOUNTER — Telehealth (HOSPITAL_COMMUNITY): Payer: Self-pay | Admitting: Radiology

## 2018-07-20 NOTE — Telephone Encounter (Signed)

## 2018-07-20 NOTE — Progress Notes (Deleted)
Cardiology Office Note:    Date:  07/20/2018   ID:  Tammie West, DOB [birth year], MRN [id number]  PCP:  [name], MD  Cardiologist:  Fransico Him, MD *** Electrophysiologist:  None   Referring MD: [name],*   No chief complaint on file. ***  History of Present Illness:    Tammie West is a 85 y.o. female with ***   Coronary artery disease (non-obstructive)  Hyperlipidemia   Diastolic CHF  Chronic kidney disease   Hypertension  Moderate mitral regurgitation   Tobacco use  Hypothyroidism  Crohn's Disease   Tammie West was last seen by Dr. Radford Pax in 06/2017.  ***  Prior CV studies:   The following studies were reviewed today:  Echo 05/25/17 EF 60-65, Gr 1 DD, mild to mod MR, mild LAE  ABIs 08/12/16 Normal   Echo 05/19/16 EF 60-65, no RWMA, mod MR, PASP 28  Cardiac Cath 12/13/14 LAD ost 30 RCA prox 45  Minimal coronary atherosclerosis involving the proximal RCA and mid and proximal LAD. No significant obstructive disease is noted.  Normal left ventricular function.  Coronary calcification and high calcium score.   Coronary CTA 12/04/14 IMPRESSION: 1. Coronary calcium score of 548 Agatston units, placing the patient in the 87th percentile for her age and gender, suggesting high risk for future cardiac events. 2. Extensive plaque in the coronary tree but only mild stenosis noted. However, the distal vessels are obscured by misregistration artifact, so cannot definitively comment on the distal vessels.  Myoview 09/15/14 The ejection fraction is 72%. There is normal wall motion. There is no evidence of scar or ischemia.  This is a normal stress nuclear scan. This is a low risk scan.    Past Medical History:  Diagnosis Date  . Bilateral carpal tunnel syndrome 02/17/2017  . Carpal tunnel syndrome   . CKD (chronic kidney disease) stage 4, GFR 15-29 ml/min (HCC)   . Coronary artery disease 11/2014   cath with Prox RCA to Mid RCA lesion, 45% stenosed and Ost LAD to Prox LAD lesion, 30% stenosed byt recent cath and high calcium score by coronary CTA.  . Crohn's disease ([place])   . Depression   . Diabetes mellitus without complication ([place])   . GERD (gastroesophageal reflux disease)   . Goiter   . Hyperlipidemia   . Hypertension   . Hypothyroidism   . Insomnia   . Migraine   . Mitral regurgitation    mild to moderate by echo 04/2017  . Osteoarthritis   . Osteoporosis   . Trochanteric bursitis    Surgical Hx: The patient  has a past surgical history that includes Abdominal hysterectomy; Appendectomy; and Cardiac catheterization (N/A, 12/13/2014).   Current Medications: No outpatient medications have been marked as taking for the 07/21/18 encounter (Appointment) with Richardson Dopp T, PA-C.     Allergies:   Atorvastatin, Byetta 10 mcg pen [exenatide], Cefaclor, Codeine, Crestor [rosuvastatin], Dexilant [dexlansoprazole], Erythromycin, Fosamax [alendronate sodium], Macrobid [nitrofurantoin monohyd macro], Metformin and related, Nsaids, Penicillins, Tolmetin, and Shellfish allergy   Social History   Tobacco Use  . Smoking status: Current Some Day Smoker    Packs/day: 0.25    Types: Cigarettes  . Smokeless tobacco: Never Used  Substance Use Topics  . Alcohol use: No  . Drug use: No     Family Hx: The patient's family history includes CAD in her brother; Emphysema in her father; Heart disease in her brother. There is no history of Heart attack or Stroke.  ROS:   Please see the history of present illness.    ROS All other systems reviewed and are negative.   EKGs/Labs/Other Test Reviewed:    EKG:  EKG is *** ordered today.  The ekg ordered today demonstrates ***  Recent Labs: 07/31/2017: BUN 23; Creatinine, Ser 1.25; Potassium 4.6; Sodium 138   Recent Lipid Panel Lab Results  Component Value Date/Time   CHOL 104 01/28/2017 09:30 AM   TRIG 101 01/28/2017 09:30 AM   HDL 52  01/28/2017 09:30 AM   CHOLHDL 2.0 01/28/2017 09:30 AM   CHOLHDL 2.0 01/08/2016 09:41 AM   LDLCALC 32 01/28/2017 09:30 AM    Physical Exam:    VS:  There were no vitals taken for this visit.    Wt Readings from Last 3 Encounters:  07/21/17 113 lb 12.8 oz (51.6 kg)  01/19/17 122 lb 6.4 oz (55.5 kg)  07/18/16 126 lb 1.9 oz (57.2 kg)     ***Physical Exam  ASSESSMENT & PLAN:    No diagnosis found.***  Dispo:  No follow-ups on file.   Medication Adjustments/Labs and Tests Ordered: Current medicines are reviewed at length with the patient today.  Concerns regarding medicines are outlined above.  Tests Ordered: No orders of the defined types were placed in this encounter.  Medication Changes: No orders of the defined types were placed in this encounter.   Signed, Richardson Dopp, PA-C  07/20/2018 9:44 PM    [facility] [address], [place], [place]  [address] Phone: [phone]; Fax: [phone]

## 2018-07-21 ENCOUNTER — Ambulatory Visit: Payer: Medicare HMO | Admitting: Family

## 2018-07-21 ENCOUNTER — Ambulatory Visit (HOSPITAL_COMMUNITY): Payer: Medicare HMO | Attending: Cardiology

## 2018-07-21 ENCOUNTER — Other Ambulatory Visit: Payer: Self-pay

## 2018-07-21 ENCOUNTER — Encounter: Payer: Self-pay | Admitting: Physician Assistant

## 2018-07-21 VITALS — BP 122/68 | HR 68 | Ht 64.0 in | Wt 107.0 lb

## 2018-07-21 DIAGNOSIS — I10 Essential (primary) hypertension: Secondary | ICD-10-CM

## 2018-07-21 DIAGNOSIS — E785 Hyperlipidemia, unspecified: Secondary | ICD-10-CM | POA: Diagnosis not present

## 2018-07-21 DIAGNOSIS — I340 Nonrheumatic mitral (valve) insufficiency: Secondary | ICD-10-CM

## 2018-07-21 DIAGNOSIS — I25118 Atherosclerotic heart disease of native coronary artery with other forms of angina pectoris: Secondary | ICD-10-CM

## 2018-07-21 DIAGNOSIS — I491 Atrial premature depolarization: Secondary | ICD-10-CM | POA: Diagnosis not present

## 2018-07-21 DIAGNOSIS — I2510 Atherosclerotic heart disease of native coronary artery without angina pectoris: Secondary | ICD-10-CM | POA: Diagnosis not present

## 2018-07-21 DIAGNOSIS — I5032 Chronic diastolic (congestive) heart failure: Secondary | ICD-10-CM | POA: Diagnosis not present

## 2018-07-21 DIAGNOSIS — I4510 Unspecified right bundle-branch block: Secondary | ICD-10-CM | POA: Diagnosis not present

## 2018-07-21 DIAGNOSIS — E119 Type 2 diabetes mellitus without complications: Secondary | ICD-10-CM | POA: Diagnosis not present

## 2018-07-21 NOTE — Progress Notes (Addendum)
Cardiology Office Note:    Date:  07/21/2018   ID:  Tammie West, DOB 1932/05/18, MRN [id number]  PCP:  Tammie Cha, MD  Cardiologist:  Tammie Him, MD  Electrophysiologist:  None   Referring MD: Tammie West,*   Chief Complaint  Patient presents with  . Follow-up  85 yo female presents for annual follow up of cardiac conditions including CAD, diastolic heart failure, mitral regurgitation.   History of Present Illness:    Tammie West is a 85 y.o. female with a hx of HTN, nonobstructive CAD, DLD, chronic diastolic CHF, moderate MR, chronic LE edema, tobacco use last seen by Tammie West 07/21/2017. Cath 2016 with Prox RCA to Mid RCA lesion 45% stenosed and Ost LAD to Prox LAD lesion 30% stenosed. She presents today for annual follow up and echocardiogram.   Tells me she has no concerns about her heart. Is most concerned by her chronic back pain and discouraged that injections have stopped working and she is on pain medication. No exercise regimen secondary to back pain. Activity extremely limited by back pain, cannot complete housework per her report.   Checks her BP regularly at home with readings 120s-130s/60s. Denies SOB, DOE, PND. Reports a fluttering in her chest "a time or two" over the last year.   Tells me she had an episode of "chest pressure, not pain" in the middle of her chest 2 weeks ago when she was having a particularly difficult day with her back. Rated 4/10 on pain scale. Did not radiate, no SOB. Reports she rested and it went away within the hour.   She continues to smoke 4-5 cigarettes per day. Smoking cessation encouraged.   Past Medical History:  Diagnosis Date  . Bilateral carpal tunnel syndrome 02/17/2017  . Carpal tunnel syndrome   . CKD (chronic kidney disease) stage 4, GFR 15-29 ml/min (HCC)   . Coronary artery disease 11/2014    cath with Prox RCA to Mid RCA lesion, 45% stenosed and Ost LAD to Prox LAD lesion, 30%  stenosed byt recent cath and high calcium score by coronary CTA.  . Crohn's disease ([place])   . Depression   . Diabetes mellitus without complication ([place])   . GERD (gastroesophageal reflux disease)   . Goiter   . Hyperlipidemia   . Hypertension   . Hypothyroidism   . Insomnia   . Migraine   . Mitral regurgitation    mild to moderate by echo 04/2017  . Osteoarthritis   . Osteoporosis   . Trochanteric bursitis     Past Surgical History:  Procedure Laterality Date  . ABDOMINAL HYSTERECTOMY    . APPENDECTOMY    . CARDIAC CATHETERIZATION N/A 12/13/2014   Procedure: Left Heart Cath and Coronary Angiography;  Surgeon: Belva Crome, MD;  Location: [facility];  Service: Cardiovascular;  Laterality: N/A;    Current Medications: Current Meds  Medication Sig  . Accu-Chek FastClix Lancets MISC USE 1 LANCET TO CHECK BLOOD SUGAR 3 TIMES DAILY FOR TYPE 2 DIABETES MELLITUS ON INSULIN  . ACCU-CHEK SMARTVIEW test strip USE TO CHECK BLOOD SUGAR THREE TIMES DAILY  . amitriptyline (ELAVIL) 25 MG tablet Take 25 mg by mouth at bedtime.  Marland Kitchen amLODipine (NORVASC) 5 MG tablet Take 5 mg by mouth daily.  Marland Kitchen aspirin EC 81 MG tablet Take 1 tablet (81 mg total) by mouth daily.  . BD PEN NEEDLE NANO U/F 32G X 4 MM MISC   . clidinium-chlordiazePOXIDE (LIBRAX) 5-2.5 MG capsule  Take 1 capsule by mouth 2 (two) times daily.  Marland Kitchen doxycycline (VIBRA-TABS) 100 MG tablet Take 1 tablet (100 mg total) by mouth 2 (two) times daily.  . EMBEDA 20-0.8 MG CPCR   . escitalopram (LEXAPRO) 10 MG tablet Take 10 mg by mouth daily.  . famotidine (PEPCID) 20 MG tablet TAKE 1 TABLET BY MOUTH ONCE DAILY AT BEDTIME AS NEEDED FOR 90 DAYS  . furosemide (LASIX) 40 MG tablet Take 1 tablet (40 mg total) by mouth every other day AND 0.5 tablets (20 mg total) every other day.  . insulin glargine (LANTUS) 100 unit/mL SOPN Inject 3 Units into the skin daily after breakfast.   . levothyroxine (SYNTHROID) 88 MCG tablet TAKE 1 TABLET BY MOUTH  ONCE DAILY ON AN EMPTY STOMACH IN THE MORNING  . lidocaine (XYLOCAINE) 5 % ointment Apply QID to affected area as needed for pain.  . mesalamine (LIALDA) 1.2 G EC tablet Take 2.4 g by mouth daily after breakfast.  . MORPHABOND ER 15 MG T12A   . nitroGLYCERIN (NITROSTAT) 0.4 MG SL tablet Place 1 tablet (0.4 mg total) under the tongue every 5 (five) minutes as needed for chest pain.  . Oxycodone HCl 10 MG TABS Take 10 mg by mouth 2 (two) times daily.   . polyethylene glycol (MIRALAX / GLYCOLAX) packet Take 17 g by mouth daily as needed for mild constipation.  . Probiotic Product (ALIGN PO) Take 1 capsule by mouth daily after breakfast.  . ranitidine (ZANTAC) 150 MG capsule Take 1 capsule by mouth daily.  . simvastatin (ZOCOR) 10 MG tablet Take 10 mg by mouth at bedtime.      Allergies:   Atorvastatin, Byetta 10 mcg pen [exenatide], Cefaclor, Codeine, Crestor [rosuvastatin], Dexilant [dexlansoprazole], Erythromycin, Fosamax [alendronate sodium], Macrobid [nitrofurantoin monohyd macro], Metformin and related, Nsaids, Penicillins, Tolmetin, and Shellfish allergy   Social History   Socioeconomic History  . Marital status: Married    Spouse name: Not on file  . Number of children: Not on file  . Years of education: Not on file  . Highest education level: Not on file  Occupational History  . Not on file  Social Needs  . Financial resource strain: Not on file  . Food insecurity    Worry: Not on file    Inability: Not on file  . Transportation needs    Medical: Not on file    Non-medical: Not on file  Tobacco Use  . Smoking status: Current Some Day Smoker    Packs/day: 0.25    Types: Cigarettes  . Smokeless tobacco: Never Used  Substance and Sexual Activity  . Alcohol use: No  . Drug use: No  . Sexual activity: Not on file  Lifestyle  . Physical activity    Days per week: Not on file    Minutes per session: Not on file  . Stress: Not on file  Relationships  . Social Product manager on phone: Not on file    Gets together: Not on file    Attends religious service: Not on file    Active member of club or organization: Not on file    Attends meetings of clubs or organizations: Not on file    Relationship status: Not on file  Other Topics Concern  . Not on file  Social History Narrative  . Not on file     Family History: The patient's family history includes CAD in her brother; Emphysema in her father; Heart disease in  her brother. There is no history of Heart attack or Stroke.  ROS:   Please see the history of present illness.    Review of Systems  Constitution: Negative for chills, fever and malaise/fatigue.  Cardiovascular: Positive for leg swelling (intermittent) and palpitations. Negative for chest pain, dyspnea on exertion, irregular heartbeat, near-syncope and syncope.       Isolated episode "chest pressure" rated 4/10 a few weeks ago attributed with chronic back pain that self-resolved and has not recurred  Respiratory: Negative for cough, shortness of breath and wheezing.   Musculoskeletal: Positive for back pain (chronic).  Gastrointestinal: Negative for nausea and vomiting.  Neurological: Negative for dizziness, light-headedness and weakness.    All other systems reviewed and are negative.  EKGs/Labs/Other Studies Reviewed:    The following studies were reviewed today:  Echo 04/2017: Study Conclusions   - Left ventricle: The cavity size was normal. Wall thickness was   normal. Systolic function was normal. The estimated ejection   fraction was in the range of 60% to 65%. Doppler parameters are   consistent with abnormal left ventricular relaxation (grade 1   diastolic dysfunction). - Mitral valve: MR is mild to moderate, directed posterior into LA. - Left atrium: The atrium was mildly dilated.   EKG:  EKG is  ordered today.  The ekg ordered today 07/21/18 demonstrates sinus rhythm rate 68 with right bundle branch block and PAC. Noted  transient RBBB from EKG 05/2016.   Recent Labs: 07/31/2017: BUN 23; Creatinine, Ser 1.25; Potassium 4.6; Sodium 138  Recent Lipid Panel    Component Value Date/Time   CHOL 104 01/28/2017 0930   TRIG 101 01/28/2017 0930   HDL 52 01/28/2017 0930   CHOLHDL 2.0 01/28/2017 0930   CHOLHDL 2.0 01/08/2016 0941   VLDL 18 01/08/2016 0941   LDLCALC 32 01/28/2017 0930    Physical Exam:    VS:  BP 122/68 (BP Location: Left Arm, Patient Position: Sitting, Cuff Size: Normal)   Pulse 68   Ht 5\' 4"  (1.626 m)   Wt 107 lb (48.5 kg)   BMI 18.37 kg/m     Wt Readings from Last 3 Encounters:  07/21/18 107 lb (48.5 kg)  07/21/17 113 lb 12.8 oz (51.6 kg)  01/19/17 122 lb 6.4 oz (55.5 kg)     GEN:  Well nourished, well developed in no acute distress HEENT: Normal NECK: No JVD; No carotid bruits LYMPHATICS: No lymphadenopathy CARDIAC: RRR, no murmurs, rubs, gallops RESPIRATORY:  Clear to auscultation without rales, wheezing or rhonchi  ABDOMEN: Soft, non-tender, non-distended MUSCULOSKELETAL:  No edema; No deformity  SKIN: Warm and dry NEUROLOGIC:  Alert and oriented x 3 PSYCHIATRIC:  Normal affect   ASSESSMENT:    1. Coronary artery disease of native artery of native heart with stable angina pectoris ([place])   2. Chronic diastolic CHF (congestive heart failure) ([place])   3. Non-rheumatic mitral regurgitation   4. Essential hypertension   5. Hyperlipidemia with target LDL less than 70   6. Diabetes mellitus without complication ([place])   7. PAC (premature atrial contraction)   8. RBBB    PLAN:    In order of problems listed above:  1. CAD with stable angina - Cath 2016 nonobstructive CAD: prox RCA to mid RCA lesion 45% stenosed, Ost LAD to prox LAD lesion 30% stenosed. One episode of "chest pressure not pain" 2 weeks ago that she attributes with bad day with her chronic back pain. Rated 4/10, lasted approx an hour, relieved  by rest, did not limit her activities. She agreed to call the office if  this recurs. She reports these episodes are less frequent than previous and do not worry her. EKG stable compared to previous, only new finding of PAC. She was offered ischemic evaluation, but politely declines. Defer ischemic evaluation at this time in the setting of isolated episode and stable EKG. Continues to smoke 4-5 cigarettes per day, smoking cessation encouraged. Continue GDMT aspirin and statin.  2. Diastolic CHF - Euvolemic on exam. No SOB, PND. NYHA I, activity limited by back pain. Has decreased dose of Lasix with her PCP over the last year due to CKD3 without increase in edema. Continue Lasix 20 mg. Creatinine 1.03 on 03/08/18. Recheck CMET today. 3. MV regurgitation - Stable. No murmur on exam. Echo repeated today, not yet read. 4. HTN - Stable. BP well controlled. Continue Amlodipine 5mg  daily. 5. RBBB - Noted on EKG today. Transient, noted 05/2016. No pre-syncope. Follow with annual EKG. 6. PAC - New on EKG today. Reports "heart fluttering" every once in awhile over the last year, otherwise asymptomatic. Follow with annual EKG.  7. HLD with LDL goal <70 - LDL 33 in 09/2017. Repeat lipid panel today. Continue Simvastatin.  8. DM2 - Follows with her PCP. A1C 03/08/18 8.7. If additional agents needed, consider SGLT2.     Medication Adjustments/Labs and Tests Ordered: Current medicines are reviewed at length with the patient today.  Concerns regarding medicines are outlined above.  Orders Placed This Encounter  Procedures  . Comprehensive metabolic panel  . Lipid panel  . EKG 12-Lead   No orders of the defined types were placed in this encounter.   Patient Instructions  Medication Instructions:  Your physician recommends that you continue on your current medications as directed. Please refer to the Current Medication list given to you today.   If you need a refill on your cardiac medications before your next appointment, please call your pharmacy.   Lab work: CMET  AND LIPIDS  TODAY   If you have labs (blood work) drawn today and your tests are completely normal, you will receive your results only by: Marland Kitchen MyChart Message (if you have MyChart) OR . A paper copy in the mail If you have any lab test that is abnormal or we need to change your treatment, we will call you to review the results.  Testing/Procedures: NONE ORDERED  TODAY   Follow-Up: At [facility], you and your health needs are our priority.  As part of our continuing mission to provide you with exceptional heart care, we have created designated Provider Care Teams.  These Care Teams include your primary Cardiologist (physician) and Advanced Practice Providers (APPs -  Physician Assistants and Nurse Practitioners) who all work together to provide you with the care you need, when you need it. You will need a follow up appointment in 1 years.  Please call our office 2 months in advance to schedule this appointment.  You may see Tammie Him, MD or one of the following Advanced Practice Providers on your designated Care Team:   [place], PA-C Melina Copa, PA-C . Ermalinda Barrios, PA-C  Any Other Special Instructions Will Be Listed Below (If Applicable).       Signed, Loel Dubonnet, NP  07/21/2018 1:20 PM    [facility]

## 2018-07-21 NOTE — Patient Instructions (Addendum)
Medication Instructions:  Your physician recommends that you continue on your current medications as directed. Please refer to the Current Medication list given to you today.   If you need a refill on your cardiac medications before your next appointment, please call your pharmacy.   Lab work: CMET  AND LIPIDS TODAY   If you have labs (blood work) drawn today and your tests are completely normal, you will receive your results only by: Marland Kitchen MyChart Message (if you have MyChart) OR . A paper copy in the mail If you have any lab test that is abnormal or we need to change your treatment, we will call you to review the results.  Testing/Procedures: NONE ORDERED  TODAY   Follow-Up: At [facility], you and your health needs are our priority.  As part of our continuing mission to provide you with exceptional heart care, we have created designated Provider Care Teams.  These Care Teams include your primary Cardiologist (physician) and Advanced Practice Providers (APPs -  Physician Assistants and Nurse Practitioners) who all work together to provide you with the care you need, when you need it. You will need a follow up appointment in 1 years.  Please call our office 2 months in advance to schedule this appointment.  You may see Fransico Him, MD or one of the following Advanced Practice Providers on your designated Care Team:   [place], PA-C Melina Copa, PA-C . Ermalinda Barrios, PA-C  Any Other Special Instructions Will Be Listed Below (If Applicable).

## 2018-07-22 LAB — COMPREHENSIVE METABOLIC PANEL
ALT: 10 IU/L (ref 0–32)
AST: 12 IU/L (ref 0–40)
Albumin/Globulin Ratio: 1.4 (ref 1.2–2.2)
Albumin: 3.7 g/dL (ref 3.6–4.6)
Alkaline Phosphatase: 135 IU/L — ABNORMAL HIGH (ref 39–117)
BUN/Creatinine Ratio: 30 — ABNORMAL HIGH (ref 12–28)
BUN: 34 mg/dL — ABNORMAL HIGH (ref 8–27)
Bilirubin Total: 0.2 mg/dL (ref 0.0–1.2)
CO2: 21 mmol/L (ref 20–29)
Calcium: 9.8 mg/dL (ref 8.7–10.3)
Chloride: 105 mmol/L (ref 96–106)
Creatinine, Ser: 1.12 mg/dL — ABNORMAL HIGH (ref 0.57–1.00)
GFR calc Af Amer: 52 mL/min/{1.73_m2} — ABNORMAL LOW (ref >59)
GFR calc non Af Amer: 46 mL/min/{1.73_m2} — ABNORMAL LOW (ref >59)
Globulin, Total: 2.6 g/dL (ref 1.5–4.5)
Glucose: 106 mg/dL — ABNORMAL HIGH (ref 65–99)
Potassium: 4.9 mmol/L (ref 3.5–5.2)
Sodium: 139 mmol/L (ref 134–144)
Total Protein: 6.3 g/dL (ref 6.0–8.5)

## 2018-07-22 LAB — LIPID PANEL
Chol/HDL Ratio: 1.7 ratio (ref 0.0–4.4)
Cholesterol, Total: 88 mg/dL — ABNORMAL LOW (ref 100–199)
HDL: 51 mg/dL (ref >39)
LDL Calculated: 23 mg/dL (ref 0–99)
Triglycerides: 68 mg/dL (ref 0–149)
VLDL Cholesterol Cal: 14 mg/dL (ref 5–40)

## 2018-07-22 NOTE — Progress Notes (Signed)
Please inform Tammie West of her results. Thank you!  Kidney function stable from last year. Liver function and electrolytes all look good. Cholesterol levels are fantastic. Keep up the good work!

## 2018-07-29 DIAGNOSIS — M25511 Pain in right shoulder: Secondary | ICD-10-CM | POA: Diagnosis not present

## 2018-07-29 DIAGNOSIS — Z79891 Long term (current) use of opiate analgesic: Secondary | ICD-10-CM | POA: Diagnosis not present

## 2018-07-29 DIAGNOSIS — Z79899 Other long term (current) drug therapy: Secondary | ICD-10-CM | POA: Diagnosis not present

## 2018-07-29 DIAGNOSIS — M19011 Primary osteoarthritis, right shoulder: Secondary | ICD-10-CM | POA: Diagnosis not present

## 2018-07-29 DIAGNOSIS — G894 Chronic pain syndrome: Secondary | ICD-10-CM | POA: Diagnosis not present

## 2018-07-29 DIAGNOSIS — M47814 Spondylosis without myelopathy or radiculopathy, thoracic region: Secondary | ICD-10-CM | POA: Diagnosis not present

## 2018-07-31 DIAGNOSIS — R69 Illness, unspecified: Secondary | ICD-10-CM | POA: Diagnosis not present

## 2018-08-05 DIAGNOSIS — Z79891 Long term (current) use of opiate analgesic: Secondary | ICD-10-CM | POA: Diagnosis not present

## 2018-08-05 DIAGNOSIS — M1990 Unspecified osteoarthritis, unspecified site: Secondary | ICD-10-CM | POA: Diagnosis not present

## 2018-08-05 DIAGNOSIS — M19011 Primary osteoarthritis, right shoulder: Secondary | ICD-10-CM | POA: Diagnosis not present

## 2018-08-05 DIAGNOSIS — G894 Chronic pain syndrome: Secondary | ICD-10-CM | POA: Diagnosis not present

## 2018-08-05 DIAGNOSIS — Z79899 Other long term (current) drug therapy: Secondary | ICD-10-CM | POA: Diagnosis not present

## 2018-08-05 DIAGNOSIS — M25511 Pain in right shoulder: Secondary | ICD-10-CM | POA: Diagnosis not present

## 2018-08-09 ENCOUNTER — Encounter: Payer: Self-pay | Admitting: Podiatry

## 2018-08-09 ENCOUNTER — Ambulatory Visit: Payer: Medicare HMO | Admitting: Podiatry

## 2018-08-09 ENCOUNTER — Other Ambulatory Visit: Payer: Self-pay

## 2018-08-09 VITALS — Temp 97.6°F

## 2018-08-09 DIAGNOSIS — Q828 Other specified congenital malformations of skin: Secondary | ICD-10-CM | POA: Diagnosis not present

## 2018-08-09 DIAGNOSIS — E11621 Type 2 diabetes mellitus with foot ulcer: Secondary | ICD-10-CM

## 2018-08-09 DIAGNOSIS — L97519 Non-pressure chronic ulcer of other part of right foot with unspecified severity: Secondary | ICD-10-CM

## 2018-08-09 NOTE — Patient Instructions (Signed)
Corns and Calluses Corns are small areas of thickened skin that occur on the top, sides, or tip of a toe. They contain a cone-shaped core with a point that can press on a nerve below. This causes pain.  Calluses are areas of thickened skin that can occur anywhere on the body, including the hands, fingers, palms, soles of the feet, and heels. Calluses are usually larger than corns. What are the causes? Corns and calluses are caused by rubbing (friction) or pressure, such as from shoes that are too tight or do not fit properly. What increases the risk? Corns are more likely to develop in people who have misshapen toes (toe deformities), such as hammer toes. Calluses can occur with friction to any area of the skin. They are more likely to develop in people who:  Work with their hands.  Wear shoes that fit poorly, are too tight, or are high-heeled.  Have toe deformities. What are the signs or symptoms? Symptoms of a corn or callus include:  A hard growth on the skin.  Pain or tenderness under the skin.  Redness and swelling.  Increased discomfort while wearing tight-fitting shoes, if your feet are affected. If a corn or callus becomes infected, symptoms may include:  Redness and swelling that gets worse.  Pain.  Fluid, blood, or pus draining from the corn or callus. How is this diagnosed? Corns and calluses may be diagnosed based on your symptoms, your medical history, and a physical exam. How is this treated? Treatment for corns and calluses may include:  Removing the cause of the friction or pressure. This may involve: ? Changing your shoes. ? Wearing shoe inserts (orthotics) or other protective layers in your shoes, such as a corn pad. ? Wearing gloves.  Applying medicine to the skin (topical medicine) to help soften skin in the hardened, thickened areas.  Removing layers of dead skin with a file to reduce the size of the corn or callus.  Removing the corn or callus with a  scalpel or laser.  Taking antibiotic medicines, if your corn or callus is infected.  Having surgery, if a toe deformity is the cause. Follow these instructions at home:   Take over-the-counter and prescription medicines only as told by your health care provider.  If you were prescribed an antibiotic, take it as told by your health care provider. Do not stop taking it even if your condition starts to improve.  Wear shoes that fit well. Avoid wearing high-heeled shoes and shoes that are too tight or too loose.  Wear any padding, protective layers, gloves, or orthotics as told by your health care provider.  Soak your hands or feet and then use a file or pumice stone to soften your corn or callus. Do this as told by your health care provider.  Check your corn or callus every day for symptoms of infection. Contact a health care provider if you:  Notice that your symptoms do not improve with treatment.  Have redness or swelling that gets worse.  Notice that your corn or callus becomes painful.  Have fluid, blood, or pus coming from your corn or callus.  Have new symptoms. Summary  Corns are small areas of thickened skin that occur on the top, sides, or tip of a toe.  Calluses are areas of thickened skin that can occur anywhere on the body, including the hands, fingers, palms, and soles of the feet. Calluses are usually larger than corns.  Corns and calluses are caused by   rubbing (friction) or pressure, such as from shoes that are too tight or do not fit properly.  Treatment may include wearing any padding, protective layers, gloves, or orthotics as told by your health care provider. This information is not intended to replace advice given to you by your health care provider. Make sure you discuss any questions you have with your health care provider. Document Released: 10/20/2003 Document Revised: 05/05/2018 Document Reviewed: 11/26/2016 Elsevier Patient Education  2020 Elsevier  Inc.   Onychomycosis/Fungal Toenails  WHAT IS IT? An infection that lies within the keratin of your nail plate that is caused by a fungus.  WHY ME? Fungal infections affect all ages, sexes, races, and creeds.  There may be many factors that predispose you to a fungal infection such as age, coexisting medical conditions such as diabetes, or an autoimmune disease; stress, medications, fatigue, genetics, etc.  Bottom line: fungus thrives in a warm, moist environment and your shoes offer such a location.  IS IT CONTAGIOUS? Theoretically, yes.  You do not want to share shoes, nail clippers or files with someone who has fungal toenails.  Walking around barefoot in the same room or sleeping in the same bed is unlikely to transfer the organism.  It is important to realize, however, that fungus can spread easily from one nail to the next on the same foot.  HOW DO WE TREAT THIS?  There are several ways to treat this condition.  Treatment may depend on many factors such as age, medications, pregnancy, liver and kidney conditions, etc.  It is best to ask your doctor which options are available to you.  1. No treatment.   Unlike many other medical concerns, you can live with this condition.  However for many people this can be a painful condition and may lead to ingrown toenails or a bacterial infection.  It is recommended that you keep the nails cut short to help reduce the amount of fungal nail. 2. Topical treatment.  These range from herbal remedies to prescription strength nail lacquers.  About 40-50% effective, topicals require twice daily application for approximately 9 to 12 months or until an entirely new nail has grown out.  The most effective topicals are medical grade medications available through physicians offices. 3. Oral antifungal medications.  With an 80-90% cure rate, the most common oral medication requires 3 to 4 months of therapy and stays in your system for a year as the new nail grows out.   Oral antifungal medications do require blood work to make sure it is a safe drug for you.  A liver function panel will be performed prior to starting the medication and after the first month of treatment.  It is important to have the blood work performed to avoid any harmful side effects.  In general, this medication safe but blood work is required. 4. Laser Therapy.  This treatment is performed by applying a specialized laser to the affected nail plate.  This therapy is noninvasive, fast, and non-painful.  It is not covered by insurance and is therefore, out of pocket.  The results have been very good with a 80-95% cure rate.  The [facility] is the only practice in the area to offer this therapy. 5. Permanent Nail Avulsion.  Removing the entire nail so that a new nail will not grow back. 

## 2018-08-10 ENCOUNTER — Ambulatory Visit: Payer: Medicare HMO | Admitting: Podiatry

## 2018-08-12 ENCOUNTER — Encounter: Payer: Self-pay | Admitting: Podiatry

## 2018-08-12 NOTE — Progress Notes (Signed)
Subjective: Tammie West is a 85 y.o. y.o. female who presents today for follow up painful lesion right 5th digit. She saw Dr. Jacqualyn Posey last month and she states treatment provided relief. She feels the lesion has come back and is having difficulty wearing any type of shoe gear, enclosed or open-toed.  [name], MD is her PCP.    Current Outpatient Medications:  .  Accu-Chek FastClix Lancets MISC, USE 1 LANCET TO CHECK BLOOD SUGAR 3 TIMES DAILY FOR TYPE 2 DIABETES MELLITUS ON INSULIN, Disp: , Rfl:  .  ACCU-CHEK SMARTVIEW test strip, USE TO CHECK BLOOD SUGAR THREE TIMES DAILY, Disp: , Rfl:  .  amitriptyline (ELAVIL) 25 MG tablet, Take 25 mg by mouth at bedtime., Disp: , Rfl:  .  amLODipine (NORVASC) 5 MG tablet, Take 5 mg by mouth daily., Disp: , Rfl:  .  aspirin EC 81 MG tablet, Take 1 tablet (81 mg total) by mouth daily., Disp: , Rfl:  .  BD PEN NEEDLE NANO U/F 32G X 4 MM MISC, , Disp: , Rfl:  .  clidinium-chlordiazePOXIDE (LIBRAX) 5-2.5 MG capsule, Take 1 capsule by mouth 2 (two) times daily., Disp: , Rfl:  .  doxycycline (VIBRA-TABS) 100 MG tablet, Take 1 tablet (100 mg total) by mouth 2 (two) times daily., Disp: 14 tablet, Rfl: 0 .  EMBEDA 20-0.8 MG CPCR, , Disp: , Rfl:  .  escitalopram (LEXAPRO) 10 MG tablet, Take 10 mg by mouth daily., Disp: , Rfl:  .  famotidine (PEPCID) 20 MG tablet, TAKE 1 TABLET BY MOUTH ONCE DAILY AT BEDTIME AS NEEDED FOR 90 DAYS, Disp: , Rfl:  .  furosemide (LASIX) 40 MG tablet, Take 1 tablet (40 mg total) by mouth every other day AND 0.5 tablets (20 mg total) every other day., Disp: 45 tablet, Rfl: 3 .  insulin glargine (LANTUS) 100 unit/mL SOPN, Inject 3 Units into the skin daily after breakfast. , Disp: , Rfl:  .  levothyroxine (SYNTHROID) 88 MCG tablet, TAKE 1 TABLET BY MOUTH ONCE DAILY ON AN EMPTY STOMACH IN THE MORNING, Disp: , Rfl:  .  lidocaine (XYLOCAINE) 5 % ointment, Apply QID to affected area as needed for pain., Disp: , Rfl:  .   mesalamine (LIALDA) 1.2 G EC tablet, Take 2.4 g by mouth daily after breakfast., Disp: , Rfl:  .  MORPHABOND ER 15 MG T12A, , Disp: , Rfl:  .  nitroGLYCERIN (NITROSTAT) 0.4 MG SL tablet, Place 1 tablet (0.4 mg total) under the tongue every 5 (five) minutes as needed for chest pain., Disp: 25 tablet, Rfl: 3 .  Oxycodone HCl 10 MG TABS, Take 10 mg by mouth 2 (two) times daily. , Disp: , Rfl:  .  polyethylene glycol (MIRALAX / GLYCOLAX) packet, Take 17 g by mouth daily as needed for mild constipation., Disp: , Rfl:  .  Probiotic Product (ALIGN PO), Take 1 capsule by mouth daily after breakfast., Disp: , Rfl:  .  ranitidine (ZANTAC) 150 MG capsule, Take 1 capsule by mouth daily., Disp: , Rfl:  .  simvastatin (ZOCOR) 10 MG tablet, Take 10 mg by mouth at bedtime. , Disp: , Rfl:   Allergies  Allergen Reactions  . Atorvastatin Nausea And Vomiting  . Byetta 10 Mcg Pen [Exenatide] Nausea And Vomiting  . Cefaclor Other (See Comments)    Pt does not remember the reaction  . Codeine Nausea And Vomiting       . Crestor [Rosuvastatin] Nausea And Vomiting  . Dexilant [Dexlansoprazole]  Diarrhea  . Erythromycin Hives and Swelling  . Fosamax [Alendronate Sodium] Other (See Comments)    GI intolerance  . Macrobid [employer] Macro] Other (See Comments)    Pt does not remember the reaction  . Metformin And Related Nausea And Vomiting  . Nsaids     Elevated creatinine   . Penicillins Hives and Swelling    Has patient had a PCN reaction causing immediate rash, facial/tongue/throat swelling, SOB or lightheadedness with hypotension: Yes Has patient had a PCN reaction causing severe rash involving mucus membranes or skin necrosis: No Has patient had a PCN reaction that required hospitalization No Has patient had a PCN reaction occurring within the last 10 years: No If all of the above answers are "NO", then may proceed with Cephalosporin use.  . Tolmetin Other (See Comments)    Elevated  creatinine   . Shellfish Allergy Nausea And Vomiting, Other (See Comments) and Rash    Felt spaced out Felt spaced out Felt spaced out    Objective: Vitals:   08/09/18 1507  Temp: 97.6 F (36.4 C)    Vascular Examination: Capillary refill time immediate x 10 digits.  Dorsalis pedis pulses 2/4 b/l.  Posterior tibial pulses 2/4 b/l.  Digital hair absent x 10 digits.  Skin temperature gradient WNL b/l.  Dermatological Examination: Skin with normal turgor, texture and tone b/l.  Toenails 1-5 b/l discolored, thick, dystrophic recently debrided.  Preulcerative porokeratotic lesion distal-lateral aspect 5th digit right foot with tenderness to palpation. +Subdermal hemorrhage,  no edema, no drainage, no flocculence. Measures 0.5 x 0.6 cm.   Musculoskeletal: Muscle strength 5/5 to all LE muscle groups  Neurological: Sensation diminished with 10 gram monofilament.  Vibratory sensation diminished b/l.  Assessment: 1. Preulcerative porokeratotic lesion right 5th digit 2. NIDDM  Plan: 1. Continue diabetic foot care principles. Literature dispensed on today. 2. Preulcerative lesion(s) right 5th digit pared with sterile scalpel blade without incident.  Continue toe crest daily for right 5th digit. 3. Patient to continue soft, supportive shoe gear daily. 4. Patient to report any pedal injuries to medical professional immediately. 5. Follow up 3 months. 6. Patient/POA to call should there be a concern in the interim.

## 2018-08-25 DIAGNOSIS — N183 Chronic kidney disease, stage 3 (moderate): Secondary | ICD-10-CM | POA: Diagnosis not present

## 2018-08-25 DIAGNOSIS — M1990 Unspecified osteoarthritis, unspecified site: Secondary | ICD-10-CM | POA: Diagnosis not present

## 2018-08-25 DIAGNOSIS — R69 Illness, unspecified: Secondary | ICD-10-CM | POA: Diagnosis not present

## 2018-08-25 DIAGNOSIS — E1165 Type 2 diabetes mellitus with hyperglycemia: Secondary | ICD-10-CM | POA: Diagnosis not present

## 2018-08-25 DIAGNOSIS — E1122 Type 2 diabetes mellitus with diabetic chronic kidney disease: Secondary | ICD-10-CM | POA: Diagnosis not present

## 2018-08-25 DIAGNOSIS — E785 Hyperlipidemia, unspecified: Secondary | ICD-10-CM | POA: Diagnosis not present

## 2018-08-25 DIAGNOSIS — M810 Age-related osteoporosis without current pathological fracture: Secondary | ICD-10-CM | POA: Diagnosis not present

## 2018-08-25 DIAGNOSIS — E039 Hypothyroidism, unspecified: Secondary | ICD-10-CM | POA: Diagnosis not present

## 2018-08-25 DIAGNOSIS — E119 Type 2 diabetes mellitus without complications: Secondary | ICD-10-CM | POA: Diagnosis not present

## 2018-08-25 DIAGNOSIS — I10 Essential (primary) hypertension: Secondary | ICD-10-CM | POA: Diagnosis not present

## 2018-08-25 DIAGNOSIS — M47814 Spondylosis without myelopathy or radiculopathy, thoracic region: Secondary | ICD-10-CM | POA: Diagnosis not present

## 2018-08-27 DIAGNOSIS — E039 Hypothyroidism, unspecified: Secondary | ICD-10-CM | POA: Diagnosis not present

## 2018-08-30 DIAGNOSIS — Z794 Long term (current) use of insulin: Secondary | ICD-10-CM | POA: Diagnosis not present

## 2018-08-30 DIAGNOSIS — E049 Nontoxic goiter, unspecified: Secondary | ICD-10-CM | POA: Diagnosis not present

## 2018-08-30 DIAGNOSIS — M810 Age-related osteoporosis without current pathological fracture: Secondary | ICD-10-CM | POA: Diagnosis not present

## 2018-08-30 DIAGNOSIS — E039 Hypothyroidism, unspecified: Secondary | ICD-10-CM | POA: Diagnosis not present

## 2018-08-30 DIAGNOSIS — G5601 Carpal tunnel syndrome, right upper limb: Secondary | ICD-10-CM | POA: Diagnosis not present

## 2018-08-30 DIAGNOSIS — E119 Type 2 diabetes mellitus without complications: Secondary | ICD-10-CM | POA: Diagnosis not present

## 2018-09-23 DIAGNOSIS — G5600 Carpal tunnel syndrome, unspecified upper limb: Secondary | ICD-10-CM | POA: Diagnosis not present

## 2018-09-23 DIAGNOSIS — M549 Dorsalgia, unspecified: Secondary | ICD-10-CM | POA: Diagnosis not present

## 2018-09-23 DIAGNOSIS — M19011 Primary osteoarthritis, right shoulder: Secondary | ICD-10-CM | POA: Diagnosis not present

## 2018-09-23 DIAGNOSIS — G894 Chronic pain syndrome: Secondary | ICD-10-CM | POA: Diagnosis not present

## 2018-10-06 DIAGNOSIS — R69 Illness, unspecified: Secondary | ICD-10-CM | POA: Diagnosis not present

## 2018-10-12 DIAGNOSIS — E039 Hypothyroidism, unspecified: Secondary | ICD-10-CM | POA: Diagnosis not present

## 2018-10-12 DIAGNOSIS — E119 Type 2 diabetes mellitus without complications: Secondary | ICD-10-CM | POA: Diagnosis not present

## 2018-10-21 DIAGNOSIS — M549 Dorsalgia, unspecified: Secondary | ICD-10-CM | POA: Diagnosis not present

## 2018-10-21 DIAGNOSIS — G894 Chronic pain syndrome: Secondary | ICD-10-CM | POA: Diagnosis not present

## 2018-10-21 DIAGNOSIS — M19011 Primary osteoarthritis, right shoulder: Secondary | ICD-10-CM | POA: Diagnosis not present

## 2018-10-21 DIAGNOSIS — G5600 Carpal tunnel syndrome, unspecified upper limb: Secondary | ICD-10-CM | POA: Diagnosis not present

## 2018-10-21 DIAGNOSIS — Z79899 Other long term (current) drug therapy: Secondary | ICD-10-CM | POA: Diagnosis not present

## 2018-10-21 DIAGNOSIS — Z79891 Long term (current) use of opiate analgesic: Secondary | ICD-10-CM | POA: Diagnosis not present

## 2018-10-26 DIAGNOSIS — E559 Vitamin D deficiency, unspecified: Secondary | ICD-10-CM | POA: Diagnosis not present

## 2018-10-26 DIAGNOSIS — R69 Illness, unspecified: Secondary | ICD-10-CM | POA: Diagnosis not present

## 2018-10-26 DIAGNOSIS — E039 Hypothyroidism, unspecified: Secondary | ICD-10-CM | POA: Diagnosis not present

## 2018-10-26 DIAGNOSIS — Z720 Tobacco use: Secondary | ICD-10-CM | POA: Diagnosis not present

## 2018-10-26 DIAGNOSIS — I509 Heart failure, unspecified: Secondary | ICD-10-CM | POA: Diagnosis not present

## 2018-10-26 DIAGNOSIS — M810 Age-related osteoporosis without current pathological fracture: Secondary | ICD-10-CM | POA: Diagnosis not present

## 2018-10-26 DIAGNOSIS — Z0000 Encounter for general adult medical examination without abnormal findings: Secondary | ICD-10-CM | POA: Diagnosis not present

## 2018-10-26 DIAGNOSIS — I10 Essential (primary) hypertension: Secondary | ICD-10-CM | POA: Diagnosis not present

## 2018-10-26 DIAGNOSIS — G894 Chronic pain syndrome: Secondary | ICD-10-CM | POA: Diagnosis not present

## 2018-10-26 DIAGNOSIS — K5090 Crohn's disease, unspecified, without complications: Secondary | ICD-10-CM | POA: Diagnosis not present

## 2018-11-04 IMAGING — CR DG LUMBAR SPINE COMPLETE 4+V
5 series · 5 of 5 positions shown · non-contrast
Comparison: Lumbar radiographs 08/26/2013

CLINICAL DATA: Low back pain

EXAM:
LUMBAR SPINE - COMPLETE 4+ VIEW

[w lumbar spine ap]
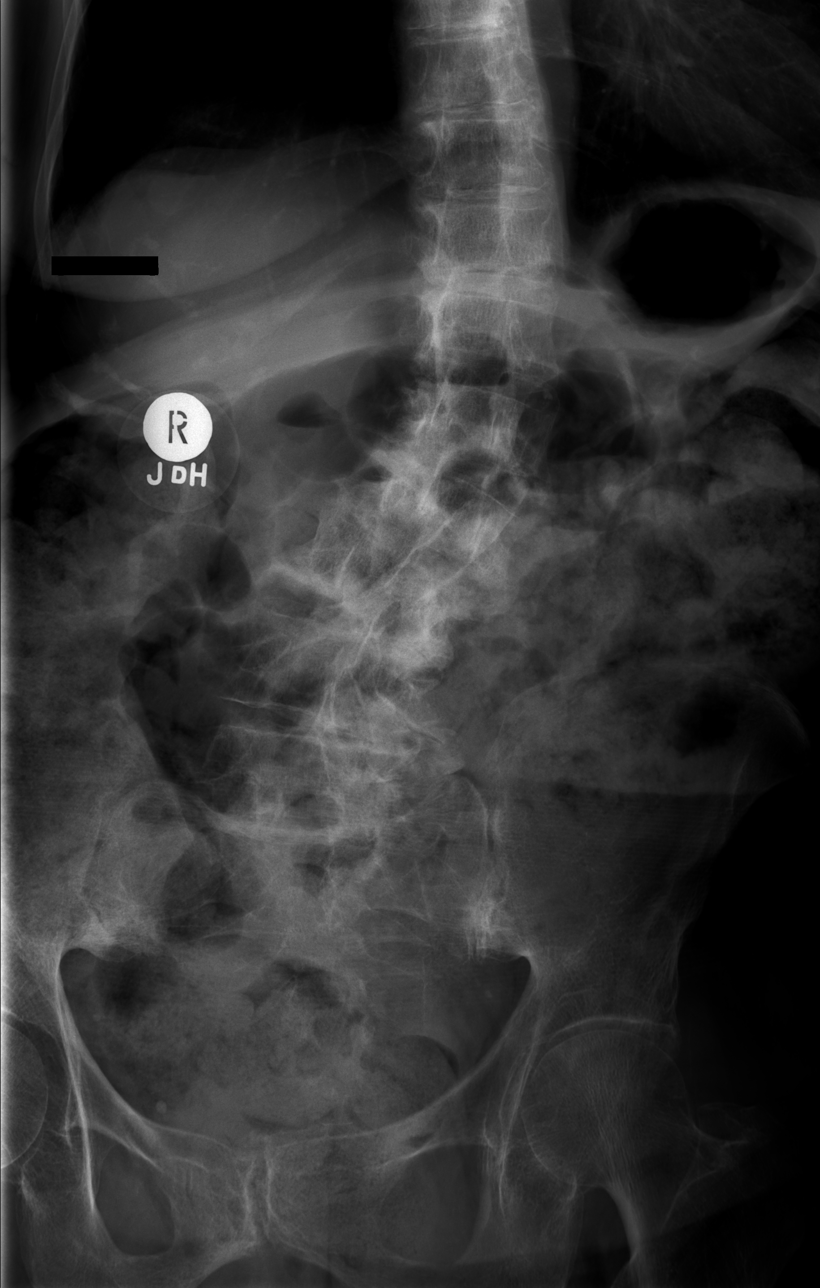

[w lumbar spine obl (1 of 2)]
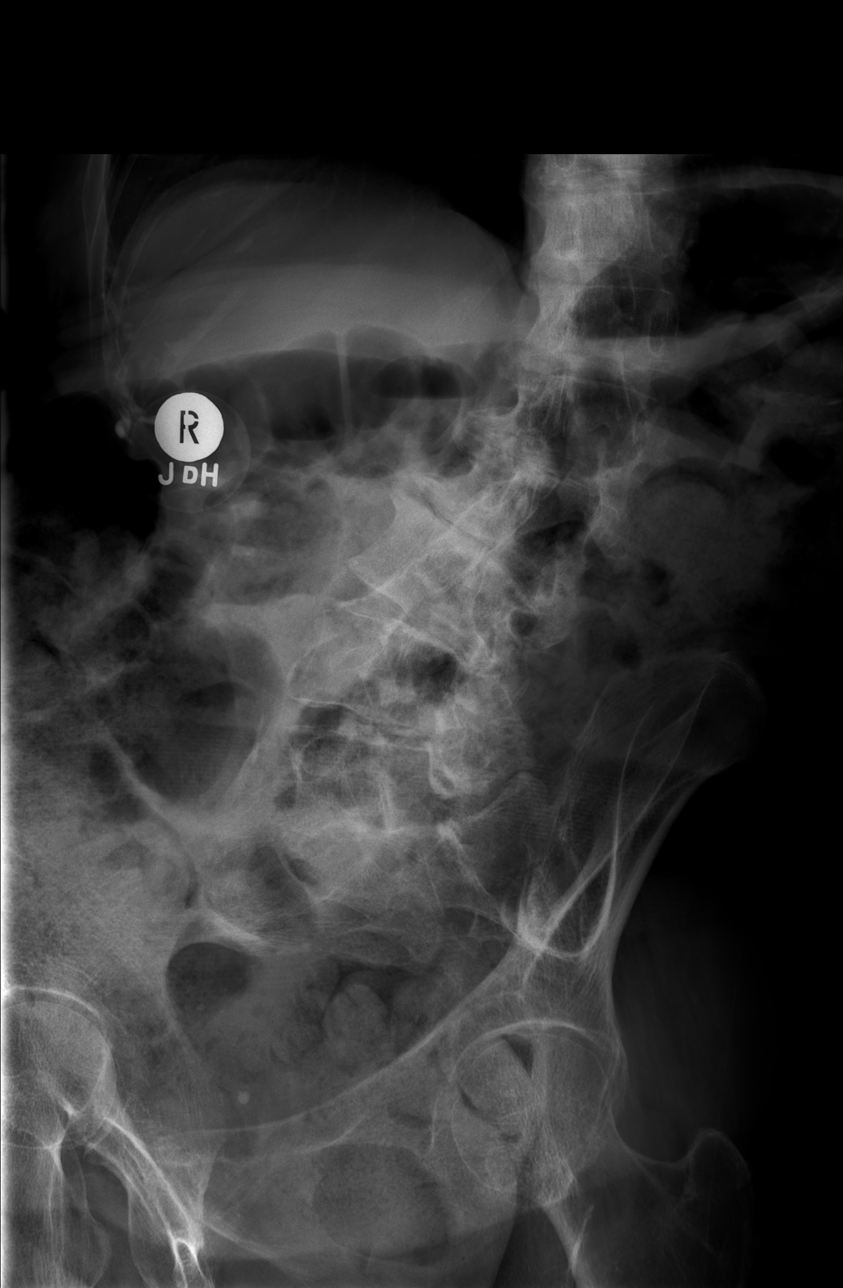

[w lumbar spine obl (2 of 2)]
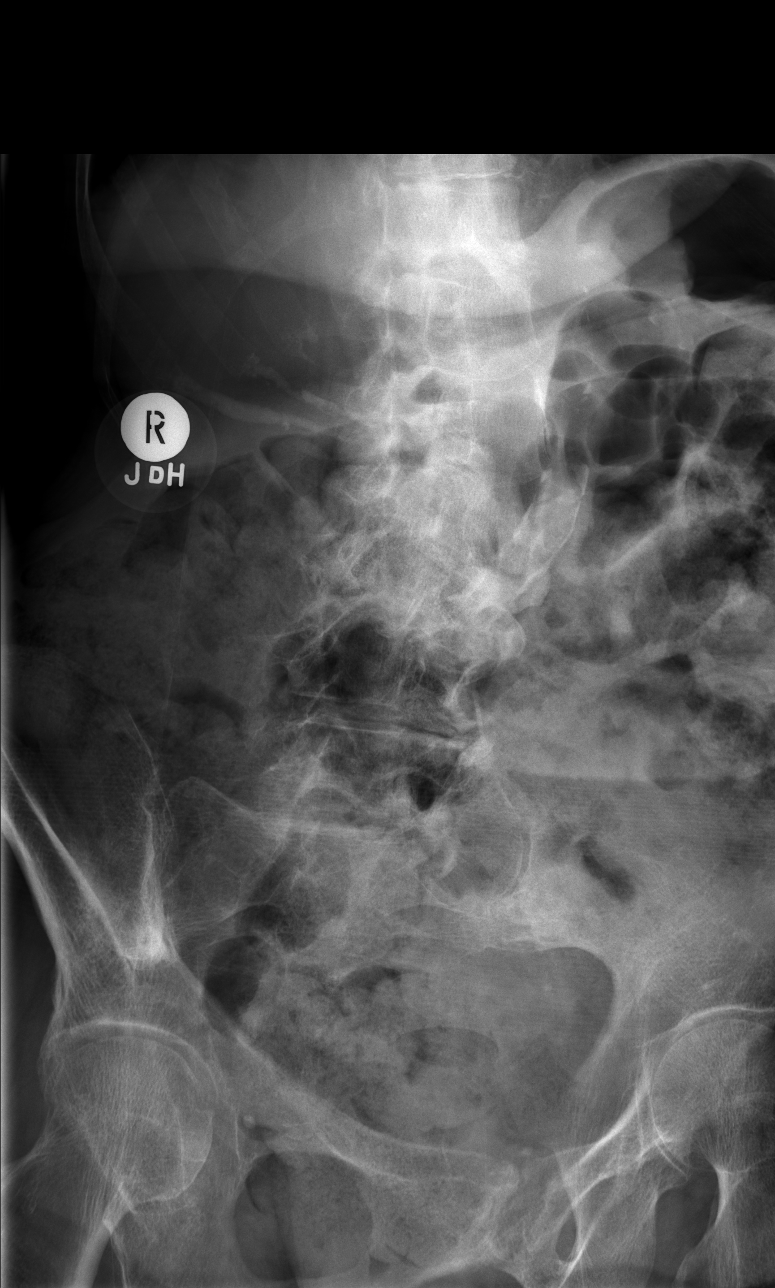

[w lumbar spine lat]
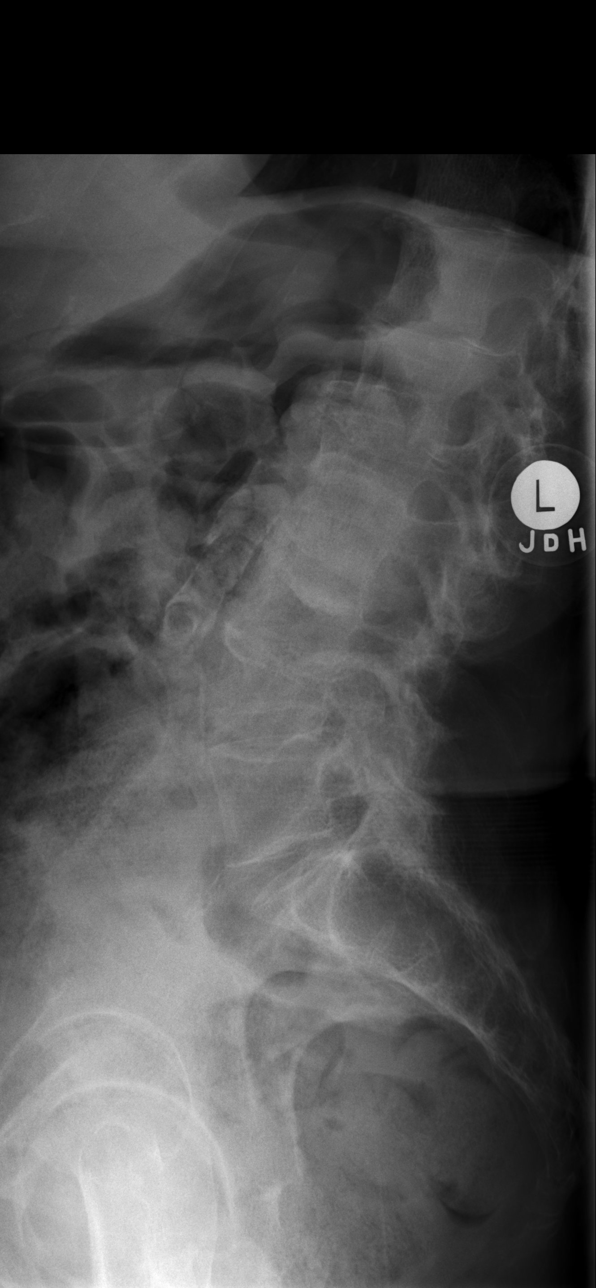

[w lumbar l-5 s-1 spot]
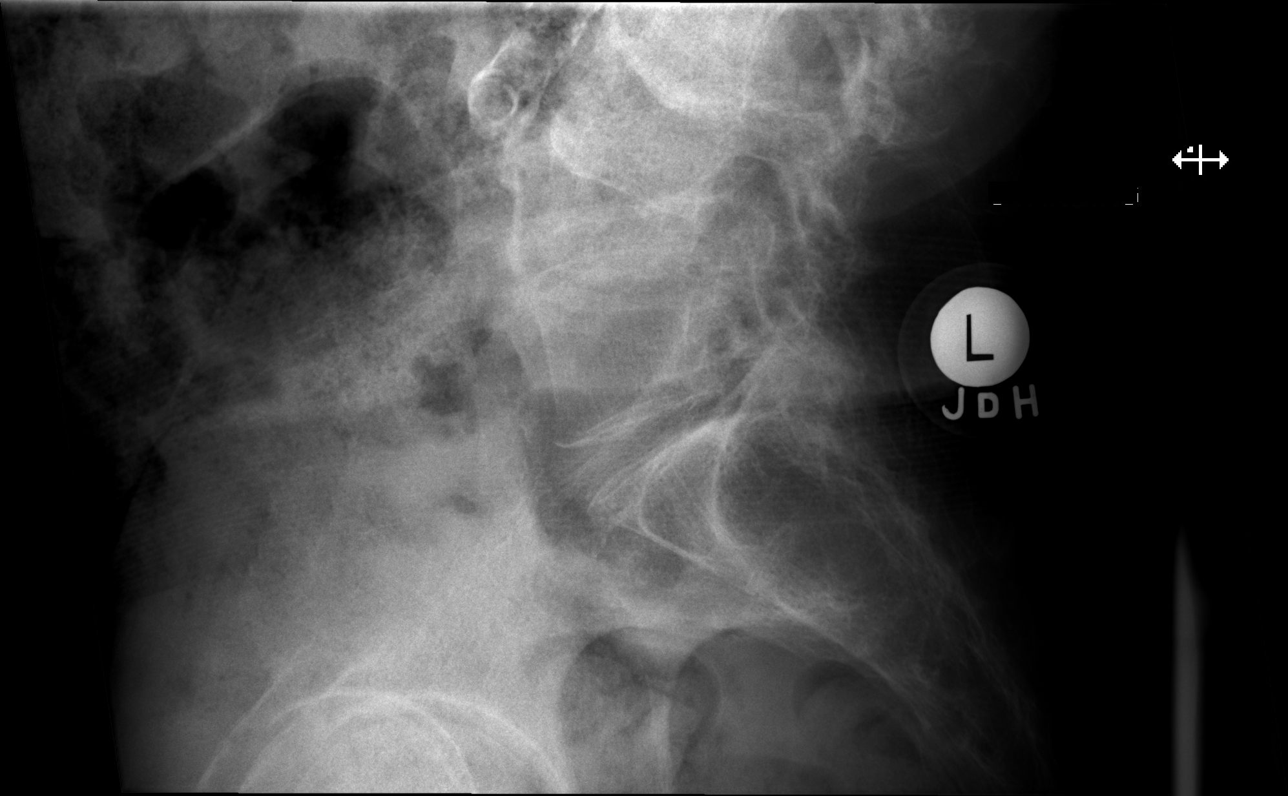

[5 of 5 positions shown; findings below may reference images not displayed]

FINDINGS: Moderate scoliosis with mild progression from the prior study.
Multilevel disc degeneration and spurring most prominent on the left
at L3-4 and L4-5. Disc degeneration and spurring on the right at
L1-2 and L2-3.

Negative for fracture.

Degenerative change in the SI joints bilaterally. Atherosclerotic
aorta.
IMPRESSION: Progressive lumbar scoliosis.  Multilevel disc degeneration

## 2018-11-10 ENCOUNTER — Ambulatory Visit: Payer: Medicare HMO | Admitting: Podiatry

## 2018-11-10 ENCOUNTER — Encounter

## 2018-11-10 ENCOUNTER — Encounter: Payer: Self-pay | Admitting: Podiatry

## 2018-11-10 ENCOUNTER — Other Ambulatory Visit: Payer: Self-pay

## 2018-11-10 DIAGNOSIS — M79675 Pain in left toe(s): Secondary | ICD-10-CM

## 2018-11-10 DIAGNOSIS — M79674 Pain in right toe(s): Secondary | ICD-10-CM

## 2018-11-10 DIAGNOSIS — L84 Corns and callosities: Secondary | ICD-10-CM | POA: Diagnosis not present

## 2018-11-10 DIAGNOSIS — E1142 Type 2 diabetes mellitus with diabetic polyneuropathy: Secondary | ICD-10-CM | POA: Diagnosis not present

## 2018-11-10 DIAGNOSIS — B351 Tinea unguium: Secondary | ICD-10-CM | POA: Diagnosis not present

## 2018-11-10 NOTE — Patient Instructions (Signed)
Diabetes Mellitus and Foot Care Foot care is an important part of your health, especially when you have diabetes. Diabetes may cause you to have problems because of poor blood flow (circulation) to your feet and legs, which can cause your skin to:  Become thinner and drier.  Break more easily.  Heal more slowly.  Peel and crack. You may also have nerve damage (neuropathy) in your legs and feet, causing decreased feeling in them. This means that you may not notice minor injuries to your feet that could lead to more serious problems. Noticing and addressing any potential problems early is the best way to prevent future foot problems. How to care for your feet Foot hygiene  Wash your feet daily with warm water and mild soap. Do not use hot water. Then, pat your feet and the areas between your toes until they are completely dry. Do not soak your feet as this can dry your skin.  Trim your toenails straight across. Do not dig under them or around the cuticle. File the edges of your nails with an emery board or nail file.  Apply a moisturizing lotion or petroleum jelly to the skin on your feet and to dry, brittle toenails. Use lotion that does not contain alcohol and is unscented. Do not apply lotion between your toes. Shoes and socks  Wear clean socks or stockings every day. Make sure they are not too tight. Do not wear knee-high stockings since they may decrease blood flow to your legs.  Wear shoes that fit properly and have enough cushioning. Always look in your shoes before you put them on to be sure there are no objects inside.  To break in new shoes, wear them for just a few hours a day. This prevents injuries on your feet. Wounds, scrapes, corns, and calluses  Check your feet daily for blisters, cuts, bruises, sores, and redness. If you cannot see the bottom of your feet, use a mirror or ask someone for help.  Do not cut corns or calluses or try to remove them with medicine.  If you  find a minor scrape, cut, or break in the skin on your feet, keep it and the skin around it clean and dry. You may clean these areas with mild soap and water. Do not clean the area with peroxide, alcohol, or iodine.  If you have a wound, scrape, corn, or callus on your foot, look at it several times a day to make sure it is healing and not infected. Check for: ? Redness, swelling, or pain. ? Fluid or blood. ? Warmth. ? Pus or a bad smell. General instructions  Do not cross your legs. This may decrease blood flow to your feet.  Do not use heating pads or hot water bottles on your feet. They may burn your skin. If you have lost feeling in your feet or legs, you may not know this is happening until it is too late.  Protect your feet from hot and cold by wearing shoes, such as at the beach or on hot pavement.  Schedule a complete foot exam at least once a year (annually) or more often if you have foot problems. If you have foot problems, report any cuts, sores, or bruises to your health care provider immediately. Contact a health care provider if:  You have a medical condition that increases your risk of infection and you have any cuts, sores, or bruises on your feet.  You have an injury that is not   healing.  You have redness on your legs or feet.  You feel burning or tingling in your legs or feet.  You have pain or cramps in your legs and feet.  Your legs or feet are numb.  Your feet always feel cold.  You have pain around a toenail. Get help right away if:  You have a wound, scrape, corn, or callus on your foot and: ? You have pain, swelling, or redness that gets worse. ? You have fluid or blood coming from the wound, scrape, corn, or callus. ? Your wound, scrape, corn, or callus feels warm to the touch. ? You have pus or a bad smell coming from the wound, scrape, corn, or callus. ? You have a fever. ? You have a red line going up your leg. Summary  Check your feet every day  for cuts, sores, red spots, swelling, and blisters.  Moisturize feet and legs daily.  Wear shoes that fit properly and have enough cushioning.  If you have foot problems, report any cuts, sores, or bruises to your health care provider immediately.  Schedule a complete foot exam at least once a year (annually) or more often if you have foot problems. This information is not intended to replace advice given to you by your health care provider. Make sure you discuss any questions you have with your health care provider. Document Released: 01/11/2000 Document Revised: 02/25/2017 Document Reviewed: 02/15/2016 Elsevier Patient Education  2020 [employer]  

## 2018-11-14 NOTE — Progress Notes (Signed)
Subjective:  Tammie West presents to clinic today for painful corns b/l 5th digits and for painful, thick, discolored, elongated toenails 1-5 b/l that become tender and cannot cut because of thickness.Pain is aggravated when wearing enclosed shoe gear.  She relates pain when ambulating with and without shoe gear.  [name], MD is her PCP.  Current Outpatient Medications on File Prior to Visit  Medication Sig Dispense Refill  . hyoscyamine (LEVBID) 0.375 MG 12 hr tablet Take by mouth.    . Accu-Chek FastClix Lancets MISC USE 1 LANCET TO CHECK BLOOD SUGAR 3 TIMES DAILY FOR TYPE 2 DIABETES MELLITUS ON INSULIN    . ACCU-CHEK SMARTVIEW test strip USE TO CHECK BLOOD SUGAR THREE TIMES DAILY    . amitriptyline (ELAVIL) 25 MG tablet Take 25 mg by mouth at bedtime.    Marland Kitchen amLODipine (NORVASC) 5 MG tablet Take 5 mg by mouth daily.    Marland Kitchen aspirin EC 81 MG tablet Take 1 tablet (81 mg total) by mouth daily.    . BD PEN NEEDLE NANO U/F 32G X 4 MM MISC     . clidinium-chlordiazePOXIDE (LIBRAX) 5-2.5 MG capsule Take 1 capsule by mouth 2 (two) times daily.    Marland Kitchen doxycycline (VIBRA-TABS) 100 MG tablet Take 1 tablet (100 mg total) by mouth 2 (two) times daily. 14 tablet 0  . EMBEDA 20-0.8 MG CPCR     . escitalopram (LEXAPRO) 10 MG tablet Take 10 mg by mouth daily.    . EUTHYROX 75 MCG tablet TAKE ONE TABLET BY MOUTH 6 DAYS A WEEK BUT ONLY ONE HALF A TABLET ON SUNDAYS    . famotidine (PEPCID) 20 MG tablet TAKE 1 TABLET BY MOUTH ONCE DAILY AT BEDTIME AS NEEDED FOR 90 DAYS    . furosemide (LASIX) 40 MG tablet Take 1 tablet (40 mg total) by mouth every other day AND 0.5 tablets (20 mg total) every other day. 45 tablet 3  . insulin glargine (LANTUS) 100 unit/mL SOPN Inject 3 Units into the skin daily after breakfast.     . levothyroxine (SYNTHROID) 88 MCG tablet TAKE 1 TABLET BY MOUTH ONCE DAILY ON AN EMPTY STOMACH IN THE MORNING    . lidocaine (XYLOCAINE) 5 % ointment Apply QID to affected area  as needed for pain.    Marland Kitchen lisinopril (ZESTRIL) 5 MG tablet     . mesalamine (LIALDA) 1.2 G EC tablet Take 2.4 g by mouth daily after breakfast.    . MORPHABOND ER 15 MG T12A     . nitroGLYCERIN (NITROSTAT) 0.4 MG SL tablet Place 1 tablet (0.4 mg total) under the tongue every 5 (five) minutes as needed for chest pain. 25 tablet 3  . Oxycodone HCl 10 MG TABS Take 10 mg by mouth 2 (two) times daily.     . polyethylene glycol (MIRALAX / GLYCOLAX) packet Take 17 g by mouth daily as needed for mild constipation.    . Probiotic Product (ALIGN PO) Take 1 capsule by mouth daily after breakfast.    . ranitidine (ZANTAC) 150 MG capsule Take 1 capsule by mouth daily.    . simvastatin (ZOCOR) 10 MG tablet Take 10 mg by mouth at bedtime.      No current facility-administered medications on file prior to visit.      Allergies  Allergen Reactions  . Atorvastatin Nausea And Vomiting  . Byetta 10 Mcg Pen [Exenatide] Nausea And Vomiting  . Cefaclor Other (See Comments)    Pt does not remember the  reaction  . Codeine Nausea And Vomiting       . Crestor [Rosuvastatin] Nausea And Vomiting  . Dexilant [Dexlansoprazole] Diarrhea  . Erythromycin Hives and Swelling  . Fosamax [Alendronate Sodium] Other (See Comments)    GI intolerance  . Macrobid [employer] Macro] Other (See Comments)    Pt does not remember the reaction  . Metformin And Related Nausea And Vomiting  . Nsaids     Elevated creatinine   . Penicillins Hives and Swelling    Has patient had a PCN reaction causing immediate rash, facial/tongue/throat swelling, SOB or lightheadedness with hypotension: Yes Has patient had a PCN reaction causing severe rash involving mucus membranes or skin necrosis: No Has patient had a PCN reaction that required hospitalization No Has patient had a PCN reaction occurring within the last 10 years: No If all of the above answers are "NO", then may proceed with Cephalosporin use.  . Tolmetin Other (See  Comments)    Elevated creatinine   . Shellfish Allergy Nausea And Vomiting, Other (See Comments) and Rash    Felt spaced out Felt spaced out Felt spaced out     Objective: There were no vitals filed for this visit.  Physical Examination:  Vascular Examination: Capillary refill time immediate x 10 digits.  Palpable DP/PT pulses b/l.  Digital hair absent b/l.  No edema noted b/l.  Skin temperature gradient WNL b/l.  Dermatological Examination: Skin with normal turgor, texture and tone b/l.  No open wounds b/l.  No interdigital macerations noted b/l.  Elongated, thick, discolored brittle toenails with subungual debris and pain on dorsal palpation of nailbeds 1-5 b/l.  Hyperkeratotic lesions b/l 5th digits with tenderness to palpation. No erythema, no edema, no drainage, no flocculence noted.  Musculoskeletal Examination: Muscle strength 5/5 to all muscle groups b/l.  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  No pain, crepitus or joint discomfort with active/passive ROM.  Neurological Examination: Sensation diminished b/l with 10 gram monofilament.  Assessment: Mycotic nail infection with pain 1-5 b/l Corns b/l 5th digits NIDDM  Plan: 1. Toenails 1-5 b/l were debrided in length and girth without iatrogenic laceration. 2.  Continue soft, supportive shoe gear daily. 3.  Report any pedal injuries to medical professional. 4.  Follow up 9 weeks. 5.  Patient/POA to call should there be a question/concern in there interim.

## 2018-11-18 DIAGNOSIS — G894 Chronic pain syndrome: Secondary | ICD-10-CM | POA: Diagnosis not present

## 2018-11-18 DIAGNOSIS — G5600 Carpal tunnel syndrome, unspecified upper limb: Secondary | ICD-10-CM | POA: Diagnosis not present

## 2018-11-18 DIAGNOSIS — M19011 Primary osteoarthritis, right shoulder: Secondary | ICD-10-CM | POA: Diagnosis not present

## 2018-11-18 DIAGNOSIS — M1990 Unspecified osteoarthritis, unspecified site: Secondary | ICD-10-CM | POA: Diagnosis not present

## 2018-12-13 ENCOUNTER — Telehealth: Payer: Self-pay | Admitting: *Deleted

## 2018-12-13 ENCOUNTER — Ambulatory Visit (INDEPENDENT_AMBULATORY_CARE_PROVIDER_SITE_OTHER): Payer: Medicare HMO

## 2018-12-13 ENCOUNTER — Other Ambulatory Visit: Payer: Self-pay

## 2018-12-13 ENCOUNTER — Ambulatory Visit: Payer: Medicare HMO | Admitting: Podiatry

## 2018-12-13 ENCOUNTER — Encounter: Payer: Self-pay | Admitting: Podiatry

## 2018-12-13 DIAGNOSIS — M7751 Other enthesopathy of right foot: Secondary | ICD-10-CM | POA: Diagnosis not present

## 2018-12-13 DIAGNOSIS — M79675 Pain in left toe(s): Secondary | ICD-10-CM

## 2018-12-13 DIAGNOSIS — E1142 Type 2 diabetes mellitus with diabetic polyneuropathy: Secondary | ICD-10-CM

## 2018-12-13 DIAGNOSIS — L89610 Pressure ulcer of right heel, unstageable: Secondary | ICD-10-CM | POA: Diagnosis not present

## 2018-12-13 DIAGNOSIS — L84 Corns and callosities: Secondary | ICD-10-CM

## 2018-12-13 DIAGNOSIS — R2241 Localized swelling, mass and lump, right lower limb: Secondary | ICD-10-CM | POA: Diagnosis not present

## 2018-12-13 DIAGNOSIS — M7750 Other enthesopathy of unspecified foot: Secondary | ICD-10-CM

## 2018-12-13 DIAGNOSIS — B351 Tinea unguium: Secondary | ICD-10-CM | POA: Diagnosis not present

## 2018-12-13 DIAGNOSIS — M79674 Pain in right toe(s): Secondary | ICD-10-CM

## 2018-12-13 MED ORDER — DOXYCYCLINE HYCLATE 100 MG PO TABS: 100.0000 mg | ORAL_TABLET | Freq: Two times a day (BID) | ORAL | 0 refills | Status: DC

## 2018-12-13 MED ORDER — MUPIROCIN 2 % EX OINT: TOPICAL_OINTMENT | CUTANEOUS | 0 refills | Status: AC

## 2018-12-13 NOTE — Telephone Encounter (Signed)
Left message requesting Venous doppler rule out DVT right lower leg, swollen, hot and firm to touch, painful to be scheduled today or tomorrow. Faxed orders to [facility].

## 2018-12-13 NOTE — Telephone Encounter (Signed)
I informed pt of Max and to call to schedule at her convenience today or tomorrow.

## 2018-12-13 NOTE — Patient Instructions (Signed)

## 2018-12-14 ENCOUNTER — Ambulatory Visit (HOSPITAL_COMMUNITY)
Admission: RE | Admit: 2018-12-14 | Discharge: 2018-12-14 | Disposition: A | Discharge status: Home / Self Care | Payer: Medicare HMO | Source: Ambulatory Visit | Attending: Cardiovascular Disease | Admitting: Cardiovascular Disease

## 2018-12-14 DIAGNOSIS — R2241 Localized swelling, mass and lump, right lower limb: Secondary | ICD-10-CM | POA: Diagnosis not present

## 2018-12-14 NOTE — Progress Notes (Signed)
Subjective: Tammie West is seen today for routine diabetic foot care follow up painful corn right 5th digit and elongated, thickened toenails  b/l feet that she cannot cut. Pain interferes with daily activities. Aggravating factor includes wearing enclosed shoe gear and relieved with periodic debridement.   She relates new problem of right heel pain and right LE swelling and right leg tenderness. She states right leg has been tender for the past 8 days or so.  She denies any trauma or falls that preceded her onset of symptoms. She has done nothing to treat her condition.  She states she has not walked barefoot and she has only been wearing her house slippers as always around the house. Denies any fever, chills, night sweats, nausea or vomiting.  She states she sleeps on her left side when falling asleep, but when she awakes in the mornings, she is on her back.  She continues to be an everyday smoker stating she smokes about three cigarettes/day now. She has smoked for several decades.   Current Outpatient Medications on File Prior to Visit  Medication Sig  . Accu-Chek FastClix Lancets MISC USE 1 LANCET TO CHECK BLOOD SUGAR 3 TIMES DAILY FOR TYPE 2 DIABETES MELLITUS ON INSULIN  . ACCU-CHEK SMARTVIEW test strip USE TO CHECK BLOOD SUGAR THREE TIMES DAILY  . amitriptyline (ELAVIL) 25 MG tablet Take 25 mg by mouth at bedtime.  Marland Kitchen amLODipine (NORVASC) 5 MG tablet Take 5 mg by mouth daily.  Marland Kitchen aspirin EC 81 MG tablet Take 1 tablet (81 mg total) by mouth daily.  . BD PEN NEEDLE NANO U/F 32G X 4 MM MISC   . clidinium-chlordiazePOXIDE (LIBRAX) 5-2.5 MG capsule Take 1 capsule by mouth 2 (two) times daily.  . diclofenac Sodium (VOLTAREN) 1 % GEL diclofenac 1 % topical gel  . EMBEDA 20-0.8 MG CPCR   . escitalopram (LEXAPRO) 10 MG tablet Take 10 mg by mouth daily.  . EUTHYROX 75 MCG tablet TAKE ONE TABLET BY MOUTH 6 DAYS A WEEK BUT ONLY ONE HALF A TABLET ON SUNDAYS  . famotidine (PEPCID) 20 MG tablet  TAKE 1 TABLET BY MOUTH ONCE DAILY AT BEDTIME AS NEEDED FOR 90 DAYS  . furosemide (LASIX) 40 MG tablet Take 1 tablet (40 mg total) by mouth every other day AND 0.5 tablets (20 mg total) every other day.  . hyoscyamine (LEVBID) 0.375 MG 12 hr tablet Take by mouth.  . insulin glargine (LANTUS) 100 unit/mL SOPN Inject 3 Units into the skin daily after breakfast.   . levothyroxine (SYNTHROID) 88 MCG tablet TAKE 1 TABLET BY MOUTH ONCE DAILY ON AN EMPTY STOMACH IN THE MORNING  . lidocaine (XYLOCAINE) 5 % ointment Apply QID to affected area as needed for pain.  Marland Kitchen lisinopril (ZESTRIL) 5 MG tablet   . mesalamine (LIALDA) 1.2 G EC tablet Take 2.4 g by mouth daily after breakfast.  . MORPHABOND ER 15 MG T12A   . nitroGLYCERIN (NITROSTAT) 0.4 MG SL tablet Place 1 tablet (0.4 mg total) under the tongue every 5 (five) minutes as needed for chest pain.  . Oxycodone HCl 10 MG TABS Take 10 mg by mouth 2 (two) times daily.   . polyethylene glycol (MIRALAX / GLYCOLAX) packet Take 17 g by mouth daily as needed for mild constipation.  . Probiotic Product (ALIGN PO) Take 1 capsule by mouth daily after breakfast.  . ranitidine (ZANTAC) 150 MG capsule Take 1 capsule by mouth daily.  . simvastatin (ZOCOR) 10 MG tablet Take 10  mg by mouth at bedtime.    No current facility-administered medications on file prior to visit.      Allergies  Allergen Reactions  . Atorvastatin Nausea And Vomiting  . Byetta 10 Mcg Pen [Exenatide] Nausea And Vomiting  . Cefaclor Other (See Comments)    Pt does not remember the reaction  . Codeine Nausea And Vomiting       . Crestor [Rosuvastatin] Nausea And Vomiting  . Dexilant [Dexlansoprazole] Diarrhea  . Erythromycin Hives and Swelling  . Fosamax [Alendronate Sodium] Other (See Comments)    GI intolerance  . Macrobid [employer] Macro] Other (See Comments)    Pt does not remember the reaction  . Metformin And Related Nausea And Vomiting  . Nsaids     Elevated  creatinine   . Penicillins Hives and Swelling    Has patient had a PCN reaction causing immediate rash, facial/tongue/throat swelling, SOB or lightheadedness with hypotension: Yes Has patient had a PCN reaction causing severe rash involving mucus membranes or skin necrosis: No Has patient had a PCN reaction that required hospitalization No Has patient had a PCN reaction occurring within the last 10 years: No If all of the above answers are "NO", then may proceed with Cephalosporin use.  . Tolmetin Other (See Comments)    Elevated creatinine   . Shellfish Allergy Nausea And Vomiting, Other (See Comments) and Rash    Felt spaced out Felt spaced out Felt spaced out    Objective: 85 y.o. Caucasian female, thin build, in NAD. AAO x 3.   Vascular Examination: Capillary refill time immediate x 10 digits.  Dorsalis pedis present b/l.  Posterior tibial pulses present b/l.  Digital hair absent b/l.  Skin temperature gradient warm to cool b/l.  Unilateral edema noted right leg when compared to contralateral limb. There is tenderness to touch. +Calf tenderness right LE.     Dermatological Examination: Skin with normal turgor, texture and tone b/l  Toenails 1-5 b/l discolored, thick, dystrophic with subungual debris and pain with palpation to nailbeds due to thickness of nails.  Incurvated nailplate right great toe medial border with tenderness to palpation. No erythema, no drainage, no flocculence.  +Decubitus ulceration posterior heel with mild erythema. No blistering. No abscess. +Pain on palpation.  Musculoskeletal: Muscle strength testing deferred today.   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   Xrays right LE: No gas in tissues + Posterior calcaneal spur + Plantar calcaneal spur +Haglund's deformity  Assessment: Painful onychomycosis toenails 1-5 b/l  Corn right 5th  digit Ingrown toenail right great toe, noninfected Pressure ulcer posterior heel right LE, unstageable with cellulitis Rule out DVT RLE NIDDM with neuropathy  Plan: 1. Discussed possibility of DVT with patient as she has risk factors of smoking and sedentary. Recommended [facility] ED visit, but patient declined and agreed to go to [facility]. Order sent for venous doppler to rule out DVT right LE. If DVT confirmed, she was informed she would need to go to hospital for admission. 2. For decubitus ulceration, heel pillow dispensed and she is to wear when she is in bed for pressure relief. Call office if condition worsens. Report to ED with fever, chills, night sweats, nausea, vomiting,  increased redness, drainage, swelling or odor from heel. 3. Order written for Doxycycline 100 mg tabs. Take 1 tablet (100 mg total) by mouth 2 (two) times daily for one week. Starting Mon 12/13/2018.  4. Prescription written  for Mupirocin Ointment. Patient is to apply to right heel once daily and cover with dressing.  5. Toenails 1-5 b/l were debrided in length and girth without iatrogenic bleeding. Offending nail border debrided and curretaged right hallux. Border cleansed with alcohol and triple antibiotic applied. No further treatment required by patient/caregiver. 6. Corn right 5th digit pared utilizing sterile scalpel blade without incident. 7. Patient to report any pedal injuries to medical professional immediately. 8. Follow up 2 weeks with Dr.Wagoner for re-evaluation right heel decubitus ulceration with cellulitis.  9. Patient/POA to call should there be a concern in the interim.

## 2018-12-15 ENCOUNTER — Telehealth: Payer: Self-pay | Admitting: *Deleted

## 2018-12-15 NOTE — Telephone Encounter (Addendum)
I spoke with [facility] at [facility] and asked if Dr. Fara Olden was a physician there and she stated she was and I asked for the fax. Hassan Rowan states [phone]. Faxed LOV 12/13/2018 and doppler of 12/14/2018 to Dr. Fara Olden.

## 2018-12-15 NOTE — Telephone Encounter (Signed)
Pt's dtr, [name] states pt's land line is down and gave the phone to the pt. I informed pt of Dr. Heber Marissa review of results and recommendation to go to PCP. Pt states understanding.

## 2018-12-15 NOTE — Telephone Encounter (Signed)
Dr. Elisha Ponder states pt's venous doppler is negative for DVT, but there is an avascular lesion, and pt needs to discuss with her PCP. Unable to leave a message on pt's home phone, there was no answering service. Left message requesting a call back to discuss results.

## 2018-12-16 NOTE — Telephone Encounter (Signed)
I called Tammie West and informed I had confirmation the fax of the doppler results and clinicals had been received and I offered to email to her. Tammie West requested email to [email or web address]. Emailed results and LOV 12/13/2018 requested dtr's email.

## 2018-12-16 NOTE — Telephone Encounter (Signed)
Pt's dtr, [name] states she called pt's Dr. Fara Olden office and Asencion Partridge stated they did not get the study results. [name] states pt is very upset, her B/P is up and her sugar is up, pt had an appt with her endocrinologist - Dr. Buddy Duty today he is in the same office as Dr. Fara Olden, and she wants to know where the studies are and if she needs to come get.

## 2018-12-16 NOTE — Telephone Encounter (Signed)
I called [name] and confirmed she had received the email.

## 2018-12-17 DIAGNOSIS — M549 Dorsalgia, unspecified: Secondary | ICD-10-CM | POA: Diagnosis not present

## 2018-12-17 DIAGNOSIS — M19011 Primary osteoarthritis, right shoulder: Secondary | ICD-10-CM | POA: Diagnosis not present

## 2018-12-17 DIAGNOSIS — E119 Type 2 diabetes mellitus without complications: Secondary | ICD-10-CM | POA: Diagnosis not present

## 2018-12-17 DIAGNOSIS — E039 Hypothyroidism, unspecified: Secondary | ICD-10-CM | POA: Diagnosis not present

## 2018-12-17 DIAGNOSIS — Z794 Long term (current) use of insulin: Secondary | ICD-10-CM | POA: Diagnosis not present

## 2018-12-17 DIAGNOSIS — G5600 Carpal tunnel syndrome, unspecified upper limb: Secondary | ICD-10-CM | POA: Diagnosis not present

## 2018-12-17 DIAGNOSIS — M810 Age-related osteoporosis without current pathological fracture: Secondary | ICD-10-CM | POA: Diagnosis not present

## 2018-12-17 DIAGNOSIS — G894 Chronic pain syndrome: Secondary | ICD-10-CM | POA: Diagnosis not present

## 2018-12-17 DIAGNOSIS — Z720 Tobacco use: Secondary | ICD-10-CM | POA: Diagnosis not present

## 2018-12-17 DIAGNOSIS — G5601 Carpal tunnel syndrome, right upper limb: Secondary | ICD-10-CM | POA: Diagnosis not present

## 2018-12-17 DIAGNOSIS — E049 Nontoxic goiter, unspecified: Secondary | ICD-10-CM | POA: Diagnosis not present

## 2018-12-27 ENCOUNTER — Ambulatory Visit: Payer: Medicare HMO | Admitting: Podiatry

## 2018-12-27 ENCOUNTER — Ambulatory Visit (INDEPENDENT_AMBULATORY_CARE_PROVIDER_SITE_OTHER): Payer: Medicare HMO

## 2018-12-27 ENCOUNTER — Other Ambulatory Visit: Payer: Self-pay

## 2018-12-27 DIAGNOSIS — M79661 Pain in right lower leg: Secondary | ICD-10-CM | POA: Diagnosis not present

## 2018-12-27 DIAGNOSIS — E119 Type 2 diabetes mellitus without complications: Secondary | ICD-10-CM

## 2018-12-27 DIAGNOSIS — M7989 Other specified soft tissue disorders: Secondary | ICD-10-CM | POA: Diagnosis not present

## 2018-12-27 DIAGNOSIS — M25572 Pain in left ankle and joints of left foot: Secondary | ICD-10-CM | POA: Diagnosis not present

## 2018-12-27 DIAGNOSIS — M779 Enthesopathy, unspecified: Secondary | ICD-10-CM | POA: Diagnosis not present

## 2018-12-27 DIAGNOSIS — L89610 Pressure ulcer of right heel, unstageable: Secondary | ICD-10-CM

## 2018-12-27 MED ORDER — DICLOFENAC SODIUM 1 % EX GEL: 2.0000 g | Freq: Two times a day (BID) | CUTANEOUS | 0 refills | Status: DC

## 2018-12-30 ENCOUNTER — Telehealth: Payer: Self-pay | Admitting: *Deleted

## 2018-12-30 DIAGNOSIS — E119 Type 2 diabetes mellitus without complications: Secondary | ICD-10-CM

## 2018-12-30 DIAGNOSIS — M79661 Pain in right lower leg: Secondary | ICD-10-CM

## 2018-12-30 DIAGNOSIS — M7989 Other specified soft tissue disorders: Secondary | ICD-10-CM

## 2018-12-30 NOTE — Telephone Encounter (Signed)
Pt returned call from this morning ?

## 2018-12-30 NOTE — Telephone Encounter (Signed)
-----   Message from Trula Slade, DPM sent at 12/30/2018  7:18 AM EST ----- Can you let her know that I spoke to a radiologist and they have recommended an MRI of the right calf (tif-tib) with and without contrast if she is able to do so to evaluate the soft tissue mass in the calf. Can you please order?   Also, I want to get some blood work. I have put orders in if she can get that done.

## 2018-12-30 NOTE — Telephone Encounter (Signed)
I informed pt of Dr. Leigh Aurora recommendation and orders, to have the labs performed at [facility], [address] and we would have the MRI pre-certed.

## 2018-12-30 NOTE — Progress Notes (Signed)
Subjective: 85 year old female presents the office today for follow-up evaluation of a wound to the posterior aspect the right heel.  She states that the area is doing much better but is still tender.  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  Also she states her left ankles become tender and there is no swelling or redness.  She denies any recent injury to the ankle.  She states that it is tender to touch but not tender to walk.Denies any systemic complaints such as fevers, chills, nausea, vomiting. No acute changes since last appointment, and no other complaints at this time.   Objective: AAO x3, NAD DP/PT pulses palpable bilaterally, CRT less than 3 seconds To the posterior aspect the right heel the wound appears to be healed from observation but it is preulcerative in the area of the blanchable erythematous area and this is worse tenderness.  There is no edema.  There is no fluctuation potation.  There is still tenderness to the right calf as well.  Left ankle there is some minimal erythema to the lateral aspect ankle.  There is no pain with ankle range of motion.  Majority of tenderness on the distal aspect of the fibula.  No pain with calf compression, swelling, warmth, erythema  Assessment: 85 year old female healed right heel wound however preulcerative, right calf pain; left ankle pain  Plan: -All treatment options discussed with the patient including all alternatives, risks, complications.  -X-rays obtained reviewed left ankle there is no evidence of acute fracture or stress fracture that I can see today. -Continue offloading bilateral feet.  New heel pillows dispensed for the left side since he was doing the pain seems to be when putting pressure to the outside aspect as she lays on that side to avoid pressure on the right side. -The right heel wound is  healed continue offloading. -Spoke to the radiologist after reviewing the venous duplex which did reveal a vascular mass. Will order  MRI of the right tib-fib with and without contrast to evaluate the soft tissue mass. -Patient encouraged to call the office with any questions, concerns, change in symptoms.   Trula Slade DPM   Also, will order blood work CBC, CMP, ESR, CRP, Uric Acid given the left ankle pain, redness and with the soft tissue mass.

## 2018-12-30 NOTE — Telephone Encounter (Signed)
Left message on home and mobile phone for pt to call for Dr. Leigh Aurora orders.

## 2018-12-31 ENCOUNTER — Telehealth: Payer: Self-pay | Admitting: *Deleted

## 2018-12-31 NOTE — Telephone Encounter (Signed)
Called and spoke with Tammie West from [facility] and the representative stated that prior authorization needed a review and that I could fax the notes over to [phone] and the case number is [id number] and I faxed the required chart notes. Lattie Haw

## 2019-01-04 ENCOUNTER — Telehealth: Payer: Self-pay | Admitting: *Deleted

## 2019-01-04 NOTE — Telephone Encounter (Signed)
Left message informing pt's dtr, [name] [facility] had been a walk-in lab prior to [place], and may be scheduling to keep pts safely separate and her appt was fine.

## 2019-01-04 NOTE — Telephone Encounter (Signed)
Pt's dtr, [name] states pt was not able to have the blood work drawn yesterday due to a stomach problem, but had to set an appt at [facility] and is scheduled 01/07/2019 11:45am.

## 2019-01-07 DIAGNOSIS — M7989 Other specified soft tissue disorders: Secondary | ICD-10-CM | POA: Diagnosis not present

## 2019-01-07 DIAGNOSIS — M10072 Idiopathic gout, left ankle and foot: Secondary | ICD-10-CM | POA: Diagnosis not present

## 2019-01-07 DIAGNOSIS — M79661 Pain in right lower leg: Secondary | ICD-10-CM | POA: Diagnosis not present

## 2019-01-07 DIAGNOSIS — M778 Other enthesopathies, not elsewhere classified: Secondary | ICD-10-CM | POA: Diagnosis not present

## 2019-01-07 DIAGNOSIS — M779 Enthesopathy, unspecified: Secondary | ICD-10-CM | POA: Diagnosis not present

## 2019-01-08 LAB — COMPLETE METABOLIC PANEL WITH GFR
AG Ratio: 1.2 (calc) (ref 1.0–2.5)
ALT: 10 U/L (ref 6–29)
AST: 12 U/L (ref 10–35)
Albumin: 3.3 g/dL — ABNORMAL LOW (ref 3.6–5.1)
Alkaline phosphatase (APISO): 132 U/L (ref 37–153)
BUN/Creatinine Ratio: 13 (calc) (ref 6–22)
BUN: 22 mg/dL (ref 7–25)
CO2: 25 mmol/L (ref 20–32)
Calcium: 8.7 mg/dL (ref 8.6–10.4)
Chloride: 105 mmol/L (ref 98–110)
Creat: 1.74 mg/dL — ABNORMAL HIGH (ref 0.60–0.88)
GFR, Est African American: 31 mL/min/{1.73_m2} — ABNORMAL LOW (ref >=60)
GFR, Est Non African American: 26 mL/min/{1.73_m2} — ABNORMAL LOW (ref >=60)
Globulin: 2.8 g/dL (calc) (ref 1.9–3.7)
Glucose, Bld: 190 mg/dL — ABNORMAL HIGH (ref 65–139)
Potassium: 4.7 mmol/L (ref 3.5–5.3)
Sodium: 141 mmol/L (ref 135–146)
Total Bilirubin: 0.2 mg/dL (ref 0.2–1.2)
Total Protein: 6.1 g/dL (ref 6.1–8.1)

## 2019-01-08 LAB — CBC WITH DIFFERENTIAL/PLATELET
Absolute Monocytes: 672 cells/uL (ref 200–950)
Basophils Absolute: 84 cells/uL (ref 0–200)
Basophils Relative: 1 %
Eosinophils Absolute: 302 cells/uL (ref 15–500)
Eosinophils Relative: 3.6 %
HCT: 33 % — ABNORMAL LOW (ref 35.0–45.0)
Hemoglobin: 10.5 g/dL — ABNORMAL LOW (ref 11.7–15.5)
Lymphs Abs: 1722 cells/uL (ref 850–3900)
MCH: 29.6 pg (ref 27.0–33.0)
MCHC: 31.8 g/dL — ABNORMAL LOW (ref 32.0–36.0)
MCV: 93 fL (ref 80.0–100.0)
MPV: 9.7 fL (ref 7.5–12.5)
Monocytes Relative: 8 %
Neutro Abs: 5620 cells/uL (ref 1500–7800)
Neutrophils Relative %: 66.9 %
Platelets: 417 10*3/uL — ABNORMAL HIGH (ref 140–400)
RBC: 3.55 10*6/uL — ABNORMAL LOW (ref 3.80–5.10)
RDW: 12.4 % (ref 11.0–15.0)
Total Lymphocyte: 20.5 %
WBC: 8.4 10*3/uL (ref 3.8–10.8)

## 2019-01-08 LAB — C-REACTIVE PROTEIN: CRP: 9.4 mg/L — ABNORMAL HIGH (ref <8.0)

## 2019-01-08 LAB — SEDIMENTATION RATE: Sed Rate: 38 mm/h — ABNORMAL HIGH (ref 0–30)

## 2019-01-08 LAB — URIC ACID: Uric Acid, Serum: 7.2 mg/dL — ABNORMAL HIGH (ref 2.5–7.0)

## 2019-01-10 ENCOUNTER — Other Ambulatory Visit: Payer: Self-pay

## 2019-01-10 ENCOUNTER — Ambulatory Visit: Payer: Medicare HMO | Admitting: Podiatry

## 2019-01-10 DIAGNOSIS — M779 Enthesopathy, unspecified: Secondary | ICD-10-CM | POA: Diagnosis not present

## 2019-01-10 DIAGNOSIS — M7989 Other specified soft tissue disorders: Secondary | ICD-10-CM

## 2019-01-10 DIAGNOSIS — M79661 Pain in right lower leg: Secondary | ICD-10-CM | POA: Diagnosis not present

## 2019-01-10 DIAGNOSIS — M10072 Idiopathic gout, left ankle and foot: Secondary | ICD-10-CM | POA: Diagnosis not present

## 2019-01-10 MED ORDER — METHYLPREDNISOLONE 4 MG PO TBPK: ORAL_TABLET | ORAL | 0 refills | Status: DC

## 2019-01-10 NOTE — Telephone Encounter (Signed)
I was hold for a long time and after several transfers the office was closed. I left a VM to have them call back. I will also try as well tomorrow.

## 2019-01-10 NOTE — Telephone Encounter (Signed)
-----   Message from Sandre Kitty sent at 01/10/2019 10:57 AM EST ----- Regarding: [facility] denied this patient's exam.   Will the provider do a peer to peer, or should the exam be canceled?  A copy of the denial has been scanned.   INS:      [employer] DOS:     02/02/2019 CPT:     82081 MR lower leg  Ref#  [id number]  Appeal information is included in the denial that has been scanned.        Thank you, Sandre Kitty

## 2019-01-12 NOTE — Telephone Encounter (Signed)
I called and it was denied because of no x-ray. Can you order a tib-fib x-ray to be done? Please let her know.   Thanks.

## 2019-01-12 NOTE — Telephone Encounter (Signed)
I informed pt of Dr. Leigh Aurora statement concerning the need for right tib-fib x-rays and for evaluation prior to MRI pre-cert. Pt states understanding and will go Friday 01/14/2019.

## 2019-01-13 NOTE — Progress Notes (Signed)
Good morning! I have been seeing Felita for pain and swelling to both of her legs. When I saw saw her last she did have redness to the ankle and concerned about gout. She also has right calf pain. DVT work up was negative but showed a mass in the right calf. I ordered an MRI but denied because she needs an x-ray. I have ordered that for her and once complete will re-order the MRI.   Due to the swelling in both ankles and pain I did not want to put her on any NSAIDs due to kidney function. She had been on a medrol dose pack previously and she tolerated well. I did order that for her with strict instructions to check blood sugar and be compliant with her medications.   Please let me know if you have any questions  Colvin Caroli [phone]

## 2019-01-13 NOTE — Telephone Encounter (Signed)
I spoke with pt concerning the need for tib-fib x-ray and faxed orders to [facility].

## 2019-01-14 ENCOUNTER — Telehealth: Payer: Self-pay | Admitting: *Deleted

## 2019-01-14 ENCOUNTER — Ambulatory Visit
Admission: RE | Admit: 2019-01-14 | Discharge: 2019-01-14 | Disposition: A | Discharge status: Home / Self Care | Payer: Medicare HMO | Source: Ambulatory Visit | Attending: Podiatry | Admitting: Podiatry

## 2019-01-14 DIAGNOSIS — Z79891 Long term (current) use of opiate analgesic: Secondary | ICD-10-CM | POA: Diagnosis not present

## 2019-01-14 DIAGNOSIS — M549 Dorsalgia, unspecified: Secondary | ICD-10-CM | POA: Diagnosis not present

## 2019-01-14 DIAGNOSIS — E119 Type 2 diabetes mellitus without complications: Secondary | ICD-10-CM

## 2019-01-14 DIAGNOSIS — Z79899 Other long term (current) drug therapy: Secondary | ICD-10-CM | POA: Diagnosis not present

## 2019-01-14 DIAGNOSIS — G5600 Carpal tunnel syndrome, unspecified upper limb: Secondary | ICD-10-CM | POA: Diagnosis not present

## 2019-01-14 DIAGNOSIS — R2241 Localized swelling, mass and lump, right lower limb: Secondary | ICD-10-CM | POA: Diagnosis not present

## 2019-01-14 DIAGNOSIS — M79661 Pain in right lower leg: Secondary | ICD-10-CM

## 2019-01-14 DIAGNOSIS — M19011 Primary osteoarthritis, right shoulder: Secondary | ICD-10-CM | POA: Diagnosis not present

## 2019-01-14 DIAGNOSIS — G894 Chronic pain syndrome: Secondary | ICD-10-CM | POA: Diagnosis not present

## 2019-01-14 DIAGNOSIS — M7989 Other specified soft tissue disorders: Secondary | ICD-10-CM

## 2019-01-14 NOTE — Telephone Encounter (Signed)
She should hold off on NSAIDs given her kidney function and she needs to see her PCP. Also as soon as I get the x-ray results we will re-order the MRI.

## 2019-01-14 NOTE — Telephone Encounter (Signed)
-----   Message from Trula Slade, DPM sent at 01/10/2019  3:31 PM EST ----- Val- can you please let her know that her uric acid level was elevated as well as inflammatory makers. She likely has gout which was causing the redness and swelling. Can you see how she is doing? We can call something in for her but before doing so wanted to see if it is better.

## 2019-01-14 NOTE — Telephone Encounter (Signed)
Left message informing pt of Dr. Leigh Aurora orders of 01/14/2019 11:35am.

## 2019-01-14 NOTE — Telephone Encounter (Signed)
Pt called states area is still swollen, not as red, and is on day 4 of the steroid. I instructed pt to complete the steroid pack and continue the voltaren gel. Pt states she is using the voltaren gel regularly. I told pt I would inform Dr. Jacqualyn Posey, but she would not be able to begin another antiinflammatory until completing the steroids.

## 2019-01-14 NOTE — Telephone Encounter (Signed)
Left message requesting a call to discuss labs.

## 2019-01-19 NOTE — Telephone Encounter (Addendum)
[employer] - Medicare-Lisa states this pt's appeal must be followed up by [employer] for direct appeal [phone]. [employer]-Paul states needs to call Providers Services Line [phone] - Ron transferred to the Provider Line - Nigel Berthold states appeals will need to be faxed to [phone] Attn: Appeals, Call Reference:  [id number]. Faxed orders, latest x-ray report, clinicals and demographics to [facility] Attn:  [place], Reference: [id number] and noted the requested x-rays report was included.

## 2019-01-19 NOTE — Progress Notes (Signed)
Subjective: 85 year old female presents the office today for evaluation of ulcer of the right heel as well as for pain in the right calf.  She states that she still having calf pain.  Venous duplex was negative but did show a mass.  MRI is pending however this was declined by insurance.  I received notification today and I am scheduled to do a peer to peer.  Her last blood sugar was 130.  She said the left ankle is still having quite a bit of pain and rates it 8/10 and still has swelling and redness to the area. Denies any systemic complaints such as fevers, chills, nausea, vomiting. No acute changes since last appointment, and no other complaints at this time.   Objective: AAO x3, NAD DP/PT pulses palpable bilaterally, CRT less than 3 seconds There is total tenderness to palpation of the right calf.  The area in the back of the right heel.  Almost completely healed and is preulcerative but there is no drainage or pus identified.  The left ankle there is still swelling and faint erythema posterior lateral aspect of the ankle. No open lesions or pre-ulcerative lesions.  No pain with calf compression, swelling, warmth, erythema  Assessment: 85 year old female with right calf soft tissue mass/pain; left ankle pain, swelling concern for gout  Plan: -All treatment options discussed with the patient including all alternatives, risks, complications.  -We need to hold off on anti-inflammatories given kidney function.  She is also diabetic.  She has been on Medrol Dosepak before and done well.  I did order this but the strict precautions to check her blood sugar regularly.  Also on schedule a peer to peer for the MRI.  I received notification of denial today. -Patient encouraged to call the office with any questions, concerns, change in symptoms.   Trula Slade DPM  *I called the insurance and was declined because of lack of x-ray.  Right tib-fib x-ray ordered and wants this is done we will order the  MRI again.

## 2019-01-26 DIAGNOSIS — M109 Gout, unspecified: Secondary | ICD-10-CM | POA: Diagnosis not present

## 2019-01-26 DIAGNOSIS — Z79899 Other long term (current) drug therapy: Secondary | ICD-10-CM | POA: Diagnosis not present

## 2019-01-26 DIAGNOSIS — I872 Venous insufficiency (chronic) (peripheral): Secondary | ICD-10-CM | POA: Diagnosis not present

## 2019-01-26 DIAGNOSIS — N184 Chronic kidney disease, stage 4 (severe): Secondary | ICD-10-CM | POA: Diagnosis not present

## 2019-01-26 DIAGNOSIS — R600 Localized edema: Secondary | ICD-10-CM | POA: Diagnosis not present

## 2019-01-31 ENCOUNTER — Other Ambulatory Visit: Payer: Medicare HMO

## 2019-01-31 ENCOUNTER — Telehealth: Payer: Self-pay | Admitting: Podiatry

## 2019-01-31 NOTE — Telephone Encounter (Signed)
Dr. Jacqualyn Posey states the MRI was denied and an Appeal would need to be placed, a new order could not be placed. I called [facility] and informed that Dr. Jacqualyn Posey had performed a Peer to Peer and was told that the MRI was flat out denied and a new MRI could not be ordered an Appeal would have to be started and they were faxing the appeal letter again. I informed Tiara that pt had stated this morning she would self-pay for the MRI if not authorized and not to cancel pt's appt 02/02/2019. Tiara states she will call pt and discuss self-pay cost.

## 2019-01-31 NOTE — Telephone Encounter (Signed)
Sharon 9:23am stated all [employer]vant[employer] members have been transferred to [facility] as of 01/28/2019, and transferred to [place]. [facility] (9:50am) stated pt's information was not in their system and transferred to Provider line. Provider line-Karen F. (10:01am) states for Reference: [id number] PEER to PEER [phone].

## 2019-01-31 NOTE — Telephone Encounter (Signed)
[facility] called stating the patient is scheduled for an appt on Wednesday but they are showing that it was denied. They are curious if we will be doing a peer to peer and if they should go ahead and cancel the patients appt for now.  Please give them a call.

## 2019-02-01 NOTE — Telephone Encounter (Signed)
I called and the case was denied and I was not allowed to do a peer to peer. They said the x-ray findings needed to be submitted through the appeals process and not through a new order. Not sure how to go about doing an appeal but please let me know if you need anything else from me or how to go about doing this. Thanks.

## 2019-02-01 NOTE — Telephone Encounter (Signed)
Second fax clinicals, orders and demographics to [employer] noted as 2nd fax to Appeals, tib/fib x-ay report and documented calls to pt's insurance beginning 12/30/2018 to 02/01/2019. Faxed clinicals, orders and demographics to [place], noted tib/fib x-ray report, documented calls to pt's insurance beginning 12/30/2018 to 02/01/2019, and that denial letter had not been received after Dr. Leigh Aurora call 01/31/2019.

## 2019-02-02 ENCOUNTER — Other Ambulatory Visit: Payer: Self-pay

## 2019-02-02 ENCOUNTER — Ambulatory Visit
Admission: RE | Admit: 2019-02-02 | Discharge: 2019-02-02 | Disposition: A | Discharge status: Home / Self Care | Payer: Self-pay | Source: Ambulatory Visit | Attending: Podiatry | Admitting: Podiatry

## 2019-02-02 DIAGNOSIS — M7989 Other specified soft tissue disorders: Secondary | ICD-10-CM

## 2019-02-02 DIAGNOSIS — D1723 Benign lipomatous neoplasm of skin and subcutaneous tissue of right leg: Secondary | ICD-10-CM | POA: Diagnosis not present

## 2019-02-02 DIAGNOSIS — M79661 Pain in right lower leg: Secondary | ICD-10-CM

## 2019-02-02 DIAGNOSIS — E119 Type 2 diabetes mellitus without complications: Secondary | ICD-10-CM

## 2019-02-02 MED ORDER — GADOBUTROL 1 MMOL/ML IV SOLN
5.0000 mL | Freq: Once | INTRAVENOUS | Status: AC | PRN
  Administered 2019-02-02: 5 mL via INTRAVENOUS

## 2019-02-08 NOTE — Telephone Encounter (Signed)
Fax received 02/04/2019 5:25 pm from [place] of Appeal Case number: [id number] approved from 02/03/2019 through 08/03/2019. Faxed to [place].

## 2019-02-10 ENCOUNTER — Telehealth: Payer: Self-pay | Admitting: *Deleted

## 2019-02-10 DIAGNOSIS — M79661 Pain in right lower leg: Secondary | ICD-10-CM

## 2019-02-10 DIAGNOSIS — M7989 Other specified soft tissue disorders: Secondary | ICD-10-CM

## 2019-02-10 NOTE — Addendum Note (Signed)
Addended by: Harriett Sine D on: 02/10/2019 03:14 PM   Modules accepted: Orders

## 2019-02-10 NOTE — Telephone Encounter (Signed)
I informed pt of Dr. Leigh Aurora review of MRI results and referral to Orthopedics. Pt states understanding and is hesitant to have surgery at this time. I told pt I understood, but she would benefit from discussing with the orthopedic doctor and inform the doctor of her concerns. Pt states agreement. Faxed referral to [employer].

## 2019-02-10 NOTE — Telephone Encounter (Signed)
-----   Message from Trula Slade, DPM sent at 02/08/2019  8:08 AM EST ----- Val- can you put in an ortho consult for possible surgery to remove the lipoma? Thanks.!

## 2019-02-11 DIAGNOSIS — G894 Chronic pain syndrome: Secondary | ICD-10-CM | POA: Diagnosis not present

## 2019-02-11 DIAGNOSIS — M47814 Spondylosis without myelopathy or radiculopathy, thoracic region: Secondary | ICD-10-CM | POA: Diagnosis not present

## 2019-02-11 DIAGNOSIS — M19011 Primary osteoarthritis, right shoulder: Secondary | ICD-10-CM | POA: Diagnosis not present

## 2019-02-11 DIAGNOSIS — G5600 Carpal tunnel syndrome, unspecified upper limb: Secondary | ICD-10-CM | POA: Diagnosis not present

## 2019-02-22 ENCOUNTER — Ambulatory Visit: Payer: Self-pay

## 2019-02-23 ENCOUNTER — Ambulatory Visit: Payer: Medicare HMO | Admitting: Podiatry

## 2019-02-23 ENCOUNTER — Encounter: Payer: Self-pay | Admitting: Podiatry

## 2019-02-23 ENCOUNTER — Other Ambulatory Visit: Payer: Self-pay

## 2019-02-23 DIAGNOSIS — L89522 Pressure ulcer of left ankle, stage 2: Secondary | ICD-10-CM | POA: Diagnosis not present

## 2019-02-23 DIAGNOSIS — L84 Corns and callosities: Secondary | ICD-10-CM

## 2019-02-23 DIAGNOSIS — M79675 Pain in left toe(s): Secondary | ICD-10-CM | POA: Diagnosis not present

## 2019-02-23 DIAGNOSIS — M79674 Pain in right toe(s): Secondary | ICD-10-CM

## 2019-02-23 DIAGNOSIS — E1142 Type 2 diabetes mellitus with diabetic polyneuropathy: Secondary | ICD-10-CM | POA: Diagnosis not present

## 2019-02-23 DIAGNOSIS — B351 Tinea unguium: Secondary | ICD-10-CM

## 2019-02-23 MED ORDER — MUPIROCIN 2 % EX OINT: TOPICAL_OINTMENT | CUTANEOUS | 1 refills | Status: DC

## 2019-02-23 MED ORDER — DOXYCYCLINE HYCLATE 100 MG PO CAPS: 100.0000 mg | ORAL_CAPSULE | Freq: Two times a day (BID) | ORAL | 0 refills | Status: AC

## 2019-02-23 NOTE — Patient Instructions (Addendum)
Wear heel protectors   Pressure Injury  A pressure injury is damage to the skin and underlying tissue that results from pressure being applied to an area of the body. It often affects people who must spend a long time in a bed or chair because of a medical condition. Pressure injuries usually occur:  Over bony parts of the body, such as the tailbone, shoulders, elbows, hips, heels, spine, ankles, and back of the head.  Under medical devices that make contact with the body, such as respiratory equipment, stockings, tubes, and splints. Pressure injuries start as reddened areas on the skin and can lead to pain and an open wound. What are the causes? This condition is caused by frequent or constant pressure to an area of the body. Decreased blood flow to the skin can eventually cause the skin tissue to die and break down, causing a wound. What increases the risk? You are more likely to develop this condition if you:  Are in the hospital or an extended care facility.  Are bedridden or in a wheelchair.  Have an injury or disease that keeps you from: ? Moving normally. ? Feeling pain or pressure.  Have a condition that: ? Makes you sleepy or less alert. ? Causes poor blood flow.  Need to wear a medical device.  Have poor control of your bladder or bowel functions (incontinence).  Have poor nutrition (malnutrition). If you are at risk for pressure injuries, your health care provider may recommend certain types of mattresses, mattress covers, pillows, cushions, or boots to help prevent them. These may include products filled with air, foam, gel, or sand. What are the signs or symptoms? Symptoms of this condition depend on the severity of the injury. Symptoms may include:  Red or dark areas of the skin.  Pain, warmth, or a change of skin texture.  Blisters.  An open wound. How is this diagnosed? This condition is diagnosed with a medical history and physical exam. You may also  have tests, such as:  Blood tests.  Imaging tests.  Blood flow tests. Your pressure injury will be staged based on its severity. Staging is based on:  The depth of the tissue injury, including whether there is exposure of muscle, bone, or tendon.  The cause of the pressure injury. How is this treated? This condition may be treated by:  Relieving or redistributing pressure on your skin. This includes: ? Frequently changing your position. ? Avoiding positions that caused the wound or that can make the wound worse. ? Using specific bed mattresses, chair cushions, or protective boots. ? Moving medical devices from an area of pressure, or placing padding between the skin and the device. ? Using foams, creams, or powders to prevent rubbing (friction) on the skin.  Keeping your skin clean and dry. This may include using a skin cleanser or skin barrier as told by your health care provider.  Cleaning your injury and removing any dead tissue from the wound (debridement).  Placing a bandage (dressing) over your injury.  Using medicines for pain or to prevent or treat infection. Surgery may be needed if other treatments are not working or if your injury is very deep. Follow these instructions at home: Wound care  Follow instructions from your health care provider about how to take care of your wound. Make sure you: ? Wash your hands with soap and water before and after you change your bandage (dressing). If soap and water are not available, use hand sanitizer. ?  Change your dressing as told by your health care provider.  Check your wound every day for signs of infection. Have a caregiver do this for you if you are not able. Check for: ? Redness, swelling, or increased pain. ? More fluid or blood. ? Warmth. ? Pus or a bad smell. Skin care  Keep your skin clean and dry. Gently pat your skin dry.  Do not rub or massage your skin.  You or a caregiver should check your skin every day  for any changes in color or any new blisters or sores (ulcers). Medicines  Take over-the-counter and prescription medicines only as told by your health care provider.  If you were prescribed an antibiotic medicine, take or apply it as told by your health care provider. Do not stop using the antibiotic even if your condition improves. Reducing and redistributing pressure  Do not lie or sit in one position for a long time. Move or change position every 1-2 hours, or as told by your health care provider.  Use pillows or cushions to reduce pressure. Ask your health care provider to recommend cushions or pads for you. General instructions   Eat a healthy diet that includes lots of protein.  Drink enough fluid to keep your urine pale yellow.  Be as active as you can every day. Ask your health care provider to suggest safe exercises or activities.  Do not abuse drugs or alcohol.  Do not use any products that contain nicotine or tobacco, such as cigarettes, e-cigarettes, and chewing tobacco. If you need help quitting, ask your health care provider.  Keep all follow-up visits as told by your health care provider. This is important. Contact a health care provider if:  You have: ? A fever or chills. ? Pain that is not helped by medicine. ? Any changes in skin color. ? New blisters or sores. ? Pus or a bad smell coming from your wound. ? Redness, swelling, or pain around your wound. ? More fluid or blood coming from your wound.  Your wound does not improve after 1-2 weeks of treatment. Summary  A pressure injury is damage to the skin and underlying tissue that results from pressure being applied to an area of the body.  Do not lie or sit in one position for a long time. Your health care provider may advise you to move or change position every 1-2 hours.  Follow instructions from your health care provider about how to take care of your wound.  Keep all follow-up visits as told by your  health care provider. This is important. This information is not intended to replace advice given to you by your health care provider. Make sure you discuss any questions you have with your health care provider. Document Revised: 08/12/2017 Document Reviewed: 08/12/2017 Elsevier Patient Education  [place].   Diabetes Mellitus and [place] care is an important part of your health, especially when you have diabetes. Diabetes may cause you to have problems because of poor blood flow (circulation) to your feet and legs, which can cause your skin to:  Become thinner and drier.  Break more easily.  Heal more slowly.  Peel and crack. You may also have nerve damage (neuropathy) in your legs and feet, causing decreased feeling in them. This means that you may not notice minor injuries to your feet that could lead to more serious problems. Noticing and addressing any potential problems early is the best way to prevent future foot  problems. How to care for your feet Foot hygiene  Wash your feet daily with warm water and mild soap. Do not use hot water. Then, pat your feet and the areas between your toes until they are completely dry. Do not soak your feet as this can dry your skin.  Trim your toenails straight across. Do not dig under them or around the cuticle. File the edges of your nails with an emery board or nail file.  Apply a moisturizing lotion or petroleum jelly to the skin on your feet and to dry, brittle toenails. Use lotion that does not contain alcohol and is unscented. Do not apply lotion between your toes. Shoes and socks  Wear clean socks or stockings every day. Make sure they are not too tight. Do not wear knee-high stockings since they may decrease blood flow to your legs.  Wear shoes that fit properly and have enough cushioning. Always look in your shoes before you put them on to be sure there are no objects inside.  To break in new shoes, wear them for just a  few hours a day. This prevents injuries on your feet. Wounds, scrapes, corns, and calluses  Check your feet daily for blisters, cuts, bruises, sores, and redness. If you cannot see the bottom of your feet, use a mirror or ask someone for help.  Do not cut corns or calluses or try to remove them with medicine.  If you find a minor scrape, cut, or break in the skin on your feet, keep it and the skin around it clean and dry. You may clean these areas with mild soap and water. Do not clean the area with peroxide, alcohol, or iodine.  If you have a wound, scrape, corn, or callus on your foot, look at it several times a day to make sure it is healing and not infected. Check for: ? Redness, swelling, or pain. ? Fluid or blood. ? Warmth. ? Pus or a bad smell. General instructions  Do not cross your legs. This may decrease blood flow to your feet.  Do not use heating pads or hot water bottles on your feet. They may burn your skin. If you have lost feeling in your feet or legs, you may not know this is happening until it is too late.  Protect your feet from hot and cold by wearing shoes, such as at the beach or on hot pavement.  Schedule a complete foot exam at least once a year (annually) or more often if you have foot problems. If you have foot problems, report any cuts, sores, or bruises to your health care provider immediately. Contact a health care provider if:  You have a medical condition that increases your risk of infection and you have any cuts, sores, or bruises on your feet.  You have an injury that is not healing.  You have redness on your legs or feet.  You feel burning or tingling in your legs or feet.  You have pain or cramps in your legs and feet.  Your legs or feet are numb.  Your feet always feel cold.  You have pain around a toenail. Get help right away if:  You have a wound, scrape, corn, or callus on your foot and: ? You have pain, swelling, or redness that gets  worse. ? You have fluid or blood coming from the wound, scrape, corn, or callus. ? Your wound, scrape, corn, or callus feels warm to the touch. ? You have pus  or a bad smell coming from the wound, scrape, corn, or callus. ? You have a fever. ? You have a red line going up your leg. Summary  Check your feet every day for cuts, sores, red spots, swelling, and blisters.  Moisturize feet and legs daily.  Wear shoes that fit properly and have enough cushioning.  If you have foot problems, report any cuts, sores, or bruises to your health care provider immediately.  Schedule a complete foot exam at least once a year (annually) or more often if you have foot problems. This information is not intended to replace advice given to you by your health care provider. Make sure you discuss any questions you have with your health care provider. Document Revised: 10/06/2018 Document Reviewed: 02/15/2016 Elsevier Patient Education  [place].

## 2019-02-23 NOTE — Progress Notes (Signed)
Subjective: Patient presents today for preventative diabetic foot care. She has been seeing Dr. Jacqualyn Posey soft tissue mass/pain of right calf as well as suspected gout.  She did follow up with Raliegh Ip for right lower extremity soft tissue mass and was advised she did not need surgical intervention at this time.  She has not been wearing her open-toed compression hose as they cause pain to her right 5th digit.   Today, she relates pain to outside of left ankle. She denies any episodes of trauma to extremity. She does favor sleeping on her left side. She denies any drainage or odor from area.        She is also seen for painful, discolored, thick toenails which interfere with daily activities. Pain is aggravated when wearing enclosed shoe gear. Pain is getting progressively worse and relieved with periodic professional debridement.  [name], MD is her PCP.   Current Outpatient Medications on File Prior to Visit  Medication Sig Dispense Refill  . Accu-Chek FastClix Lancets MISC USE 1 LANCET TO CHECK BLOOD SUGAR 3 TIMES DAILY FOR TYPE 2 DIABETES MELLITUS ON INSULIN    . ACCU-CHEK SMARTVIEW test strip USE TO CHECK BLOOD SUGAR THREE TIMES DAILY    . allopurinol (ZYLOPRIM) 100 MG tablet Take 100 mg by mouth daily.    Marland Kitchen amitriptyline (ELAVIL) 25 MG tablet Take 25 mg by mouth at bedtime.    Marland Kitchen amLODipine (NORVASC) 5 MG tablet Take 5 mg by mouth daily.    Marland Kitchen aspirin EC 81 MG tablet Take 1 tablet (81 mg total) by mouth daily.    . BD PEN NEEDLE NANO U/F 32G X 4 MM MISC     . clidinium-chlordiazePOXIDE (LIBRAX) 5-2.5 MG capsule Take 1 capsule by mouth 2 (two) times daily.    . colchicine 0.6 MG tablet Take 0.6 mg by mouth 2 (two) times daily.    . diclofenac Sodium (VOLTAREN) 1 % GEL diclofenac 1 % topical gel    . diclofenac Sodium (VOLTAREN) 1 % GEL Apply 2 g topically 2 (two) times daily. Rub into affected area of foot 2 times daily 100 g 0  . diphenhydrAMINE (SOMINEX) 25 MG  tablet Take by mouth.    . EMBEDA 20-0.8 MG CPCR     . escitalopram (LEXAPRO) 10 MG tablet Take 10 mg by mouth daily.    . EUTHYROX 75 MCG tablet TAKE ONE TABLET BY MOUTH 6 DAYS A WEEK BUT ONLY ONE HALF A TABLET ON SUNDAYS    . famotidine (PEPCID) 20 MG tablet TAKE 1 TABLET BY MOUTH ONCE DAILY AT BEDTIME AS NEEDED FOR 90 DAYS    . furosemide (LASIX) 40 MG tablet Take 1 tablet (40 mg total) by mouth every other day AND 0.5 tablets (20 mg total) every other day. 45 tablet 3  . hyoscyamine (LEVBID) 0.375 MG 12 hr tablet Take by mouth.    . insulin glargine (LANTUS) 100 unit/mL SOPN Inject 3 Units into the skin daily after breakfast.     . levothyroxine (SYNTHROID) 88 MCG tablet TAKE 1 TABLET BY MOUTH ONCE DAILY ON AN EMPTY STOMACH IN THE MORNING    . lidocaine (XYLOCAINE) 5 % ointment Apply QID to affected area as needed for pain.    Marland Kitchen lisinopril (ZESTRIL) 5 MG tablet     . mesalamine (LIALDA) 1.2 G EC tablet Take 2.4 g by mouth daily after breakfast.    . methylPREDNISolone (MEDROL DOSEPAK) 4 MG TBPK tablet Take as directed 21 tablet 0  .  MORPHABOND ER 15 MG T12A     . nitroGLYCERIN (NITROSTAT) 0.4 MG SL tablet Place 1 tablet (0.4 mg total) under the tongue every 5 (five) minutes as needed for chest pain. 25 tablet 3  . Oxycodone HCl 10 MG TABS Take 10 mg by mouth 2 (two) times daily.     . polyethylene glycol (MIRALAX / GLYCOLAX) packet Take 17 g by mouth daily as needed for mild constipation.    . Probiotic Product (ALIGN PO) Take 1 capsule by mouth daily after breakfast.    . ranitidine (ZANTAC) 150 MG capsule Take 1 capsule by mouth daily.    . simvastatin (ZOCOR) 10 MG tablet Take 10 mg by mouth at bedtime.      No current facility-administered medications on file prior to visit.     Allergies  Allergen Reactions  . Atorvastatin Nausea And Vomiting  . Byetta 10 Mcg Pen [Exenatide] Nausea And Vomiting  . Cefaclor Other (See Comments)    Pt does not remember the reaction  . Codeine  Nausea And Vomiting       . Crestor [Rosuvastatin] Nausea And Vomiting  . Dexilant [Dexlansoprazole] Diarrhea  . Erythromycin Hives and Swelling  . Fosamax [Alendronate Sodium] Other (See Comments)    GI intolerance  . Macrobid [employer] Macro] Other (See Comments)    Pt does not remember the reaction  . Metformin And Related Nausea And Vomiting  . Nsaids     Elevated creatinine   . Penicillins Hives and Swelling    Has patient had a PCN reaction causing immediate rash, facial/tongue/throat swelling, SOB or lightheadedness with hypotension: Yes Has patient had a PCN reaction causing severe rash involving mucus membranes or skin necrosis: No Has patient had a PCN reaction that required hospitalization No Has patient had a PCN reaction occurring within the last 10 years: No If all of the above answers are "NO", then may proceed with Cephalosporin use.  . Tolmetin Other (See Comments)    Elevated creatinine   . Shellfish Allergy Nausea And Vomiting, Other (See Comments) and Rash    Felt spaced out Felt spaced out Felt spaced out   Objective: There were no vitals filed for this visit.  Vascular Examination: Capillary refill time to digits <3 seconds.  Dorsalis pedis pulses palpable b/l.  Posterior tibial pulses palpable b/l.  Digital hair absent x 10 digits.  Skin temperature gradient WNL b/l.  +1 edema b/l LE.  Dermatological Examination: Skin thin, shiny and atrophic b/l  Toenails 1-5 b/l discolored, thick, dystrophic with subungual debris and pain with palpation to nailbeds due to thickness of nails.  Hyperkeratotic lesion lateral aspect right 5th digit. No erythema, no edema, no drainage, no flocculence noted.  Ulceration located left lateral malleolus: Measurements carried out today of 1.0 x 1.0 cm with periulcerative blanchable erythema. There is serous drainage from area. No lymphangitis. No purulence expressed. No flocculence or  malodor.  Musculoskeletal: Muscle strength testing deferred today.  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  Neurological: Sensation diminished with 10 gram monofilament.  Assessment: 1. Painful onychomycosis toenails 1-5 b/l 2. Corn right 5th digit 3. Stage 2 pressure ulcer left lateral malleolus, noninfected 4. NIDDM with neuropathy  Plan: 1. Toenails 1-5 b/l were debrided in length and girth without iatrogenic bleeding. 2. Ulcer left ankle was cleansed with wound cleanser. Triple antibiotic ointment was applied to base of wound with light dressing. 3. She is to cleanse wound with wound cleanser, dab dry and apply a light  amount of Murpirocin Ointment to area and cover with light dressing. She is to wear heel protectors for both lower extremities when in bed. 4. Rx for doxycycline 100 mg po bid x 10 days.  5. Patient instructed to report to emergency department with worsening appearance of ulcer/toe/foot, increased pain, foul odor, increased redness, swelling, drainage, fever, chills, nightsweats, nausea, vomiting, increased blood sugar.  6. Patient/POA related understanding. 7. Follow up 1 week with Dr. Jacqualyn Posey for ulceration left lateral malleolus. 8. Patient/POA to call should there be a concern in the interim.

## 2019-02-25 ENCOUNTER — Telehealth: Payer: Self-pay | Admitting: Podiatry

## 2019-02-25 NOTE — Telephone Encounter (Signed)
Left message informing pt's dtr, [name] that the irritated area appeared to be due to pressure and that she should rest with the heels off the bed, sofa, etc, and not cross her legs since the area is on a bony prominence.

## 2019-02-25 NOTE — Telephone Encounter (Signed)
Pt was seen in office earlier this week and has a pressure sore/ulcer on her foot and is scheduled for a 1w follow up appt with Dr. Jacqualyn Posey on 03/08/2019. Pts daughter would like a call from the nurse to discuss her mothers condition in more detail.   Please give patients daughter a call.

## 2019-03-02 ENCOUNTER — Other Ambulatory Visit: Payer: Self-pay

## 2019-03-02 ENCOUNTER — Ambulatory Visit: Payer: Medicare HMO | Admitting: Podiatry

## 2019-03-02 DIAGNOSIS — E1351 Other specified diabetes mellitus with diabetic peripheral angiopathy without gangrene: Secondary | ICD-10-CM | POA: Diagnosis not present

## 2019-03-02 DIAGNOSIS — E1151 Type 2 diabetes mellitus with diabetic peripheral angiopathy without gangrene: Secondary | ICD-10-CM | POA: Diagnosis not present

## 2019-03-02 DIAGNOSIS — L97329 Non-pressure chronic ulcer of left ankle with unspecified severity: Secondary | ICD-10-CM

## 2019-03-02 MED ORDER — GABAPENTIN 100 MG PO CAPS: 100.0000 mg | ORAL_CAPSULE | Freq: Three times a day (TID) | ORAL | 3 refills | Status: DC

## 2019-03-03 ENCOUNTER — Encounter: Payer: Self-pay | Admitting: Podiatry

## 2019-03-03 ENCOUNTER — Ambulatory Visit: Payer: Self-pay

## 2019-03-03 NOTE — Progress Notes (Signed)
Subjective:  Patient ID: Tammie West, female    DOB: 26-May-1932,  MRN: [id number]  Chief Complaint  Patient presents with  . Foot Ulcer    pt is here for a small lesion of the left foot lateral side. pt states that the pain has been going on for about 2-3 weeks, pt states that it is painful to the touch, and states that it could be due to gout.    86 y.o. female presents for wound care.  Patient is here with small punctate ulceration on the left lateral malleolus.  Patient states she noticed it about 2 to 3 weeks ago has progressively gotten worse and is very painful in nature.  She states is painful when applying pressure.  She states that it could possibly have to do with gout however she is on gout medication allopurinol and is under control.  Patient has tried various ointment and Band-Aid but is still in continuous pain.  She is a patient of Dr. Elisha Ponder.  Patient is also diabetic per her medical history with last A1c that is unknown.  She describes the pain as burning/shooting sensation right around the lateral malleolus.  She denies any other acute complaints.   Review of Systems: Negative except as noted in the HPI. Denies N/V/F/Ch.  Past Medical History:  Diagnosis Date  . Bilateral carpal tunnel syndrome 02/17/2017  . Carpal tunnel syndrome   . CKD (chronic kidney disease) stage 4, GFR 15-29 ml/min (HCC)   . Coronary artery disease 11/2014    cath with Prox RCA to Mid RCA lesion, 45% stenosed and Ost LAD to Prox LAD lesion, 30% stenosed byt recent cath and high calcium score by coronary CTA.  . Crohn's disease ([place])   . Depression   . Diabetes mellitus without complication ([place])   . GERD (gastroesophageal reflux disease)   . Goiter   . Hyperlipidemia   . Hypertension   . Hypothyroidism   . Insomnia   . Migraine   . Mitral regurgitation    mild to moderate by echo 04/2017  . Osteoarthritis   . Osteoporosis   . Trochanteric bursitis     Current Outpatient  Medications:  .  Accu-Chek FastClix Lancets MISC, USE 1 LANCET TO CHECK BLOOD SUGAR 3 TIMES DAILY FOR TYPE 2 DIABETES MELLITUS ON INSULIN, Disp: , Rfl:  .  ACCU-CHEK SMARTVIEW test strip, USE TO CHECK BLOOD SUGAR THREE TIMES DAILY, Disp: , Rfl:  .  allopurinol (ZYLOPRIM) 100 MG tablet, Take 100 mg by mouth daily., Disp: , Rfl:  .  amitriptyline (ELAVIL) 25 MG tablet, Take 25 mg by mouth at bedtime., Disp: , Rfl:  .  amLODipine (NORVASC) 5 MG tablet, Take 5 mg by mouth daily., Disp: , Rfl:  .  aspirin EC 81 MG tablet, Take 1 tablet (81 mg total) by mouth daily., Disp: , Rfl:  .  BD PEN NEEDLE NANO U/F 32G X 4 MM MISC, , Disp: , Rfl:  .  clidinium-chlordiazePOXIDE (LIBRAX) 5-2.5 MG capsule, Take 1 capsule by mouth 2 (two) times daily., Disp: , Rfl:  .  colchicine 0.6 MG tablet, Take 0.6 mg by mouth 2 (two) times daily., Disp: , Rfl:  .  diclofenac Sodium (VOLTAREN) 1 % GEL, diclofenac 1 % topical gel, Disp: , Rfl:  .  diclofenac Sodium (VOLTAREN) 1 % GEL, Apply 2 g topically 2 (two) times daily. Rub into affected area of foot 2 times daily, Disp: 100 g, Rfl: 0 .  diphenhydrAMINE (SOMINEX) 25  MG tablet, Take by mouth., Disp: , Rfl:  .  doxycycline (VIBRAMYCIN) 100 MG capsule, Take 1 capsule (100 mg total) by mouth 2 (two) times daily for 10 days., Disp: 20 capsule, Rfl: 0 .  EMBEDA 20-0.8 MG CPCR, , Disp: , Rfl:  .  escitalopram (LEXAPRO) 10 MG tablet, Take 10 mg by mouth daily., Disp: , Rfl:  .  EUTHYROX 75 MCG tablet, TAKE ONE TABLET BY MOUTH 6 DAYS A WEEK BUT ONLY ONE HALF A TABLET ON SUNDAYS, Disp: , Rfl:  .  famotidine (PEPCID) 20 MG tablet, TAKE 1 TABLET BY MOUTH ONCE DAILY AT BEDTIME AS NEEDED FOR 90 DAYS, Disp: , Rfl:  .  insulin glargine (LANTUS) 100 unit/mL SOPN, Inject 3 Units into the skin daily after breakfast. , Disp: , Rfl:  .  levothyroxine (SYNTHROID) 88 MCG tablet, TAKE 1 TABLET BY MOUTH ONCE DAILY ON AN EMPTY STOMACH IN THE MORNING, Disp: , Rfl:  .  lidocaine (XYLOCAINE) 5 %  ointment, Apply QID to affected area as needed for pain., Disp: , Rfl:  .  lisinopril (ZESTRIL) 5 MG tablet, , Disp: , Rfl:  .  mesalamine (LIALDA) 1.2 G EC tablet, Take 2.4 g by mouth daily after breakfast., Disp: , Rfl:  .  methylPREDNISolone (MEDROL DOSEPAK) 4 MG TBPK tablet, Take as directed, Disp: 21 tablet, Rfl: 0 .  MORPHABOND ER 15 MG T12A, , Disp: , Rfl:  .  mupirocin ointment (BACTROBAN) 2 %, Apply to left ankle once daily and cover with dressing., Disp: 22 g, Rfl: 1 .  nitroGLYCERIN (NITROSTAT) 0.4 MG SL tablet, Place 1 tablet (0.4 mg total) under the tongue every 5 (five) minutes as needed for chest pain., Disp: 25 tablet, Rfl: 3 .  Oxycodone HCl 10 MG TABS, Take 10 mg by mouth 2 (two) times daily. , Disp: , Rfl:  .  polyethylene glycol (MIRALAX / GLYCOLAX) packet, Take 17 g by mouth daily as needed for mild constipation., Disp: , Rfl:  .  Probiotic Product (ALIGN PO), Take 1 capsule by mouth daily after breakfast., Disp: , Rfl:  .  ranitidine (ZANTAC) 150 MG capsule, Take 1 capsule by mouth daily., Disp: , Rfl:  .  simvastatin (ZOCOR) 10 MG tablet, Take 10 mg by mouth at bedtime. , Disp: , Rfl:  .  furosemide (LASIX) 40 MG tablet, Take 1 tablet (40 mg total) by mouth every other day AND 0.5 tablets (20 mg total) every other day., Disp: 45 tablet, Rfl: 3 .  gabapentin (NEURONTIN) 100 MG capsule, Take 1 capsule (100 mg total) by mouth 3 (three) times daily., Disp: 90 capsule, Rfl: 3 .  hyoscyamine (LEVBID) 0.375 MG 12 hr tablet, Take by mouth., Disp: , Rfl:   Social History   Tobacco Use  Smoking Status Current Some Day Smoker  . Packs/day: 0.25  . Types: Cigarettes  Smokeless Tobacco Never Used    Allergies  Allergen Reactions  . Atorvastatin Nausea And Vomiting  . Byetta 10 Mcg Pen [Exenatide] Nausea And Vomiting  . Cefaclor Other (See Comments)    Pt does not remember the reaction  . Codeine Nausea And Vomiting       . Crestor [Rosuvastatin] Nausea And Vomiting  .  Dexilant [Dexlansoprazole] Diarrhea  . Erythromycin Hives and Swelling  . Fosamax [Alendronate Sodium] Other (See Comments)    GI intolerance  . Macrobid [employer] Macro] Other (See Comments)    Pt does not remember the reaction  . Metformin And Related Nausea  And Vomiting  . Nsaids     Elevated creatinine   . Penicillins Hives and Swelling    Has patient had a PCN reaction causing immediate rash, facial/tongue/throat swelling, SOB or lightheadedness with hypotension: Yes Has patient had a PCN reaction causing severe rash involving mucus membranes or skin necrosis: No Has patient had a PCN reaction that required hospitalization No Has patient had a PCN reaction occurring within the last 10 years: No If all of the above answers are "NO", then may proceed with Cephalosporin use.  . Tolmetin Other (See Comments)    Elevated creatinine   . Shellfish Allergy Nausea And Vomiting, Other (See Comments) and Rash    Felt spaced out Felt spaced out Felt spaced out   Objective:  There were no vitals filed for this visit. There is no height or weight on file to calculate BMI. Constitutional Well developed. Well nourished.  Vascular Dorsalis pedis pulses palpable bilaterally. Posterior tibial pulses palpable bilaterally. Capillary refill normal to all digits.  No cyanosis or clubbing noted. Pedal hair growth normal.  Neurologic Normal speech. Oriented to person, place, and time. Protective sensation absent  Dermatologic Wound Location: L lower leg lateral at the level of the lateral malleolus Wound Base: Mixed Granular/Fibrotic Peri-wound: Reddened Exudate: None: wound tissue dry Wound Measurements: -See below  Orthopedic:  Significant pain on palpation to the left lateral ulcer site.  No clinical signs of infection noted.  No malodor no drainage no erythema extending greater than 2 cm noticed.   Radiographs: None Assessment:   1. Diabetes mellitus type 2 with peripheral  artery disease ([place])   2. Ischemic ulcer of left ankle, unspecified ulcer stage ([place])   3. Peripheral vascular disease due to secondary diabetes [facility])    Plan:  Patient was evaluated and treated and all questions answered.  Arterial ulcer left lateral malleolus secondary to possible peripheral vascular disease -I explained to the patient the etiology of arterial ulcers versus venous stasis ulcer.  Given that patient also has a history of edema to the lower extremity this ulcer could likely be due to venous stasis however venous stasis ulcers do not generally form on prominent site of foot and ankle and they are generally not very painful.  However I believe given the painful nature of this ulcer as well as the location of the ulcer this might be due to arterial nature leading to ulceration.  I believe that patient will benefit from vascular work-up with ABIs PVRs and possible vascular intervention. -For now patient will benefit from gabapentin for pain control.  Patient is already on chronic pain medication for her back pain by her primary care physician.  However I do not believe that pain medication will help completely alleviate her pain secondary to the nature of the etiology. -Patient will apply Neosporin and a Band-Aid to keep the wound covered.  Patient is also encouraged to finish up her antibiotics that was prescribed by Dr. Adah Perl. -Either I will see the patient back or Dr. Jacqualyn Posey will given that patient is known to Dr. Earleen Newport for the other lower extremity. -Mechanical debridement was performed as described below. -I believe patient will also benefit from ankle views during next visit to evaluate the depth of the wound.  However I do not believe the wound is deep enough to cause osteomyelitic changes to the bone. -ABIs PVRs were ordered.  Procedure: Excisional Debridement of Wound Rationale: Removal of non-viable soft tissue from the wound to promote healing.  Anesthesia:  none Pre-Debridement Wound Measurements: 0.2 cm x 0.1 cm x 0.2 cm  Post-Debridement Wound Measurements: Same Type of Debridement: Sharp Excisional Tissue Removed: Non-viable soft tissue Depth of Debridement: subcutaneous tissue. Technique: Sharp excisional debridement to bleeding, viable wound base.  Dressing: Dry, sterile, compression dressing. Disposition: Patient tolerated procedure well. Patient to return in 1 week for follow-up.  No follow-ups on file.

## 2019-03-08 ENCOUNTER — Ambulatory Visit: Payer: Medicare HMO | Admitting: Podiatry

## 2019-03-08 ENCOUNTER — Other Ambulatory Visit: Payer: Self-pay

## 2019-03-08 ENCOUNTER — Ambulatory Visit (INDEPENDENT_AMBULATORY_CARE_PROVIDER_SITE_OTHER): Payer: Medicare HMO

## 2019-03-08 DIAGNOSIS — L97329 Non-pressure chronic ulcer of left ankle with unspecified severity: Secondary | ICD-10-CM

## 2019-03-08 DIAGNOSIS — E1151 Type 2 diabetes mellitus with diabetic peripheral angiopathy without gangrene: Secondary | ICD-10-CM | POA: Diagnosis not present

## 2019-03-08 MED ORDER — DOXYCYCLINE HYCLATE 100 MG PO TABS: 100.0000 mg | ORAL_TABLET | Freq: Two times a day (BID) | ORAL | 0 refills | Status: DC

## 2019-03-14 ENCOUNTER — Telehealth: Payer: Self-pay | Admitting: *Deleted

## 2019-03-14 NOTE — Telephone Encounter (Signed)
-----   Message from Trula Slade, DPM sent at 03/14/2019 12:38 PM EST ----- Can you please order arterial duplex? Thanks.

## 2019-03-14 NOTE — Telephone Encounter (Signed)
Ordered the arterial studies today per Dr Jacqualyn Posey. Tammie West

## 2019-03-14 NOTE — Progress Notes (Signed)
Subjective: 86 year old female presents the office today for follow-up evaluation of a wound to the left ankle on the lateral malleolus.  She has been keeping small amount of antibiotic ointment on the wound daily followed by a bandage.  She states that the wound is not healing.  Denies any drainage or pus or any swelling or redness that she recalls and denies any red streaks.  Area is tender with pressure. Denies any systemic complaints such as fevers, chills, nausea, vomiting. No acute changes since last appointment, and no other complaints at this time.   Objective: AAO x3, NAD DP, PT pulses palpable but decreased bilaterally.  CRT less than 3 seconds Ulceration of the lateral aspect of the ankle and the lateral malleolus distally with a granular base.  Measures 0.4 x 0.4 x 0.1 cm.  There is no probing to bone, undermining or tunneling.  Mild faint surrounding erythema but there is no ascending cellulitis.  There is no fluctuation or crepitation.  There is no malodor.  The erythema I think is more coming from inflammation as opposed to infection. No open lesions or pre-ulcerative lesions.  No pain with calf compression, swelling, warmth, erythema  Assessment: Ulceration left lateral malleolus  Plan: -All treatment options discussed with the patient including all alternatives, risks, complications.  -X-rays obtained reviewed.  Is no evidence of acute fracture, osteomyelitis there is no soft tissue edema. -Arterial studies ordered. -Doxycycline -Iodosorb daily. -Offloading -Monitor for any clinical signs or symptoms of infection and directed to call the office immediately should any occur or go to the ER. -Patient encouraged to call the office with any questions, concerns, change in symptoms.   Return in about 1 week (around 03/15/2019).  Trula Slade DPM

## 2019-03-14 NOTE — Addendum Note (Signed)
Addended by: Cranford Mon R on: 03/14/2019 01:40 PM   Modules accepted: Orders

## 2019-03-15 ENCOUNTER — Ambulatory Visit: Payer: Medicare HMO | Admitting: Podiatry

## 2019-03-15 ENCOUNTER — Encounter: Payer: Self-pay | Admitting: Podiatry

## 2019-03-15 ENCOUNTER — Other Ambulatory Visit: Payer: Self-pay

## 2019-03-15 ENCOUNTER — Other Ambulatory Visit: Payer: Self-pay | Admitting: Podiatry

## 2019-03-15 ENCOUNTER — Ambulatory Visit: Payer: Self-pay

## 2019-03-15 DIAGNOSIS — E1151 Type 2 diabetes mellitus with diabetic peripheral angiopathy without gangrene: Secondary | ICD-10-CM

## 2019-03-15 DIAGNOSIS — L97329 Non-pressure chronic ulcer of left ankle with unspecified severity: Secondary | ICD-10-CM

## 2019-03-17 ENCOUNTER — Ambulatory Visit (HOSPITAL_COMMUNITY): Payer: Medicare HMO

## 2019-03-21 NOTE — Progress Notes (Signed)
Subjective: 86 year old female presents the office today for follow-up evaluation of a wound to the left ankle on the lateral malleolus.  She feels that the wound is doing about the same.  She denies any redness or drainage or any swelling at this time.  She gets discomfort with pressure to the area.  No red streaking.  She denies any fevers, chills, nausea, vomiting.  No calf pain, chest pain, shortness of breath.    Objective: AAO x3, NAD DP, PT pulses palpable but decreased bilaterally.  CRT less than 3 seconds Ulceration of the lateral aspect of the ankle and the lateral malleolus distally with a granular base.  Measures 0.5 x 0.4 x 0.1 cm.  It appears to be scabbing over.  The mild erythema that she had along the wound previously is almost resolved.  There is no fluctuation crepitation.  There is no malodor.  There is no probing to bone, undermining or tunneling.  No open lesions or pre-ulcerative lesions.  No pain with calf compression, swelling, warmth, erythema  Assessment: Ulceration left lateral malleolus  Plan: -All treatment options discussed with the patient including all alternatives, risks, complications.  -Arterial studies are still pending.  We will follow up on this. -Finish course of antibiotics -Daily dressing changes with Iodosorb. -Offloading -Monitor for any clinical signs or symptoms of infection and directed to call the office immediately should any occur or go to the ER. -Patient encouraged to call the office with any questions, concerns, change in symptoms.   Return in about 10 days (around 03/25/2019).  Trula Slade DPM

## 2019-03-28 ENCOUNTER — Other Ambulatory Visit: Payer: Self-pay

## 2019-03-28 ENCOUNTER — Encounter: Payer: Self-pay | Admitting: Podiatry

## 2019-03-28 ENCOUNTER — Ambulatory Visit: Payer: Medicare HMO | Admitting: Podiatry

## 2019-03-28 DIAGNOSIS — E1351 Other specified diabetes mellitus with diabetic peripheral angiopathy without gangrene: Secondary | ICD-10-CM | POA: Diagnosis not present

## 2019-03-28 DIAGNOSIS — E1151 Type 2 diabetes mellitus with diabetic peripheral angiopathy without gangrene: Secondary | ICD-10-CM

## 2019-03-28 DIAGNOSIS — L97329 Non-pressure chronic ulcer of left ankle with unspecified severity: Secondary | ICD-10-CM | POA: Diagnosis not present

## 2019-03-28 MED ORDER — DOXYCYCLINE HYCLATE 100 MG PO TABS: 100.0000 mg | ORAL_TABLET | Freq: Two times a day (BID) | ORAL | 0 refills | Status: DC

## 2019-03-30 ENCOUNTER — Other Ambulatory Visit: Payer: Self-pay

## 2019-03-30 ENCOUNTER — Ambulatory Visit (HOSPITAL_COMMUNITY)
Admission: RE | Admit: 2019-03-30 | Discharge: 2019-03-30 | Disposition: A | Discharge status: Home / Self Care | Payer: Medicare HMO | Source: Ambulatory Visit | Attending: Internal Medicine | Admitting: Internal Medicine

## 2019-03-30 DIAGNOSIS — E1151 Type 2 diabetes mellitus with diabetic peripheral angiopathy without gangrene: Secondary | ICD-10-CM | POA: Diagnosis not present

## 2019-03-30 DIAGNOSIS — L97329 Non-pressure chronic ulcer of left ankle with unspecified severity: Secondary | ICD-10-CM | POA: Diagnosis not present

## 2019-03-31 ENCOUNTER — Encounter: Payer: Self-pay | Admitting: Cardiovascular Disease

## 2019-03-31 ENCOUNTER — Other Ambulatory Visit (HOSPITAL_COMMUNITY)
Admission: RE | Admit: 2019-03-31 | Discharge: 2019-03-31 | Disposition: A | Discharge status: Home / Self Care | Payer: Medicare HMO | Source: Ambulatory Visit | Attending: Cardiovascular Disease | Admitting: Cardiovascular Disease

## 2019-03-31 ENCOUNTER — Ambulatory Visit: Payer: Medicare HMO | Admitting: Cardiovascular Disease

## 2019-03-31 VITALS — BP 140/60 | HR 77 | Ht 64.0 in | Wt 108.0 lb

## 2019-03-31 DIAGNOSIS — Z20822 Contact with and (suspected) exposure to covid-19: Secondary | ICD-10-CM | POA: Diagnosis not present

## 2019-03-31 DIAGNOSIS — Z01812 Encounter for preprocedural laboratory examination: Secondary | ICD-10-CM | POA: Diagnosis present

## 2019-03-31 DIAGNOSIS — Z9889 Other specified postprocedural states: Secondary | ICD-10-CM | POA: Insufficient documentation

## 2019-03-31 DIAGNOSIS — Z959 Presence of cardiac and vascular implant and graft, unspecified: Secondary | ICD-10-CM

## 2019-03-31 DIAGNOSIS — I998 Other disorder of circulatory system: Secondary | ICD-10-CM | POA: Diagnosis not present

## 2019-03-31 DIAGNOSIS — Z79899 Other long term (current) drug therapy: Secondary | ICD-10-CM

## 2019-03-31 DIAGNOSIS — L97329 Non-pressure chronic ulcer of left ankle with unspecified severity: Secondary | ICD-10-CM | POA: Diagnosis not present

## 2019-03-31 LAB — WOUND CULTURE
MICRO NUMBER:: 10199666
RESULT:: NO GROWTH
SPECIMEN QUALITY:: ADEQUATE

## 2019-03-31 NOTE — H&P (View-Only) (Signed)
03/31/2019 Tammie West   1932/05/12  [id number]  Primary Physician [name], MD Primary Cardiologist: Lorretta Harp MD [name], [place]  HPI:  Tammie West is a 86 y.o. thin appearing married Caucasian female mother of 80 children, grandmother of 3 grandchildren who is accompanied by one of her daughters [name].  She was referred to me by Dr. Jacqualyn Posey, her podiatrist for nonhealing ulcer on her left lateral malleolus.  She has a history of treated hypertension, diabetes and hyperlipidemia.  She does continue to smoke.  She has noncritical CAD by cath.  She has diastolic dysfunction as well.  She has complained of left calf claudication.  She developed a wound on her left lateral malleolus which has been getting worse despite aggressive local wound care with recent Dopplers that showed a high-frequency signal in her mid left SFA.   Current Meds  Medication Sig  . Accu-Chek FastClix Lancets MISC USE 1 LANCET TO CHECK BLOOD SUGAR 3 TIMES DAILY FOR TYPE 2 DIABETES MELLITUS ON INSULIN  . ACCU-CHEK SMARTVIEW test strip USE TO CHECK BLOOD SUGAR THREE TIMES DAILY  . allopurinol (ZYLOPRIM) 100 MG tablet Take 100 mg by mouth daily.  Marland Kitchen amitriptyline (ELAVIL) 25 MG tablet Take 25 mg by mouth at bedtime.  Marland Kitchen amLODipine (NORVASC) 5 MG tablet Take 5 mg by mouth daily.  Marland Kitchen aspirin EC 81 MG tablet Take 1 tablet (81 mg total) by mouth daily.  . BD PEN NEEDLE NANO U/F 32G X 4 MM MISC   . clidinium-chlordiazePOXIDE (LIBRAX) 5-2.5 MG capsule Take 1 capsule by mouth 2 (two) times daily.  . colchicine 0.6 MG tablet Take 0.6 mg by mouth 2 (two) times daily.  . diclofenac Sodium (VOLTAREN) 1 % GEL diclofenac 1 % topical gel  . diclofenac Sodium (VOLTAREN) 1 % GEL Apply 2 g topically 2 (two) times daily. Rub into affected area of foot 2 times daily  . diphenhydrAMINE (SOMINEX) 25 MG tablet Take by mouth.  . doxycycline (VIBRA-TABS) 100 MG tablet Take 1 tablet (100 mg total)  by mouth 2 (two) times daily.  Marland Kitchen doxycycline (VIBRA-TABS) 100 MG tablet Take 1 tablet (100 mg total) by mouth 2 (two) times daily.  . EMBEDA 20-0.8 MG CPCR   . escitalopram (LEXAPRO) 10 MG tablet Take 10 mg by mouth daily.  . EUTHYROX 75 MCG tablet TAKE ONE TABLET BY MOUTH 6 DAYS A WEEK BUT ONLY ONE HALF A TABLET ON SUNDAYS  . famotidine (PEPCID) 20 MG tablet TAKE 1 TABLET BY MOUTH ONCE DAILY AT BEDTIME AS NEEDED FOR 90 DAYS  . gabapentin (NEURONTIN) 100 MG capsule Take 1 capsule (100 mg total) by mouth 3 (three) times daily.  . insulin glargine (LANTUS) 100 unit/mL SOPN Inject 3 Units into the skin daily after breakfast.   . lidocaine (XYLOCAINE) 5 % ointment Apply QID to affected area as needed for pain.  Marland Kitchen lisinopril (ZESTRIL) 5 MG tablet   . mesalamine (LIALDA) 1.2 G EC tablet Take 2.4 g by mouth daily after breakfast.  . methylPREDNISolone (MEDROL DOSEPAK) 4 MG TBPK tablet Take as directed  . MORPHABOND ER 15 MG T12A   . mupirocin ointment (BACTROBAN) 2 % Apply to left ankle once daily and cover with dressing.  . nitroGLYCERIN (NITROSTAT) 0.4 MG SL tablet Place 1 tablet (0.4 mg total) under the tongue every 5 (five) minutes as needed for chest pain.  . Oxycodone HCl 10 MG TABS Take 10 mg by mouth 2 (two)  times daily.   . polyethylene glycol (MIRALAX / GLYCOLAX) packet Take 17 g by mouth daily as needed for mild constipation.  . Probiotic Product (ALIGN PO) Take 1 capsule by mouth daily after breakfast.  . ranitidine (ZANTAC) 150 MG capsule Take 1 capsule by mouth daily.  . simvastatin (ZOCOR) 10 MG tablet Take 10 mg by mouth at bedtime.   . [DISCONTINUED] levothyroxine (SYNTHROID) 88 MCG tablet TAKE 1 TABLET BY MOUTH ONCE DAILY ON AN EMPTY STOMACH IN THE MORNING     Allergies  Allergen Reactions  . Atorvastatin Nausea And Vomiting  . Byetta 10 Mcg Pen [Exenatide] Nausea And Vomiting  . Cefaclor Other (See Comments)    Pt does not remember the reaction  . Codeine Nausea And Vomiting         . Crestor [Rosuvastatin] Nausea And Vomiting  . Dexilant [Dexlansoprazole] Diarrhea  . Erythromycin Hives and Swelling  . Fosamax [Alendronate Sodium] Other (See Comments)    GI intolerance  . Macrobid [employer] Macro] Other (See Comments)    Pt does not remember the reaction  . Metformin And Related Nausea And Vomiting  . Nsaids     Elevated creatinine   . Penicillins Hives and Swelling    Has patient had a PCN reaction causing immediate rash, facial/tongue/throat swelling, SOB or lightheadedness with hypotension: Yes Has patient had a PCN reaction causing severe rash involving mucus membranes or skin necrosis: No Has patient had a PCN reaction that required hospitalization No Has patient had a PCN reaction occurring within the last 10 years: No If all of the above answers are "NO", then may proceed with Cephalosporin use.  . Tolmetin Other (See Comments)    Elevated creatinine   . Shellfish Allergy Nausea And Vomiting, Other (See Comments) and Rash    Felt spaced out Felt spaced out Felt spaced out    Social History   Socioeconomic History  . Marital status: Married    Spouse name: Not on file  . Number of children: Not on file  . Years of education: Not on file  . Highest education level: Not on file  Occupational History  . Not on file  Tobacco Use  . Smoking status: Current Some Day Smoker    Packs/day: 0.25    Types: Cigarettes  . Smokeless tobacco: Never Used  Substance and Sexual Activity  . Alcohol use: No  . Drug use: No  . Sexual activity: Not on file  Other Topics Concern  . Not on file  Social History Narrative  . Not on file   Social Determinants of Health   Financial Resource Strain:   . Difficulty of Paying Living Expenses: Not on file  Food Insecurity:   . Worried About Charity fundraiser in the Last Year: Not on file  . Ran Out of Food in the Last Year: Not on file  Transportation Needs:   . Lack of Transportation  (Medical): Not on file  . Lack of Transportation (Non-Medical): Not on file  Physical Activity:   . Days of Exercise per Week: Not on file  . Minutes of Exercise per Session: Not on file  Stress:   . Feeling of Stress : Not on file  Social Connections:   . Frequency of Communication with Friends and Family: Not on file  . Frequency of Social Gatherings with Friends and Family: Not on file  . Attends Religious Services: Not on file  . Active Member of Clubs or Organizations: Not  on file  . Attends Archivist Meetings: Not on file  . Marital Status: Not on file  Intimate Partner Violence:   . Fear of Current or Ex-Partner: Not on file  . Emotionally Abused: Not on file  . Physically Abused: Not on file  . Sexually Abused: Not on file     Review of Systems: General: negative for chills, fever, night sweats or weight changes.  Cardiovascular: negative for chest pain, dyspnea on exertion, edema, orthopnea, palpitations, paroxysmal nocturnal dyspnea or shortness of breath Dermatological: negative for rash Respiratory: negative for cough or wheezing Urologic: negative for hematuria Abdominal: negative for nausea, vomiting, diarrhea, bright red blood per rectum, melena, or hematemesis Neurologic: negative for visual changes, syncope, or dizziness All other systems reviewed and are otherwise negative except as noted above.    Blood pressure 140/60, pulse 77, height 5\' 4"  (1.626 m), weight 108 lb (49 kg), SpO2 99 %.  General appearance: alert and no distress Neck: no adenopathy, no JVD, supple, symmetrical, trachea midline, thyroid not enlarged, symmetric, no tenderness/mass/nodules and Soft high-pitched bruit right carotid Lungs: clear to auscultation bilaterally Heart: regular rate and rhythm, S1, S2 normal, no murmur, click, rub or gallop Extremities: extremities normal, atraumatic, no cyanosis or edema Pulses: Absent pedal pulses Skin: Bandage wound left lateral  malleolus Neurologic: Alert and oriented X 3, normal strength and tone. Normal symmetric reflexes. Normal coordination and gait  EKG not performed today  ASSESSMENT AND PLAN:   Critical limb ischemia with history of revascularization of same extremity Ms. Allshouse was referred to me by Dr. Jacqualyn Posey for evaluation treatment of critical limb ischemia.  She is a cardiology patient of Dr. Theodosia Blender.  She has a history of continued tobacco abuse, hypertension, hyperlipidemia and diabetes.  She has noncritical CAD.  She has had a left lateral malleolar ulcer has been getting worse despite aggressive local wound care.  Recent lower extremity arterial Doppler studies performed 03/30/2019 revealed a left ABI of 0.94 with a high-frequency signal in the mid left SFA.  This may be impairing her ability to heal her wound.  We will proceed with angiography potential endovascular therapy for limb salvage on Monday.      Lorretta Harp MD FACP,FACC,FAHA, [facility] 03/31/2019 1:41 PM

## 2019-03-31 NOTE — Progress Notes (Signed)
03/31/2019 Tammie West   Aug 07, 1932  [id number]  Primary Physician [name], MD Primary Cardiologist: Lorretta Harp MD Tammie West, [place]  HPI:  Tammie West is a 86 y.o. thin appearing married Caucasian female mother of 45 children, grandmother of 3 grandchildren who is accompanied by one of her daughters Tammie West.  She was referred to me by Dr. Jacqualyn Posey, her podiatrist for nonhealing ulcer on her left lateral malleolus.  She has a history of treated hypertension, diabetes and hyperlipidemia.  She does continue to smoke.  She has noncritical CAD by cath.  She has diastolic dysfunction as well.  She has complained of left calf claudication.  She developed a wound on her left lateral malleolus which has been getting worse despite aggressive local wound care with recent Dopplers that showed a high-frequency signal in her mid left SFA.   Current Meds  Medication Sig  . Accu-Chek FastClix Lancets MISC USE 1 LANCET TO CHECK BLOOD SUGAR 3 TIMES DAILY FOR TYPE 2 DIABETES MELLITUS ON INSULIN  . ACCU-CHEK SMARTVIEW test strip USE TO CHECK BLOOD SUGAR THREE TIMES DAILY  . allopurinol (ZYLOPRIM) 100 MG tablet Take 100 mg by mouth daily.  Marland Kitchen amitriptyline (ELAVIL) 25 MG tablet Take 25 mg by mouth at bedtime.  Marland Kitchen amLODipine (NORVASC) 5 MG tablet Take 5 mg by mouth daily.  Marland Kitchen aspirin EC 81 MG tablet Take 1 tablet (81 mg total) by mouth daily.  . BD PEN NEEDLE NANO U/F 32G X 4 MM MISC   . clidinium-chlordiazePOXIDE (LIBRAX) 5-2.5 MG capsule Take 1 capsule by mouth 2 (two) times daily.  . colchicine 0.6 MG tablet Take 0.6 mg by mouth 2 (two) times daily.  . diclofenac Sodium (VOLTAREN) 1 % GEL diclofenac 1 % topical gel  . diclofenac Sodium (VOLTAREN) 1 % GEL Apply 2 g topically 2 (two) times daily. Rub into affected area of foot 2 times daily  . diphenhydrAMINE (SOMINEX) 25 MG tablet Take by mouth.  . doxycycline (VIBRA-TABS) 100 MG tablet Take 1 tablet (100 mg total)  by mouth 2 (two) times daily.  Marland Kitchen doxycycline (VIBRA-TABS) 100 MG tablet Take 1 tablet (100 mg total) by mouth 2 (two) times daily.  . EMBEDA 20-0.8 MG CPCR   . escitalopram (LEXAPRO) 10 MG tablet Take 10 mg by mouth daily.  . EUTHYROX 75 MCG tablet TAKE ONE TABLET BY MOUTH 6 DAYS A WEEK BUT ONLY ONE HALF A TABLET ON SUNDAYS  . famotidine (PEPCID) 20 MG tablet TAKE 1 TABLET BY MOUTH ONCE DAILY AT BEDTIME AS NEEDED FOR 90 DAYS  . gabapentin (NEURONTIN) 100 MG capsule Take 1 capsule (100 mg total) by mouth 3 (three) times daily.  . insulin glargine (LANTUS) 100 unit/mL SOPN Inject 3 Units into the skin daily after breakfast.   . lidocaine (XYLOCAINE) 5 % ointment Apply QID to affected area as needed for pain.  Marland Kitchen lisinopril (ZESTRIL) 5 MG tablet   . mesalamine (LIALDA) 1.2 G EC tablet Take 2.4 g by mouth daily after breakfast.  . methylPREDNISolone (MEDROL DOSEPAK) 4 MG TBPK tablet Take as directed  . MORPHABOND ER 15 MG T12A   . mupirocin ointment (BACTROBAN) 2 % Apply to left ankle once daily and cover with dressing.  . nitroGLYCERIN (NITROSTAT) 0.4 MG SL tablet Place 1 tablet (0.4 mg total) under the tongue every 5 (five) minutes as needed for chest pain.  . Oxycodone HCl 10 MG TABS Take 10 mg by mouth 2 (two)  times daily.   . polyethylene glycol (MIRALAX / GLYCOLAX) packet Take 17 g by mouth daily as needed for mild constipation.  . Probiotic Product (ALIGN PO) Take 1 capsule by mouth daily after breakfast.  . ranitidine (ZANTAC) 150 MG capsule Take 1 capsule by mouth daily.  . simvastatin (ZOCOR) 10 MG tablet Take 10 mg by mouth at bedtime.   . [DISCONTINUED] levothyroxine (SYNTHROID) 88 MCG tablet TAKE 1 TABLET BY MOUTH ONCE DAILY ON AN EMPTY STOMACH IN THE MORNING     Allergies  Allergen Reactions  . Atorvastatin Nausea And Vomiting  . Byetta 10 Mcg Pen [Exenatide] Nausea And Vomiting  . Cefaclor Other (See Comments)    Pt does not remember the reaction  . Codeine Nausea And Vomiting        . Crestor [Rosuvastatin] Nausea And Vomiting  . Dexilant [Dexlansoprazole] Diarrhea  . Erythromycin Hives and Swelling  . Fosamax [Alendronate Sodium] Other (See Comments)    GI intolerance  . Macrobid [employer] Macro] Other (See Comments)    Pt does not remember the reaction  . Metformin And Related Nausea And Vomiting  . Nsaids     Elevated creatinine   . Penicillins Hives and Swelling    Has patient had a PCN reaction causing immediate rash, facial/tongue/throat swelling, SOB or lightheadedness with hypotension: Yes Has patient had a PCN reaction causing severe rash involving mucus membranes or skin necrosis: No Has patient had a PCN reaction that required hospitalization No Has patient had a PCN reaction occurring within the last 10 years: No If all of the above answers are "NO", then may proceed with Cephalosporin use.  . Tolmetin Other (See Comments)    Elevated creatinine   . Shellfish Allergy Nausea And Vomiting, Other (See Comments) and Rash    Felt spaced out Felt spaced out Felt spaced out    Social History   Socioeconomic History  . Marital status: Married    Spouse name: Not on file  . Number of children: Not on file  . Years of education: Not on file  . Highest education level: Not on file  Occupational History  . Not on file  Tobacco Use  . Smoking status: Current Some Day Smoker    Packs/day: 0.25    Types: Cigarettes  . Smokeless tobacco: Never Used  Substance and Sexual Activity  . Alcohol use: No  . Drug use: No  . Sexual activity: Not on file  Other Topics Concern  . Not on file  Social History Narrative  . Not on file   Social Determinants of Health   Financial Resource Strain:   . Difficulty of Paying Living Expenses: Not on file  Food Insecurity:   . Worried About Charity fundraiser in the Last Year: Not on file  . Ran Out of Food in the Last Year: Not on file  Transportation Needs:   . Lack of Transportation  (Medical): Not on file  . Lack of Transportation (Non-Medical): Not on file  Physical Activity:   . Days of Exercise per Week: Not on file  . Minutes of Exercise per Session: Not on file  Stress:   . Feeling of Stress : Not on file  Social Connections:   . Frequency of Communication with Friends and Family: Not on file  . Frequency of Social Gatherings with Friends and Family: Not on file  . Attends Religious Services: Not on file  . Active Member of Clubs or Organizations: Not on  file  . Attends Archivist Meetings: Not on file  . Marital Status: Not on file  Intimate Partner Violence:   . Fear of Current or Ex-Partner: Not on file  . Emotionally Abused: Not on file  . Physically Abused: Not on file  . Sexually Abused: Not on file     Review of Systems: General: negative for chills, fever, night sweats or weight changes.  Cardiovascular: negative for chest pain, dyspnea on exertion, edema, orthopnea, palpitations, paroxysmal nocturnal dyspnea or shortness of breath Dermatological: negative for rash Respiratory: negative for cough or wheezing Urologic: negative for hematuria Abdominal: negative for nausea, vomiting, diarrhea, bright red blood per rectum, melena, or hematemesis Neurologic: negative for visual changes, syncope, or dizziness All other systems reviewed and are otherwise negative except as noted above.    Blood pressure 140/60, pulse 77, height 5\' 4"  (1.626 m), weight 108 lb (49 kg), SpO2 99 %.  General appearance: alert and no distress Neck: no adenopathy, no JVD, supple, symmetrical, trachea midline, thyroid not enlarged, symmetric, no tenderness/mass/nodules and Soft high-pitched bruit right carotid Lungs: clear to auscultation bilaterally Heart: regular rate and rhythm, S1, S2 normal, no murmur, click, rub or gallop Extremities: extremities normal, atraumatic, no cyanosis or edema Pulses: Absent pedal pulses Skin: Bandage wound left lateral malleolus  Neurologic: Alert and oriented X 3, normal strength and tone. Normal symmetric reflexes. Normal coordination and gait  EKG not performed today  ASSESSMENT AND PLAN:   Critical limb ischemia with history of revascularization of same extremity Ms. Arbaugh was referred to me by Dr. Jacqualyn Posey for evaluation treatment of critical limb ischemia.  She is a cardiology patient of Dr. Theodosia Blender.  She has a history of continued tobacco abuse, hypertension, hyperlipidemia and diabetes.  She has noncritical CAD.  She has had a left lateral malleolar ulcer has been getting worse despite aggressive local wound care.  Recent lower extremity arterial Doppler studies performed 03/30/2019 revealed a left ABI of 0.94 with a high-frequency signal in the mid left SFA.  This may be impairing her ability to heal her wound.  We will proceed with angiography potential endovascular therapy for limb salvage on Monday.      Lorretta Harp MD FACP,FACC,FAHA, [facility] 03/31/2019 1:41 PM

## 2019-03-31 NOTE — Patient Instructions (Addendum)
Medication Instructions:  Continue current medications  *If you need a refill on your cardiac medications before your next appointment, please call your pharmacy*   Lab Work: BMP, CBC  If you have labs (blood work) drawn today and your tests are completely normal, you will receive your results only by: Marland Kitchen MyChart Message (if you have MyChart) OR . A paper copy in the mail If you have any lab test that is abnormal or we need to change your treatment, we will call you to review the results.   Testing/Procedures: Your physician has requested that you have a peripheral vascular angiogram. This exam is performed at the hospital. During this exam IV contrast is used to look at arterial blood flow. Please review the information sheet given for details.   Follow-Up: At [facility], you and your health needs are our priority.  As part of our continuing mission to provide you with exceptional heart care, we have created designated Provider Care Teams.  These Care Teams include your primary Cardiologist (physician) and Advanced Practice Providers (APPs -  Physician Assistants and Nurse Practitioners) who all work together to provide you with the care you need, when you need it.  We recommend signing up for the patient portal called "MyChart".  Sign up information is provided on this After Visit Summary.  MyChart is used to connect with patients for Virtual Visits (Telemedicine).  Patients are able to view lab/test results, encounter notes, upcoming appointments, etc.  Non-urgent messages can be sent to your provider as well.   To learn more about what you can do with MyChart, go to [email or web address].    Your next appointment:   1 Month  The format for your next appointment:   In Person  Provider:   Quay Burow, MD   Other Instructions    [place]  [id number] Dept: [phone] Loc: [place]  03/31/2019  You are scheduled for a Peripheral Angiogram on Monday, March 8 with Dr. Quay Burow.  1. Please arrive at the [facility] (Main Entrance A) at [facility]: [address] [place], [place] [address] at 9:30 AM (This time is two hours before your procedure to ensure your preparation). Free valet parking service is available.   Special note: Every effort is made to have your procedure done on time. Please understand that emergencies sometimes delay scheduled procedures.  2. Diet: Do not eat solid foods after midnight.  The patient may have clear liquids until 5am upon the day of the procedure.  3. Labs: You will need to have blood drawn on Thursday, March 4 at [facility]  Open: 8am - 5pm (Lunch 12:30 - 1:30)   Phone: [phone]. You do not need to be fasting.  4. Medication instructions in preparation for your procedure:   Contrast Allergy: No   On the morning of your procedure, take your Aspirin and any morning medicines NOT listed above.  You may use sips of water.  5. Plan for one night stay--bring personal belongings. 6. Bring a current list of your medications and current insurance cards. 7. You MUST have a responsible person to drive you home. 8. Someone MUST be with you the first 24 hours after you arrive home or your discharge will be delayed. 9. Please wear clothes that are easy to get on and off and wear slip-on shoes.  Thank  you for allowing [place] to care for you!   -- [facility]

## 2019-03-31 NOTE — Assessment & Plan Note (Signed)
Tammie West was referred to me by Dr. Jacqualyn Posey for evaluation treatment of critical limb ischemia.  She is a cardiology patient of Dr. Theodosia Blender.  She has a history of continued tobacco abuse, hypertension, hyperlipidemia and diabetes.  She has noncritical CAD.  She has had a left lateral malleolar ulcer has been getting worse despite aggressive local wound care.  Recent lower extremity arterial Doppler studies performed 03/30/2019 revealed a left ABI of 0.94 with a high-frequency signal in the mid left SFA.  This may be impairing her ability to heal her wound.  We will proceed with angiography potential endovascular therapy for limb salvage on Monday.

## 2019-03-31 NOTE — Progress Notes (Signed)
Subjective: 86 year old female presents the office today for follow-up evaluation of a wound to the left ankle on the lateral malleolus.  She states the wound is doing about the same.  She has been keeping Iodosorb on the wound daily.  She denies any significant drainage.  The swelling drainage on the bandage.  No pus.  No increase in swelling or redness.  She still gets pain to the ankle along the wound.  She is ready for arterial studies on Wednesday.  Objective: AAO x3, NAD DP, PT pulses palpable but decreased bilaterally.  CRT less than 3 seconds Ulceration of the lateral aspect of the ankle and the lateral malleolus distally with a granular base.  Measures 0.5 x 0.4 x 0.1 cm.  There is a small dark area in the central aspect there is no further bone, undermining or tunneling.  Minimal edema there is no significant erythema warmth.  No ascending cellulitis.  No pain with calf compression, swelling, warmth, erythema      Assessment: Ulceration left lateral malleolus  Plan: -All treatment options discussed with the patient including all alternatives, risks, complications.  -Debrided the hyperkeratotic tissue utilizing the 312 with scalpel to any complications on healthy tissue.  Continue with iodosorb for now and offloading at all times.  I ordered Hydrofera Blue to apply to the wound through [facility].  She is scheduled for arterial studies this week.  Based on that it within normal limits will likely refer to wound care center however if abnormal will refer for circulation. -Wound culture obtained. -Monitor for any clinical signs or symptoms of infection and directed to call the office immediately should any occur or go to the ER.  Return in about 1 week (around 04/04/2019).  Trula Slade DPM

## 2019-04-01 ENCOUNTER — Telehealth: Payer: Self-pay | Admitting: Podiatry

## 2019-04-01 LAB — CBC
Hematocrit: 35.1 % (ref 34.0–46.6)
Hemoglobin: 11.5 g/dL (ref 11.1–15.9)
MCH: 29.9 pg (ref 26.6–33.0)
MCHC: 32.8 g/dL (ref 31.5–35.7)
MCV: 91 fL (ref 79–97)
Platelets: 393 10*3/uL (ref 150–450)
RBC: 3.84 x10E6/uL (ref 3.77–5.28)
RDW: 13 % (ref 11.7–15.4)
WBC: 8.3 10*3/uL (ref 3.4–10.8)

## 2019-04-01 LAB — BASIC METABOLIC PANEL
BUN/Creatinine Ratio: 16 (ref 12–28)
BUN: 16 mg/dL (ref 8–27)
CO2: 23 mmol/L (ref 20–29)
Calcium: 9.8 mg/dL (ref 8.7–10.3)
Chloride: 105 mmol/L (ref 96–106)
Creatinine, Ser: 1 mg/dL (ref 0.57–1.00)
GFR calc Af Amer: 60 mL/min/{1.73_m2} (ref >59)
GFR calc non Af Amer: 52 mL/min/{1.73_m2} — ABNORMAL LOW (ref >59)
Glucose: 146 mg/dL — ABNORMAL HIGH (ref 65–99)
Potassium: 5 mmol/L (ref 3.5–5.2)
Sodium: 142 mmol/L (ref 134–144)

## 2019-04-01 LAB — SARS CORONAVIRUS 2 (TAT 6-24 HRS): SARS Coronavirus 2: NEGATIVE

## 2019-04-01 NOTE — Telephone Encounter (Signed)
Pt called to sat they are cx appointment due to her possibly having to be at [facility] due to a blockage. Please call patient/ she wanted to know she could still be seen after her sx.

## 2019-04-01 NOTE — Telephone Encounter (Signed)
Yes, I want to see her back after the procedure with Dr. [name]. She is schedule for Monday. I can see her back later in the week or the following Monday. Thanks.

## 2019-04-04 ENCOUNTER — Other Ambulatory Visit: Payer: Self-pay

## 2019-04-04 ENCOUNTER — Encounter: Payer: Self-pay | Admitting: *Deleted

## 2019-04-04 ENCOUNTER — Ambulatory Visit (HOSPITAL_COMMUNITY)
Admission: RE | Admit: 2019-04-04 | Discharge: 2019-04-04 | Disposition: A | Discharge status: Home / Self Care | Payer: Medicare HMO | Attending: Cardiovascular Disease | Admitting: Cardiovascular Disease

## 2019-04-04 ENCOUNTER — Encounter (HOSPITAL_COMMUNITY)
Admission: RE | Disposition: A | Discharge status: Home / Self Care | Payer: Self-pay | Source: Home / Self Care | Attending: Cardiovascular Disease

## 2019-04-04 DIAGNOSIS — E1151 Type 2 diabetes mellitus with diabetic peripheral angiopathy without gangrene: Secondary | ICD-10-CM | POA: Diagnosis not present

## 2019-04-04 DIAGNOSIS — I70212 Atherosclerosis of native arteries of extremities with intermittent claudication, left leg: Secondary | ICD-10-CM | POA: Diagnosis not present

## 2019-04-04 DIAGNOSIS — Z79899 Other long term (current) drug therapy: Secondary | ICD-10-CM | POA: Diagnosis not present

## 2019-04-04 DIAGNOSIS — I10 Essential (primary) hypertension: Secondary | ICD-10-CM | POA: Insufficient documentation

## 2019-04-04 DIAGNOSIS — L97329 Non-pressure chronic ulcer of left ankle with unspecified severity: Secondary | ICD-10-CM | POA: Insufficient documentation

## 2019-04-04 DIAGNOSIS — I70243 Atherosclerosis of native arteries of left leg with ulceration of ankle: Secondary | ICD-10-CM | POA: Insufficient documentation

## 2019-04-04 DIAGNOSIS — E785 Hyperlipidemia, unspecified: Secondary | ICD-10-CM | POA: Diagnosis not present

## 2019-04-04 DIAGNOSIS — Z794 Long term (current) use of insulin: Secondary | ICD-10-CM | POA: Diagnosis not present

## 2019-04-04 DIAGNOSIS — Z9889 Other specified postprocedural states: Secondary | ICD-10-CM | POA: Diagnosis present

## 2019-04-04 DIAGNOSIS — Z880 Allergy status to penicillin: Secondary | ICD-10-CM | POA: Insufficient documentation

## 2019-04-04 DIAGNOSIS — I70229 Atherosclerosis of native arteries of extremities with rest pain, unspecified extremity: Secondary | ICD-10-CM | POA: Diagnosis present

## 2019-04-04 DIAGNOSIS — Z886 Allergy status to analgesic agent status: Secondary | ICD-10-CM | POA: Diagnosis not present

## 2019-04-04 DIAGNOSIS — F17210 Nicotine dependence, cigarettes, uncomplicated: Secondary | ICD-10-CM | POA: Diagnosis not present

## 2019-04-04 DIAGNOSIS — Z888 Allergy status to other drugs, medicaments and biological substances status: Secondary | ICD-10-CM | POA: Diagnosis not present

## 2019-04-04 DIAGNOSIS — I998 Other disorder of circulatory system: Secondary | ICD-10-CM | POA: Diagnosis present

## 2019-04-04 DIAGNOSIS — E11621 Type 2 diabetes mellitus with foot ulcer: Secondary | ICD-10-CM | POA: Diagnosis not present

## 2019-04-04 DIAGNOSIS — Z7982 Long term (current) use of aspirin: Secondary | ICD-10-CM | POA: Insufficient documentation

## 2019-04-04 DIAGNOSIS — Z881 Allergy status to other antibiotic agents status: Secondary | ICD-10-CM | POA: Insufficient documentation

## 2019-04-04 DIAGNOSIS — I2510 Atherosclerotic heart disease of native coronary artery without angina pectoris: Secondary | ICD-10-CM | POA: Diagnosis not present

## 2019-04-04 DIAGNOSIS — Z885 Allergy status to narcotic agent status: Secondary | ICD-10-CM | POA: Insufficient documentation

## 2019-04-04 HISTORY — PX: PERIPHERAL VASCULAR INTERVENTION: CATH118257

## 2019-04-04 HISTORY — PX: LOWER EXTREMITY ANGIOGRAPHY: CATH118251

## 2019-04-04 LAB — GLUCOSE, CAPILLARY: Glucose-Capillary: 108 mg/dL — ABNORMAL HIGH (ref 70–99)

## 2019-04-04 LAB — POCT ACTIVATED CLOTTING TIME: Activated Clotting Time: 213 seconds

## 2019-04-04 SURGERY — LOWER EXTREMITY ANGIOGRAPHY
Anesthesia: LOCAL | Laterality: Left

## 2019-04-04 MED ORDER — ACETAMINOPHEN 325 MG PO TABS: 650.0000 mg | ORAL_TABLET | ORAL | Status: DC | PRN

## 2019-04-04 MED ORDER — HEPARIN (PORCINE) IN NACL 1000-0.9 UT/500ML-% IV SOLN
INTRAVENOUS | Status: AC
  Filled 2019-04-04: qty 500

## 2019-04-04 MED ORDER — LIDOCAINE HCL (PF) 1 % IJ SOLN
INTRAMUSCULAR | Status: AC
  Filled 2019-04-04: qty 30

## 2019-04-04 MED ORDER — CLOPIDOGREL BISULFATE 75 MG PO TABS: 75.0000 mg | ORAL_TABLET | Freq: Every day | ORAL | Status: DC

## 2019-04-04 MED ORDER — HEPARIN SODIUM (PORCINE) 1000 UNIT/ML IJ SOLN
INTRAMUSCULAR | Status: DC | PRN
  Administered 2019-04-04: 5000 [IU] via INTRAVENOUS
  Administered 2019-04-04: 3000 [IU] via INTRAVENOUS

## 2019-04-04 MED ORDER — HEPARIN (PORCINE) IN NACL 1000-0.9 UT/500ML-% IV SOLN
INTRAVENOUS | Status: DC | PRN
  Administered 2019-04-04: 500 mL

## 2019-04-04 MED ORDER — LABETALOL HCL 5 MG/ML IV SOLN: 10.0000 mg | INTRAVENOUS | Status: DC | PRN

## 2019-04-04 MED ORDER — SODIUM CHLORIDE 0.9% FLUSH: 3.0000 mL | Freq: Two times a day (BID) | INTRAVENOUS | Status: DC

## 2019-04-04 MED ORDER — SODIUM CHLORIDE 0.9 % IV SOLN: INTRAVENOUS | Status: DC

## 2019-04-04 MED ORDER — ONDANSETRON HCL 4 MG/2ML IJ SOLN: 4.0000 mg | Freq: Four times a day (QID) | INTRAMUSCULAR | Status: DC | PRN

## 2019-04-04 MED ORDER — SODIUM CHLORIDE 0.9 % IV SOLN: 250.0000 mL | INTRAVENOUS | Status: DC | PRN

## 2019-04-04 MED ORDER — IODIXANOL 320 MG/ML IV SOLN
INTRAVENOUS | Status: DC | PRN
  Administered 2019-04-04: 90 mL

## 2019-04-04 MED ORDER — HYDRALAZINE HCL 20 MG/ML IJ SOLN: 5.0000 mg | INTRAMUSCULAR | Status: DC | PRN

## 2019-04-04 MED ORDER — FENTANYL CITRATE (PF) 100 MCG/2ML IJ SOLN
INTRAMUSCULAR | Status: DC | PRN
  Administered 2019-04-04: 25 ug via INTRAVENOUS

## 2019-04-04 MED ORDER — HEPARIN SODIUM (PORCINE) 1000 UNIT/ML IJ SOLN
INTRAMUSCULAR | Status: AC
  Filled 2019-04-04: qty 1

## 2019-04-04 MED ORDER — CLOPIDOGREL BISULFATE 75 MG PO TABS: 75.0000 mg | ORAL_TABLET | Freq: Every day | ORAL | 1 refills | Status: DC

## 2019-04-04 MED ORDER — SODIUM CHLORIDE 0.9 % WEIGHT BASED INFUSION: 3.0000 mL/kg/h | INTRAVENOUS | Status: AC

## 2019-04-04 MED ORDER — MORPHINE SULFATE (PF) 2 MG/ML IV SOLN: 2.0000 mg | INTRAVENOUS | Status: DC | PRN

## 2019-04-04 MED ORDER — ASPIRIN EC 81 MG PO TBEC: 81.0000 mg | DELAYED_RELEASE_TABLET | Freq: Every day | ORAL | Status: DC

## 2019-04-04 MED ORDER — SODIUM CHLORIDE 0.9 % WEIGHT BASED INFUSION: 1.0000 mL/kg/h | INTRAVENOUS | Status: DC

## 2019-04-04 MED ORDER — LIDOCAINE HCL (PF) 1 % IJ SOLN
INTRAMUSCULAR | Status: DC | PRN
  Administered 2019-04-04: 30 mL

## 2019-04-04 MED ORDER — ASPIRIN 81 MG PO CHEW: 81.0000 mg | CHEWABLE_TABLET | ORAL | Status: DC

## 2019-04-04 MED ORDER — CLOPIDOGREL BISULFATE 300 MG PO TABS
ORAL_TABLET | ORAL | Status: DC | PRN
  Administered 2019-04-04: 300 mg via ORAL

## 2019-04-04 MED ORDER — SODIUM CHLORIDE 0.9% FLUSH: 3.0000 mL | INTRAVENOUS | Status: DC | PRN

## 2019-04-04 MED ORDER — FENTANYL CITRATE (PF) 100 MCG/2ML IJ SOLN
INTRAMUSCULAR | Status: AC
  Filled 2019-04-04: qty 2

## 2019-04-04 MED ORDER — CLOPIDOGREL BISULFATE 300 MG PO TABS
ORAL_TABLET | ORAL | Status: AC
  Filled 2019-04-04: qty 1

## 2019-04-04 SURGICAL SUPPLY — 25 items
BALLN CHOCOLATE 4.0X40X135 (BALLOONS) ×2
BALLN STERLING OTW 5X40X135 (BALLOONS) ×2
BALLOON CHOCOLATE 4.0X40X135 (BALLOONS) ×1 IMPLANT
BALLOON STERLING OTW 5X40X135 (BALLOONS) ×1 IMPLANT
CATH ANGIO 5F PIGTAIL 65CM (CATHETERS) ×2 IMPLANT
CATH CROSS OVER TEMPO 5F (CATHETERS) ×2 IMPLANT
CATH STRAIGHT 5FR 65CM (CATHETERS) ×2 IMPLANT
CLOSURE MYNX CONTROL 6F/7F (Vascular Products) ×2 IMPLANT
KIT ENCORE 26 ADVANTAGE (KITS) ×2 IMPLANT
KIT PV (KITS) ×2 IMPLANT
SHEATH FLEX ANSEL ANG 6F 45CM (SHEATH) ×2 IMPLANT
SHEATH PINNACLE 5F 10CM (SHEATH) ×2 IMPLANT
SHEATH PINNACLE 6F 10CM (SHEATH) ×2 IMPLANT
SHEATH PROBE COVER 6X72 (BAG) ×2 IMPLANT
STENT ELUVIA 6X40X130 (Permanent Stent) ×2 IMPLANT
STOPCOCK MORSE 400PSI 3WAY (MISCELLANEOUS) ×2 IMPLANT
SYR MEDRAD MARK 7 150ML (SYRINGE) ×2 IMPLANT
TAPE VIPERTRACK RADIOPAQ (MISCELLANEOUS) ×1 IMPLANT
TAPE VIPERTRACK RADIOPAQUE (MISCELLANEOUS) ×2
TRANSDUCER W/STOPCOCK (MISCELLANEOUS) ×2 IMPLANT
TRAY PV CATH (CUSTOM PROCEDURE TRAY) ×2 IMPLANT
TUBING CIL FLEX 10 FLL-RA (TUBING) ×2 IMPLANT
WIRE HITORQ VERSACORE ST 145CM (WIRE) ×2 IMPLANT
WIRE ROSEN-J .035X180CM (WIRE) ×2 IMPLANT
WIRE SPARTACORE .014X300CM (WIRE) ×2 IMPLANT

## 2019-04-04 NOTE — Telephone Encounter (Signed)
Pt daughter called to see if you still wanted to see her for her appt 04/05/19 since she has the blockage from her procedure on 03/08/21please advise

## 2019-04-04 NOTE — Discharge Instructions (Signed)
Femoral Site Care This sheet gives you information about how to care for yourself after your procedure. Your health care provider may also give you more specific instructions. If you have problems or questions, contact your health care provider. What can I expect after the procedure? After the procedure, it is common to have:  Bruising that usually fades within 1-2 weeks.  Tenderness at the site. Follow these instructions at home: Wound care  Follow instructions from your health care provider about how to take care of your insertion site. Make sure you: ? Wash your hands with soap and water before you change your bandage (dressing). If soap and water are not available, use hand sanitizer. ? Change your dressing as told by your health care provider. ? Leave stitches (sutures), skin glue, or adhesive strips in place. These skin closures may need to stay in place for 2 weeks or longer. If adhesive strip edges start to loosen and curl up, you may trim the loose edges. Do not remove adhesive strips completely unless your health care provider tells you to do that.  Do not take baths, swim, or use a hot tub until your health care provider approves.  You may shower 24-48 hours after the procedure or as told by your health care provider. ? Gently wash the site with plain soap and water. ? Pat the area dry with a clean towel. ? Do not rub the site. This may cause bleeding.  Do not apply powder or lotion to the site. Keep the site clean and dry.  Check your femoral site every day for signs of infection. Check for: ? Redness, swelling, or pain. ? Fluid or blood. ? Warmth. ? Pus or a bad smell. Activity  For the first 2-3 days after your procedure, or as long as directed: ? Avoid climbing stairs as much as possible. ? Do not squat.  Do not lift anything that is heavier than 10 lb (4.5 kg), or the limit that you are told, until your health care provider says that it is safe.  Rest as  directed. ? Avoid sitting for a long time without moving. Get up to take short walks every 1-2 hours.  Do not drive for 24 hours if you were given a medicine to help you relax (sedative). General instructions  Take over-the-counter and prescription medicines only as told by your health care provider.  Keep all follow-up visits as told by your health care provider. This is important. Contact a health care provider if you have:  A fever or chills.  You have redness, swelling, or pain around your insertion site. Get help right away if:  The catheter insertion area swells very fast.  You pass out.  You suddenly start to sweat or your skin gets clammy.  The catheter insertion area is bleeding, and the bleeding does not stop when you hold steady pressure on the area.  The area near or just beyond the catheter insertion site becomes pale, cool, tingly, or numb. These symptoms may represent a serious problem that is an emergency. Do not wait to see if the symptoms will go away. Get medical help right away. Call your local emergency services (911 in the [place] Do not drive yourself to the hospital. Summary  After the procedure, it is common to have bruising that usually fades within 1-2 weeks.  Check your femoral site every day for signs of infection.  Do not lift anything that is heavier than 10 lb (4.5 kg), or the   limit that you are told, until your health care provider says that it is safe. This information is not intended to replace advice given to you by your health care provider. Make sure you discuss any questions you have with your health care provider. Document Revised: 01/26/2017 Document Reviewed: 01/26/2017 Elsevier Patient Education  2020 Elsevier Inc.  

## 2019-04-04 NOTE — Interval H&P Note (Signed)
History and Physical Interval Note:  04/04/2019 10:29 AM  Tammie West  has presented today for surgery, with the diagnosis of Critical limb ischemia.  The various methods of treatment have been discussed with the patient and family. After consideration of risks, benefits and other options for treatment, the patient has consented to  Procedure(s): LOWER EXTREMITY ANGIOGRAPHY (Left) as a surgical intervention.  The patient's history has been reviewed, patient examined, no change in status, stable for surgery.  I have reviewed the patient's chart and labs.  Questions were answered to the patient's satisfaction.     Quay Burow

## 2019-04-04 NOTE — Progress Notes (Signed)
Discharge instructions reviewed with patient and daughter. Verbalized understanding.  

## 2019-04-05 ENCOUNTER — Ambulatory Visit: Payer: Medicare HMO | Admitting: Podiatry

## 2019-04-05 NOTE — Telephone Encounter (Signed)
Please advise. Thanks Tammie West 

## 2019-04-05 NOTE — Telephone Encounter (Signed)
Pts dghtr calling to follow up and see if they should cancel her appt for today. Pt had surgery yesterday to have a stint put in her leg and is not feeling up to coming to her appt unless Dr. Jacqualyn Posey believes she needs to. Please advise.

## 2019-04-05 NOTE — Telephone Encounter (Signed)
Please advise. Thanks [place]

## 2019-04-05 NOTE — Telephone Encounter (Signed)
Yes, please reschedule her for either later this week or next week. If she needs anything let me know.

## 2019-04-05 NOTE — Telephone Encounter (Signed)
I talked to Tammie West and had the patient rescheduled due to having a stint put in leg and lindsey is going to call the daughter. Tammie West

## 2019-04-11 ENCOUNTER — Telehealth: Payer: Self-pay | Admitting: Cardiovascular Disease

## 2019-04-11 DIAGNOSIS — I739 Peripheral vascular disease, unspecified: Secondary | ICD-10-CM

## 2019-04-11 NOTE — Telephone Encounter (Signed)
Returned the call to the patient's daughter per the dpr. The patient has a pv angiogram on 3/8 and will need a follow up duplex due to stent placement.  Orders for an ABI and Lower extremity have been placed and message sent to scheduling.  The daughter is also concerned about the non healing ulcer on the patient's left ankle and proper wound treatment. She stated that she called Dr. Leigh Aurora office and could not get the patient in until 04/28/19. She is concerned that this is not soon enough and would like to know if the patient needs to be seen sooner with Dr. [name] as well (apppointment 4/6).   She has been advised that if the patient develops a fever, discharge from the site, or any discoloration to call back. She has verbalized her understanding.   Message will be routed to Dr. [name] to see if he feels like the patient needs to be seen sooner post procedure.

## 2019-04-11 NOTE — Telephone Encounter (Signed)
I am happy to see sooner but the Dopplers need to be done prior to the office visit.

## 2019-04-11 NOTE — Telephone Encounter (Signed)
Patient's daughter Dietrich Pates calling stating the patient had a blockage removed on 3/8. She says they talked about following up with dopplers this week, but she has not heard anything since. She would like a call back to discuss this.

## 2019-04-12 NOTE — Telephone Encounter (Signed)
The patient's daughter stated that they will leave the appointments as they are for now. The duplexes are scheduled for 3/23. She has been advised to call back if anything further was needed.

## 2019-04-14 ENCOUNTER — Encounter: Payer: Self-pay | Admitting: *Deleted

## 2019-04-19 ENCOUNTER — Ambulatory Visit (HOSPITAL_COMMUNITY)
Admission: RE | Admit: 2019-04-19 | Discharge: 2019-04-19 | Disposition: A | Discharge status: Home / Self Care | Payer: Medicare HMO | Source: Ambulatory Visit | Attending: Cardiovascular Disease | Admitting: Cardiovascular Disease

## 2019-04-19 ENCOUNTER — Other Ambulatory Visit (HOSPITAL_COMMUNITY): Payer: Self-pay | Admitting: Cardiovascular Disease

## 2019-04-19 ENCOUNTER — Other Ambulatory Visit: Payer: Self-pay

## 2019-04-19 DIAGNOSIS — I739 Peripheral vascular disease, unspecified: Secondary | ICD-10-CM | POA: Diagnosis not present

## 2019-04-20 ENCOUNTER — Other Ambulatory Visit: Payer: Self-pay

## 2019-04-20 DIAGNOSIS — Z9889 Other specified postprocedural states: Secondary | ICD-10-CM

## 2019-04-20 DIAGNOSIS — L97329 Non-pressure chronic ulcer of left ankle with unspecified severity: Secondary | ICD-10-CM

## 2019-04-20 DIAGNOSIS — I739 Peripheral vascular disease, unspecified: Secondary | ICD-10-CM

## 2019-04-20 DIAGNOSIS — I998 Other disorder of circulatory system: Secondary | ICD-10-CM

## 2019-04-25 ENCOUNTER — Encounter: Payer: Self-pay | Admitting: Podiatry

## 2019-04-27 ENCOUNTER — Telehealth: Payer: Self-pay | Admitting: Radiology

## 2019-04-27 ENCOUNTER — Ambulatory Visit: Payer: Medicare HMO | Admitting: Cardiology

## 2019-04-27 ENCOUNTER — Encounter: Payer: Self-pay | Admitting: Cardiology

## 2019-04-27 ENCOUNTER — Other Ambulatory Visit: Payer: Self-pay

## 2019-04-27 ENCOUNTER — Other Ambulatory Visit: Payer: Self-pay | Admitting: Internal Medicine

## 2019-04-27 VITALS — BP 116/74 | HR 66 | Ht 66.0 in | Wt 107.2 lb

## 2019-04-27 DIAGNOSIS — I739 Peripheral vascular disease, unspecified: Secondary | ICD-10-CM | POA: Insufficient documentation

## 2019-04-27 DIAGNOSIS — E785 Hyperlipidemia, unspecified: Secondary | ICD-10-CM

## 2019-04-27 DIAGNOSIS — I5032 Chronic diastolic (congestive) heart failure: Secondary | ICD-10-CM

## 2019-04-27 DIAGNOSIS — R0989 Other specified symptoms and signs involving the circulatory and respiratory systems: Secondary | ICD-10-CM

## 2019-04-27 DIAGNOSIS — I10 Essential (primary) hypertension: Secondary | ICD-10-CM

## 2019-04-27 DIAGNOSIS — I2510 Atherosclerotic heart disease of native coronary artery without angina pectoris: Secondary | ICD-10-CM

## 2019-04-27 DIAGNOSIS — I340 Nonrheumatic mitral (valve) insufficiency: Secondary | ICD-10-CM | POA: Diagnosis not present

## 2019-04-27 DIAGNOSIS — R55 Syncope and collapse: Secondary | ICD-10-CM

## 2019-04-27 DIAGNOSIS — M810 Age-related osteoporosis without current pathological fracture: Secondary | ICD-10-CM

## 2019-04-27 NOTE — Progress Notes (Signed)
Cardiology Office Note:    Date:  04/27/2019   ID:  [name], DOB [birth year], MRN [id number]  PCP:  [name], MD  Cardiologist:  Fransico Him, MD    Referring MD: [name],*   Chief Complaint  Patient presents with  . Coronary Artery Disease  . Hypertension  . Congestive Heart Failure  . Mitral Regurgitation  . Hyperlipidemia    History of Present Illness:    Tammie West is a 86 y.o. female with a hx of HTN, nonobstructive ASCAD with Prox RCA to Mid RCA lesion, 45% stenosed and Ost LAD to Prox LAD lesion, 30% stenosed by cath and high calcium score by coronary CTA. She also has dyslipidemia, chronic diastolic CHF, moderate MR by echo 04/2016 and chronic LE edemaand continues with tobacco.  Since I saw her last she has had several episodes of "sinking spells"  Her PCP had placed her on Lisinopril and that was stopped.  She says that she is always standing up when these episodes happen.  She feels like the floor is coming up in her face and cannot move.  Her legs don't want to work and then has to sit down.  Her BS and BP have been fine during these episodes.  There are no significant palpitations with these but she has had some nausea.  She has not been eating well and is seeing GI.  She denies any chest pain or pressure before the events or SOB.  She has never had syncope.   Past Medical History:  Diagnosis Date  . Bilateral carpal tunnel syndrome 02/17/2017  . Carpal tunnel syndrome   . CKD (chronic kidney disease) stage 4, GFR 15-29 ml/min (HCC)   . Coronary artery disease 11/2014    cath with Prox RCA to Mid RCA lesion, 45% stenosed and Ost LAD to Prox LAD lesion, 30% stenosed byt recent cath and high calcium score by coronary CTA.  . Crohn's disease ([place])   . Depression   . Diabetes mellitus without complication ([place])   . GERD (gastroesophageal reflux disease)   . Goiter   . Hyperlipidemia   . Hypertension   .  Hypothyroidism   . Insomnia   . Migraine   . Mitral regurgitation    mild to moderate by echo 04/2017  . Osteoarthritis   . Osteoporosis   . PVD (peripheral vascular disease) ([place])    -s/p stenting of the 99% left mid SFA - followed by Dr. [name]  . Trochanteric bursitis     Past Surgical History:  Procedure Laterality Date  . ABDOMINAL HYSTERECTOMY    . APPENDECTOMY    . CARDIAC CATHETERIZATION N/A 12/13/2014   Procedure: Left Heart Cath and Coronary Angiography;  Surgeon: Belva Crome, MD;  Location: [facility];  Service: Cardiovascular;  Laterality: N/A;  . LOWER EXTREMITY ANGIOGRAPHY Left 04/04/2019   Procedure: LOWER EXTREMITY ANGIOGRAPHY;  Surgeon: Lorretta Harp, MD;  Location: [facility];  Service: Cardiovascular;  Laterality: Left;  . PERIPHERAL VASCULAR INTERVENTION Left 04/04/2019   Procedure: PERIPHERAL VASCULAR INTERVENTION;  Surgeon: Lorretta Harp, MD;  Location: [facility];  Service: Cardiovascular;  Laterality: Left;    Current Medications: Current Meds  Medication Sig  . Accu-Chek FastClix Lancets MISC USE 1 LANCET TO CHECK BLOOD SUGAR 3 TIMES DAILY FOR TYPE 2 DIABETES MELLITUS ON INSULIN  . ACCU-CHEK SMARTVIEW test strip USE TO CHECK BLOOD SUGAR THREE TIMES DAILY  . allopurinol (ZYLOPRIM) 100 MG  tablet Take 100 mg by mouth daily.  Marland Kitchen amitriptyline (ELAVIL) 25 MG tablet Take 25 mg by mouth at bedtime.  Marland Kitchen amLODipine (NORVASC) 5 MG tablet Take 5 mg by mouth daily.  Marland Kitchen aspirin EC 81 MG tablet Take 1 tablet (81 mg total) by mouth daily.  . BD PEN NEEDLE NANO U/F 32G X 4 MM MISC   . clidinium-chlordiazePOXIDE (LIBRAX) 5-2.5 MG capsule Take 1 capsule by mouth 2 (two) times daily.  . clopidogrel (PLAVIX) 75 MG tablet Take 1 tablet (75 mg total) by mouth daily.  Marland Kitchen escitalopram (LEXAPRO) 20 MG tablet Take 20 mg by mouth daily.  [name] 75 MCG tablet Take 75 mcg by mouth See admin instructions. Take 75 mg Mon-wed-Fri- Sun Take 37.5 mg on Tues.  Thurs. Saturday  . insulin glargine (LANTUS) 100 unit/mL SOPN Inject 5 Units into the skin daily after breakfast.   . lidocaine (XYLOCAINE) 5 % ointment Apply 1 application topically 4 (four) times daily as needed for mild pain.   . mesalamine (LIALDA) 1.2 G EC tablet Take 2.4 g by mouth daily after breakfast.  . MORPHABOND ER 15 MG T12A Take 15 mg by mouth in the morning and at bedtime.   . nitroGLYCERIN (NITROSTAT) 0.4 MG SL tablet Place 1 tablet (0.4 mg total) under the tongue every 5 (five) minutes as needed for chest pain.  . Oxycodone HCl 10 MG TABS Take 10 mg by mouth every 4 (four) hours as needed (Pain).   . polyethylene glycol (MIRALAX / GLYCOLAX) packet Take 17 g by mouth daily as needed for mild constipation.  . Probiotic Product (ALIGN) 4 MG CAPS Take 1 capsule by mouth daily after breakfast.   . simvastatin (ZOCOR) 10 MG tablet Take 10 mg by mouth at bedtime.      Allergies:   Atorvastatin, Byetta 10 mcg pen [exenatide], Cefaclor, Codeine, Crestor [rosuvastatin], Dexilant [dexlansoprazole], Erythromycin, Fosamax [alendronate sodium], Macrobid [nitrofurantoin monohyd macro], Metformin and related, Nsaids, Penicillins, Tolmetin, and Shellfish allergy   Social History   Socioeconomic History  . Marital status: Married    Spouse name: Not on file  . Number of children: Not on file  . Years of education: Not on file  . Highest education level: Not on file  Occupational History  . Not on file  Tobacco Use  . Smoking status: Current Some Day Smoker    Packs/day: 0.25    Types: Cigarettes  . Smokeless tobacco: Never Used  Substance and Sexual Activity  . Alcohol use: No  . Drug use: No  . Sexual activity: Not on file  Other Topics Concern  . Not on file  Social History Narrative  . Not on file   Social Determinants of Health   Financial Resource Strain:   . Difficulty of Paying Living Expenses:   Food Insecurity:   . Worried About Charity fundraiser in the Last Year:    . Arboriculturist in the Last Year:   Transportation Needs:   . Film/video editor (Medical):   Marland Kitchen Lack of Transportation (Non-Medical):   Physical Activity:   . Days of Exercise per Week:   . Minutes of Exercise per Session:   Stress:   . Feeling of Stress :   Social Connections:   . Frequency of Communication with Friends and Family:   . Frequency of Social Gatherings with Friends and Family:   . Attends Religious Services:   . Active Member of Clubs or Organizations:   .  Attends Archivist Meetings:   Marland Kitchen Marital Status:      Family History: The patient's family history includes CAD in her brother; Emphysema in her father; Heart disease in her brother. There is no history of Heart attack or Stroke.  ROS:   Please see the history of present illness.    ROS  All other systems reviewed and negative.   EKGs/Labs/Other Studies Reviewed:    The following studies were reviewed today: none  EKG:  EKG is  ordered today.  The ekg ordered today demonstrates NSR with PACs, RBBB, LAFB  Recent Labs: 01/07/2019: ALT 10 03/31/2019: BUN 16; Creatinine, Ser 1.00; Hemoglobin 11.5; Platelets 393; Potassium 5.0; Sodium 142   Recent Lipid Panel    Component Value Date/Time   CHOL 88 (L) 07/21/2018 1307   TRIG 68 07/21/2018 1307   HDL 51 07/21/2018 1307   CHOLHDL 1.7 07/21/2018 1307   CHOLHDL 2.0 01/08/2016 0941   VLDL 18 01/08/2016 0941   LDLCALC 23 07/21/2018 1307    Physical Exam:    VS:  BP 116/74   Pulse 66   Ht 5\' 6"  (1.676 m)   Wt 107 lb 3.2 oz (48.6 kg)   SpO2 99%   BMI 17.30 kg/m     Wt Readings from Last 3 Encounters:  04/27/19 107 lb 3.2 oz (48.6 kg)  04/04/19 106 lb (48.1 kg)  03/31/19 108 lb (49 kg)     GEN:  Well nourished, well developed in no acute distress HEENT: Normal NECK: No JVD; right carotid bruit LYMPHATICS: No lymphadenopathy CARDIAC: RRR, no murmurs, rubs, gallops RESPIRATORY:  Clear to auscultation without rales, wheezing or  rhonchi  ABDOMEN: Soft, non-tender, non-distended MUSCULOSKELETAL:  1+ pedal  edema; No deformity  SKIN: Warm and dry NEUROLOGIC:  Alert and oriented x 3 PSYCHIATRIC:  Normal affect   ASSESSMENT:    1. Coronary artery disease involving native coronary artery of native heart without angina pectoris   2. Essential hypertension   3. Chronic diastolic CHF (congestive heart failure) ([place])   4. Nonrheumatic mitral valve regurgitation   5. Hyperlipidemia with target LDL less than 70   6. PVD (peripheral vascular disease) ([place])   7. Pre-syncope   8. Bruit of right carotid artery    PLAN:    In order of problems listed above:  1.  ASCAD  -Cath in 2016 showed nonobstructive ASCAD with Prox RCA to Mid RCA lesion, 45% stenosed and Ost LAD to Prox LAD lesion, 30% stenosed and high calcium score by coronary CTA.   -she is doing well with no anginal sx -continue ASA 81mg  daily and statin  2.  HTN  -BP controlled on exam -continue Amlodipine 5mg  daily  3.  Chronic diastolic CHF  -she appears euvolemic on exam today -weight is stable -continue lasix 40mg  daily and compression hose for chronic LE edema -creatinine was 1 and K+ 5 on 03/31/2019  4.  MR  -mild on echo 06/2018  5.  Hyperlipidemia -LDL goal < 70 -LDL was 35 last fall -continue simvastatin 10mg  daily  6.  PVD -followed by Dr. [name] -s/p stenting of the 99% left mid SFA -continue ASA, Plavix and statin  7.  Pre syncope -unclear etiology -her episodes always occur standing so > orthostatic hypotension especially in light of the fact that she has had poor PO intake -Orthostatics: Lying 146/57 HR 65, Sitting 133/62 HR 62, Standing 117/60 HR 63 and after 2-3 minutes 140/62 HR 64 -Her BP and  BS have been checked during the episodes and reportedly have been normal -she does have a carotid bruit  -will repeat 2D echo to reassess LVF -30 day event monitor to assess for arrhythmias -Carotid dopplers given right carotid  bruit -I have encouraged her to wear her compression hose during the day and try to stay hydrated  8.  Right carotid bruit -suspect that she has carotid dz given her other PVD -check carotid dopplers  Medication Adjustments/Labs and Tests Ordered: Current medicines are reviewed at length with the patient today.  Concerns regarding medicines are outlined above.  Orders Placed This Encounter  Procedures  . EKG 12-Lead   No orders of the defined types were placed in this encounter.   Signed, Fransico Him, MD  04/27/2019 9:47 AM    [place]

## 2019-04-27 NOTE — Patient Instructions (Addendum)
Medication Instructions:  Your physician recommends that you continue on your current medications as directed. Please refer to the Current Medication list given to you today.  *If you need a refill on your cardiac medications before your next appointment, please call your pharmacy*  Testing/Procedures: Your physician has recommended that you wear an event monitor. Event monitors are medical devices that record the heart's electrical activity. Doctors most often [place] these monitors to diagnose arrhythmias. Arrhythmias are problems with the speed or rhythm of the heartbeat. The monitor is a small, portable device. You can wear one while you do your normal daily activities. This is usually used to diagnose what is causing palpitations/syncope (passing out).  Your physician has requested that you have an echocardiogram. Echocardiography is a painless test that uses sound waves to create images of your heart. It provides your doctor with information about the size and shape of your heart and how well your heart's chambers and valves are working. This procedure takes approximately one hour. There are no restrictions for this procedure.  Your physician has requested that you have a carotid duplex. This test is an ultrasound of the carotid arteries in your neck. It looks at blood flow through these arteries that supply the brain with blood. Allow one hour for this exam. There are no restrictions or special instructions.  Follow-Up: At [facility], you and your health needs are our priority.  As part of our continuing mission to provide you with exceptional heart care, we have created designated Provider Care Teams.  These Care Teams include your primary Cardiologist (physician) and Advanced Practice Providers (APPs -  Physician Assistants and Nurse Practitioners) who all work together to provide you with the care you need, when you need it.  Your next appointment:   1 year  The format for your next  appointment:   In person   Provider:   Dr. Radford Pax

## 2019-04-27 NOTE — Telephone Encounter (Signed)
Enrolled patient for a 30 day [employer] Event monitor to be mailed to patients home.  

## 2019-04-28 ENCOUNTER — Encounter: Payer: Self-pay | Admitting: Podiatry

## 2019-04-28 ENCOUNTER — Ambulatory Visit (INDEPENDENT_AMBULATORY_CARE_PROVIDER_SITE_OTHER): Payer: Medicare HMO

## 2019-04-28 ENCOUNTER — Ambulatory Visit: Payer: Medicare HMO | Admitting: Podiatry

## 2019-04-28 DIAGNOSIS — E1151 Type 2 diabetes mellitus with diabetic peripheral angiopathy without gangrene: Secondary | ICD-10-CM

## 2019-04-28 DIAGNOSIS — M86172 Other acute osteomyelitis, left ankle and foot: Secondary | ICD-10-CM

## 2019-04-28 DIAGNOSIS — L97329 Non-pressure chronic ulcer of left ankle with unspecified severity: Secondary | ICD-10-CM | POA: Diagnosis not present

## 2019-04-28 DIAGNOSIS — E1351 Other specified diabetes mellitus with diabetic peripheral angiopathy without gangrene: Secondary | ICD-10-CM | POA: Diagnosis not present

## 2019-04-28 MED ORDER — DOXYCYCLINE HYCLATE 100 MG PO TABS: 100.0000 mg | ORAL_TABLET | Freq: Two times a day (BID) | ORAL | 0 refills | Status: DC

## 2019-04-28 NOTE — Progress Notes (Signed)
Subjective: 86 year old female presents the office with her daughter for follow-up evaluation of an ulceration to the lateral malleolus.  She still having pain to the area is also started burning and pain into the foot in general.  She denies any drainage or pus.  They are concerned about osteomyelitis. Denies any systemic complaints such as fevers, chills, nausea, vomiting. No acute changes since last appointment, and no other complaints at this time.   ANGIO WITH DR. [name] Successful 99% mid left SFA stent stenosis PTA and drug-coated stent in the setting of critical limb ischemia with nonhealing left lateral malleolar ulcer.   Objective: AAO x3, NAD DP, PT pulses decreased.   Sensation decreased with Semmes Weinstein monofilament  Ulceration present the lateral aspect of the ankle and the lateral malleolus.  Hyperkeratotic tissue overlying the wound.  Upon debridement the wound measures about 0.4 x 0.4 x 0.2 cm.  There is no probing to bone, undermining or tunneling.  Mild surrounding erythema but there is no ascending cellulitis.  There is no fluctuation crepitation.  There is no malodor. No open lesions or pre-ulcerative lesions.  No pain with calf compression, swelling, warmth, erythema        Assessment: Ulceration left ankle  Plan: -All treatment options discussed with the patient including all alternatives, risks, complications.  -X-rays obtained reviewed.  No definitive evidence of acute fracture, osteomyelitis.  No soft tissue emphysema. -I debrided the wound to the left #312 with scalpel to remove nonviable tissue.  Debrided the wound down to healthy, viable tissue.  We will switch to Mepilex dressing changes daily which I dispensed.  If she is doing better with this she is to let me know next week and we can order this for her as well.  Continue offloading at all times.  Given the mild erythema will start doxycycline.  Also discussed wound care consult.  CT scan ordered to rule  osteomyelitis. -Patient encouraged to call the office with any questions, concerns, change in symptoms.   Trula Slade DPM

## 2019-05-02 ENCOUNTER — Telehealth: Payer: Self-pay | Admitting: *Deleted

## 2019-05-02 ENCOUNTER — Encounter: Payer: Self-pay | Admitting: Podiatry

## 2019-05-02 DIAGNOSIS — M868X7 Other osteomyelitis, ankle and foot: Secondary | ICD-10-CM

## 2019-05-02 DIAGNOSIS — L97329 Non-pressure chronic ulcer of left ankle with unspecified severity: Secondary | ICD-10-CM

## 2019-05-02 DIAGNOSIS — E1351 Other specified diabetes mellitus with diabetic peripheral angiopathy without gangrene: Secondary | ICD-10-CM

## 2019-05-02 DIAGNOSIS — E1151 Type 2 diabetes mellitus with diabetic peripheral angiopathy without gangrene: Secondary | ICD-10-CM

## 2019-05-02 NOTE — Telephone Encounter (Signed)
Orders to L. Cox, CMA for pre-cert, faxed to [facility]. Faxed required referral form, clinicals and demographics to [facility].

## 2019-05-02 NOTE — Telephone Encounter (Signed)
-----   Message from Trula Slade, DPM sent at 05/02/2019  8:09 AM EDT ----- Can you please order a CT scan of the ankle to rule out osteomyelitis of the ankle?   Also can you please put in for a wound care referral? Thanks.

## 2019-05-02 NOTE — Telephone Encounter (Signed)
I informed Ms Tammie West Dr. Leigh Aurora assistant, Lattie Haw  Would pre-cert and he did want her to go to the [facility].

## 2019-05-02 NOTE — Telephone Encounter (Signed)
Pt's dtr, Vertis Kelch states pt is scheduled 05/06/2019 for the CT and would like to make sure it is to be pre-cert, the last time she had to pay for her MRI, and wanted to know if she was to go to [facility].

## 2019-05-03 ENCOUNTER — Other Ambulatory Visit: Payer: Self-pay

## 2019-05-03 ENCOUNTER — Encounter: Payer: Self-pay | Admitting: Cardiovascular Disease

## 2019-05-03 ENCOUNTER — Ambulatory Visit: Payer: Medicare HMO | Admitting: Cardiovascular Disease

## 2019-05-03 VITALS — BP 142/54 | HR 68 | Ht 65.0 in | Wt 109.0 lb

## 2019-05-03 DIAGNOSIS — Z9889 Other specified postprocedural states: Secondary | ICD-10-CM

## 2019-05-03 DIAGNOSIS — I739 Peripheral vascular disease, unspecified: Secondary | ICD-10-CM | POA: Diagnosis not present

## 2019-05-03 DIAGNOSIS — I998 Other disorder of circulatory system: Secondary | ICD-10-CM | POA: Diagnosis not present

## 2019-05-03 DIAGNOSIS — Z959 Presence of cardiac and vascular implant and graft, unspecified: Secondary | ICD-10-CM | POA: Diagnosis not present

## 2019-05-03 DIAGNOSIS — I70229 Atherosclerosis of native arteries of extremities with rest pain, unspecified extremity: Secondary | ICD-10-CM

## 2019-05-03 NOTE — Progress Notes (Signed)
05/03/2019 Tammie West   September 30, 1932  [id number]  Primary Physician Tammie Cha, MD Primary Cardiologist: Tammie Harp MD Tammie West, [place]  HPI:  Tammie West is a 86 y.o.  thin appearing married Caucasian female mother of 20 children, grandmother of 3 grandchildren who is accompanied by one of her daughters Tammie West.  She was referred to me by Dr. Jacqualyn West, her podiatrist for nonhealing ulcer on her left lateral malleolus.    I last saw her in the office 03/31/2019.  She has a history of treated hypertension, diabetes and hyperlipidemia.  She does continue to smoke.  She has noncritical CAD by cath.  She has diastolic dysfunction as well.  She has complained of left calf claudication.  She developed a wound on her left lateral malleolus which has been getting worse despite aggressive local wound care with recent Dopplers that showed a high-frequency signal in her mid left SFA.  I performed peripheral angiography on her 04/04/2019 revealing a 99% mid left SFA stenosis with three-vessel runoff.  I performed PTA and drug-eluting stenting with an [place]uv[place] drug-eluting stent (6 mm x 40 mm).  She had excellent angiographic result.  Her Doppler studies normalized on 04/19/2019, her claudication resolved and her malleolar wound is significantly improved.   Current Meds  Medication Sig  . Accu-Chek FastClix Lancets MISC USE 1 LANCET TO CHECK BLOOD SUGAR 3 TIMES DAILY FOR TYPE 2 DIABETES MELLITUS ON INSULIN  . ACCU-CHEK SMARTVIEW test strip USE TO CHECK BLOOD SUGAR THREE TIMES DAILY  . allopurinol (ZYLOPRIM) 100 MG tablet Take 100 mg by mouth daily.  Marland Kitchen amitriptyline (ELAVIL) 25 MG tablet Take 25 mg by mouth at bedtime.  Marland Kitchen amLODipine (NORVASC) 5 MG tablet Take 5 mg by mouth daily.  Marland Kitchen aspirin EC 81 MG tablet Take 1 tablet (81 mg total) by mouth daily.  . BD PEN NEEDLE NANO U/F 32G X 4 MM MISC   . clidinium-chlordiazePOXIDE (LIBRAX) 5-2.5 MG capsule Take 1 capsule by mouth  2 (two) times daily.  . clopidogrel (PLAVIX) 75 MG tablet Take 1 tablet (75 mg total) by mouth daily.  Marland Kitchen doxycycline (VIBRA-TABS) 100 MG tablet Take 1 tablet (100 mg total) by mouth 2 (two) times daily.  Marland Kitchen escitalopram (LEXAPRO) 20 MG tablet Take 20 mg by mouth daily.  Tammie West 75 MCG tablet Take 75 mcg by mouth See admin instructions. Take 75 mg Mon-wed-Fri- Sun Take 37.5 mg on Tues. Thurs. Saturday  . insulin glargine (LANTUS) 100 unit/mL SOPN Inject 5 Units into the skin daily after breakfast.   . lidocaine (XYLOCAINE) 5 % ointment Apply 1 application topically 4 (four) times daily as needed for mild pain.   . mesalamine (LIALDA) 1.2 G EC tablet Take 2.4 g by mouth daily after breakfast.  . MORPHABOND ER 15 MG T12A Take 15 mg by mouth in the morning and at bedtime.   . nitroGLYCERIN (NITROSTAT) 0.4 MG SL tablet Place 1 tablet (0.4 mg total) under the tongue every 5 (five) minutes as needed for chest pain.  . Oxycodone HCl 10 MG TABS Take 10 mg by mouth every 4 (four) hours as needed (Pain).   . polyethylene glycol (MIRALAX / GLYCOLAX) packet Take 17 g by mouth daily as needed for mild constipation.  . Probiotic Product (ALIGN) 4 MG CAPS Take 1 capsule by mouth daily after breakfast.   . simvastatin (ZOCOR) 10 MG tablet Take 10 mg by mouth at bedtime.  Allergies  Allergen Reactions  . Atorvastatin Nausea And Vomiting  . Byetta 10 Mcg Pen [Exenatide] Nausea And Vomiting  . Cefaclor Other (See Comments)    Pt does not remember the reaction  . Codeine Nausea And Vomiting       . Crestor [Rosuvastatin] Nausea And Vomiting  . Dexilant [Dexlansoprazole] Diarrhea  . Erythromycin Hives and Swelling  . Fosamax [Alendronate Sodium] Other (See Comments)    GI intolerance  . Macrobid [employer] Macro] Other (See Comments)    Pt does not remember the reaction  . Metformin And Related Nausea And Vomiting  . Nsaids     Elevated creatinine   . Penicillins Hives and Swelling     Has patient had a PCN reaction causing immediate rash, facial/tongue/throat swelling, SOB or lightheadedness with hypotension: Yes Has patient had a PCN reaction causing severe rash involving mucus membranes or skin necrosis: No Has patient had a PCN reaction that required hospitalization No Has patient had a PCN reaction occurring within the last 10 years: No If all of the above answers are "NO", then may proceed with Cephalosporin use.  . Tolmetin Other (See Comments)    Elevated creatinine   . Shellfish Allergy Nausea And Vomiting, Other (See Comments) and Rash    Felt spaced out Felt spaced out Felt spaced out    Social History   Socioeconomic History  . Marital status: Married    Spouse name: Not on file  . Number of children: Not on file  . Years of education: Not on file  . Highest education level: Not on file  Occupational History  . Not on file  Tobacco Use  . Smoking status: Current Some Day Smoker    Packs/day: 0.25    Types: Cigarettes  . Smokeless tobacco: Never Used  Substance and Sexual Activity  . Alcohol use: No  . Drug use: No  . Sexual activity: Not on file  Other Topics Concern  . Not on file  Social History Narrative  . Not on file   Social Determinants of Health   Financial Resource Strain:   . Difficulty of Paying Living Expenses:   Food Insecurity:   . Worried About Charity fundraiser in the Last Year:   . Arboriculturist in the Last Year:   Transportation Needs:   . Film/video editor (Medical):   Marland Kitchen Lack of Transportation (Non-Medical):   Physical Activity:   . Days of Exercise per Week:   . Minutes of Exercise per Session:   Stress:   . Feeling of Stress :   Social Connections:   . Frequency of Communication with Friends and Family:   . Frequency of Social Gatherings with Friends and Family:   . Attends Religious Services:   . Active Member of Clubs or Organizations:   . Attends Archivist Meetings:   Marland Kitchen Marital  Status:   Intimate Partner Violence:   . Fear of Current or Ex-Partner:   . Emotionally Abused:   Marland Kitchen Physically Abused:   . Sexually Abused:      Review of Systems: General: negative for chills, fever, night sweats or weight changes.  Cardiovascular: negative for chest pain, dyspnea on exertion, edema, orthopnea, palpitations, paroxysmal nocturnal dyspnea or shortness of breath Dermatological: negative for rash Respiratory: negative for cough or wheezing Urologic: negative for hematuria Abdominal: negative for nausea, vomiting, diarrhea, bright red blood per rectum, melena, or hematemesis Neurologic: negative for visual changes, syncope, or dizziness  All other systems reviewed and are otherwise negative except as noted above.    Blood pressure (!) 142/54, pulse 68, height 5\' 5"  (1.651 m), weight 109 lb (49.4 kg).  General appearance: alert and no distress Neck: no adenopathy, no JVD, supple, symmetrical, trachea midline, thyroid not enlarged, symmetric, no tenderness/mass/nodules and Soft bilateral carotid bruits Lungs: clear to auscultation bilaterally Heart: regular rate and rhythm, S1, S2 normal, no murmur, click, rub or gallop Extremities: extremities normal, atraumatic, no cyanosis or edema Pulses: 2+ and symmetric Skin: Left lateral malleolar wound significantly improved Neurologic: Alert and oriented X 3, normal strength and tone. Normal symmetric reflexes. Normal coordination and gait  EKG not performed today  ASSESSMENT AND PLAN:   Critical limb ischemia with history of revascularization of same extremity Ms. Kluge returns today for her postop visit status post mid left SFA PTA and drug-eluting stenting for critical limb ischemia.  She had an [place]  drug-eluting stent placed.  She had three-vessel runoff.  Her left lateral malleolar ulcer is significantly improved and her claudication has resolved.  Her Doppler study shows marked improvement.      Tammie Harp MD FACP,FACC,FAHA, [facility] 05/03/2019 1:36 PM

## 2019-05-03 NOTE — Assessment & Plan Note (Signed)
Tammie West returns today for her postop visit status post mid left SFA PTA and drug-eluting stenting for critical limb ischemia.  She had an [place]  drug-eluting stent placed.  She had three-vessel runoff.  Her left lateral malleolar ulcer is significantly improved and her claudication has resolved.  Her Doppler study shows marked improvement.

## 2019-05-03 NOTE — Patient Instructions (Addendum)
Your physician recommends that you continue on your current medications as directed. Please refer to the Current Medication list given to you today.  Your physician has requested that you have a lower or upper extremity arterial duplex. This test is an ultrasound of the arteries in the legs or arms. It looks at arterial blood flow in the legs and arms. Allow one hour for Lower and Upper Arterial scans. There are no restrictions or special instructions LOWER   Your physician wants you to follow-up in: [place] will receive a reminder letter in the mail two months in advance. If you don't receive a letter, please call our office to schedule the follow-up appointment.

## 2019-05-05 ENCOUNTER — Telehealth: Payer: Self-pay | Admitting: *Deleted

## 2019-05-05 ENCOUNTER — Encounter (INDEPENDENT_AMBULATORY_CARE_PROVIDER_SITE_OTHER): Payer: Medicare HMO

## 2019-05-05 ENCOUNTER — Encounter: Payer: Self-pay | Admitting: Podiatry

## 2019-05-05 DIAGNOSIS — R55 Syncope and collapse: Secondary | ICD-10-CM | POA: Diagnosis not present

## 2019-05-05 NOTE — Telephone Encounter (Signed)
Ok, please let her know. If there is something I can do in the morning please call me

## 2019-05-05 NOTE — Telephone Encounter (Signed)
I spoke with Tammie West states the Case:  [id number] is processed by Carleene Overlie [phone] and transferred to [place] and she transferred to Nurse Arcadia S states the CT can not be expedited it would still take 1 business days.

## 2019-05-05 NOTE — Telephone Encounter (Signed)
Called and spoke with the representative from [facility]  and the procedure code 73700 needs a prior authorization and the case number is [id number] and sent the clinical notes to [phone]  Today for further review. Lattie Haw

## 2019-05-05 NOTE — Telephone Encounter (Signed)
Pt called states she is scheduled for CT tomorrow and needs to know if it will be pre-cert.

## 2019-05-06 ENCOUNTER — Inpatient Hospital Stay: Admission: RE | Admit: 2019-05-06 | Payer: Medicare HMO | Source: Ambulatory Visit

## 2019-05-13 ENCOUNTER — Inpatient Hospital Stay (HOSPITAL_COMMUNITY): Admission: RE | Admit: 2019-05-13 | Payer: Medicare HMO | Source: Ambulatory Visit

## 2019-05-13 ENCOUNTER — Encounter (HOSPITAL_COMMUNITY): Payer: Medicare HMO

## 2019-05-13 ENCOUNTER — Other Ambulatory Visit (HOSPITAL_COMMUNITY): Payer: Medicare HMO

## 2019-05-13 NOTE — Telephone Encounter (Signed)
I found a notification of denial for CT in file box for Dr. Jacqualyn Posey, requesting clarification from clinician by calling [phone] for Reference: [id number].

## 2019-05-16 ENCOUNTER — Encounter (HOSPITAL_BASED_OUTPATIENT_CLINIC_OR_DEPARTMENT_OTHER): Payer: Medicare HMO | Attending: Internal Medicine | Admitting: Internal Medicine

## 2019-05-16 ENCOUNTER — Other Ambulatory Visit: Payer: Self-pay

## 2019-05-16 DIAGNOSIS — E1122 Type 2 diabetes mellitus with diabetic chronic kidney disease: Secondary | ICD-10-CM | POA: Insufficient documentation

## 2019-05-16 DIAGNOSIS — I129 Hypertensive chronic kidney disease with stage 1 through stage 4 chronic kidney disease, or unspecified chronic kidney disease: Secondary | ICD-10-CM | POA: Diagnosis not present

## 2019-05-16 DIAGNOSIS — F17200 Nicotine dependence, unspecified, uncomplicated: Secondary | ICD-10-CM | POA: Diagnosis not present

## 2019-05-16 DIAGNOSIS — L97322 Non-pressure chronic ulcer of left ankle with fat layer exposed: Secondary | ICD-10-CM | POA: Insufficient documentation

## 2019-05-16 DIAGNOSIS — I2510 Atherosclerotic heart disease of native coronary artery without angina pectoris: Secondary | ICD-10-CM | POA: Insufficient documentation

## 2019-05-16 DIAGNOSIS — E1151 Type 2 diabetes mellitus with diabetic peripheral angiopathy without gangrene: Secondary | ICD-10-CM | POA: Insufficient documentation

## 2019-05-16 DIAGNOSIS — N189 Chronic kidney disease, unspecified: Secondary | ICD-10-CM | POA: Diagnosis not present

## 2019-05-17 ENCOUNTER — Ambulatory Visit: Payer: Medicare HMO | Admitting: Podiatry

## 2019-05-17 ENCOUNTER — Telehealth: Payer: Self-pay | Admitting: *Deleted

## 2019-05-17 DIAGNOSIS — B351 Tinea unguium: Secondary | ICD-10-CM

## 2019-05-17 DIAGNOSIS — Z7901 Long term (current) use of anticoagulants: Secondary | ICD-10-CM | POA: Diagnosis not present

## 2019-05-17 DIAGNOSIS — M79675 Pain in left toe(s): Secondary | ICD-10-CM | POA: Diagnosis not present

## 2019-05-17 DIAGNOSIS — E1351 Other specified diabetes mellitus with diabetic peripheral angiopathy without gangrene: Secondary | ICD-10-CM

## 2019-05-17 DIAGNOSIS — L84 Corns and callosities: Secondary | ICD-10-CM | POA: Diagnosis not present

## 2019-05-17 DIAGNOSIS — M79674 Pain in right toe(s): Secondary | ICD-10-CM

## 2019-05-17 NOTE — Telephone Encounter (Signed)
Called and spoke with Shyrl Numbers from [facility] [phone] and I stated that the CT was denied due to the insurance stating that if they did not do a contrast the doctor would not be able to see in detail of the picture and I stated to the representative that the patient has kidney failure and that is why Dr Jacqualyn Posey wanted to do the CT without contrast and the representative stated that the insurance could do a MRI without contrast and the procedure code 73721 was approved and the authorization number is [id number] and is valid 05-17-2019 thru 11-13-2019 and the case number is [id number] and I relayed the message to Dr Jacqualyn Posey. Lattie Haw

## 2019-05-17 NOTE — Progress Notes (Signed)
RESA, RINKS ([id number]) Visit Report for 05/16/2019 Allergy List Details Patient Name: Date of Service: Tammie West, Tammie West 05/16/2019 1:15 PM Medical Record [id number] Patient Account Number: [id number] Date of Birth/Sex: Treating RN: [birth year] (86 y.o. Tammie West Primary Care Tammie West: Tammie West Other Clinician: Referring Tammie West: Treating Tammie West/Extender:Tammie West in Treatment: 0 Allergies Active Allergies atorvastatin Severity: Moderate cefaclor Crestor Severity: Moderate erythromycin base Reaction: hives swelling Severity: Severe Macrobid penicillin Reaction: hives swelling Severity: Severe tolmetin Allergy Notes Electronic Signature(s) Signed: 05/17/2019 5:39:56 PM By: Carlene Coria RN Entered By: Carlene Coria on 05/16/2019 14:14:25 -------------------------------------------------------------------------------- Arrival Information Details Patient Name: Date of Service: Tammie Asa. 05/16/2019 1:15 PM Medical Record [id number] Patient Account Number: [id number] Date of Birth/Sex: Treating RN: 1932-02-09 (86 y.o. Tammie West Primary Care Tayli Buch: Other Clinician: Leeroy West Referring Demorio Seeley: Treating Tammie West/Extender:Robson, Tammie West, Tammie West in Treatment: 0 Visit Information Patient Arrived: Cane Arrival Time: 13:39 Accompanied By: self Transfer Assistance: None Patient Identification Verified: No Secondary Verification Process No Completed: Patient Has Alerts: Yes Patient Alerts: Patient on Blood Thinner L ABI: 1.21 TBI: 0.94 03/2019 Electronic Signature(s) Signed: 05/16/2019 6:02:31 PM By: Levan Hurst RN, BSN Entered By: Levan Hurst on 05/16/2019 14:40:48 -------------------------------------------------------------------------------- Clinic Level of Care Assessment Details Patient Name: Date of  Service: Tammie Asa. 05/16/2019 1:15 PM Medical Record [id number] Patient Account Number: [id number] Date of Birth/Sex: Treating RN: Feb 05, 1932 (86 y.o. Tammie West Primary Care Chrisoula Zegarra: Tammie West Other Clinician: Referring Braun Rocca: Treating Beckett Hickmon/Extender:Robson, Tammie West, Tammie West in Treatment: 0 Clinic Level of Care Assessment Items TOOL 1 Quantity Score X - Use when EandM and Procedure is performed on INITIAL visit 1 0 ASSESSMENTS - Nursing Assessment / Reassessment X - General Physical Exam (combine w/ comprehensive assessment (listed just below) 1 20 when performed on new pt. evals) X - Comprehensive Assessment (HX, ROS, Risk Assessments, Wounds Hx, etc.) 1 25 ASSESSMENTS - Wound and Skin Assessment / Reassessment []  - Dermatologic / Skin Assessment (not related to wound area) 0 ASSESSMENTS - Ostomy and/or Continence Assessment and Care []  - Incontinence Assessment and Management 0 []  - Ostomy Care Assessment and Management (repouching, etc.) 0 PROCESS - Coordination of Care X - Simple Patient / Family Education for ongoing care 1 15 []  - Complex (extensive) Patient / Family Education for ongoing care 0 X - Staff obtains Programmer, systems, Records, Test Results / Process Orders 1 10 X - Staff telephones HHA, Nursing Homes / Clarify orders / etc 1 10 []  - Routine Transfer to another Facility (non-emergent condition) 0 []  - Routine Hospital Admission (non-emergent condition) 0 X - New Admissions / Biomedical engineer / Ordering NPWT, Apligraf, etc. 1 15 []  - Emergency Hospital Admission (emergent condition) 0 PROCESS - Special Needs []  - Pediatric / Minor Patient Management 0 []  - Isolation Patient Management 0 []  - Hearing / Language / Visual special needs 0 []  - Assessment of Community assistance (transportation, D/C planning, etc.) 0 []  - Additional assistance / Altered mentation 0 []  - Support Surface(s) Assessment  (bed, cushion, seat, etc.) 0 INTERVENTIONS - Miscellaneous []  - External ear exam 0 []  - Patient Transfer (multiple staff / Civil Service fast streamer / Similar devices) 0 []  - Simple Staple / Suture removal (25 or less) 0 []  - Complex Staple / Suture removal (26 or more) 0 []  - Hypo/Hyperglycemic Management (do not check if billed separately) 0 []  - Ankle / Brachial Index (ABI) - do not check if billed separately 0 Has the patient  been seen at the hospital within the last three years: Yes Total Score: 95 Level Of Care: New/Established - Level 3 Electronic Signature(s) Signed: 05/16/2019 6:02:31 PM By: Levan Hurst RN, BSN Entered By: Levan Hurst on 05/16/2019 17:50:37 -------------------------------------------------------------------------------- Encounter Discharge Information Details Patient Name: Date of Service: Tammie Asa. 05/16/2019 1:15 PM Medical Record [id number] Patient Account Number: [id number] Date of Birth/Sex: Treating RN: 08/27/1932 (86 y.o. Tammie West Primary Care Kierre Deines: Tammie West Other Clinician: Referring Sinai Illingworth: Treating Tammie West/Extender:Robson, Tammie West, Tammie West in Treatment: 0 Encounter Discharge Information Items Post Procedure Vitals Discharge Condition: Stable Temperature (F): 98.2 Ambulatory Status: Cane Pulse (bpm): 63 Discharge Destination: Home Respiratory Rate (breaths/min): 18 Transportation: Private Auto Blood Pressure (mmHg): 120/84 Accompanied By: family member Schedule Follow-up Appointment: Yes Clinical Summary of Care: Patient Declined Electronic Signature(s) Signed: 05/16/2019 5:36:36 PM By: Tammie West Entered By: Tammie West on 05/16/2019 15:07:25 -------------------------------------------------------------------------------- Lower Extremity Assessment Details Patient Name: Date of Service: Tammie Asa. 05/16/2019 1:15 PM Medical Record [id number]  Patient Account Number: [id number] Date of Birth/Sex: Treating RN: Dec 12, 1932 (86 y.o. Tammie West Primary Care Tammie West: Tammie West Other Clinician: Referring Anel Purohit: Treating Tammie West/Extender:Robson, Tammie West, Tammie West in Treatment: 0 Edema Assessment Assessed: [Left: No] [Right: No] Edema: [Left: Ye] [Right: s] Calf Left: Right: Point of Measurement: 37 cm From Medial Instep 32 cm cm Ankle Left: Right: Point of Measurement: 10 cm From Medial Instep 21 cm cm Electronic Signature(s) Signed: 05/17/2019 5:39:56 PM By: Carlene Coria RN Entered By: Carlene Coria on 05/16/2019 13:58:16 -------------------------------------------------------------------------------- Multi Wound Chart Details Patient Name: Date of Service: Tammie Asa. 05/16/2019 1:15 PM Medical Record [id number] Patient Account Number: [id number] Date of Birth/Sex: Treating RN: 04-23-1932 (86 y.o. Tammie West Primary Care Kinesha Auten: Tammie West Other Clinician: Referring Citlaly Camplin: Treating Orine Goga/Extender:Robson, Tammie West, Tammie West in Treatment: 0 Vital Signs Height(in): 66 Pulse(bpm): 63 Weight(lbs): 107 Blood Pressure(mmHg): 120/84 Body Mass Index(BMI): 17 Temperature(F): 98.2 Respiratory 18 Rate(breaths/min): Photos: [1:No Photos] [N/A:N/A] Wound Location: [1:Left, Lateral Ankle] [N/A:N/A] Wounding Event: [1:Gradually Appeared] [N/A:N/A] Primary Etiology: [1:Venous Leg Ulcer] [N/A:N/A] Comorbid History: [1:Congestive Heart Failure, N/A Coronary Artery Disease, Hypertension, Peripheral Venous Disease, Type II Diabetes] Date Acquired: [1:12/03/2018] [N/A:N/A] West of Treatment: [1:0] [N/A:N/A] Wound Status: [1:Open] [N/A:N/A] Measurements L x W x D 0.5x0.5x0.2 [N/A:N/A] (cm) Area (cm) : [1:0.196] [N/A:N/A] Volume (cm) : [1:0.039] [N/A:N/A] % Reduction in Area: [1:0.00%] [N/A:N/A] % Reduction in Volume: 0.00%  [N/A:N/A] Classification: [1:Full Thickness Without Exposed Support Structures] [N/A:N/A] Exudate Amount: [1:Medium] [N/A:N/A] Exudate Type: [1:Serosanguineous] [N/A:N/A] Exudate Color: [1:red, brown] [N/A:N/A] Wound Margin: [1:Flat and Intact] [N/A:N/A] Granulation Amount: [1:None Present (0%)] [N/A:N/A] Necrotic Amount: [1:Large (67-100%)] [N/A:N/A] Exposed Structures: [1:Fat Layer (Subcutaneous N/A Tissue) Exposed: Yes Fascia: No Tendon: No Muscle: No Joint: No Bone: No] Debridement: [1:Debridement - Excisional N/A] Pre-procedure [1:14:40] [N/A:N/A] Verification/Time Out Taken: Pain Control: [1:Lidocaine 5% topical ointment] [N/A:N/A] Tissue Debrided: [1:Subcutaneous, Slough] [N/A:N/A] Level: [1:Skin/Subcutaneous Tissue] [N/A:N/A] Debridement Area (sq cm):0.25 [N/A:N/A] Instrument: [1:Curette] [N/A:N/A] Bleeding: [1:Minimum] [N/A:N/A] Hemostasis Achieved: [1:Pressure] [N/A:N/A] Procedural Pain: [1:3] [N/A:N/A] Post Procedural Pain: [1:0] [N/A:N/A] Debridement Treatment Procedure was tolerated [N/A:N/A] Response: [1:well] Post Debridement [1:0.5x0.5x0.2] [N/A:N/A] Measurements L x W x D (cm) Post Debridement [1:0.039] [N/A:N/A] Volume: (cm) Procedures Performed: Debridement [N/A:N/A] Treatment Notes Electronic Signature(s) Signed: 05/16/2019 5:34:04 PM By: [name] MD Signed: 05/16/2019 6:02:31 PM By: Levan Hurst RN, BSN Entered By: [name] on 05/16/2019 14:51:18 -------------------------------------------------------------------------------- Multi-Disciplinary Care Plan Details Patient Name: Date of Service: Tammie Asa. 05/16/2019 1:15 PM Medical  Record [id number] Patient Account Number: [id number] Date of Birth/Sex: Treating RN: 12-Jun-1932 (86 y.o. Tammie West Primary Care Laverle Pillard: Tammie West Other Clinician: Referring Kodie Kishi: Treating Jamison Yuhasz/Extender:Robson, Tammie West, Tammie West in Treatment:  0 Active Inactive Abuse / Safety / Falls / Self Care Management Nursing Diagnoses: Potential for falls Potential for injury related to falls Goals: Patient will remain injury free related to falls Date Initiated: 05/16/2019 Target Resolution Date: 06/17/2019 Goal Status: Active Patient/caregiver will verbalize/demonstrate measures taken to prevent injury and/or falls Date Initiated: 05/16/2019 Target Resolution Date: 06/17/2019 Goal Status: Active Interventions: Assess Activities of Daily Living upon admission and as needed Assess fall risk on admission and as needed Assess: immobility, friction, shearing, incontinence upon admission and as needed Assess impairment of mobility on admission and as needed per policy Assess personal safety and home safety (as indicated) on admission and as needed Assess self care needs on admission and as needed Provide education on fall prevention Notes: Wound/Skin Impairment Nursing Diagnoses: Impaired tissue integrity Knowledge deficit related to ulceration/compromised skin integrity Goals: Patient/caregiver will verbalize understanding of skin care regimen Date Initiated: 05/16/2019 Target Resolution Date: 06/17/2019 Goal Status: Active Ulcer/skin breakdown will have a volume reduction of 30% by week 4 Date Initiated: 05/16/2019 Target Resolution Date: 06/17/2019 Goal Status: Active Interventions: Assess patient/caregiver ability to obtain necessary supplies Assess patient/caregiver ability to perform ulcer/skin care regimen upon admission and as needed Assess ulceration(s) every visit Provide education on ulcer and skin care Notes: Electronic Signature(s) Signed: 05/16/2019 6:02:31 PM By: Levan Hurst RN, BSN Entered By: Levan Hurst on 05/16/2019 17:49:28 -------------------------------------------------------------------------------- Pain Assessment Details Patient Name: Date of Service: Tammie Asa 05/16/2019 1:15  PM Medical Record [id number] Patient Account Number: [id number] Date of Birth/Sex: Treating RN: 01/25/1933 (86 y.o. Tammie West Primary Care Ebony Rickel: Tammie West Other Clinician: Referring Haden Suder: Treating Brain Honeycutt/Extender:Robson, Tammie West, Tammie West in Treatment: 0 Active Problems Location of Pain Severity and Description of Pain Patient Has Paino No Site Locations Pain Management and Medication Current Pain Management: Electronic Signature(s) Signed: 05/17/2019 5:39:56 PM By: Carlene Coria RN Entered By: Carlene Coria on 05/16/2019 14:01:59 -------------------------------------------------------------------------------- Patient/Caregiver Education Details Lujean Amel 4/19/2021andnbsp1:15 Patient Name: Date of Service: M. PM Medical Record Patient Account Number: [id number] Number: Treating RN: Levan Hurst Date of Birth/Gender: 04-07-1932 (86 y.o. F) Other Clinician: Primary Care Treating Varadarajan, Hilton [place] Physician: Physician/Extender: Referring Physician: Ouida Sills in Treatment: 0 Education Assessment Education Provided To: Patient Education Topics Provided Safety: Methods: Explain/Verbal Responses: State content correctly Wound/Skin Impairment: Methods: Explain/Verbal Responses: State content correctly Electronic Signature(s) Signed: 05/16/2019 6:02:31 PM By: Levan Hurst RN, BSN Entered By: Levan Hurst on 05/16/2019 17:49:37 -------------------------------------------------------------------------------- Wound Assessment Details Patient Name: Date of Service: Tammie Asa 05/16/2019 1:15 PM Medical Record [id number] Patient Account Number: [id number] Date of Birth/Sex: Treating RN: 02-20-1932 (86 y.o. Tammie West Primary Care Brandin Stetzer: Tammie West Other Clinician: Referring Janazia Schreier: Treating Charron Coultas/Extender:Robson,  Tammie West, Tammie West in Treatment: 0 Wound Status Wound Number: 1 Primary Venous Leg Ulcer Etiology: Wound Location: Left, Lateral Ankle Wound Open Wounding Event: Gradually Appeared Status: Date Acquired: 12/03/2018 Comorbid Congestive Heart Failure, Coronary Artery West Of Treatment: 0 History: Disease, Hypertension, Peripheral Venous Clustered Wound: No Disease, Type II Diabetes Wound Measurements Length: (cm) 0.5 % Reduct Width: (cm) 0.5 % Reduct Depth: (cm) 0.2 Tunnelin Area: (cm) 0.196 Undermi Volume: (cm) 0.039 Wound Description Classification: Full Thickness Without Exposed Support Foul Odo Structures Slough/F Wound Flat and Intact Margin: Exudate Medium Amount: Exudate  Serosanguineous Type: Exudate red, brown Color: Wound Bed Granulation Amount: None Present (0%) Necrotic Amount: Large (67-100%) Fascia E Necrotic Quality: Adherent Slough Fat Laye Tendon E Muscle E Joint Ex Bone Exp r After Cleansing: No ibrino Yes Exposed Structure xposed: No r (Subcutaneous Tissue) Exposed: Yes xposed: No xposed: No posed: No osed: No ion in Area: 0% ion in Volume: 0% g: No ning: No Treatment Notes Wound #1 (Left, Lateral Ankle) 1. Cleanse With Wound Cleanser 2. Periwound Care Skin Prep 3. Primary Dressing Applied Santyl 4. Secondary Dressing Foam Border Dressing Electronic Signature(s) Signed: 05/16/2019 6:02:31 PM By: Levan Hurst RN, BSN Signed: 05/17/2019 5:39:56 PM By: Carlene Coria RN Entered By: Levan Hurst on 05/16/2019 14:45:52 -------------------------------------------------------------------------------- [place] Details Patient Name: Date of Service: Tammie Asa. 05/16/2019 1:15 PM Medical Record [id number] Patient Account Number: [id number] Date of Birth/Sex: Treating RN: 12-14-1932 (86 y.o. Tammie West Primary Care Linnaea Ahn: Tammie West Other Clinician: Referring Kazuto Sevey: Treating  Arlesia Kiel/Extender:Robson, Tammie West, Tammie West in Treatment: 0 Vital Signs Time Taken: 13:40 Temperature (F): 98.2 Height (in): 66 Pulse (bpm): 63 Source: Stated Respiratory Rate (breaths/min): 18 Weight (lbs): 107 Blood Pressure (mmHg): 120/84 Source: Stated Reference Range: 80 - 120 mg / dl Body Mass Index (BMI): 17.3 Electronic Signature(s) Signed: 05/17/2019 5:39:56 PM By: Carlene Coria RN Entered By: Carlene Coria on 05/16/2019 13:40:42

## 2019-05-17 NOTE — Progress Notes (Signed)
Tammie West, Tammie West ([id number]) Visit Report for 05/16/2019 Abuse/Suicide Risk Screen Details Patient Name: Date of Service: Tammie West, Tammie West 05/16/2019 1:15 PM Medical Record [id number] Patient Account Number: [id number] Date of Birth/Sex: Treating RN: 11-16-1932 (86 y.o. Orvan Falconer Primary Care Liberato Stansbery: [name] Other Clinician: Referring Exa Bomba: Treating Haydn Cush/Extender:Robson, Birdena Crandall, Jake Samples in Treatment: 0 Abuse/Suicide Risk Screen Items Answer ABUSE RISK SCREEN: Has anyone close to you tried to hurt or harm you recentlyo No Do you feel uncomfortable with anyone in your familyo No Has anyone forced you do things that you didnt want to doo No Electronic Signature(s) Signed: 05/17/2019 5:39:56 PM By: Carlene Coria RN Entered By: Carlene Coria on 05/16/2019 13:46:32 -------------------------------------------------------------------------------- Activities of Daily Living Details Patient Name: Date of Service: Tammie West, Tammie West 05/16/2019 1:15 PM Medical Record [id number] Patient Account Number: [id number] Date of Birth/Sex: Treating RN: Oct 17, 1932 (86 y.o. Orvan Falconer Primary Care Matteo Banke: [name] Other Clinician: Referring Sirron Francesconi: Treating Reegan Bouffard/Extender:Robson, Birdena Crandall, Jake Samples in Treatment: 0 Activities of Daily Living Items Answer Activities of Daily Living (Please select one for each item) Drive Automobile Not Able Take Medications Completely Able Use Telephone Completely Able Care for Appearance Completely Able Use Toilet Completely Able Bath / Shower Completely Able Dress Self Completely Able Feed Self Completely Able Walk Completely Able Get In / Out Bed Completely Able Housework Not Able Prepare Meals Not Swink for Self Not Able Electronic Signature(s) Signed: 05/17/2019 5:39:56 PM By: Carlene Coria RN Entered  By: Carlene Coria on 05/16/2019 13:47:21 -------------------------------------------------------------------------------- Education Screening Details Patient Name: Date of Service: Tammie West 05/16/2019 1:15 PM Medical Record [id number] Patient Account Number: [id number] Date of Birth/Sex: Treating RN: 15-Aug-1932 (86 y.o. Orvan Falconer Primary Care Ieisha Gao: [name] Other Clinician: Referring Lottie Siska: Treating Ruben Pyka/Extender:Robson, Birdena Crandall, Jake Samples in Treatment: 0 Primary Learner Assessed: Patient Learning Preferences/Education Level/Primary Language Learning Preference: Explanation Highest Education Level: Grade School Preferred Language: English Cognitive Barrier Language Barrier: No Translator Needed: No Memory Deficit: No Emotional Barrier: No Cultural/Religious Beliefs Affecting Medical Care: No Physical Barrier Impaired Vision: No Impaired Hearing: No Decreased Hand dexterity: No Knowledge/Comprehension Knowledge Level: Medium Comprehension Level: High Ability to understand written High instructions: Ability to understand verbal High instructions: Motivation Anxiety Level: Calm Cooperation: Cooperative Education Importance: Acknowledges Need Interest in Health Problems: Asks Questions Perception: Coherent Willingness to Engage in Self- High Management Activities: Readiness to Engage in Self- High Management Activities: Electronic Signature(s) Signed: 05/17/2019 5:39:56 PM By: Carlene Coria RN Entered By: Carlene Coria on 05/16/2019 13:47:54 -------------------------------------------------------------------------------- Fall Risk Assessment Details Patient Name: Date of Service: Tammie West 05/16/2019 1:15 PM Medical Record [id number] Patient Account Number: [id number] Date of Birth/Sex: Treating RN: 25-Sep-1932 (86 y.o. Orvan Falconer Primary Care Deisi Salonga: [name] Other Clinician: Referring Tevyn Codd: Treating Mansi Tokar/Extender:Robson, Birdena Crandall, Jake Samples in Treatment: 0 Fall Risk Assessment Items Have you had 2 or more falls in the last 12 monthso 0 No Have you had any fall that resulted in injury in the last 12 monthso 0 No FALLS RISK SCREEN History of falling - immediate or within 3 months 0 No Secondary diagnosis (Do you have 2 or more medical diagnoseso) 0 No Ambulatory aid None/bed rest/wheelchair/nurse 0 No Crutches/cane/walker 0 No Furniture 0 No Intravenous therapy Access/Saline/Heparin Lock 0 No Weak (short steps with or without shuffle, stooped but able to lift head 0 No while walking, may seek support from furniture) Impaired (short steps with shuffle, may have difficulty  arising from chair, 0 No head down, impaired balance) Mental Status Oriented to own ability 0 No Overestimates or forgets limitations 0 No Risk Level: Low Risk Score: 0 Electronic Signature(s) Signed: 05/17/2019 5:39:56 PM By: Carlene Coria RN Entered By: Carlene Coria on 05/16/2019 13:48:04 -------------------------------------------------------------------------------- Foot Assessment Details Patient Name: Date of Service: Tammie West. 05/16/2019 1:15 PM Medical Record [id number] Patient Account Number: [id number] Date of Birth/Sex: Treating RN: 1932-04-21 (86 y.o. Orvan Falconer Primary Care Amandajo Gonder: [name] Other Clinician: Referring Takenya Travaglini: Treating Jeremiyah Cullens/Extender:Robson, Birdena Crandall, Jake Samples in Treatment: 0 Foot Assessment Items Site Locations + = Sensation present, - = Sensation absent, C = Callus, U = Ulcer R = Redness, W = Warmth, M = Maceration, PU = Pre-ulcerative lesion F = Fissure, S = Swelling, D = Dryness Assessment Right: Left: Other Deformity: No No Prior Foot Ulcer: No No Prior Amputation: No No Charcot Joint: No No Ambulatory Status: Ambulatory Without  Help Gait: Steady Electronic Signature(s) Signed: 05/17/2019 5:39:56 PM By: Carlene Coria RN Entered By: Carlene Coria on 05/16/2019 13:56:56 -------------------------------------------------------------------------------- Nutrition Risk Screening Details Patient Name: Date of Service: Tammie West, Tammie West 05/16/2019 1:15 PM Medical Record [id number] Patient Account Number: [id number] Date of Birth/Sex: Treating RN: [birth year] (86 y.o. Orvan Falconer Primary Care Kenzleigh Sedam: [name] Other Clinician: Referring Henri Baumler: Treating Jahari Wiginton/Extender:Robson, Birdena Crandall, Jake Samples in Treatment: 0 Height (in): 66 Weight (lbs): 107 Body Mass Index (BMI): 17.3 Nutrition Risk Screening Items Score Screening NUTRITION RISK SCREEN: I have an illness or condition that made me change the kind and/or 0 No amount of food I eat I eat fewer than two meals per day 0 No I eat few fruits and vegetables, or milk products 0 No I have three or more drinks of beer, liquor or wine almost every day 0 No I have tooth or mouth problems that make it hard for me to eat 0 No I don't always have enough money to buy the food I need 0 No I eat alone most of the time 0 No I take three or more different prescribed or over-the-counter drugs a day 1 Yes 2 Yes Without wanting to, I have lost or gained 10 pounds in the last six months I am not always physically able to shop, cook and/or feed myself 0 No Nutrition Protocols Good Risk Protocol Provide education on Moderate Risk Protocol 0 nutrition High Risk Proctocol Risk Level: Moderate Risk Score: 3 Electronic Signature(s) Signed: 05/17/2019 5:39:56 PM By: Carlene Coria RN Entered By: Carlene Coria on 05/16/2019 13:48:37

## 2019-05-17 NOTE — Progress Notes (Signed)
Tammie West, Tammie West ([id number]) Visit Report for 05/16/2019 Chief Complaint Document Details Patient Name: Date of Service: Tammie West, Tammie West 05/16/2019 1:15 PM Medical Record [id number] Patient Account Number: [id number] Date of Birth/Sex: Treating RN: 02-Feb-1932 (86 y.o. [name] Primary Care Provider: [name] Other Clinician: Referring Provider: Treating Provider/Extender:Keion Neels, Birdena Crandall, Jake Samples in Treatment: 0 Information Obtained from: Patient Chief Complaint 05/16/2019; patient is here for review of a wound on the left lateral malleolus Electronic Signature(s) Signed: 05/16/2019 5:34:04 PM By: [name] MD Entered By: [name] on 05/16/2019 14:52:11 -------------------------------------------------------------------------------- Debridement Details Patient Name: Tammie West. Date of Service: 05/16/2019 1:15 PM Medical Record [id number] Patient Account Number: [id number] Date of Birth/Sex: Treating RN: Dec 06, 1932 (86 y.o. [name] Primary Care Provider: [name] Other Clinician: Referring Provider: Treating Provider/Extender:Amadou Katzenstein, Birdena Crandall, Jake Samples in Treatment: 0 Debridement Performed for Wound #1 Left,Lateral Ankle Assessment: Performed By: Physician Ricard Dillon., MD Debridement Type: Debridement Severity of Tissue Pre Fat layer exposed Debridement: Level of Consciousness (Pre- Awake and Alert procedure): Pre-procedure Verification/Time Out Taken: Yes - 14:40 Start Time: 14:40 Pain Control: Lidocaine 5% topical ointment Total Area Debrided (L x W): 0.5 (cm) x 0.5 (cm) = 0.25 (cm) Tissue and other material Viable, Non-Viable, Slough, Subcutaneous, Slough debrided: Level: Skin/Subcutaneous Tissue Debridement Description: Excisional Instrument: Curette Bleeding: Minimum Hemostasis Achieved: Pressure End Time: 14:42 Procedural  Pain: 3 Post Procedural Pain: 0 Response to Treatment: Procedure was tolerated well Level of Consciousness Awake and Alert (Post-procedure): Post Debridement Measurements of Total Wound Length: (cm) 0.5 Width: (cm) 0.5 Depth: (cm) 0.2 Volume: (cm) 0.039 Character of Wound/Ulcer Post Improved Debridement: Severity of Tissue Post Debridement: Fat layer exposed Post Procedure Diagnosis Same as Pre-procedure Electronic Signature(s) Signed: 05/16/2019 5:34:04 PM By: [name] MD Signed: 05/16/2019 6:02:31 PM By: Levan Hurst RN, BSN Entered By: [name] on 05/16/2019 14:51:56 -------------------------------------------------------------------------------- HPI Details Patient Name: Date of Service: Tammie West. 05/16/2019 1:15 PM Medical Record [id number] Patient Account Number: [id number] Date of Birth/Sex: Treating RN: 02/03/1932 (86 y.o. [name] Primary Care Provider: [name] Other Clinician: Referring Provider: Treating Provider/Extender:Tylie Golonka, Birdena Crandall, Jake Samples in Treatment: 0 History of Present Illness HPI Description: ADMISSION 05/16/2019 This is an 86 year old woman with type 2 diabetes. She developed a wound on her left lateral ankle last November. She was seen and followed by podiatry at [facility]. Nonhealing status and increasing pain. She went on to have noninvasive arterial studies that showed a 75 to 99% distal SFA stenosis. She underwent an angiogram on 04/04/2019 and they were able to stent the left SFA. Marked improvement post revascularization. A recent x-ray of the ankle did not show osteomyelitis. A CT scan was ordered but apparently they been unable to get prior authorization through [employer] which is her insurance. They were using Mepilex. She received a course of doxycycline at the beginning of the month. Noted by Dr. [name] at his last visit to have three-vessel runoff with an ABI  of 1.29 on the right and 1.21 on the left. Past medical history; type 2 diabetes gout chronic pain PAD, CAD hypertension and chronic renal failure. Her ABIs were not repeated in our clinic Electronic Signature(s) Signed: 05/16/2019 5:34:04 PM By: [name] MD Entered By: [name] on 05/16/2019 14:54:25 -------------------------------------------------------------------------------- Physical Exam Details Patient Name: Date of Service: Tammie West 05/16/2019 1:15 PM Medical Record [id number] Patient Account Number: [id number] Date of Birth/Sex: Treating RN: [birth year] (86 y.o. [name] Primary  Care Provider: [name] Other Clinician: Referring Provider: Treating Provider/Extender:Caliyah Sieh, Birdena Crandall, Jake Samples in Treatment: 0 Constitutional Sitting or standing Blood Pressure is within target range for patient.. Pulse regular and within target range for patient.Marland Kitchen Respirations regular, non-labored and within target range.. Temperature is normal and within the target range for the patient.Marland Kitchen Appears in no distress. Respiratory work of breathing is normal. Bilateral breath sounds are clear and equal in all lobes with no wheezes, rales or rhonchi.. Cardiovascular Heart rhythm and rate regular, without murmur or gallop. Has an event monitor in her right upper chest. She has easily palpable dorsalis pedis and posterior tibial pulses as well as popliteal pulses on the right. Integumentary (Hair, Skin) No erythema around the wound. Psychiatric appears at normal baseline. Notes Wound exam; there is a small punched out area on the left lateral malleolus. Nonviable surface. Using a #3 curette difficult debridement but I am able to get the surface to look better there is some bleeding Electronic Signature(s) Signed: 05/16/2019 5:34:04 PM By: [name] MD Entered By: [name] on 05/16/2019  14:55:46 -------------------------------------------------------------------------------- Physician Orders Details Patient Name: Tammie West. Date of Service: 05/16/2019 1:15 PM Medical Record [id number] Patient Account Number: [id number] Date of Birth/Sex: Treating RN: 1932-03-23 (86 y.o. [name] Primary Care Provider: [name] Other Clinician: Referring Provider: Treating Provider/Extender:Bibiana Gillean, Birdena Crandall, Jake Samples in Treatment: 0 Verbal / Phone Orders: No Diagnosis Coding Follow-up Appointments Return Appointment in 1 week. Dressing Change Frequency Wound #1 Left,Lateral Ankle Change dressing every day. Skin Barriers/Peri-Wound Care Wound #1 Left,Lateral Ankle Skin Prep Wound Cleansing Wound #1 Left,Lateral Ankle May shower and wash wound with soap and water. Primary Wound Dressing Wound #1 Left,Lateral Ankle Santyl Ointment - may use Iodosorb if Santyl not available Secondary Dressing Wound #1 Left,Lateral Ankle Foam Border - or large bandaid Off-Loading Other: - Avoid any pressure on left ankle Patient Medications Allergies: erythromycin base, penicillin, atorvastatin, Crestor, cefaclor, Macrobid, tolmetin Notifications Medication Indication Start End Santyl 05/16/2019 DOSE topical 250 unit/gram ointment - ointment topical to wound change daily Electronic Signature(s) Signed: 05/16/2019 2:56:51 PM By: [name] MD Entered By: [name] on 05/16/2019 14:56:50 -------------------------------------------------------------------------------- Problem List Details Patient Name: Tammie West. Date of Service: 05/16/2019 1:15 PM Medical Record [id number] Patient Account Number: [id number] Date of Birth/Sex: Treating RN: Apr 16, 1932 (86 y.o. [name] Primary Care Provider: [name] Other Clinician: Referring Provider: Treating Provider/Extender:Jennings Stirling,  Birdena Crandall, Jake Samples in Treatment: 0 Active Problems ICD-10 Evaluated Encounter Code Description Active Date Today Diagnosis L97.322 Non-pressure chronic ulcer of left ankle with fat layer 05/16/2019 No Yes exposed E11.51 Type 2 diabetes mellitus with diabetic peripheral 05/16/2019 No Yes angiopathy without gangrene Inactive Problems Resolved Problems Electronic Signature(s) Signed: 05/16/2019 5:34:04 PM By: [name] MD Entered By: [name] on 05/16/2019 14:51:06 -------------------------------------------------------------------------------- Progress Note Details Patient Name: Date of Service: Tammie West. 05/16/2019 1:15 PM Medical Record [id number] Patient Account Number: [id number] Date of Birth/Sex: Treating RN: 1932/10/22 (86 y.o. [name] Primary Care Provider: [name] Other Clinician: Referring Provider: Treating Provider/Extender:Haillee Johann, Birdena Crandall, Jake Samples in Treatment: 0 Subjective Chief Complaint Information obtained from Patient 05/16/2019; patient is here for review of a wound on the left lateral malleolus History of Present Illness (HPI) ADMISSION 05/16/2019 This is an 86 year old woman with type 2 diabetes. She developed a wound on her left lateral ankle last November. She was seen and followed by podiatry at [facility]. Nonhealing status and increasing pain.  She went on to have noninvasive arterial studies that showed a 75 to 99% distal SFA stenosis. She underwent an angiogram on 04/04/2019 and they were able to stent the left SFA. Marked improvement post revascularization. A recent x-ray of the ankle did not show osteomyelitis. A CT scan was ordered but apparently they been unable to get prior authorization through [employer] which is her insurance. They were using Mepilex. She received a course of doxycycline at the beginning of the month. Noted by Dr. [name] at his last  visit to have three-vessel runoff with an ABI of 1.29 on the right and 1.21 on the left. Past medical history; type 2 diabetes gout chronic pain PAD, CAD hypertension and chronic renal failure. Her ABIs were not repeated in our clinic Patient History Information obtained from Patient. Allergies atorvastatin (Severity: Moderate), cefaclor, Crestor (Severity: Moderate), erythromycin base (Severity: Severe, Reaction: hives swelling), Macrobid, penicillin (Severity: Severe, Reaction: hives swelling), tolmetin Family History Cancer - Mother, Lung Disease - Father, No family history of Diabetes, Heart Disease, Hereditary Spherocytosis, Hypertension, Kidney Disease, Seizures, Stroke, Thyroid Problems, Tuberculosis. Social History Current every day smoker, Marital Status - Married, Alcohol Use - Never, Drug Use - No History, Caffeine Use - Daily. Medical History Eyes Denies history of Cataracts, Glaucoma, Optic Neuritis Ear/Nose/Mouth/Throat Denies history of Chronic sinus problems/congestion, Middle ear problems Hematologic/Lymphatic Denies history of Anemia, Hemophilia, Human Immunodeficiency Virus, Lymphedema, Sickle Cell Disease Respiratory Denies history of Aspiration, Asthma, Chronic Obstructive Pulmonary Disease (COPD), Pneumothorax, Sleep Apnea, Tuberculosis Cardiovascular Patient has history of Congestive Heart Failure, Coronary Artery Disease, Hypertension, Peripheral Venous Disease Denies history of Angina, Arrhythmia, Deep Vein Thrombosis, Hypotension, Myocardial Infarction, Peripheral Arterial Disease, Phlebitis, Vasculitis Gastrointestinal Denies history of Cirrhosis , Colitis, Crohnoos, Hepatitis A, Hepatitis B, Hepatitis C Endocrine Patient has history of Type II Diabetes Denies history of Type I Diabetes Genitourinary Denies history of End Stage Renal Disease Immunological Denies history of Lupus Erythematosus, Raynaudoos, Scleroderma Integumentary (Skin) Denies  history of History of Burn Musculoskeletal Denies history of Gout, Rheumatoid Arthritis, Osteoarthritis, Osteomyelitis Neurologic Denies history of Dementia, Neuropathy, Quadriplegia, Paraplegia, Seizure Disorder Oncologic Denies history of Received Chemotherapy, Received Radiation Psychiatric Denies history of Anorexia/bulimia, Confinement Anxiety Patient is treated with Insulin. Review of Systems (ROS) Constitutional Symptoms (General Health) Denies complaints or symptoms of Fatigue, Fever, Chills, Marked Weight Change. Eyes Denies complaints or symptoms of Dry Eyes, Vision Changes, Glasses / Contacts. Ear/Nose/Mouth/Throat Denies complaints or symptoms of Chronic sinus problems or rhinitis. Respiratory Denies complaints or symptoms of Chronic or frequent coughs, Shortness of Breath. Cardiovascular Denies complaints or symptoms of Chest pain. Gastrointestinal Denies complaints or symptoms of Frequent diarrhea, Nausea, Vomiting. Endocrine Denies complaints or symptoms of Heat/cold intolerance. Genitourinary Denies complaints or symptoms of Frequent urination. Integumentary (Skin) Complains or has symptoms of Wounds. Musculoskeletal Denies complaints or symptoms of Muscle Pain, Muscle Weakness. Neurologic Denies complaints or symptoms of Numbness/parasthesias. Psychiatric Denies complaints or symptoms of Claustrophobia, Suicidal. Objective Constitutional Sitting or standing Blood Pressure is within target range for patient.. Pulse regular and within target range for patient.Marland Kitchen Respirations regular, non-labored and within target range.. Temperature is normal and within the target range for the patient.Marland Kitchen Appears in no distress. Vitals Time Taken: 1:40 PM, Height: 66 in, Source: Stated, Weight: 107 lbs, Source: Stated, BMI: 17.3, Temperature: 98.2 F, Pulse: 63 bpm, Respiratory Rate: 18 breaths/min, Blood Pressure: 120/84 mmHg. Respiratory work of breathing is normal.  Bilateral breath sounds are clear and equal in all lobes with no wheezes, rales or  rhonchi.. Cardiovascular Heart rhythm and rate regular, without murmur or gallop. Has an event monitor in her right upper chest. She has easily palpable dorsalis pedis and posterior tibial pulses as well as popliteal pulses on the right. Psychiatric appears at normal baseline. General Notes: Wound exam; there is a small punched out area on the left lateral malleolus. Nonviable surface. Using a #3 curette difficult debridement but I am able to get the surface to look better there is some bleeding Integumentary (Hair, Skin) No erythema around the wound. Wound #1 status is Open. Original cause of wound was Gradually Appeared. The wound is located on the Left,Lateral Ankle. The wound measures 0.5cm length x 0.5cm width x 0.2cm depth; 0.196cm^2 area and 0.039cm^3 volume. There is Fat Layer (Subcutaneous Tissue) Exposed exposed. There is no tunneling or undermining noted. There is a medium amount of serosanguineous drainage noted. The wound margin is flat and intact. There is no granulation within the wound bed. There is a large (67-100%) amount of necrotic tissue within the wound bed including Adherent Slough. Assessment Active Problems ICD-10 Non-pressure chronic ulcer of left ankle with fat layer exposed Type 2 diabetes mellitus with diabetic peripheral angiopathy without gangrene Procedures Wound #1 Pre-procedure diagnosis of Wound #1 is a Venous Leg Ulcer located on the Left,Lateral Ankle .Severity of Tissue Pre Debridement is: Fat layer exposed. There was a Excisional Skin/Subcutaneous Tissue Debridement with a total area of 0.25 sq cm performed by Ricard Dillon., MD. With the following instrument(s): Curette to remove Viable and Non-Viable tissue/material. Material removed includes Subcutaneous Tissue and Slough and after achieving pain control using Lidocaine 5% topical ointment. No specimens were  taken. A time out was conducted at 14:40, prior to the start of the procedure. A Minimum amount of bleeding was controlled with Pressure. The procedure was tolerated well with a pain level of 3 throughout and a pain level of 0 following the procedure. Post Debridement Measurements: 0.5cm length x 0.5cm width x 0.2cm depth; 0.039cm^3 volume. Character of Wound/Ulcer Post Debridement is improved. Severity of Tissue Post Debridement is: Fat layer exposed. Post procedure Diagnosis Wound #1: Same as Pre-Procedure Plan Follow-up Appointments: Return Appointment in 1 week. Dressing Change Frequency: Wound #1 Left,Lateral Ankle: Change dressing every day. Skin Barriers/Peri-Wound Care: Wound #1 Left,Lateral Ankle: Skin Prep Wound Cleansing: Wound #1 Left,Lateral Ankle: May shower and wash wound with soap and water. Primary Wound Dressing: Wound #1 Left,Lateral Ankle: Santyl Ointment - may use Iodosorb if Santyl not available Secondary Dressing: Wound #1 Left,Lateral Ankle: Foam Border - or large bandaid Off-Loading: Other: - Avoid any pressure on left ankle The following medication(s) was prescribed: Santyl topical 250 unit/gram ointment ointment topical to wound change daily starting 05/16/2019 1. The patient has been revascularized there is now good pulses in her feet and the popliteal should be enough blood flow to heal this 2. Reasonably aggressive debridement 3. Change the primary dressing to Santyl change daily. I have prescribed her a 30 g tube 4. When the surface of the wound becomes better endoform I spent 35 minutes in review of this patient's notes, face-to-face evaluation and preparation of this record Electronic Signature(s) Signed: 05/16/2019 5:34:04 PM By: [name] MD Entered By: [name] on 05/16/2019 14:58:05 -------------------------------------------------------------------------------- HxROS Details Patient Name: Date of Service: Tammie West. 05/16/2019 1:15 PM Medical Record [id number] Patient Account Number: [id number] Date of Birth/Sex: Treating RN: [birth year] (86 y.o. Orvan Falconer Primary Care Provider: [name] Other Clinician: Referring Provider: Treating Provider/Extender:Alam Guterrez,  Birdena Crandall, Rupashree Weeks in Treatment: 0 Information Obtained From Patient Constitutional Symptoms (General Health) Complaints and Symptoms: Negative for: Fatigue; Fever; Chills; Marked Weight Change Eyes Complaints and Symptoms: Negative for: Dry Eyes; Vision Changes; Glasses / Contacts Medical History: Negative for: Cataracts; Glaucoma; Optic Neuritis Ear/Nose/Mouth/Throat Complaints and Symptoms: Negative for: Chronic sinus problems or rhinitis Medical History: Negative for: Chronic sinus problems/congestion; Middle ear problems Respiratory Complaints and Symptoms: Negative for: Chronic or frequent coughs; Shortness of Breath Medical History: Negative for: Aspiration; Asthma; Chronic Obstructive Pulmonary Disease (COPD); Pneumothorax; Sleep Apnea; Tuberculosis Cardiovascular Complaints and Symptoms: Negative for: Chest pain Medical History: Positive for: Congestive Heart Failure; Coronary Artery Disease; Hypertension; Peripheral Venous Disease Negative for: Angina; Arrhythmia; Deep Vein Thrombosis; Hypotension; Myocardial Infarction; Peripheral Arterial Disease; Phlebitis; Vasculitis Gastrointestinal Complaints and Symptoms: Negative for: Frequent diarrhea; Nausea; Vomiting Medical History: Negative for: Cirrhosis ; Colitis; Crohns; Hepatitis A; Hepatitis B; Hepatitis C Endocrine Complaints and Symptoms: Negative for: Heat/cold intolerance Medical History: Positive for: Type II Diabetes Negative for: Type I Diabetes Time with diabetes: 49 years Treated with: Insulin Genitourinary Complaints and Symptoms: Negative for: Frequent urination Medical History: Negative for: End Stage  Renal Disease Integumentary (Skin) Complaints and Symptoms: Positive for: Wounds Medical History: Negative for: History of Burn Musculoskeletal Complaints and Symptoms: Negative for: Muscle Pain; Muscle Weakness Medical History: Negative for: Gout; Rheumatoid Arthritis; Osteoarthritis; Osteomyelitis Neurologic Complaints and Symptoms: Negative for: Numbness/parasthesias Medical History: Negative for: Dementia; Neuropathy; Quadriplegia; Paraplegia; Seizure Disorder Psychiatric Complaints and Symptoms: Negative for: Claustrophobia; Suicidal Medical History: Negative for: Anorexia/bulimia; Confinement Anxiety Hematologic/Lymphatic Medical History: Negative for: Anemia; Hemophilia; Human Immunodeficiency Virus; Lymphedema; Sickle Cell Disease Immunological Medical History: Negative for: Lupus Erythematosus; Raynauds; Scleroderma Oncologic Medical History: Negative for: Received Chemotherapy; Received Radiation Immunizations Pneumococcal Vaccine: Received Pneumococcal Vaccination: No Implantable Devices None Family and Social History Cancer: Yes - Mother; Diabetes: No; Heart Disease: No; Hereditary Spherocytosis: No; Hypertension: No; Kidney Disease: No; Lung Disease: Yes - Father; Seizures: No; Stroke: No; Thyroid Problems: No; Tuberculosis: No; Current every day smoker; Marital Status - Married; Alcohol Use: Never; Drug Use: No History; Caffeine Use: Daily; Financial Concerns: No; Food, Clothing or Shelter Needs: No; Support System Lacking: No; Transportation Concerns: No Electronic Signature(s) Signed: 05/16/2019 5:34:04 PM By: [name] MD Signed: 05/17/2019 5:39:56 PM By: Carlene Coria RN Entered By: Carlene Coria on 05/16/2019 13:46:16 -------------------------------------------------------------------------------- [place] Details Patient Name: Date of Service: Tammie West 05/16/2019 Medical Record [id number] Patient Account Number:  [id number] Date of Birth/Sex: Treating RN: 04-Aug-1932 (86 y.o. [name] Primary Care Provider: [name] Other Clinician: Referring Provider: Treating Provider/Extender:Derron Pipkins, Birdena Crandall, Jake Samples in Treatment: 0 Diagnosis Coding ICD-10 Codes Code Description [phone] Non-pressure chronic ulcer of left ankle with fat layer exposed E11.51 Type 2 diabetes mellitus with diabetic peripheral angiopathy without gangrene Facility Procedures CPT4 Code Description: [id number] 99213 - WOUND CARE VISIT-LEV 3 EST PT Modifier: 25 Quantity: 1 CPT4 Code Description: [id number] 11042 - DEB SUBQ TISSUE 20 SQ CM/< ICD-10 Diagnosis Description L97.322 Non-pressure chronic ulcer of left ankle with fat laye Modifier: r exposed Quantity: 1 Physician Procedures CPT4 Code Description: [id number] WC PHYS LEVEL 3 NEW PT ICD-10 Diagnosis Description L97.322 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

## 2019-05-18 ENCOUNTER — Other Ambulatory Visit: Payer: Self-pay | Admitting: Podiatry

## 2019-05-18 ENCOUNTER — Ambulatory Visit (HOSPITAL_COMMUNITY)
Admission: RE | Admit: 2019-05-18 | Discharge: 2019-05-18 | Disposition: A | Discharge status: Home / Self Care | Payer: Medicare HMO | Source: Ambulatory Visit | Attending: Cardiology | Admitting: Cardiology

## 2019-05-18 ENCOUNTER — Other Ambulatory Visit: Payer: Self-pay

## 2019-05-18 DIAGNOSIS — R0989 Other specified symptoms and signs involving the circulatory and respiratory systems: Secondary | ICD-10-CM

## 2019-05-18 DIAGNOSIS — R55 Syncope and collapse: Secondary | ICD-10-CM

## 2019-05-18 NOTE — Telephone Encounter (Signed)
Tammie West was able to get an approval yesterday. Thanks.

## 2019-05-18 NOTE — Telephone Encounter (Signed)
Left message informing pt CT had been approved and she could schedule with [facility]. Faxed orders and authorization to [place].

## 2019-05-19 ENCOUNTER — Telehealth: Payer: Self-pay

## 2019-05-19 ENCOUNTER — Encounter: Payer: Self-pay | Admitting: Cardiology

## 2019-05-19 DIAGNOSIS — I779 Disorder of arteries and arterioles, unspecified: Secondary | ICD-10-CM | POA: Insufficient documentation

## 2019-05-19 DIAGNOSIS — R9439 Abnormal result of other cardiovascular function study: Secondary | ICD-10-CM

## 2019-05-19 NOTE — Telephone Encounter (Signed)
Per Dr. Radford Pax, Dopplers showed 1-39% bilateral stenosis but abnormal flow in left vertebral and subclavian arteries - please refer to Dr. Fletcher Anon for further evaluation. Will put in referral for Dr. Fletcher Anon. Patient verbalized understanding.

## 2019-05-19 NOTE — Progress Notes (Signed)
Subjective: 86 year old female presents the office today requesting her nails to be trimmed as well as a corn on the right fifth toe.  Since I last saw her she has followed up with the wound care center for the wound on her left lateral ankle.  The wound care center is assumed care of this.  She does have a CT scan pending to rule osteomyelitis. Denies any systemic complaints such as fevers, chills, nausea, vomiting. No acute changes since last appointment, and no other complaints at this time.   Objective: AAO x3, NAD Neurovascular status unchanged. Nails are hypertrophic, dystrophic, brittle, discolored, elongated 10. No surrounding redness or drainage. Tenderness nails 1-5 bilaterally.  Hyperkeratotic lesion to the right fifth toe without any underlying ulceration or signs of infection.  Ulceration to the lateral malleolus with a granular wound base.  No surrounding erythema, ascending cellulitis.  There is no purulence.  No open lesions or pre-ulcerative lesions are identified today. No pain with calf compression, swelling, warmth, erythema  Assessment: Symptomatic onychomycosis, hyperkeratotic lesions with ulceration left lateral malleolus  Plan: -All treatment options discussed with the patient including all alternatives, risks, complications.  -Debrided nails x10 without any complications or bleeding also debrided the hyperkeratotic lesion x1 without any complications or bleeding.  Dispensed offloading pads for the right fifth toe.  Will defer care to the ulceration to the wound care center at this time.  She does have a CT scan pending to rule out osteomyelitis. -Patient encouraged to call the office with any questions, concerns, change in symptoms.   Trula Slade DPM

## 2019-05-19 NOTE — Telephone Encounter (Signed)
-----   Message from Sueanne Margarita, MD sent at 05/19/2019  9:22 AM EDT ----- Dopplers showed 1-39% bilateral stenosis but abnormal flow in left vertebral and subclavian arteries - please refer to Dr. Fletcher Anon for further evaulation

## 2019-05-23 ENCOUNTER — Encounter (HOSPITAL_BASED_OUTPATIENT_CLINIC_OR_DEPARTMENT_OTHER): Payer: Medicare HMO | Admitting: Internal Medicine

## 2019-05-23 ENCOUNTER — Other Ambulatory Visit: Payer: Self-pay

## 2019-05-23 DIAGNOSIS — L97322 Non-pressure chronic ulcer of left ankle with fat layer exposed: Secondary | ICD-10-CM | POA: Diagnosis not present

## 2019-05-23 NOTE — Progress Notes (Signed)
CLEMA, SKOUSEN ([id number]) Visit Report for 05/23/2019 Debridement Details Patient Name: Tammie West, Tammie West. Date of Service: 05/23/2019 2:45 PM Medical Record [id number] Patient Account Number: [id number] Date of Birth/Sex: Treating RN: 29-Jul-1932 (86 y.o. [name] Primary Care Provider: [name] Other Clinician: Referring Provider: Treating Provider/Extender:Violanda Bobeck, Birdena Crandall, Jake Samples in Treatment: 1 Debridement Performed for Wound #1 Left,Lateral Ankle Assessment: Performed By: Clinician Levan Hurst, RN Debridement Type: Chemical/Enzymatic/Mechanical Agent Used: Santyl Severity of Tissue Pre Fat layer exposed Debridement: Level of Consciousness (Pre- Awake and Alert procedure): Pre-procedure Verification/Time Out Taken: No Start Time: 15:20 Bleeding: None End Time: 15:20 Procedural Pain: 0 Post Procedural Pain: 0 Response to Treatment: Procedure was tolerated well Level of Consciousness Awake and Alert (Post-procedure): Post Debridement Measurements of Total Wound Length: (cm) 0.6 Width: (cm) 0.7 Depth: (cm) 0.1 Volume: (cm) 0.033 Character of Wound/Ulcer Post Requires Further Debridement Debridement: Severity of Tissue Post Debridement: Fat layer exposed Post Procedure Diagnosis Same as Pre-procedure Electronic Signature(s) Signed: 05/23/2019 5:33:33 PM By: Levan Hurst RN, BSN Signed: 05/23/2019 5:40:04 PM By: [name] MD Entered By: Levan Hurst on 05/23/2019 15:20:27 -------------------------------------------------------------------------------- HPI Details Patient Name: Date of Service: Tammie West 05/23/2019 2:45 PM Medical Record [id number] Patient Account Number: [id number] Date of Birth/Sex: Treating RN: 1932/12/24 (86 y.o. [name] Primary Care Provider: [name] Other Clinician: Referring Provider: Treating Provider/Extender:Corrin Hingle,  Birdena Crandall, Jake Samples in Treatment: 1 History of Present Illness HPI Description: ADMISSION 05/16/2019 This is an 86 year old woman with type 2 diabetes. She developed a wound on her left lateral ankle last November. She was seen and followed by podiatry at [facility]. Nonhealing status and increasing pain. She went on to have noninvasive arterial studies that showed a 75 to 99% distal SFA stenosis. She underwent an angiogram on 04/04/2019 and they were able to stent the left SFA. Marked improvement post revascularization. A recent x-ray of the ankle did not show osteomyelitis. A CT scan was ordered but apparently they been unable to get prior authorization through [employer] which is her insurance. They were using Mepilex. She received a course of doxycycline at the beginning of the month. Noted by Dr. [name] at his last visit to have three-vessel runoff with an ABI of 1.29 on the right and 1.21 on the left. Past medical history; type 2 diabetes gout chronic pain PAD, CAD hypertension and chronic renal failure. Her ABIs were not repeated in our clinic 05/23/2019; small area over the left lateral malleolus. She has had this wound since November. She was revascularized in early March. Plain x-rays did not show osteomyelitis. We started her on Santyl last week. She is applying this daily Apparently her primary doctor stopped her Lasix which was 40 mg but the patient was only taking half of this [20 mg] since then she has got left greater than right significant increase in edema.. She has support stockings at home Electronic Signature(s) Signed: 05/23/2019 5:40:04 PM By: [name] MD Entered By: [name] on 05/23/2019 15:43:45 -------------------------------------------------------------------------------- Physical Exam Details Patient Name: Date of Service: Tammie West 05/23/2019 2:45 PM Medical Record [id number] Patient Account Number:  [id number] Date of Birth/Sex: Treating RN: 1932/09/22 (86 y.o. [name] Primary Care Provider: [name] Other Clinician: Referring Provider: Treating Provider/Extender:Isayah Ignasiak, Birdena Crandall, Jake Samples in Treatment: 1 Constitutional Sitting or standing Blood Pressure is within target range for patient.. Pulse regular and within target range for patient.Marland Kitchen Respirations regular, non-labored and within target range.. Temperature is normal and  within the target range for the patient.Marland Kitchen Appears in no distress. Cardiovascular Pedal pulses are robust at both the dorsalis pedis and posterior tibial. Bilateral pitting edema of both legs below the knees no evidence of a DVT. Integumentary (Hair, Skin) No erythema around the wound. Psychiatric appears at normal baseline. Notes Wound exam; small punched out area on the left lateral malleolus. Pale looking wound nonviable surface. No debridement done today although I will try to do one next week. Electronic Signature(s) Signed: 05/23/2019 5:40:04 PM By: [name] MD Entered By: [name] on 05/23/2019 15:47:23 -------------------------------------------------------------------------------- Physician Orders Details Patient Name: Date of Service: Tammie West 05/23/2019 2:45 PM Medical Record [id number] Patient Account Number: [id number] Date of Birth/Sex: Treating RN: [birth year] (86 y.o. [name] Primary Care Provider: [name] Other Clinician: Referring Provider: Treating Provider/Extender:Azzan Butler, Birdena Crandall, Jake Samples in Treatment: 1 Verbal / Phone Orders: No Diagnosis Coding ICD-10 Coding Code Description [phone] Non-pressure chronic ulcer of left ankle with fat layer exposed E11.51 Type 2 diabetes mellitus with diabetic peripheral angiopathy without gangrene Follow-up Appointments Return Appointment in 1 week. Dressing Change  Frequency Wound #1 Left,Lateral Ankle Change dressing every day. Skin Barriers/Peri-Wound Care Wound #1 Left,Lateral Ankle Skin Prep Wound Cleansing Wound #1 Left,Lateral Ankle May shower and wash wound with soap and water. Primary Wound Dressing Wound #1 Left,Lateral Ankle Santyl Ointment Secondary Dressing Wound #1 Left,Lateral Ankle Foam Border - or large bandaid Edema Control Patient to wear own compression stockings - both legs daily Elevate legs to the level of the heart or above for 30 minutes daily and/or when sitting, a frequency of: - throughout the day Off-Loading Other: - Avoid any pressure on left ankle Electronic Signature(s) Signed: 05/23/2019 5:33:33 PM By: Levan Hurst RN, BSN Signed: 05/23/2019 5:40:04 PM By: [name] MD Entered By: Levan Hurst on 05/23/2019 15:24:35 -------------------------------------------------------------------------------- Problem List Details Patient Name: Date of Service: Tammie West 05/23/2019 2:45 PM Medical Record [id number] Patient Account Number: [id number] Date of Birth/Sex: Treating RN: Jun 10, 1932 (86 y.o. [name] Primary Care Provider: [name] Other Clinician: Referring Provider: Treating Provider/Extender:Tiffane Sheldon, Birdena Crandall, Jake Samples in Treatment: 1 Active Problems ICD-10 Encounter Code Description Active Date MDM Diagnosis L97.322 Non-pressure chronic ulcer of left ankle with fat layer 05/16/2019 No Yes exposed E11.51 Type 2 diabetes mellitus with diabetic peripheral 05/16/2019 No Yes angiopathy without gangrene Inactive Problems Resolved Problems Electronic Signature(s) Signed: 05/23/2019 5:40:04 PM By: [name] MD Entered By: [name] on 05/23/2019 15:40:27 -------------------------------------------------------------------------------- Progress Note Details Patient Name: Date of Service: Tammie West 05/23/2019 2:45  PM Medical Record [id number] Patient Account Number: [id number] Date of Birth/Sex: Treating RN: 1932/12/07 (86 y.o. [name] Primary Care Provider: [name] Other Clinician: Referring Provider: Treating Provider/Extender:Maybelle Depaoli, Birdena Crandall, Jake Samples in Treatment: 1 Subjective History of Present Illness (HPI) ADMISSION 05/16/2019 This is an 86 year old woman with type 2 diabetes. She developed a wound on her left lateral ankle last November. She was seen and followed by podiatry at [facility]. Nonhealing status and increasing pain. She went on to have noninvasive arterial studies that showed a 75 to 99% distal SFA stenosis. She underwent an angiogram on 04/04/2019 and they were able to stent the left SFA. Marked improvement post revascularization. A recent x-ray of the ankle did not show osteomyelitis. A CT scan was ordered but apparently they been unable to get prior authorization through [employer] which is her insurance. They were using Mepilex. She received a course of doxycycline  at the beginning of the month. Noted by Dr. [name] at his last visit to have three-vessel runoff with an ABI of 1.29 on the right and 1.21 on the left. Past medical history; type 2 diabetes gout chronic pain PAD, CAD hypertension and chronic renal failure. Her ABIs were not repeated in our clinic 05/23/2019; small area over the left lateral malleolus. She has had this wound since November. She was revascularized in early March. Plain x-rays did not show osteomyelitis. We started her on Santyl last week. She is applying this daily Apparently her primary doctor stopped her Lasix which was 40 mg but the patient was only taking half of this [20 mg] since then she has got left greater than right significant increase in edema.. She has support stockings at home Objective Constitutional Sitting or standing Blood Pressure is within target range for patient.. Pulse regular  and within target range for patient.Marland Kitchen Respirations regular, non-labored and within target range.. Temperature is normal and within the target range for the patient.Marland Kitchen Appears in no distress. Vitals Time Taken: 2:54 PM, Height: 66 in, Weight: 107 lbs, BMI: 17.3, Temperature: 98.1 F, Pulse: 66 bpm, Respiratory Rate: 18 breaths/min, Blood Pressure: 113/41 mmHg. Cardiovascular Pedal pulses are robust at both the dorsalis pedis and posterior tibial. Bilateral pitting edema of both legs below the knees no evidence of a DVT. Psychiatric appears at normal baseline. General Notes: Wound exam; small punched out area on the left lateral malleolus. Pale looking wound nonviable surface. No debridement done today although I will try to do one next week. Integumentary (Hair, Skin) No erythema around the wound. Wound #1 status is Open. Original cause of wound was Gradually Appeared. The wound is located on the Left,Lateral Ankle. The wound measures 0.6cm length x 0.7cm width x 0.1cm depth; 0.33cm^2 area and 0.033cm^3 volume. There is Fat Layer (Subcutaneous Tissue) Exposed exposed. There is no tunneling or undermining noted. There is a medium amount of serosanguineous drainage noted. The wound margin is flat and intact. There is small (1-33%) red, pink granulation within the wound bed. There is a large (67-100%) amount of necrotic tissue within the wound bed including Adherent Slough. Assessment Active Problems ICD-10 Non-pressure chronic ulcer of left ankle with fat layer exposed Type 2 diabetes mellitus with diabetic peripheral angiopathy without gangrene Procedures Wound #1 Pre-procedure diagnosis of Wound #1 is a Venous Leg Ulcer located on the Left,Lateral Ankle .Severity of Tissue Pre Debridement is: Fat layer exposed. There was a Chemical/Enzymatic/Mechanical debridement performed by Levan Hurst, RN.Marland Kitchen Agent used was [employer]. There was no bleeding. The procedure was tolerated well with a pain  level of 0 throughout and a pain level of 0 following the procedure. Post Debridement Measurements: 0.6cm length x 0.7cm width x 0.1cm depth; 0.033cm^3 volume. Character of Wound/Ulcer Post Debridement requires further debridement. Severity of Tissue Post Debridement is: Fat layer exposed. Post procedure Diagnosis Wound #1: Same as Pre-Procedure Plan Follow-up Appointments: Return Appointment in 1 week. Dressing Change Frequency: Wound #1 Left,Lateral Ankle: Change dressing every day. Skin Barriers/Peri-Wound Care: Wound #1 Left,Lateral Ankle: Skin Prep Wound Cleansing: Wound #1 Left,Lateral Ankle: May shower and wash wound with soap and water. Primary Wound Dressing: Wound #1 Left,Lateral Ankle: Santyl Ointment Secondary Dressing: Wound #1 Left,Lateral Ankle: Foam Border - or large bandaid Edema Control: Patient to wear own compression stockings - both legs daily Elevate legs to the level of the heart or above for 30 minutes daily and/or when sitting, a frequency of: - throughout the day  Off-Loading: Other: - Avoid any pressure on left ankle 1. I am continue with Santyl change daily 2. I told her she could use her support stockings on both legs 3. She has been revascularized in her pedal pulses on the left are really robust. 4. She will need an aggressive debridement of the wound next time 5. Her daughter asked me about a CT scan and I note that one was attempted at some point in the past however her insurance would not approve it. Right now I do not feel strongly about it but that may change. 6. I am not sure why her doctor stopped her Lasix. Daughter is going to call about it. She is developed quite a bit more pitting edema left greater than right Electronic Signature(s) Signed: 05/23/2019 5:40:04 PM By: [name] MD Entered By: [name] on 05/23/2019 15:48:53 -------------------------------------------------------------------------------- SuperBill  Details Patient Name: Date of Service: Tammie West 05/23/2019 Medical Record [id number] Patient Account Number: [id number] Date of Birth/Sex: Treating RN: 10-Aug-1932 (86 y.o. [name] Primary Care Provider: [name] Other Clinician: Referring Provider: Treating Provider/Extender:Riley Hallum, Birdena Crandall, Jake Samples in Treatment: 1 Diagnosis Coding ICD-10 Codes Code Description [phone] Non-pressure chronic ulcer of left ankle with fat layer exposed E11.51 Type 2 diabetes mellitus with diabetic peripheral angiopathy without gangrene Facility Procedures CPT4 Code: [id number] Description: [id number] - DEBRIDE W/O ANES NON SELECT Modifier: Quantity: 1 Physician Procedures CPT4 Code Description: [id number] - WC PHYS LEVEL 3 - EST PT ICD-10 Diagnosis Description Q91.694 Non-pressure chronic ulcer of left ankle with fat layer exp E11.51 Type 2 diabetes mellitus with diabetic peripheral angiopath Modifier: osed y without gan Quantity: 1 grene Electronic Signature(s) Signed: 05/23/2019 5:40:04 PM By: [name] MD Entered By: [name] on 05/23/2019 15:49:13

## 2019-05-26 ENCOUNTER — Other Ambulatory Visit: Payer: Self-pay

## 2019-05-26 ENCOUNTER — Ambulatory Visit (HOSPITAL_COMMUNITY): Payer: Medicare HMO | Attending: Internal Medicine

## 2019-05-26 DIAGNOSIS — R55 Syncope and collapse: Secondary | ICD-10-CM

## 2019-05-31 ENCOUNTER — Encounter: Payer: Self-pay | Admitting: Cardiovascular Disease

## 2019-05-31 ENCOUNTER — Ambulatory Visit: Payer: Medicare HMO | Admitting: Cardiovascular Disease

## 2019-05-31 ENCOUNTER — Telehealth: Payer: Self-pay | Admitting: Cardiology

## 2019-05-31 ENCOUNTER — Other Ambulatory Visit: Payer: Self-pay

## 2019-05-31 VITALS — BP 152/58 | HR 61 | Ht 62.0 in | Wt 110.0 lb

## 2019-05-31 DIAGNOSIS — E785 Hyperlipidemia, unspecified: Secondary | ICD-10-CM

## 2019-05-31 DIAGNOSIS — I771 Stricture of artery: Secondary | ICD-10-CM | POA: Diagnosis not present

## 2019-05-31 DIAGNOSIS — I739 Peripheral vascular disease, unspecified: Secondary | ICD-10-CM | POA: Diagnosis not present

## 2019-05-31 DIAGNOSIS — I10 Essential (primary) hypertension: Secondary | ICD-10-CM

## 2019-05-31 NOTE — Telephone Encounter (Signed)
Attempted to call the pt back to endorse to her, her echo results per Dr. Radford Pax and phone kept ringing, then immediately hung up.

## 2019-05-31 NOTE — Telephone Encounter (Signed)
Patient returning call for echo results. 

## 2019-05-31 NOTE — Progress Notes (Signed)
Cardiology Office Note   Date:  05/31/2019   ID:  Tammie West, DOB 05-01-1932, MRN [id number]  PCP:  [name], MD  Cardiologist: Dr. Radford Pax  No chief complaint on file.     History of Present Illness: Tammie West is a 86 y.o. female who was referred by Dr. Radford Pax for evaluation of abnormal carotid Doppler.  She has known history of peripheral arterial disease managed by Dr. [name].  She has a history of essential hypertension, diabetes mellitus, hyperlipidemia, tobacco use and nonobstructive coronary artery disease.  She had endovascular intervention on the left SFA in March.  She had carotid Doppler done in April which showed mild bilateral carotid disease.  However, she was noted to have left subclavian artery stenosis with abnormal flow in the left vertebral artery. She was having issues with dizziness recently that was always happening in the standing position but not necessarily when changing position.  It frequently happened while she was working in the kitchen using her arms.  She has no left arm claudication.  No chest pain or shortness of breath.    Past Medical History:  Diagnosis Date  . Bilateral carpal tunnel syndrome 02/17/2017  . Carotid artery disease (HCC)    1-39% bilateral stenosis  . Carpal tunnel syndrome   . CKD (chronic kidney disease) stage 4, GFR 15-29 ml/min (HCC)   . Coronary artery disease 11/2014    cath with Prox RCA to Mid RCA lesion, 45% stenosed and Ost LAD to Prox LAD lesion, 30% stenosed byt recent cath and high calcium score by coronary CTA.  . Crohn's disease ([place])   . Depression   . Diabetes mellitus without complication ([place])   . GERD (gastroesophageal reflux disease)   . Goiter   . Hyperlipidemia   . Hypertension   . Hypothyroidism   . Insomnia   . Migraine   . Mitral regurgitation    mild to moderate by echo 04/2017  . Osteoarthritis   . Osteoporosis   . PVD (peripheral vascular disease) ([place])    -s/p stenting of the 99% left mid SFA - followed by Dr. [name]  . Trochanteric bursitis     Past Surgical History:  Procedure Laterality Date  . ABDOMINAL HYSTERECTOMY    . APPENDECTOMY    . CARDIAC CATHETERIZATION N/A 12/13/2014   Procedure: Left Heart Cath and Coronary Angiography;  Surgeon: Belva Crome, MD;  Location: [facility];  Service: Cardiovascular;  Laterality: N/A;  . LOWER EXTREMITY ANGIOGRAPHY Left 04/04/2019   Procedure: LOWER EXTREMITY ANGIOGRAPHY;  Surgeon: Lorretta Harp, MD;  Location: [facility];  Service: Cardiovascular;  Laterality: Left;  . PERIPHERAL VASCULAR INTERVENTION Left 04/04/2019   Procedure: PERIPHERAL VASCULAR INTERVENTION;  Surgeon: Lorretta Harp, MD;  Location: [facility];  Service: Cardiovascular;  Laterality: Left;     Current Outpatient Medications  Medication Sig Dispense Refill  . Accu-Chek FastClix Lancets MISC USE 1 LANCET TO CHECK BLOOD SUGAR 3 TIMES DAILY FOR TYPE 2 DIABETES MELLITUS ON INSULIN    . ACCU-CHEK SMARTVIEW test strip USE TO CHECK BLOOD SUGAR THREE TIMES DAILY    . allopurinol (ZYLOPRIM) 100 MG tablet Take 100 mg by mouth daily.    Marland Kitchen amitriptyline (ELAVIL) 25 MG tablet Take 25 mg by mouth at bedtime.    Marland Kitchen amLODipine (NORVASC) 5 MG tablet Take 5 mg by mouth daily.    Marland Kitchen aspirin EC 81 MG tablet Take 1 tablet (81 mg  total) by mouth daily.    . BD PEN NEEDLE NANO U/F 32G X 4 MM MISC     . clidinium-chlordiazePOXIDE (LIBRAX) 5-2.5 MG capsule Take 1 capsule by mouth 2 (two) times daily.    . clopidogrel (PLAVIX) 75 MG tablet Take 1 tablet (75 mg total) by mouth daily. 30 tablet 1  . doxycycline (VIBRA-TABS) 100 MG tablet Take 1 tablet (100 mg total) by mouth 2 (two) times daily. 20 tablet 0  . escitalopram (LEXAPRO) 20 MG tablet Take 20 mg by mouth daily.    [name] 75 MCG tablet Take 75 mcg by mouth See admin instructions. Take 75 mg Mon-wed-Fri- Sun Take 37.5 mg on Tues. Thurs. Saturday    . insulin  glargine (LANTUS) 100 unit/mL SOPN Inject 5 Units into the skin daily after breakfast.     . lidocaine (XYLOCAINE) 5 % ointment Apply 1 application topically 4 (four) times daily as needed for mild pain.     . mesalamine (LIALDA) 1.2 G EC tablet Take 2.4 g by mouth daily after breakfast.    . MORPHABOND ER 15 MG T12A Take 15 mg by mouth in the morning and at bedtime.     . nitroGLYCERIN (NITROSTAT) 0.4 MG SL tablet Place 1 tablet (0.4 mg total) under the tongue every 5 (five) minutes as needed for chest pain. 25 tablet 3  . Oxycodone HCl 10 MG TABS Take 10 mg by mouth every 4 (four) hours as needed (Pain).     . polyethylene glycol (MIRALAX / GLYCOLAX) packet Take 17 g by mouth daily as needed for mild constipation.    . Probiotic Product (ALIGN) 4 MG CAPS Take 1 capsule by mouth daily after breakfast.     . SANTYL ointment APPLY OINTMENT TOPICALLY TO WOUND CHANGE DAILY    . simvastatin (ZOCOR) 10 MG tablet Take 10 mg by mouth at bedtime.      No current facility-administered medications for this visit.    Allergies:   Atorvastatin, Byetta 10 mcg pen [exenatide], Cefaclor, Codeine, Crestor [rosuvastatin], Dexilant [dexlansoprazole], Erythromycin, Fosamax [alendronate sodium], Macrobid [nitrofurantoin monohyd macro], Metformin and related, Nsaids, Penicillins, Tolmetin, and Shellfish allergy    Social History:  The patient  reports that she has been smoking cigarettes. She has been smoking about 0.25 packs per day. She has never used smokeless tobacco. She reports that she does not drink alcohol or use drugs.   Family History:  The patient's family history includes CAD in her brother; Emphysema in her father; Heart disease in her brother.    ROS:  Please see the history of present illness.   Otherwise, review of systems are positive for none.   All other systems are reviewed and negative.    PHYSICAL EXAM: VS:  BP (!) 152/58   Pulse 61   Ht 5\' 2"  (1.575 m)   Wt 110 lb (49.9 kg)   SpO2  98%   BMI 20.12 kg/m  , BMI Body mass index is 20.12 kg/m. GEN: Well nourished, well developed, in no acute distress  HEENT: normal  Neck: no JVD or masses.  Bilateral carotid bruits louder on the left side.  There is also a bruit at the left subclavian area Cardiac: RRR; no rubs, or gallops,no edema .  1 out of 6 systolic murmur in the aortic area Respiratory:  clear to auscultation bilaterally, normal work of breathing GI: soft, nontender, nondistended, + BS MS: no deformity or atrophy  Skin: warm and dry, no rash Neuro:  Strength and sensation are intact Psych: euthymic mood, full affect Vascular: Radial pulses +2 on the right and slightly diminished on the left.   EKG:  EKG is not ordered today.    Recent Labs: 01/07/2019: ALT 10 03/31/2019: BUN 16; Creatinine, Ser 1.00; Hemoglobin 11.5; Platelets 393; Potassium 5.0; Sodium 142    Lipid Panel    Component Value Date/Time   CHOL 88 (L) 07/21/2018 1307   TRIG 68 07/21/2018 1307   HDL 51 07/21/2018 1307   CHOLHDL 1.7 07/21/2018 1307   CHOLHDL 2.0 01/08/2016 0941   VLDL 18 01/08/2016 0941   LDLCALC 23 07/21/2018 1307      Wt Readings from Last 3 Encounters:  05/31/19 110 lb (49.9 kg)  05/03/19 109 lb (49.4 kg)  04/27/19 107 lb 3.2 oz (48.6 kg)      \  No flowsheet data found.    ASSESSMENT AND PLAN:  1.  Left subclavian artery stenosis: The patient denies left arm claudication.  She does have slightly diminished pulses in the left arm compared to the right.  It is possible that some of her recent episodes of dizziness might have been related to some subclavian steal especially that some of the episodes happened while she was using her arms in the kitchen.  However, she reports resolution of symptoms since she was placed on Plavix.  Thus, currently there is no indication for revascularization.  I asked her to monitor her symptoms. Blood pressure check should be always in the right arm not the left arm. She is  already established with Dr. [name] and can continue follow-up with him regarding this and peripheral arterial disease.  2.  Peripheral arterial disease with recent SFA revascularization for critical limb ischemia: She continues to follow-up with the wound clinic with gradual improvement.  3.  Essential hypertension: Blood pressure is mildly elevated.  4.  Hyperlipidemia: Currently on simvastatin.    Disposition:   Continue regular follow-up with Dr. Radford Pax and Dr. [name] Signed,  Kathlyn Sacramento, MD  05/31/2019 9:30 AM    [place]

## 2019-05-31 NOTE — Patient Instructions (Signed)
Medication Instructions:  No changes *If you need a refill on your cardiac medications before your next appointment, please call your pharmacy*   Lab Work: None ordered If you have labs (blood work) drawn today and your tests are completely normal, you will receive your results only by: Marland Kitchen MyChart Message (if you have MyChart) OR . A paper copy in the mail If you have any lab test that is abnormal or we need to change your treatment, we will call you to review the results.   Testing/Procedures: None ordered   Follow-Up: At [facility], you and your health needs are our priority.  As part of our continuing mission to provide you with exceptional heart care, we have created designated Provider Care Teams.  These Care Teams include your primary Cardiologist (physician) and Advanced Practice Providers (APPs -  Physician Assistants and Nurse Practitioners) who all work together to provide you with the care you need, when you need it.  We recommend signing up for the patient portal called "MyChart".  Sign up information is provided on this After Visit Summary.  MyChart is used to connect with patients for Virtual Visits (Telemedicine).  Patients are able to view lab/test results, encounter notes, upcoming appointments, etc.  Non-urgent messages can be sent to your provider as well.   To learn more about what you can do with MyChart, go to [email or web address].    Your next appointment:   Keep follow up appointment with Dr. [name] as planned   Other Instructions Take your blood pressure in the right arm and not the left.

## 2019-06-01 NOTE — Telephone Encounter (Signed)
Follow up  Pt returning call regarding echo results

## 2019-06-01 NOTE — Telephone Encounter (Signed)
Left message to call office

## 2019-06-01 NOTE — Telephone Encounter (Signed)
The patient has been notified of the result and verbalized understanding.  All questions (if any) were answered. Antonieta Iba, RN 06/01/2019 10:33 AM

## 2019-06-02 ENCOUNTER — Encounter (HOSPITAL_BASED_OUTPATIENT_CLINIC_OR_DEPARTMENT_OTHER): Payer: Medicare HMO | Attending: Internal Medicine | Admitting: Internal Medicine

## 2019-06-02 DIAGNOSIS — L97322 Non-pressure chronic ulcer of left ankle with fat layer exposed: Secondary | ICD-10-CM | POA: Diagnosis not present

## 2019-06-02 DIAGNOSIS — N189 Chronic kidney disease, unspecified: Secondary | ICD-10-CM | POA: Insufficient documentation

## 2019-06-02 DIAGNOSIS — I129 Hypertensive chronic kidney disease with stage 1 through stage 4 chronic kidney disease, or unspecified chronic kidney disease: Secondary | ICD-10-CM | POA: Diagnosis not present

## 2019-06-02 DIAGNOSIS — E1122 Type 2 diabetes mellitus with diabetic chronic kidney disease: Secondary | ICD-10-CM | POA: Diagnosis not present

## 2019-06-02 DIAGNOSIS — E11621 Type 2 diabetes mellitus with foot ulcer: Secondary | ICD-10-CM | POA: Diagnosis not present

## 2019-06-02 DIAGNOSIS — E1151 Type 2 diabetes mellitus with diabetic peripheral angiopathy without gangrene: Secondary | ICD-10-CM | POA: Diagnosis not present

## 2019-06-03 ENCOUNTER — Other Ambulatory Visit: Payer: Self-pay | Admitting: Cardiology

## 2019-06-08 ENCOUNTER — Encounter: Payer: Self-pay | Admitting: Podiatry

## 2019-06-08 ENCOUNTER — Telehealth: Payer: Self-pay | Admitting: *Deleted

## 2019-06-08 ENCOUNTER — Other Ambulatory Visit: Payer: Self-pay | Admitting: *Deleted

## 2019-06-08 DIAGNOSIS — M869 Osteomyelitis, unspecified: Secondary | ICD-10-CM

## 2019-06-08 NOTE — Telephone Encounter (Signed)
Called and spoke with Tiara at [facility] imaging and gave them the authorization number and the valid dates and I also put in a new order for the MRI w/o contrast left ankle and Tiara is going to follow up on this and get the patient in and I stated to call me back if I need to do anything else. Lattie Haw

## 2019-06-08 NOTE — Telephone Encounter (Signed)
Called and left a message for the daughter. Lattie Haw

## 2019-06-08 NOTE — Progress Notes (Signed)
KYAH, BUESING ([id number]) Visit Report for 05/23/2019 Arrival Information Details Patient Name: Date of Service: Tammie West. 05/23/2019 2:45 PM Medical Record Number: [id number] Patient Account Number: [id number] Date of Birth/Sex: Treating RN: 1932-10-23 (85 y.o. Tammie West Primary Care Elster Corbello: [name] Other Clinician: Referring Tkeya Stencil: Treating Decorey Wahlert/Extender: Gabriel Rainwater in Treatment: 1 Visit Information History Since Last Visit Added or deleted any medications: No Patient Arrived: Cane Any new allergies or adverse reactions: No Arrival Time: 14:51 Had a fall or experienced change in No Accompanied By: daughter activities of daily living that may affect Transfer Assistance: None risk of falls: Patient Identification Verified: Yes Signs or symptoms of abuse/neglect since last visito No Secondary Verification Process Completed: Yes Hospitalized since last visit: No Patient Has Alerts: Yes Implantable device outside of the clinic excluding No Patient Alerts: Patient on Blood Thinner cellular tissue based products placed in the center L ABI: 1.21 TBI: 0.94 since last visit: 03/2019 Has Dressing in Place as Prescribed: Yes Pain Present Now: Yes Electronic Signature(s) Signed: 06/08/2019 9:09:16 AM By: Sandre Kitty Entered By: Sandre Kitty on 05/23/2019 14:52:00 -------------------------------------------------------------------------------- Encounter Discharge Information Details Patient Name: Date of Service: Tammie Lowes M. 05/23/2019 2:45 PM Medical Record Number: [id number] Patient Account Number: [id number] Date of Birth/Sex: Treating RN: 11/23/1932 (86 y.o. Tammie West Primary Care Willow Reczek: [name] Other Clinician: Referring Amyjo Mizrachi: Treating Niang Mitcheltree/Extender: Gabriel Rainwater in Treatment: 1 Encounter Discharge  Information Items Post Procedure Vitals Discharge Condition: Stable Temperature (F): 98.1 Ambulatory Status: Cane Pulse (bpm): 66 Discharge Destination: Home Respiratory Rate (breaths/min): 18 Transportation: Private Auto Blood Pressure (mmHg): 113/41 Accompanied By: daughter Schedule Follow-up Appointment: Yes Clinical Summary of Care: Electronic Signature(s) Signed: 05/23/2019 5:14:33 PM By: Deon Pilling Entered By: Deon Pilling on 05/23/2019 15:30:52 -------------------------------------------------------------------------------- Lower Extremity Assessment Details Patient Name: Date of Service: Tammie West. 05/23/2019 2:45 PM Medical Record Number: [id number] Patient Account Number: [id number] Date of Birth/Sex: Treating RN: Jul 09, 1932 (86 y.o. Tammie West Primary Care Merci Walthers: [name] Other Clinician: Referring Deserea Bordley: Treating Aleyna Cueva/Extender: Gabriel Rainwater in Treatment: 1 Edema Assessment Assessed: [Left: Yes] [Right: No] Edema: [Left: Ye] [Right: s] Calf Left: Right: Point of Measurement: 37 cm From Medial Instep 35 cm cm Ankle Left: Right: Point of Measurement: 10 cm From Medial Instep 21 cm cm Electronic Signature(s) Signed: 05/23/2019 5:14:33 PM By: Deon Pilling Signed: 05/23/2019 5:33:33 PM By: Levan Hurst RN, BSN Entered By: Deon Pilling on 05/23/2019 15:02:21 -------------------------------------------------------------------------------- Multi Wound Chart Details Patient Name: Date of Service: Tammie Lowes M. 05/23/2019 2:45 PM Medical Record Number: [id number] Patient Account Number: [id number] Date of Birth/Sex: Treating RN: 1933-01-18 (86 y.o. Tammie West Primary Care Kawanda Drumheller: [name] Other Clinician: Referring Charlyne Robertshaw: Treating Calynn Ferrero/Extender: Gabriel Rainwater in Treatment: 1 Vital Signs Height(in):  66 Pulse(bpm): 66 Weight(lbs): 107 Blood Pressure(mmHg): 113/41 Body Mass Index(BMI): 17 Temperature(F): 98.1 Respiratory Rate(breaths/min): 18 Photos: [1:No Photos Left, Lateral Ankle] [N/A:N/A N/A] Wound Location: [1:Gradually Appeared] [N/A:N/A] Wounding Event: [1:Venous Leg Ulcer] [N/A:N/A] Primary Etiology: [1:Congestive Heart Failure, Coronary] [N/A:N/A] Comorbid History: [1:Artery Disease, Hypertension, Peripheral Venous Disease, Type II Diabetes 12/03/2018] [N/A:N/A] Date Acquired: [1:1] [N/A:N/A] Weeks of Treatment: [1:Open] [N/A:N/A] Wound Status: [1:0.6x0.7x0.1] [N/A:N/A] Measurements L x W x D (cm) [1:0.33] [N/A:N/A] A (cm) : rea [1:0.033] [N/A:N/A] Volume (cm) : [1:-68.40%] [N/A:N/A] % Reduction in Area: [1:15.40%] [N/A:N/A] % Reduction in Volume: [1:Full Thickness Without Exposed] [N/A:N/A] Classification: [1:Support  Structures Medium] [N/A:N/A] Exudate A mount: [1:Serosanguineous] [N/A:N/A] Exudate Type: [1:red, brown] [N/A:N/A] Exudate Color: [1:Flat and Intact] [N/A:N/A] Wound Margin: [1:Small (1-33%)] [N/A:N/A] Granulation A mount: [1:Red, Pink] [N/A:N/A] Granulation Quality: [1:Large (67-100%)] [N/A:N/A] Necrotic A mount: [1:Fat Layer (Subcutaneous Tissue)] [N/A:N/A] Exposed Structures: [1:Exposed: Yes Fascia: No Tendon: No Muscle: No Joint: No Bone: No None] [N/A:N/A] Epithelialization: [1:Chemical/Enzymatic/Mechanical] [N/A:N/A] Debridement: [1:N/A] [N/A:N/A] Instrument: [1:None] [N/A:N/A] Bleeding: [1:0] [N/A:N/A] Procedural Pain: [1:0] [N/A:N/A] Post Procedural Pain: Debridement Treatment Response: Procedure was tolerated well [N/A:N/A] Post Debridement Measurements L x 0.6x0.7x0.1 [N/A:N/A] W x D (cm) [1:0.033] [N/A:N/A] Post Debridement Volume: (cm) [1:Debridement] [N/A:N/A] Treatment Notes Wound #1 (Left, Lateral Ankle) 1. Cleanse With Wound Cleanser 2. Periwound Care Skin Prep 3. Primary Dressing Applied Santyl 4. Secondary  Dressing Foam Border Dressing 5. Secured With Office manager) Signed: 05/23/2019 5:33:33 PM By: Levan Hurst RN, BSN Signed: 05/23/2019 5:40:04 PM By: [name] MD Entered By: [name] on 05/23/2019 15:40:34 -------------------------------------------------------------------------------- Multi-Disciplinary Care Plan Details Patient Name: Date of Service: Tammie Lowes M. 05/23/2019 2:45 PM Medical Record Number: [id number] Patient Account Number: [id number] Date of Birth/Sex: Treating RN: 04/08/1932 (86 y.o. Tammie West Primary Care Ellee Wawrzyniak: [name] Other Clinician: Referring Carmalita Wakefield: Treating Daion Ginsberg/Extender: Gabriel Rainwater in Treatment: 1 Active Inactive Abuse / Safety / Falls / Self Care Management Nursing Diagnoses: Potential for falls Potential for injury related to falls Goals: Patient will remain injury free related to falls Date Initiated: 05/16/2019 Target Resolution Date: 06/17/2019 Goal Status: Active Patient/caregiver will verbalize/demonstrate measures taken to prevent injury and/or falls Date Initiated: 05/16/2019 Target Resolution Date: 06/17/2019 Goal Status: Active Interventions: Assess Activities of Daily Living upon admission and as needed Assess fall risk on admission and as needed Assess: immobility, friction, shearing, incontinence upon admission and as needed Assess impairment of mobility on admission and as needed per policy Assess personal safety and home safety (as indicated) on admission and as needed Assess self care needs on admission and as needed Provide education on fall prevention Notes: Wound/Skin Impairment Nursing Diagnoses: Impaired tissue integrity Knowledge deficit related to ulceration/compromised skin integrity Goals: Patient/caregiver will verbalize understanding of skin care regimen Date Initiated: 05/16/2019 Target  Resolution Date: 06/17/2019 Goal Status: Active Ulcer/skin breakdown will have a volume reduction of 30% by week 4 Date Initiated: 05/16/2019 Target Resolution Date: 06/17/2019 Goal Status: Active Interventions: Assess patient/caregiver ability to obtain necessary supplies Assess patient/caregiver ability to perform ulcer/skin care regimen upon admission and as needed Assess ulceration(s) every visit Provide education on ulcer and skin care Notes: Electronic Signature(s) Signed: 05/23/2019 5:33:33 PM By: Levan Hurst RN, BSN Entered By: Levan Hurst on 05/23/2019 15:10:21 -------------------------------------------------------------------------------- Pain Assessment Details Patient Name: Date of Service: Tammie Lowes M. 05/23/2019 2:45 PM Medical Record Number: [id number] Patient Account Number: [id number] Date of Birth/Sex: Treating RN: 1933-01-15 (86 y.o. Tammie West Primary Care Michaeljohn Biss: [name] Other Clinician: Referring Cordaryl Decelles: Treating Brookelyn Gaynor/Extender: Gabriel Rainwater in Treatment: 1 Active Problems Location of Pain Severity and Description of Pain Patient Has Paino Yes Site Locations Pain Management and Medication Current Pain Management: Electronic Signature(s) Signed: 05/23/2019 5:33:33 PM By: Levan Hurst RN, BSN Signed: 06/08/2019 9:09:16 AM By: Sandre Kitty Entered By: Sandre Kitty on 05/23/2019 14:54:38 -------------------------------------------------------------------------------- Patient/Caregiver Education Details Patient Name: Date of Service: Tammie West 4/26/2021andnbsp2:45 PM Medical Record Number: [id number] Patient Account Number: [id number] Date of Birth/Gender: Treating RN: [birth year] (86 y.o. Tammie West Primary Care Physician: Fara Olden,  Rupashree Other Clinician: Referring Physician: Treating Physician/Extender: Gabriel Rainwater in Treatment: 1 Education Assessment Education Provided To: Patient Education Topics Provided Wound/Skin Impairment: Methods: Explain/Verbal Responses: State content correctly Electronic Signature(s) Signed: 05/23/2019 5:33:33 PM By: Levan Hurst RN, BSN Entered By: Levan Hurst on 05/23/2019 15:10:34 -------------------------------------------------------------------------------- Wound Assessment Details Patient Name: Date of Service: Tammie Lowes M. 05/23/2019 2:45 PM Medical Record Number: [id number] Patient Account Number: [id number] Date of Birth/Sex: Treating RN: 1932/04/28 (86 y.o. Tammie West Primary Care Kyre Jeffries: [name] Other Clinician: Referring Helon Wisinski: Treating Kamari Buch/Extender: Gabriel Rainwater in Treatment: 1 Wound Status Wound Number: 1 Primary Venous Leg Ulcer Etiology: Wound Location: Left, Lateral Ankle Wound Open Wounding Event: Gradually Appeared Status: Date Acquired: 12/03/2018 Comorbid Congestive Heart Failure, Coronary Artery Disease, Hypertension, Weeks Of Treatment: 1 History: Peripheral Venous Disease, Type II Diabetes Clustered Wound: No Photos Photo Uploaded By: Mikeal Hawthorne on 05/24/2019 14:54:27 Wound Measurements Length: (cm) 0.6 Width: (cm) 0.7 Depth: (cm) 0.1 Area: (cm) 0.33 Volume: (cm) 0.033 % Reduction in Area: -68.4% % Reduction in Volume: 15.4% Epithelialization: None Tunneling: No Undermining: No Wound Description Classification: Full Thickness Without Exposed Support Structures Wound Margin: Flat and Intact Exudate Amount: Medium Exudate Type: Serosanguineous Exudate Color: red, brown Foul Odor After Cleansing: No Slough/Fibrino Yes Wound Bed Granulation Amount: Small (1-33%) Exposed Structure Granulation Quality: Red, Pink Fascia Exposed: No Necrotic Amount: Large (67-100%) Fat Layer (Subcutaneous Tissue) Exposed: Yes Necrotic  Quality: Adherent Slough Tendon Exposed: No Muscle Exposed: No Joint Exposed: No Bone Exposed: No Electronic Signature(s) Signed: 05/23/2019 5:14:33 PM By: Deon Pilling Signed: 05/23/2019 5:33:33 PM By: Levan Hurst RN, BSN Entered By: Deon Pilling on 05/23/2019 15:02:36 -------------------------------------------------------------------------------- Vitals Details Patient Name: Date of Service: Tammie Lowes M. 05/23/2019 2:45 PM Medical Record Number: [id number] Patient Account Number: [id number] Date of Birth/Sex: Treating RN: [birth year] (86 y.o. Tammie West Primary Care Martita Brumm: [name] Other Clinician: Referring Argelio Granier: Treating Joel Mericle/Extender: Gabriel Rainwater in Treatment: 1 Vital Signs Time Taken: 14:54 Temperature (F): 98.1 Height (in): 66 Pulse (bpm): 66 Weight (lbs): 107 Respiratory Rate (breaths/min): 18 Body Mass Index (BMI): 17.3 Blood Pressure (mmHg): 113/41 Reference Range: 80 - 120 mg / dl Electronic Signature(s) Signed: 06/08/2019 9:09:16 AM By: Sandre Kitty Entered By: Sandre Kitty on 05/23/2019 14:54:27

## 2019-06-09 ENCOUNTER — Encounter (HOSPITAL_BASED_OUTPATIENT_CLINIC_OR_DEPARTMENT_OTHER): Payer: Medicare HMO | Admitting: Internal Medicine

## 2019-06-10 ENCOUNTER — Ambulatory Visit
Admission: RE | Admit: 2019-06-10 | Discharge: 2019-06-10 | Disposition: A | Discharge status: Home / Self Care | Payer: Medicare HMO | Source: Ambulatory Visit | Attending: Internal Medicine | Admitting: Internal Medicine

## 2019-06-10 ENCOUNTER — Other Ambulatory Visit: Payer: Self-pay | Admitting: Internal Medicine

## 2019-06-10 DIAGNOSIS — M533 Sacrococcygeal disorders, not elsewhere classified: Secondary | ICD-10-CM

## 2019-06-13 ENCOUNTER — Other Ambulatory Visit: Payer: Self-pay

## 2019-06-13 ENCOUNTER — Ambulatory Visit: Payer: Medicare HMO | Admitting: Podiatry

## 2019-06-13 ENCOUNTER — Encounter (HOSPITAL_BASED_OUTPATIENT_CLINIC_OR_DEPARTMENT_OTHER): Payer: Medicare HMO | Admitting: Internal Medicine

## 2019-06-13 DIAGNOSIS — E11621 Type 2 diabetes mellitus with foot ulcer: Secondary | ICD-10-CM | POA: Diagnosis not present

## 2019-06-13 NOTE — Progress Notes (Signed)
Tammie West, Tammie West ([id number]) Visit Report for 06/13/2019 Debridement Details Patient Name: Date of Service: Tammie West. 06/13/2019 2:15 PM Medical Record Number: [id number] Patient Account Number: [id number] Date of Birth/Sex: Treating RN: 1932/08/05 (86 y.o. F) Primary Care Provider: [name] Other Clinician: Referring Provider: Treating Provider/Extender: Tammie West in Treatment: 4 Debridement Performed for Assessment: Wound #1 Left,Lateral Ankle Performed By: Clinician , Debridement Type: Chemical/Enzymatic/Mechanical Agent Used: Santyl Severity of Tissue Pre Debridement: Fat layer exposed Level of Consciousness (Pre-procedure): Awake and Alert Pre-procedure Verification/Time Out Yes - 15:45 Taken: Start Time: 15:45 Bleeding: None End Time: 15:45 Procedural Pain: 0 Post Procedural Pain: 0 Response to Treatment: Procedure was tolerated well Level of Consciousness (Post- Awake and Alert procedure): Post Debridement Measurements of Total Wound Length: (cm) 0.6 Width: (cm) 0.6 Depth: (cm) 0.1 Volume: (cm) 0.028 Character of Wound/Ulcer Post Debridement: Requires Further Debridement Severity of Tissue Post Debridement: Fat layer exposed Post Procedure Diagnosis Same as Pre-procedure Electronic Signature(s) Signed: 06/13/2019 4:51:55 PM By: [name] MD Entered By: [name] on 06/13/2019 16:08:02 -------------------------------------------------------------------------------- HPI Details Patient Name: Date of Service: Tammie West M. 06/13/2019 2:15 PM Medical Record Number: [id number] Patient Account Number: [id number] Date of Birth/Sex: Treating RN: 11-15-1932 (86 y.o. F) Primary Care Provider: [name] Other Clinician: Referring Provider: Treating Provider/Extender: Tammie West in Treatment: 4 History of Present Illness HPI  Description: ADMISSION 05/16/2019 This is an 86 year old woman with type 2 diabetes. She developed a wound on her left lateral ankle last November. She was seen and followed by podiatry at [facility]. Nonhealing status and increasing pain. She went on to have noninvasive arterial studies that showed a 75 to 99% distal SFA stenosis. She underwent an angiogram on 04/04/2019 and they were able to stent the left SFA. Marked improvement post revascularization. A recent x-ray of the ankle did not show osteomyelitis. A CT scan was ordered but apparently they been unable to get prior authorization through [employer] which is her insurance. They were using Mepilex. She received a course of doxycycline at the beginning of the month. Noted by Dr. [name] at his last visit to have three-vessel runoff with an ABI of 1.29 on the right and 1.21 on the left. Past medical history; type 2 diabetes gout chronic pain PAD, CAD hypertension and chronic renal failure. Her ABIs were not repeated in our clinic 05/23/2019; small area over the left lateral malleolus. She has had this wound since November. She was revascularized in early March. Plain x-rays did not show osteomyelitis. We started her on Santyl last week. She is applying this daily Apparently her primary doctor stopped her Lasix which was 40 mg but the patient was only taking half of this [20 mg] since then she has got left greater than right significant increase in edema.. She has support stockings at home 5/6; small area over the left lateral malleolus. We have been using Santyl. The wound bed looks better although somewhat pale. She has several other complaints including a small skin discoloration over the right medial malleolus some plantar foot pain. She does have type 2 diabetes. 5/17; small puncture area over the left lateral malleolus with been using Santyl. The wound bed looks better. She is not felt to have an arterial issue previously reviewed by Dr.  [name] Electronic Signature(s) Signed: 06/13/2019 4:51:55 PM By: [name] MD Entered By: [name] on 06/13/2019 16:09:33 -------------------------------------------------------------------------------- Physical Exam Details Patient Name: Date of Service:  Tammie Tammie Benes M. 06/13/2019 2:15 PM Medical Record Number: [id number] Patient Account Number: [id number] Date of Birth/Sex: Treating RN: 04/10/1932 (86 y.o. F) Primary Care Provider: [name] Other Clinician: Referring Provider: Treating Provider/Extender: Tammie West in Treatment: 4 Constitutional Patient is hypertensive.. Pulse regular and within target range for patient.Marland Kitchen Respirations regular, non-labored and within target range.. Temperature is normal and within the target range for the patient.Marland Kitchen Appears in no distress. Respiratory work of breathing is normal. Cardiovascular Pedal pulses are palpable. Notes Wound exam; small punched out area in the area of the left lateral malleolus. Pale looking surface but a lot better. Under illumination mostly epithelialized. No debridement is necessary. Electronic Signature(s) Signed: 06/13/2019 4:51:55 PM By: [name] MD Entered By: [name] on 06/13/2019 16:10:55 -------------------------------------------------------------------------------- Physician Orders Details Patient Name: Date of Service: Tammie West M. 06/13/2019 2:15 PM Medical Record Number: [id number] Patient Account Number: [id number] Date of Birth/Sex: Treating RN: 11-May-1932 (86 y.o. [name] Primary Care Provider: [name] Other Clinician: Referring Provider: Treating Provider/Extender: Tammie West in Treatment: 4 Verbal / Phone Orders: No Diagnosis Coding ICD-10 Coding Code Description [phone] Non-pressure chronic ulcer of left ankle with fat layer exposed E11.51 Type 2  diabetes mellitus with diabetic peripheral angiopathy without gangrene Follow-up Appointments Return Appointment in 2 weeks. Dressing Change Frequency Wound #1 Left,Lateral Ankle Change dressing every day. Skin Barriers/Peri-Wound Care Wound #1 Left,Lateral Ankle Skin Prep Wound Cleansing Wound #1 Left,Lateral Ankle May shower and wash wound with soap and water. Primary Wound Dressing Wound #1 Left,Lateral Ankle Santyl Ointment Secondary Dressing Wound #1 Left,Lateral Ankle Foam Border - or large bandaid Edema Control Patient to wear own compression stockings - both legs daily. Apply in the morning and remove at night. Elevate legs to the level of the heart or above for 30 minutes daily and/or when sitting, a frequency of: - throughout the day Off-Loading Other: - Avoid any pressure on left ankle Electronic Signature(s) Signed: 06/13/2019 4:51:55 PM By: [name] MD Signed: 06/13/2019 5:54:52 PM By: Levan Hurst RN, BSN Entered By: Levan Hurst on 06/13/2019 15:43:41 -------------------------------------------------------------------------------- Problem List Details Patient Name: Date of Service: Tammie West M. 06/13/2019 2:15 PM Medical Record Number: [id number] Patient Account Number: [id number] Date of Birth/Sex: Treating RN: 06/05/1932 (86 y.o. [name] Primary Care Provider: [name] Other Clinician: Referring Provider: Treating Provider/Extender: Tammie West in Treatment: 4 Active Problems ICD-10 Encounter Code Description Active Date MDM Diagnosis L97.322 Non-pressure chronic ulcer of left ankle with fat layer exposed 05/16/2019 No Yes E11.51 Type 2 diabetes mellitus with diabetic peripheral angiopathy without gangrene 05/16/2019 No Yes Inactive Problems Resolved Problems Electronic Signature(s) Signed: 06/13/2019 4:51:55 PM By: [name] MD Entered By: [name] on 06/13/2019  16:07:24 -------------------------------------------------------------------------------- Progress Note Details Patient Name: Date of Service: Tammie West M. 06/13/2019 2:15 PM Medical Record Number: [id number] Patient Account Number: [id number] Date of Birth/Sex: Treating RN: 04-Sep-1932 (86 y.o. F) Primary Care Provider: [name] Other Clinician: Referring Provider: Treating Provider/Extender: Tammie West in Treatment: 4 Subjective History of Present Illness (HPI) ADMISSION 05/16/2019 This is an 86 year old woman with type 2 diabetes. She developed a wound on her left lateral ankle last November. She was seen and followed by podiatry at [facility]. Nonhealing status and increasing pain. She went on to have noninvasive arterial studies that showed a 75 to 99% distal SFA stenosis. She underwent an  angiogram on 04/04/2019 and they were able to stent the left SFA. Marked improvement post revascularization. A recent x-ray of the ankle did not show osteomyelitis. A CT scan was ordered but apparently they been unable to get prior authorization through [employer] which is her insurance. They were using Mepilex. She received a course of doxycycline at the beginning of the month. Noted by Dr. [name] at his last visit to have three-vessel runoff with an ABI of 1.29 on the right and 1.21 on the left. Past medical history; type 2 diabetes gout chronic pain PAD, CAD hypertension and chronic renal failure. Her ABIs were not repeated in our clinic 05/23/2019; small area over the left lateral malleolus. She has had this wound since November. She was revascularized in early March. Plain x-rays did not show osteomyelitis. We started her on Santyl last week. She is applying this daily Apparently her primary doctor stopped her Lasix which was 40 mg but the patient was only taking half of this [20 mg] since then she has got left greater than right  significant increase in edema.. She has support stockings at home 5/6; small area over the left lateral malleolus. We have been using Santyl. The wound bed looks better although somewhat pale. She has several other complaints including a small skin discoloration over the right medial malleolus some plantar foot pain. She does have type 2 diabetes. 5/17; small puncture area over the left lateral malleolus with been using Santyl. The wound bed looks better. She is not felt to have an arterial issue previously reviewed by Dr. [name] Objective Constitutional Patient is hypertensive.. Pulse regular and within target range for patient.Marland Kitchen Respirations regular, non-labored and within target range.. Temperature is normal and within the target range for the patient.Marland Kitchen Appears in no distress. Vitals Time Taken: 3:14 PM, Height: 66 in, Weight: 107 lbs, BMI: 17.3, Temperature: 98. F, Pulse: 70 bpm, Respiratory Rate: 16 breaths/min, Blood Pressure: 136/63 mmHg, Capillary Blood Glucose: 96 mg/dl. General Notes: CBG per patient Respiratory work of breathing is normal. Cardiovascular Pedal pulses are palpable. General Notes: Wound exam; small punched out area in the area of the left lateral malleolus. Pale looking surface but a lot better. Under illumination mostly epithelialized. No debridement is necessary. Integumentary (Hair, Skin) Wound #1 status is Open. Original cause of wound was Gradually Appeared. The wound is located on the Left,Lateral Ankle. The wound measures 0.6cm length x 0.6cm width x 0.1cm depth; 0.283cm^2 area and 0.028cm^3 volume. There is Fat Layer (Subcutaneous Tissue) Exposed exposed. There is no tunneling or undermining noted. There is a medium amount of serosanguineous drainage noted. The wound margin is flat and intact. There is large (67-100%) red, pink granulation within the wound bed. There is a small (1-33%) amount of necrotic tissue within the wound bed including Adherent  Slough. Assessment Active Problems ICD-10 Non-pressure chronic ulcer of left ankle with fat layer exposed Type 2 diabetes mellitus with diabetic peripheral angiopathy without gangrene Procedures Wound #1 Pre-procedure diagnosis of Wound #1 is a Venous Leg Ulcer located on the Left,Lateral Ankle .Severity of Tissue Pre Debridement is: Fat layer exposed. There was a Chemical/Enzymatic/Mechanical debridement. Agent used was [employer]. A time out was conducted at 15:45, prior to the start of the procedure. There was no bleeding. The procedure was tolerated well with a pain level of 0 throughout and a pain level of 0 following the procedure. Post Debridement Measurements: 0.6cm length x 0.6cm width x 0.1cm depth; 0.028cm^3 volume. Character of Wound/Ulcer Post Debridement requires  further debridement. Severity of Tissue Post Debridement is: Fat layer exposed. Post procedure Diagnosis Wound #1: Same as Pre-Procedure Plan Follow-up Appointments: Return Appointment in 2 weeks. Dressing Change Frequency: Wound #1 Left,Lateral Ankle: Change dressing every day. Skin Barriers/Peri-Wound Care: Wound #1 Left,Lateral Ankle: Skin Prep Wound Cleansing: Wound #1 Left,Lateral Ankle: May shower and wash wound with soap and water. Primary Wound Dressing: Wound #1 Left,Lateral Ankle: Santyl Ointment Secondary Dressing: Wound #1 Left,Lateral Ankle: Foam Border - or large bandaid Edema Control: Patient to wear own compression stockings - both legs daily. Apply in the morning and remove at night. Elevate legs to the level of the heart or above for 30 minutes daily and/or when sitting, a frequency of: - throughout the day Off-Loading: Other: - Avoid any pressure on left ankle 1. I am going to continue Santyl. This looks really quite healthy the surface that I was debriding no longer seems to be present and a lot of this is epithelialized. 2. Follow-up in 2 weekso Closed Electronic Signature(s) Signed:  06/13/2019 4:51:55 PM By: [name] MD Entered By: [name] on 06/13/2019 16:11:30 -------------------------------------------------------------------------------- SuperBill Details Patient Name: Date of Service: Tammie West M. 06/13/2019 Medical Record Number: [id number] Patient Account Number: [id number] Date of Birth/Sex: Treating RN: 1932-10-16 (86 y.o. F) Primary Care Provider: [name] Other Clinician: Referring Provider: Treating Provider/Extender: Tammie West in Treatment: 4 Diagnosis Coding ICD-10 Codes Code Description [phone] Non-pressure chronic ulcer of left ankle with fat layer exposed E11.51 Type 2 diabetes mellitus with diabetic peripheral angiopathy without gangrene Facility Procedures CPT4 Code: [id number] Description: 56861 - DEBRIDE W/O ANES NON SELECT Modifier: Quantity: 1 Physician Procedures : CPT4 Code Description Modifier [id number] - WC PHYS LEVEL 3 - EST PT ICD-10 Diagnosis Description Z20.802 Non-pressure chronic ulcer of left ankle with fat layer exposed E11.51 Type 2 diabetes mellitus with diabetic peripheral angiopathy without  gangrene Quantity: 1 Electronic Signature(s) Signed: 06/13/2019 4:51:55 PM By: [name] MD Entered By: [name] on 06/13/2019 16:11:50

## 2019-06-14 ENCOUNTER — Other Ambulatory Visit: Payer: Self-pay

## 2019-06-14 ENCOUNTER — Encounter: Payer: Self-pay | Admitting: Podiatry

## 2019-06-14 ENCOUNTER — Ambulatory Visit: Payer: Medicare HMO | Admitting: Podiatry

## 2019-06-14 VITALS — Temp 96.5°F

## 2019-06-14 DIAGNOSIS — Q828 Other specified congenital malformations of skin: Secondary | ICD-10-CM

## 2019-06-14 DIAGNOSIS — E1151 Type 2 diabetes mellitus with diabetic peripheral angiopathy without gangrene: Secondary | ICD-10-CM | POA: Diagnosis not present

## 2019-06-14 NOTE — Progress Notes (Signed)
Tammie West, SMYSER ([id number]) Visit Report for 06/13/2019 Arrival Information Details Patient Name: Date of Service: Tammie West. 06/13/2019 2:15 PM Medical Record Number: [id number] Patient Account Number: [id number] Date of Birth/Sex: Treating RN: Aug 25, 1932 (86 y.o. Orvan Falconer Primary Care Alyana Kreiter: [name] Other Clinician: Referring Rosalin Buster: Treating Adelynne Joerger/Extender: Gabriel Rainwater in Treatment: 4 Visit Information History Since Last Visit All ordered tests and consults were completed: No Patient Arrived: Ambulatory Added or deleted any medications: No Arrival Time: 15:13 Any new allergies or adverse reactions: No Accompanied By: daughter Had a fall or experienced change in No Transfer Assistance: None activities of daily living that may affect Patient Identification Verified: Yes risk of falls: Secondary Verification Process Completed: Yes Signs or symptoms of abuse/neglect since last visito No Patient Has Alerts: Yes Hospitalized since last visit: No Patient Alerts: Patient on Blood Thinner Implantable device outside of the clinic excluding No L ABI: 1.21 TBI: 0.94 cellular tissue based products placed in the center 03/2019 since last visit: Has Dressing in Place as Prescribed: Yes Pain Present Now: Yes Electronic Signature(s) Signed: 06/14/2019 5:13:38 PM By: Carlene Coria RN Entered By: Carlene Coria on 06/13/2019 15:14:31 -------------------------------------------------------------------------------- Encounter Discharge Information Details Patient Name: Date of Service: Tammie Lowes M. 06/13/2019 2:15 PM Medical Record Number: [id number] Patient Account Number: [id number] Date of Birth/Sex: Treating RN: 1932/05/29 (86 y.o. F) Primary Care Tammie West: [name] Other Clinician: Referring Benay Pomeroy: Treating Daje Stark/Extender: Gabriel Rainwater in  Treatment: 4 Encounter Discharge Information Items Post Procedure Vitals Discharge Condition: Stable Temperature (F): 98 Ambulatory Status: Cane Pulse (bpm): 70 Discharge Destination: Home Respiratory Rate (breaths/min): 16 Transportation: Private Auto Blood Pressure (mmHg): 136/63 Accompanied By: daughter Schedule Follow-up Appointment: Yes Clinical Summary of Care: Electronic Signature(s) Signed: 06/13/2019 5:02:28 PM By: Deon Pilling Entered By: Deon Pilling on 06/13/2019 16:59:48 -------------------------------------------------------------------------------- Lower Extremity Assessment Details Patient Name: Date of Service: Tammie Lowes M. 06/13/2019 2:15 PM Medical Record Number: [id number] Patient Account Number: [id number] Date of Birth/Sex: Treating RN: 1932/03/03 (86 y.o. Orvan Falconer Primary Care Conya Ellinwood: [name] Other Clinician: Referring Earland Reish: Treating Shakora Nordquist/Extender: Gabriel Rainwater in Treatment: 4 Edema Assessment Assessed: [Left: No] [Right: No] Edema: [Left: Ye] [Right: s] Calf Left: Right: Point of Measurement: 37 cm From Medial Instep 33 cm cm Ankle Left: Right: Point of Measurement: 10 cm From Medial Instep 20 cm cm Electronic Signature(s) Signed: 06/14/2019 5:13:38 PM By: Carlene Coria RN Entered By: Carlene Coria on 06/13/2019 15:16:21 -------------------------------------------------------------------------------- Multi Wound Chart Details Patient Name: Date of Service: Tammie Lowes M. 06/13/2019 2:15 PM Medical Record Number: [id number] Patient Account Number: [id number] Date of Birth/Sex: Treating RN: 1932-04-27 (86 y.o. F) Primary Care Tammie West: [name] Other Clinician: Referring Tammie West: Treating Tammie West/Extender: Gabriel Rainwater in Treatment: 4 Vital Signs Height(in): 66 Capillary Blood Glucose(mg/dl): 96 Weight(lbs):  107 Pulse(bpm): 70 Body Mass Index(BMI): 17 Blood Pressure(mmHg): 136/63 Temperature(F): 98. Respiratory Rate(breaths/min): 16 Photos: [1:No Photos Left, Lateral Ankle] [N/A:N/A N/A] Wound Location: [1:Gradually Appeared] [N/A:N/A] Wounding Event: [1:Venous Leg Ulcer] [N/A:N/A] Primary Etiology: [1:Congestive Heart Failure, Coronary] [N/A:N/A] Comorbid History: [1:Artery Disease, Hypertension, Peripheral Venous Disease, Type II Diabetes 12/03/2018] [N/A:N/A] Date Acquired: [1:4] [N/A:N/A] Weeks of Treatment: [1:Open] [N/A:N/A] Wound Status: [1:0.6x0.6x0.1] [N/A:N/A] Measurements L x W x D (cm) [1:0.283] [N/A:N/A] A (cm) : rea [1:0.028] [N/A:N/A] Volume (cm) : [1:-44.40%] [N/A:N/A] % Reduction in Area: [1:28.20%] [N/A:N/A] % Reduction in Volume: [1:Full Thickness Without Exposed] [N/A:N/A] Classification: [  1:Support Structures Medium] [N/A:N/A] Exudate Amount: [1:Serosanguineous] [N/A:N/A] Exudate Type: [1:red, brown] [N/A:N/A] Exudate Color: [1:Flat and Intact] [N/A:N/A] Wound Margin: [1:Large (67-100%)] [N/A:N/A] Granulation Amount: [1:Red, Pink] [N/A:N/A] Granulation Quality: [1:Small (1-33%)] [N/A:N/A] Necrotic Amount: [1:Fat Layer (Subcutaneous Tissue)] [N/A:N/A] Exposed Structures: [1:Exposed: Yes Fascia: No Tendon: No Muscle: No Joint: No Bone: No Small (1-33%)] [N/A:N/A] Epithelialization: [1:Chemical/Enzymatic/Mechanical] [N/A:N/A] Debridement: Pre-procedure Verification/Time Out 15:45 [N/A:N/A] Taken: [1:N/A] [N/A:N/A] Instrument: [1:None] [N/A:N/A] Bleeding: [1:0] [N/A:N/A] Procedural Pain: [1:0] [N/A:N/A] Post Procedural Pain: [1:Procedure was tolerated well] [N/A:N/A] Debridement Treatment Response: [1:0.6x0.6x0.1] [N/A:N/A] Post Debridement Measurements L x W x D (cm) [1:0.028] [N/A:N/A] Post Debridement Volume: (cm) [1:Debridement] [N/A:N/A] Treatment Notes Electronic Signature(s) Signed: 06/13/2019 4:51:55 PM By: [name] MD Entered By: [name] on 06/13/2019 16:07:53 -------------------------------------------------------------------------------- Multi-Disciplinary Care Plan Details Patient Name: Date of Service: Tammie Lowes M. 06/13/2019 2:15 PM Medical Record Number: [id number] Patient Account Number: [id number] Date of Birth/Sex: Treating RN: 02/24/1932 (86 y.o. [name] Primary Care Deloy Archey: [name] Other Clinician: Referring Ophia Shamoon: Treating Darly Fails/Extender: Gabriel Rainwater in Treatment: 4 Active Inactive Abuse / Safety / Falls / Self Care Management Nursing Diagnoses: Potential for falls Potential for injury related to falls Goals: Patient will remain injury free related to falls Date Initiated: 05/16/2019 Target Resolution Date: 07/15/2019 Goal Status: Active Patient/caregiver will verbalize/demonstrate measures taken to prevent injury and/or falls Date Initiated: 05/16/2019 Date Inactivated: 06/13/2019 Target Resolution Date: 06/24/2019 Goal Status: Met Interventions: Assess Activities of Daily Living upon admission and as needed Assess fall risk on admission and as needed Assess: immobility, friction, shearing, incontinence upon admission and as needed Assess impairment of mobility on admission and as needed per policy Assess personal safety and home safety (as indicated) on admission and as needed Assess self care needs on admission and as needed Provide education on fall prevention Notes: Wound/Skin Impairment Nursing Diagnoses: Impaired tissue integrity Knowledge deficit related to ulceration/compromised skin integrity Goals: Patient/caregiver will verbalize understanding of skin care regimen Date Initiated: 05/16/2019 Target Resolution Date: 07/15/2019 Goal Status: Active Ulcer/skin breakdown will have a volume reduction of 30% by week 4 Date Initiated: 05/16/2019 Date Inactivated: 06/13/2019 Target Resolution Date:  06/17/2019 Goal Status: Unmet Unmet Reason: non viable surface Interventions: Assess patient/caregiver ability to obtain necessary supplies Assess patient/caregiver ability to perform ulcer/skin care regimen upon admission and as needed Assess ulceration(s) every visit Provide education on ulcer and skin care Notes: Electronic Signature(s) Signed: 06/13/2019 5:54:52 PM By: Levan Hurst RN, BSN Entered By: Levan Hurst on 06/13/2019 15:41:04 -------------------------------------------------------------------------------- Pain Assessment Details Patient Name: Date of Service: Tammie Lowes M. 06/13/2019 2:15 PM Medical Record Number: [id number] Patient Account Number: [id number] Date of Birth/Sex: Treating RN: [birth year] (86 y.o. Orvan Falconer Primary Care Maryfrances Portugal: [name] Other Clinician: Referring Keyani Rigdon: Treating Maryiah Olvey/Extender: Gabriel Rainwater in Treatment: 4 Active Problems Location of Pain Severity and Description of Pain Patient Has Paino No Site Locations Pain Management and Medication Current Pain Management: Electronic Signature(s) Signed: 06/14/2019 5:13:38 PM By: Carlene Coria RN Entered By: Carlene Coria on 06/13/2019 15:15:29 -------------------------------------------------------------------------------- Patient/Caregiver Education Details Patient Name: Date of Service: Tammie West 5/17/2021andnbsp2:15 PM Medical Record Number: [id number] Patient Account Number: [id number] Date of Birth/Gender: Treating RN: 1932-10-13 (86 y.o. [name] Primary Care Physician: [name] Other Clinician: Referring Physician: Treating Physician/Extender: Gabriel Rainwater in Treatment: 4 Education Assessment Education Provided To: Patient Education Topics Provided Wound/Skin Impairment: Methods: Explain/Verbal Responses: State content  correctly  Electronic Signature(s) Signed: 06/13/2019 5:54:52 PM By: Levan Hurst RN, BSN Entered By: Levan Hurst on 06/13/2019 15:41:17 -------------------------------------------------------------------------------- Wound Assessment Details Patient Name: Date of Service: Tammie Lowes M. 06/13/2019 2:15 PM Medical Record Number: [id number] Patient Account Number: [id number] Date of Birth/Sex: Treating RN: May 09, 1932 (86 y.o. Orvan Falconer Primary Care Idriss Quackenbush: [name] Other Clinician: Referring Vandana Haman: Treating Danyal Adorno/Extender: Gabriel Rainwater in Treatment: 4 Wound Status Wound Number: 1 Primary Venous Leg Ulcer Etiology: Wound Location: Left, Lateral Ankle Wound Open Wounding Event: Gradually Appeared Status: Date Acquired: 12/03/2018 Comorbid Congestive Heart Failure, Coronary Artery Disease, Hypertension, Weeks Of Treatment: 4 History: Peripheral Venous Disease, Type II Diabetes Clustered Wound: No Photos Photo Uploaded By: Mikeal Hawthorne on 06/14/2019 13:28:02 Wound Measurements Length: (cm) 0.6 Width: (cm) 0.6 Depth: (cm) 0.1 Area: (cm) 0.283 Volume: (cm) 0.028 % Reduction in Area: -44.4% % Reduction in Volume: 28.2% Epithelialization: Small (1-33%) Tunneling: No Undermining: No Wound Description Classification: Full Thickness Without Exposed Support Structures Wound Margin: Flat and Intact Exudate Amount: Medium Exudate Type: Serosanguineous Exudate Color: red, brown Foul Odor After Cleansing: No Slough/Fibrino Yes Wound Bed Granulation Amount: Large (67-100%) Exposed Structure Granulation Quality: Red, Pink Fascia Exposed: No Necrotic Amount: Small (1-33%) Fat Layer (Subcutaneous Tissue) Exposed: Yes Necrotic Quality: Adherent Slough Tendon Exposed: No Muscle Exposed: No Joint Exposed: No Bone Exposed: No Treatment Notes Wound #1 (Left, Lateral Ankle) 1. Cleanse With Wound  Cleanser 2. Periwound Care Skin Prep 3. Primary Dressing Applied Santyl 4. Secondary Dressing Foam Border Dressing 5. Secured With Office manager) Signed: 06/14/2019 5:13:38 PM By: Carlene Coria RN Entered By: Carlene Coria on 06/13/2019 15:26:47 -------------------------------------------------------------------------------- Vitals Details Patient Name: Date of Service: Tammie Lowes M. 06/13/2019 2:15 PM Medical Record Number: [id number] Patient Account Number: [id number] Date of Birth/Sex: Treating RN: [birth year] (86 y.o. Orvan Falconer Primary Care Minh Roanhorse: [name] Other Clinician: Referring Shayne Deerman: Treating Antionio Negron/Extender: Gabriel Rainwater in Treatment: 4 Vital Signs Time Taken: 15:14 Temperature (F): 98. Height (in): 66 Pulse (bpm): 70 Weight (lbs): 107 Respiratory Rate (breaths/min): 16 Body Mass Index (BMI): 17.3 Blood Pressure (mmHg): 136/63 Capillary Blood Glucose (mg/dl): 96 Reference Range: 80 - 120 mg / dl Notes CBG per patient Electronic Signature(s) Signed: 06/14/2019 5:13:38 PM By: Carlene Coria RN Entered By: Carlene Coria on 06/13/2019 15:15:23

## 2019-06-14 NOTE — Patient Instructions (Signed)
Diabetes Mellitus and Foot Care Foot care is an important part of your health, especially when you have diabetes. Diabetes may cause you to have problems because of poor blood flow (circulation) to your feet and legs, which can cause your skin to:  Become thinner and drier.  Break more easily.  Heal more slowly.  Peel and crack. You may also have nerve damage (neuropathy) in your legs and feet, causing decreased feeling in them. This means that you may not notice minor injuries to your feet that could lead to more serious problems. Noticing and addressing any potential problems early is the best way to prevent future foot problems. How to care for your feet Foot hygiene  Wash your feet daily with warm water and mild soap. Do not use hot water. Then, pat your feet and the areas between your toes until they are completely dry. Do not soak your feet as this can dry your skin.  Trim your toenails straight across. Do not dig under them or around the cuticle. File the edges of your nails with an emery board or nail file.  Apply a moisturizing lotion or petroleum jelly to the skin on your feet and to dry, brittle toenails. Use lotion that does not contain alcohol and is unscented. Do not apply lotion between your toes. Shoes and socks  Wear clean socks or stockings every day. Make sure they are not too tight. Do not wear knee-high stockings since they may decrease blood flow to your legs.  Wear shoes that fit properly and have enough cushioning. Always look in your shoes before you put them on to be sure there are no objects inside.  To break in new shoes, wear them for just a few hours a day. This prevents injuries on your feet. Wounds, scrapes, corns, and calluses  Check your feet daily for blisters, cuts, bruises, sores, and redness. If you cannot see the bottom of your feet, use a mirror or ask someone for help.  Do not cut corns or calluses or try to remove them with medicine.  If you  find a minor scrape, cut, or break in the skin on your feet, keep it and the skin around it clean and dry. You may clean these areas with mild soap and water. Do not clean the area with peroxide, alcohol, or iodine.  If you have a wound, scrape, corn, or callus on your foot, look at it several times a day to make sure it is healing and not infected. Check for: ? Redness, swelling, or pain. ? Fluid or blood. ? Warmth. ? Pus or a bad smell. General instructions  Do not cross your legs. This may decrease blood flow to your feet.  Do not use heating pads or hot water bottles on your feet. They may burn your skin. If you have lost feeling in your feet or legs, you may not know this is happening until it is too late.  Protect your feet from hot and cold by wearing shoes, such as at the beach or on hot pavement.  Schedule a complete foot exam at least once a year (annually) or more often if you have foot problems. If you have foot problems, report any cuts, sores, or bruises to your health care provider immediately. Contact a health care provider if:  You have a medical condition that increases your risk of infection and you have any cuts, sores, or bruises on your feet.  You have an injury that is not   healing.  You have redness on your legs or feet.  You feel burning or tingling in your legs or feet.  You have pain or cramps in your legs and feet.  Your legs or feet are numb.  Your feet always feel cold.  You have pain around a toenail. Get help right away if:  You have a wound, scrape, corn, or callus on your foot and: ? You have pain, swelling, or redness that gets worse. ? You have fluid or blood coming from the wound, scrape, corn, or callus. ? Your wound, scrape, corn, or callus feels warm to the touch. ? You have pus or a bad smell coming from the wound, scrape, corn, or callus. ? You have a fever. ? You have a red line going up your leg. Summary  Check your feet every day  for cuts, sores, red spots, swelling, and blisters.  Moisturize feet and legs daily.  Wear shoes that fit properly and have enough cushioning.  If you have foot problems, report any cuts, sores, or bruises to your health care provider immediately.  Schedule a complete foot exam at least once a year (annually) or more often if you have foot problems. This information is not intended to replace advice given to you by your health care provider. Make sure you discuss any questions you have with your health care provider. Document Revised: 10/06/2018 Document Reviewed: 02/15/2016 Elsevier Patient Education  2020 [employer]  

## 2019-06-14 NOTE — Progress Notes (Signed)
Subjective: Tammie West presents today follow up painful lesion right 5th digit and at risk foot care. Pt has h/o NIDDM with PAD.   She also has ulceration of left lateral malleolus. She is being treated at [facility]. She states ulcer is better.   She is also being followed by Vascular Surgery, Dr. [name]. She has had LLE percutaneous transluminal angioplasty with stent of left SFA, which improved claudication and lateral malleolar wound.  Her daughter is present during the visit.  Tammie Cha, MD is patient's PCP. Last visit 01/26/2019.  Past Medical History:  Diagnosis Date  . Bilateral carpal tunnel syndrome 02/17/2017  . Carotid artery disease (HCC)    1-39% bilateral stenosis  . Carpal tunnel syndrome   . CKD (chronic kidney disease) stage 4, GFR 15-29 ml/min (HCC)   . Coronary artery disease 11/2014    cath with Prox RCA to Mid RCA lesion, 45% stenosed and Ost LAD to Prox LAD lesion, 30% stenosed byt recent cath and high calcium score by coronary CTA.  . Crohn's disease ([place])   . Depression   . Diabetes mellitus without complication ([place])   . GERD (gastroesophageal reflux disease)   . Goiter   . Hyperlipidemia   . Hypertension   . Hypothyroidism   . Insomnia   . Migraine   . Mitral regurgitation    mild to moderate by echo 04/2017  . Osteoarthritis   . Osteoporosis   . PVD (peripheral vascular disease) ([place])    -s/p stenting of the 99% left mid SFA - followed by Dr. [name]  . Trochanteric bursitis      Current Outpatient Medications on File Prior to Visit  Medication Sig Dispense Refill  . Accu-Chek FastClix Lancets MISC USE 1 LANCET TO CHECK BLOOD SUGAR 3 TIMES DAILY FOR TYPE 2 DIABETES MELLITUS ON INSULIN    . ACCU-CHEK SMARTVIEW test strip USE TO CHECK BLOOD SUGAR THREE TIMES DAILY    . allopurinol (ZYLOPRIM) 100 MG tablet Take 100 mg by mouth daily.    Marland Kitchen amitriptyline (ELAVIL) 25 MG tablet Take 25 mg by mouth at bedtime.     Marland Kitchen amLODipine (NORVASC) 5 MG tablet Take 5 mg by mouth daily.    Marland Kitchen aspirin EC 81 MG tablet Take 1 tablet (81 mg total) by mouth daily.    . BD PEN NEEDLE NANO U/F 32G X 4 MM MISC     . clidinium-chlordiazePOXIDE (LIBRAX) 5-2.5 MG capsule Take 1 capsule by mouth 2 (two) times daily.    . clopidogrel (PLAVIX) 75 MG tablet Take 1 tablet by mouth once daily 30 tablet 11  . doxycycline (VIBRA-TABS) 100 MG tablet Take 1 tablet (100 mg total) by mouth 2 (two) times daily. 20 tablet 0  . escitalopram (LEXAPRO) 20 MG tablet Take 20 mg by mouth daily.    [name] 75 MCG tablet Take 75 mcg by mouth See admin instructions. Take 75 mg Mon-wed-Fri- Sun Take 37.5 mg on Tues. Thurs. Saturday    . furosemide (LASIX) 40 MG tablet Take 40 mg by mouth daily.    . insulin glargine (LANTUS) 100 unit/mL SOPN Inject 5 Units into the skin daily after breakfast.     . lidocaine (XYLOCAINE) 5 % ointment Apply 1 application topically 4 (four) times daily as needed for mild pain.     . mesalamine (LIALDA) 1.2 G EC tablet Take 2.4 g by mouth daily after breakfast.    . MORPHABOND ER 15 MG T12A  Take 15 mg by mouth in the morning and at bedtime.     . nitroGLYCERIN (NITROSTAT) 0.4 MG SL tablet Place 1 tablet (0.4 mg total) under the tongue every 5 (five) minutes as needed for chest pain. 25 tablet 3  . Oxycodone HCl 10 MG TABS Take 10 mg by mouth every 4 (four) hours as needed (Pain).     . polyethylene glycol (MIRALAX / GLYCOLAX) packet Take 17 g by mouth daily as needed for mild constipation.    . Probiotic Product (ALIGN) 4 MG CAPS Take 1 capsule by mouth daily after breakfast.     . SANTYL ointment APPLY OINTMENT TOPICALLY TO WOUND CHANGE DAILY    . simvastatin (ZOCOR) 10 MG tablet Take 10 mg by mouth at bedtime.      No current facility-administered medications on file prior to visit.     Allergies  Allergen Reactions  . Atorvastatin Nausea And Vomiting  . Byetta 10 Mcg Pen [Exenatide] Nausea And Vomiting  .  Cefaclor Other (See Comments)    Pt does not remember the reaction  . Codeine Nausea And Vomiting       . Crestor [Rosuvastatin] Nausea And Vomiting  . Dexilant [Dexlansoprazole] Diarrhea  . Erythromycin Hives and Swelling  . Fosamax [Alendronate Sodium] Other (See Comments)    GI intolerance  . Macrobid [employer] Macro] Other (See Comments)    Pt does not remember the reaction  . Metformin And Related Nausea And Vomiting  . Nsaids     Elevated creatinine   . Penicillins Hives and Swelling    Has patient had a PCN reaction causing immediate rash, facial/tongue/throat swelling, SOB or lightheadedness with hypotension: Yes Has patient had a PCN reaction causing severe rash involving mucus membranes or skin necrosis: No Has patient had a PCN reaction that required hospitalization No Has patient had a PCN reaction occurring within the last 10 years: No If all of the above answers are "NO", then may proceed with Cephalosporin use.  . Tolmetin Other (See Comments)    Elevated creatinine   . Shellfish Allergy Nausea And Vomiting, Other (See Comments) and Rash    Felt spaced out Felt spaced out Felt spaced out    Objective: Tammie West is a pleasant 86 y.o. y.o. Patient Race: White or Caucasian [1]  female in NAD. AAO x 3.  Vitals:   06/14/19 0917  Temp: (!) 96.5 F (35.8 C)   Vascular Examination: Neurovascular status unchanged b/l. Capillary fill time to digits <3 seconds b/l. Faintly palpable pedal pulses b/l. Pedal hair absent b/l Skin temperature gradient within normal limits b/l. +1 pitting edema b/l LE.  Dermatological Examination: Pedal skin is thin shiny, atrophic bilaterally. No interdigital macerations bilaterally. Porokeratotic lesion(s) R 5th toe. No erythema, no edema, no drainage, no flocculence.  Musculoskeletal: Normal muscle strength 5/5 to all lower extremity muscle groups bilaterally. No pain crepitus or joint limitation noted with ROM  b/l. 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  Neurological Examination: Protective sensation diminished with 10g monofilament b/l. Vibratory sensation diminished b/l.  Assessment: 1. Porokeratosis   2. Type II diabetes mellitus with peripheral circulatory disorder (HCC)    Plan: -Examined patient. -Painful porokeratotic lesion(s) R 5th toe pared and enucleated with sterile scalpel blade without incident. Patient noted relief post debridement. -Patient to continue soft, supportive shoe gear daily. -Patient to report any pedal injuries to medical professional immediately. -She is being followed by wound care for left lateral malleolus ulcer. -Dispensed digital toe  cap for right 5th digit protection between visits. Apply every morning. Remove every evening. -Patient/POA to call should there be question/concern in the interim.  Return in about 3 months (around 09/14/2019) for diabetic nail and callus trim/ Plavix.  Marzetta Board, DPM

## 2019-06-16 ENCOUNTER — Encounter (HOSPITAL_BASED_OUTPATIENT_CLINIC_OR_DEPARTMENT_OTHER): Payer: Medicare HMO | Admitting: Internal Medicine

## 2019-06-20 NOTE — Progress Notes (Signed)
Tammie West ([id number]) Visit Report for 06/02/2019 Debridement Details Patient Name: Date of Service: Tammie West. 06/02/2019 1:00 PM Medical Record Number: [id number] Patient Account Number: [id number] Date of Birth/Sex: Treating RN: [birth year] (86 y.o. Tammie West Primary Care Provider: [name] Other Clinician: Referring Provider: Treating Provider/Extender: Gabriel Rainwater in Treatment: 2 Debridement Performed for Assessment: Wound #1 Left,Lateral Ankle Performed By: Clinician Baruch Gouty, RN Debridement Type: Chemical/Enzymatic/Mechanical Agent Used: Santyl Severity of Tissue Pre Debridement: Limited to breakdown of skin Level of Consciousness (Pre-procedure): Awake and Alert Pre-procedure Verification/Time Out Yes - 13:35 Taken: Start Time: 13:36 Pain Control: Lidocaine 4% Topical Solution Bleeding: None End Time: 13:38 Procedural Pain: 0 Post Procedural Pain: 0 Response to Treatment: Procedure was tolerated well Level of Consciousness (Post- Awake and Alert procedure): Post Debridement Measurements of Total Wound Length: (cm) 0.5 Width: (cm) 0.4 Depth: (cm) 0.1 Volume: (cm) 0.016 Character of Wound/Ulcer Post Debridement: Improved Severity of Tissue Post Debridement: Limited to breakdown of skin Post Procedure Diagnosis Same as Pre-procedure Electronic Signature(s) Signed: 06/02/2019 5:30:42 PM By: [name] MD Signed: 06/02/2019 5:34:26 PM By: Deon Pilling Signed: 06/20/2019 1:34:25 PM By: Deon Pilling Entered By: Deon Pilling on 06/02/2019 13:39:28 -------------------------------------------------------------------------------- HPI Details Patient Name: Date of Service: Tammie Lowes M. 06/02/2019 1:00 PM Medical Record Number: [id number] Patient Account Number: [id number] Date of Birth/Sex: Treating RN: 06/16/1932 (86 y.o. Tammie West Primary Care Provider: [name] Other Clinician: Referring Provider: Treating Provider/Extender: Gabriel Rainwater in Treatment: 2 History of Present Illness HPI Description: ADMISSION 05/16/2019 This is an 86 year old woman with type 2 diabetes. She developed a wound on her left lateral ankle last November. She was seen and followed by podiatry at [facility]. Nonhealing status and increasing pain. She went on to have noninvasive arterial studies that showed a 75 to 99% distal SFA stenosis. She underwent an angiogram on 04/04/2019 and they were able to stent the left SFA. Marked improvement post revascularization. A recent x-ray of the ankle did not show osteomyelitis. A CT scan was ordered but apparently they been unable to get prior authorization through [employer] which is her insurance. They were using Mepilex. She received a course of doxycycline at the beginning of the month. Noted by Dr. [name] at his last visit to have three-vessel runoff with an ABI of 1.29 on the right and 1.21 on the left. Past medical history; type 2 diabetes gout chronic pain PAD, CAD hypertension and chronic renal failure. Her ABIs were not repeated in our clinic 05/23/2019; small area over the left lateral malleolus. She has had this wound since November. She was revascularized in early March. Plain x-rays did not show osteomyelitis. We started her on Santyl last week. She is applying this daily Apparently her primary doctor stopped her Lasix which was 40 mg but the patient was only taking half of this [20 mg] since then she has got left greater than right significant increase in edema.. She has support stockings at home 5/6; small area over the left lateral malleolus. We have been using Santyl. The wound bed looks better although somewhat pale. She has several other complaints including a small skin discoloration over the right medial malleolus some plantar foot pain. She does have type 2  diabetes. Electronic Signature(s) Signed: 06/02/2019 5:30:42 PM By: [name] MD Entered By: [name] on 06/02/2019 13:48:30 -------------------------------------------------------------------------------- Physical Exam Details Patient Name: Date of Service: Tammie Lowes  M. 06/02/2019 1:00 PM Medical Record Number: [id number] Patient Account Number: [id number] Date of Birth/Sex: Treating RN: Jun 12, 1932 (86 y.o. Tammie West Primary Care Provider: [name] Other Clinician: Referring Provider: Treating Provider/Extender: Gabriel Rainwater in Treatment: 2 Constitutional Patient is hypertensive.. Pulse regular and within target range for patient.Marland Kitchen Respirations regular, non-labored and within target range.. Temperature is normal and within the target range for the patient.Marland Kitchen Appears in no distress. Respiratory work of breathing is normal. Cardiovascular Dorsalis pedis pulses are robust. She has nonpitting edema in the left greater than right lower leg. Musculoskeletal She has tenderness over the heel minor aspect.Marland Kitchen Psychiatric appears at normal baseline. Notes Wound exam; small punched out area in the left lateral malleolus. Pale looking surface. I did not feel that any debridement was necessary. The wound is measuring smaller we will continue to use the Santyl-based dressings. Electronic Signature(s) Signed: 06/02/2019 5:30:42 PM By: [name] MD Entered By: [name] on 06/02/2019 13:50:53 -------------------------------------------------------------------------------- Physician Orders Details Patient Name: Date of Service: Tammie Lowes M. 06/02/2019 1:00 PM Medical Record Number: [id number] Patient Account Number: [id number] Date of Birth/Sex: Treating RN: 1932-12-07 (86 y.o. Tammie West Primary Care Provider: Other Clinician: [name] Referring Provider: Treating  Provider/Extender: Gabriel Rainwater in Treatment: 2 Verbal / Phone Orders: No Diagnosis Coding ICD-10 Coding Code Description [id number] Non-pressure chronic ulcer of left ankle with fat layer exposed E11.51 Type 2 diabetes mellitus with diabetic peripheral angiopathy without gangrene Follow-up Appointments Return Appointment in 1 week. Dressing Change Frequency Wound #1 Left,Lateral Ankle Change dressing every day. Skin Barriers/Peri-Wound Care Wound #1 Left,Lateral Ankle Skin Prep Wound Cleansing Wound #1 Left,Lateral Ankle May shower and wash wound with soap and water. Primary Wound Dressing Wound #1 Left,Lateral Ankle Santyl Ointment Secondary Dressing Wound #1 Left,Lateral Ankle Foam Border - or large bandaid Edema Control Patient to wear own compression stockings - both legs daily. Apply in the morning and remove at night. Elevate legs to the level of the heart or above for 30 minutes daily and/or when sitting, a frequency of: - throughout the day Off-Loading Other: - Avoid any pressure on left ankle Electronic Signature(s) Signed: 06/02/2019 5:30:42 PM By: [name] MD Signed: 06/02/2019 5:34:26 PM By: Deon Pilling Entered By: Deon Pilling on 06/02/2019 13:39:53 -------------------------------------------------------------------------------- Problem List Details Patient Name: Date of Service: Tammie Lowes M. 06/02/2019 1:00 PM Medical Record Number: [id number] Patient Account Number: [id number] Date of Birth/Sex: Treating RN: August 03, 1932 (86 y.o. Tammie West Primary Care Provider: [name] Other Clinician: Referring Provider: Treating Provider/Extender: Wynelle Bourgeois, Jake Samples in Treatment: 2 Active Problems ICD-10 Encounter Code Description Active Date MDM Diagnosis L97.322 Non-pressure chronic ulcer of left ankle with fat layer exposed 05/16/2019 No Yes E11.51 Type 2 diabetes  mellitus with diabetic peripheral angiopathy without gangrene 05/16/2019 No Yes Inactive Problems Resolved Problems Electronic Signature(s) Signed: 06/02/2019 5:30:42 PM By: [name] MD Entered By: [name] on 06/02/2019 13:47:18 -------------------------------------------------------------------------------- Progress Note Details Patient Name: Date of Service: Tammie Lowes M. 06/02/2019 1:00 PM Medical Record Number: [id number] Patient Account Number: [id number] Date of Birth/Sex: Treating RN: September 25, 1932 (86 y.o. Tammie West Primary Care Provider: [name] Other Clinician: Referring Provider: Treating Provider/Extender: Gabriel Rainwater in Treatment: 2 Subjective History of Present Illness (HPI) ADMISSION 05/16/2019 This is an 86 year old woman with type 2 diabetes. She developed a wound on her left lateral ankle last November. She was seen and followed  by podiatry at [facility]. Nonhealing status and increasing pain. She went on to have noninvasive arterial studies that showed a 75 to 99% distal SFA stenosis. She underwent an angiogram on 04/04/2019 and they were able to stent the left SFA. Marked improvement post revascularization. A recent x-ray of the ankle did not show osteomyelitis. A CT scan was ordered but apparently they been unable to get prior authorization through [employer] which is her insurance. They were using Mepilex. She received a course of doxycycline at the beginning of the month. Noted by Dr. [name] at his last visit to have three-vessel runoff with an ABI of 1.29 on the right and 1.21 on the left. Past medical history; type 2 diabetes gout chronic pain PAD, CAD hypertension and chronic renal failure. Her ABIs were not repeated in our clinic 05/23/2019; small area over the left lateral malleolus. She has had this wound since November. She was revascularized in early March. Plain x-rays did not show  osteomyelitis. We started her on Santyl last week. She is applying this daily Apparently her primary doctor stopped her Lasix which was 40 mg but the patient was only taking half of this [20 mg] since then she has got left greater than right significant increase in edema.. She has support stockings at home 5/6; small area over the left lateral malleolus. We have been using Santyl. The wound bed looks better although somewhat pale. She has several other complaints including a small skin discoloration over the right medial malleolus some plantar foot pain. She does have type 2 diabetes. Objective Constitutional Patient is hypertensive.. Pulse regular and within target range for patient.Marland Kitchen Respirations regular, non-labored and within target range.. Temperature is normal and within the target range for the patient.Marland Kitchen Appears in no distress. Vitals Time Taken: 1:18 PM, Height: 66 in, Weight: 107 lbs, BMI: 17.3, Temperature: 98.2 F, Pulse: 74 bpm, Respiratory Rate: 18 breaths/min, Blood Pressure: 141/56 mmHg. Respiratory work of breathing is normal. Cardiovascular Dorsalis pedis pulses are robust. She has nonpitting edema in the left greater than right lower leg. Musculoskeletal She has tenderness over the heel minor aspect.Marland Kitchen Psychiatric appears at normal baseline. General Notes: Wound exam; small punched out area in the left lateral malleolus. Pale looking surface. I did not feel that any debridement was necessary. The wound is measuring smaller we will continue to use the Santyl-based dressings. Integumentary (Hair, Skin) Wound #1 status is Open. Original cause of wound was Gradually Appeared. The wound is located on the Left,Lateral Ankle. The wound measures 0.5cm length x 0.4cm width x 0.1cm depth; 0.157cm^2 area and 0.016cm^3 volume. There is Fat Layer (Subcutaneous Tissue) Exposed exposed. There is no tunneling or undermining noted. There is a medium amount of serosanguineous drainage noted.  The wound margin is flat and intact. There is large (67-100%) red, pink granulation within the wound bed. There is a small (1-33%) amount of necrotic tissue within the wound bed including Adherent Slough. Assessment Active Problems ICD-10 Non-pressure chronic ulcer of left ankle with fat layer exposed Type 2 diabetes mellitus with diabetic peripheral angiopathy without gangrene Procedures Wound #1 Pre-procedure diagnosis of Wound #1 is a Venous Leg Ulcer located on the Left,Lateral Ankle .Severity of Tissue Pre Debridement is: Limited to breakdown of skin. There was a Chemical/Enzymatic/Mechanical debridement performed by Baruch Gouty, RN. after achieving pain control using Lidocaine 4% T opical Solution. Agent used was [employer]. A time out was conducted at 13:35, prior to the start of the procedure. There was no  bleeding. The procedure was tolerated well with a pain level of 0 throughout and a pain level of 0 following the procedure. Post Debridement Measurements: 0.5cm length x 0.4cm width x 0.1cm depth; 0.016cm^3 volume. Character of Wound/Ulcer Post Debridement is improved. Severity of Tissue Post Debridement is: Limited to breakdown of skin. Post procedure Diagnosis Wound #1: Same as Pre-Procedure Plan Follow-up Appointments: Return Appointment in 1 week. Dressing Change Frequency: Wound #1 Left,Lateral Ankle: Change dressing every day. Skin Barriers/Peri-Wound Care: Wound #1 Left,Lateral Ankle: Skin Prep Wound Cleansing: Wound #1 Left,Lateral Ankle: May shower and wash wound with soap and water. Primary Wound Dressing: Wound #1 Left,Lateral Ankle: Santyl Ointment Secondary Dressing: Wound #1 Left,Lateral Ankle: Foam Border - or large bandaid Edema Control: Patient to wear own compression stockings - both legs daily. Apply in the morning and remove at night. Elevate legs to the level of the heart or above for 30 minutes daily and/or when sitting, a frequency of: - throughout  the day Off-Loading: Other: - Avoid any pressure on left ankle 1. I am continuing with Santyl ointment with border foam 2. The patient has compression stockings although they may be old and I do not think have a lot of compression. We recommended 20/30 below-knee stockings 3. She has been revascularized on the left. The patient still looks somewhat pale in the wound bed although the dimensions were better and I do really did not think there was any need for debridement. We will continue to reevaluate this. Electronic Signature(s) Signed: 06/02/2019 5:30:42 PM By: [name] MD Entered By: [name] on 06/02/2019 13:52:27 -------------------------------------------------------------------------------- SuperBill Details Patient Name: Date of Service: Tammie Lowes M. 06/02/2019 Medical Record Number: [id number] Patient Account Number: [id number] Date of Birth/Sex: Treating RN: 03/06/1932 (86 y.o. Tammie West Primary Care Provider: [name] Other Clinician: Referring Provider: Treating Provider/Extender: Gabriel Rainwater in Treatment: 2 Diagnosis Coding ICD-10 Codes Code Description [phone] Non-pressure chronic ulcer of left ankle with fat layer exposed E11.51 Type 2 diabetes mellitus with diabetic peripheral angiopathy without gangrene Facility Procedures CPT4 Code: [id number] Description: [phone] - DEBRIDE W/O ANES NON SELECT Modifier: Quantity: 1 Physician Procedures : CPT4 Code Description Modifier [id number] - WC PHYS LEVEL 3 - EST PT ICD-10 Diagnosis Description B90.383 Non-pressure chronic ulcer of left ankle with fat layer exposed E11.51 Type 2 diabetes mellitus with diabetic peripheral angiopathy without  gangrene Quantity: 1 Electronic Signature(s) Signed: 06/02/2019 5:30:42 PM By: [name] MD Entered By: [name] on 06/02/2019 13:52:52

## 2019-06-20 NOTE — Progress Notes (Signed)
Tammie, West ([id number]) Visit Report for 06/02/2019 Arrival Information Details Patient Name: Date of Service: Tammie West. 06/02/2019 1:00 PM Medical Record Number: [id number] Patient Account Number: [id number] Date of Birth/Sex: Treating RN: 1932-02-12 (86 y.o. Tammie West, Meta.Reding Primary Care Rowen Wilmer: [name] Other Clinician: Referring Lizet Kelso: Treating Lorena Clearman/Extender: Gabriel Rainwater in Treatment: 2 Visit Information History Since Last Visit Added or deleted any medications: No Patient Arrived: Ambulatory Any new allergies or adverse reactions: No Arrival Time: 13:17 Had a fall or experienced change in No Accompanied By: self activities of daily living that may affect Transfer Assistance: None risk of falls: Patient Identification Verified: Yes Signs or symptoms of abuse/neglect since last visito No Secondary Verification Process Completed: Yes Hospitalized since last visit: No Patient Has Alerts: Yes Implantable device outside of the clinic excluding No Patient Alerts: Patient on Blood Thinner cellular tissue based products placed in the center L ABI: 1.21 TBI: 0.94 since last visit: 03/2019 Has Dressing in Place as Prescribed: Yes Pain Present Now: No Electronic Signature(s) Signed: 06/07/2019 8:52:48 AM By: Sandre Kitty Entered By: Sandre Kitty on 06/02/2019 13:18:27 -------------------------------------------------------------------------------- Encounter Discharge Information Details Patient Name: Date of Service: Tammie Lowes M. 06/02/2019 1:00 PM Medical Record Number: [id number] Patient Account Number: [id number] Date of Birth/Sex: Treating RN: April 11, 1932 (86 y.o. Tammie West Primary Care Nayleah Gamel: [name] Other Clinician: Referring Marticia Reifschneider: Treating Korissa Horsford/Extender: Gabriel Rainwater in Treatment: 2 Encounter Discharge  Information Items Post Procedure Vitals Discharge Condition: Stable Temperature (F): 98.2 Ambulatory Status: Cane Pulse (bpm): 74 Discharge Destination: Home Respiratory Rate (breaths/min): 18 Transportation: Private Auto Blood Pressure (mmHg): 141/56 Accompanied By: daughter Schedule Follow-up Appointment: Yes Clinical Summary of Care: Electronic Signature(s) Signed: 06/02/2019 5:34:26 PM By: Deon Pilling Entered By: Deon Pilling on 06/02/2019 15:04:13 -------------------------------------------------------------------------------- Lower Extremity Assessment Details Patient Name: Date of Service: Tammie Lowes M. 06/02/2019 1:00 PM Medical Record Number: [id number] Patient Account Number: [id number] Date of Birth/Sex: Treating RN: [birth year] (86 y.o. Tammie West Primary Care Haniyah Maciolek: [name] Other Clinician: Referring Cassandra Harbold: Treating Danniel Tones/Extender: Gabriel Rainwater in Treatment: 2 Edema Assessment Assessed: [Left: Yes] [Right: No] Edema: [Left: Ye] [Right: s] Calf Left: Right: Point of Measurement: 37 cm From Medial Instep 35 cm cm Ankle Left: Right: Point of Measurement: 10 cm From Medial Instep 21.5 cm cm Vascular Assessment Pulses: Dorsalis Pedis Palpable: [Left:Yes] Electronic Signature(s) Signed: 06/02/2019 5:34:26 PM By: Deon Pilling Signed: 06/20/2019 1:34:25 PM By: Deon Pilling Entered By: Deon Pilling on 06/02/2019 13:28:06 -------------------------------------------------------------------------------- Multi Wound Chart Details Patient Name: Date of Service: Tammie Lowes M. 06/02/2019 1:00 PM Medical Record Number: [id number] Patient Account Number: [id number] Date of Birth/Sex: Treating RN: [birth year] (86 y.o. Tammie West, Meta.Reding Primary Care Jalie Eiland: [name] Other Clinician: Referring Dmari Schubring: Treating Cailean Heacock/Extender: Gabriel Rainwater in Treatment: 2 Vital Signs Height(in): 66 Pulse(bpm): 74 Weight(lbs): 107 Blood Pressure(mmHg): 141/56 Body Mass Index(BMI): 17 Temperature(F): 98.2 Respiratory Rate(breaths/min): 18 Photos: [1:No Photos Left, Lateral Ankle] [N/A:N/A N/A] Wound Location: [1:Gradually Appeared] [N/A:N/A] Wounding Event: [1:Venous Leg Ulcer] [N/A:N/A] Primary Etiology: [1:Congestive Heart Failure, Coronary] [N/A:N/A] Comorbid History: [1:Artery Disease, Hypertension, Peripheral Venous Disease, Type II Diabetes 12/03/2018] [N/A:N/A] Date Acquired: [1:2] [N/A:N/A] Weeks of Treatment: [1:Open] [N/A:N/A] Wound Status: [1:0.5x0.4x0.1] [N/A:N/A] Measurements L x W x D (cm) [1:0.157] [N/A:N/A] A (cm) : rea [1:0.016] [N/A:N/A] Volume (cm) : [1:19.90%] [N/A:N/A] % Reduction in Area: [1:59.00%] [N/A:N/A] % Reduction in Volume: [1:Full Thickness  Without Exposed] [N/A:N/A] Classification: [1:Support Structures Medium] [N/A:N/A] Exudate A mount: [1:Serosanguineous] [N/A:N/A] Exudate Type: [1:red, brown] [N/A:N/A] Exudate Color: [1:Flat and Intact] [N/A:N/A] Wound Margin: [1:Large (67-100%)] [N/A:N/A] Granulation A mount: [1:Red, Pink] [N/A:N/A] Granulation Quality: [1:Small (1-33%)] [N/A:N/A] Necrotic A mount: [1:Fat Layer (Subcutaneous Tissue)] [N/A:N/A] Exposed Structures: [1:Exposed: Yes Fascia: No Tendon: No Muscle: No Joint: No Bone: No Small (1-33%)] [N/A:N/A] Epithelialization: [1:Chemical/Enzymatic/Mechanical] [N/A:N/A] Debridement: Pre-procedure Verification/Time Out 13:35 [N/A:N/A] Taken: [1:Lidocaine 4% Topical Solution] [N/A:N/A] Pain Control: [1:N/A] [N/A:N/A] Instrument: [1:None] [N/A:N/A] Bleeding: [1:0] [N/A:N/A] Procedural Pain: [1:0] [N/A:N/A] Post Procedural Pain: [1:Procedure was tolerated well] [N/A:N/A] Debridement Treatment Response: [1:0.5x0.4x0.1] [N/A:N/A] Post Debridement Measurements L x W x D (cm) [1:0.016] [N/A:N/A] Post Debridement Volume: (cm)  [1:Debridement] [N/A:N/A] Treatment Notes Electronic Signature(s) Signed: 06/02/2019 5:30:42 PM By: [name] MD Signed: 06/20/2019 1:34:25 PM By: Deon Pilling Entered By: [name] on 06/02/2019 13:47:29 -------------------------------------------------------------------------------- Multi-Disciplinary Care Plan Details Patient Name: Date of Service: Tammie Lowes M. 06/02/2019 1:00 PM Medical Record Number: [id number] Patient Account Number: [id number] Date of Birth/Sex: Treating RN: [birth year] (86 y.o. Tammie West, Tammi Klippel Primary Care Viyan Rosamond: [name] Other Clinician: Referring Anvika Gashi: Treating Alexandre Lightsey/Extender: Gabriel Rainwater in Treatment: 2 Active Inactive Abuse / Safety / Falls / Self Care Management Nursing Diagnoses: Potential for falls Potential for injury related to falls Goals: Patient will remain injury free related to falls Date Initiated: 05/16/2019 Target Resolution Date: 06/24/2019 Goal Status: Active Patient/caregiver will verbalize/demonstrate measures taken to prevent injury and/or falls Date Initiated: 05/16/2019 Target Resolution Date: 06/24/2019 Goal Status: Active Interventions: Assess Activities of Daily Living upon admission and as needed Assess fall risk on admission and as needed Assess: immobility, friction, shearing, incontinence upon admission and as needed Assess impairment of mobility on admission and as needed per policy Assess personal safety and home safety (as indicated) on admission and as needed Assess self care needs on admission and as needed Provide education on fall prevention Notes: Wound/Skin Impairment Nursing Diagnoses: Impaired tissue integrity Knowledge deficit related to ulceration/compromised skin integrity Goals: Patient/caregiver will verbalize understanding of skin care regimen Date Initiated: 05/16/2019 Target Resolution Date: 06/24/2019 Goal Status:  Active Ulcer/skin breakdown will have a volume reduction of 30% by week 4 Date Initiated: 05/16/2019 Target Resolution Date: 06/17/2019 Goal Status: Active Interventions: Assess patient/caregiver ability to obtain necessary supplies Assess patient/caregiver ability to perform ulcer/skin care regimen upon admission and as needed Assess ulceration(s) every visit Provide education on ulcer and skin care Notes: Electronic Signature(s) Signed: 06/02/2019 5:34:26 PM By: Deon Pilling Signed: 06/20/2019 1:34:25 PM By: Deon Pilling Entered By: Deon Pilling on 06/02/2019 13:00:53 -------------------------------------------------------------------------------- Pain Assessment Details Patient Name: Date of Service: Tammie Lowes M. 06/02/2019 1:00 PM Medical Record Number: [id number] Patient Account Number: [id number] Date of Birth/Sex: Treating RN: 1932-02-04 (86 y.o. Tammie West Primary Care Jaylenne Hamelin: [name] Other Clinician: Referring Gaje Tennyson: Treating Cheyenna Pankowski/Extender: Gabriel Rainwater in Treatment: 2 Active Problems Location of Pain Severity and Description of Pain Patient Has Paino No Site Locations Pain Management and Medication Current Pain Management: Electronic Signature(s) Signed: 06/07/2019 8:52:48 AM By: Sandre Kitty Signed: 06/20/2019 1:34:25 PM By: Deon Pilling Entered By: Sandre Kitty on 06/02/2019 13:18:54 -------------------------------------------------------------------------------- Patient/Caregiver Education Details Patient Name: Date of Service: Tammie West 5/6/2021andnbsp1:00 PM Medical Record Number: [id number] Patient Account Number: [id number] Date of Birth/Gender: Treating RN: February 16, 1932 (86 y.o. Tammie West Primary Care Physician: [name] Other Clinician: Referring Physician: Treating Physician/Extender: Wynelle Bourgeois, Jake Samples  in  Treatment: 2 Education Assessment Education Provided To: Patient Education Topics Provided Safety: Handouts: Personal Safety Methods: Explain/Verbal Responses: Reinforcements needed Electronic Signature(s) Signed: 06/02/2019 5:34:26 PM By: Deon Pilling Entered By: Deon Pilling on 06/02/2019 13:01:10 -------------------------------------------------------------------------------- Wound Assessment Details Patient Name: Date of Service: Tammie Lowes M. 06/02/2019 1:00 PM Medical Record Number: [id number] Patient Account Number: [id number] Date of Birth/Sex: Treating RN: Oct 20, 1932 (86 y.o. Tammie West, Meta.Reding Primary Care Anani Gu: [name] Other Clinician: Referring Idaliz Tinkle: Treating Avalin Briley/Extender: Gabriel Rainwater in Treatment: 2 Wound Status Wound Number: 1 Primary Venous Leg Ulcer Etiology: Wound Location: Left, Lateral Ankle Wound Open Wounding Event: Gradually Appeared Status: Date Acquired: 12/03/2018 Comorbid Congestive Heart Failure, Coronary Artery Disease, Hypertension, Weeks Of Treatment: 2 History: Peripheral Venous Disease, Type II Diabetes Clustered Wound: No Photos Photo Uploaded By: Mikeal Hawthorne on 06/03/2019 10:54:55 Wound Measurements Length: (cm) 0.5 Width: (cm) 0.4 Depth: (cm) 0.1 Area: (cm) 0.157 Volume: (cm) 0.016 % Reduction in Area: 19.9% % Reduction in Volume: 59% Epithelialization: Small (1-33%) Tunneling: No Undermining: No Wound Description Classification: Full Thickness Without Exposed Support Structures Wound Margin: Flat and Intact Exudate Amount: Medium Exudate Type: Serosanguineous Exudate Color: red, brown Foul Odor After Cleansing: No Slough/Fibrino Yes Wound Bed Granulation Amount: Large (67-100%) Exposed Structure Granulation Quality: Red, Pink Fascia Exposed: No Necrotic Amount: Small (1-33%) Fat Layer (Subcutaneous Tissue) Exposed: Yes Necrotic Quality: Adherent  Slough Tendon Exposed: No Muscle Exposed: No Joint Exposed: No Bone Exposed: No Electronic Signature(s) Signed: 06/02/2019 5:34:26 PM By: Deon Pilling Signed: 06/20/2019 1:34:25 PM By: Deon Pilling Entered By: Deon Pilling on 06/02/2019 13:28:34 -------------------------------------------------------------------------------- Vitals Details Patient Name: Date of Service: Tammie Lowes M. 06/02/2019 1:00 PM Medical Record Number: [id number] Patient Account Number: [id number] Date of Birth/Sex: Treating RN: 11/28/1932 (86 y.o. Tammie West, Tammi Klippel Primary Care Oliverio Cho: [name] Other Clinician: Referring Bilaal Leib: Treating Ciarrah Rae/Extender: Gabriel Rainwater in Treatment: 2 Vital Signs Time Taken: 13:18 Temperature (F): 98.2 Height (in): 66 Pulse (bpm): 74 Weight (lbs): 107 Respiratory Rate (breaths/min): 18 Body Mass Index (BMI): 17.3 Blood Pressure (mmHg): 141/56 Reference Range: 80 - 120 mg / dl Electronic Signature(s) Signed: 06/07/2019 8:52:48 AM By: Sandre Kitty Entered By: Sandre Kitty on 06/02/2019 13:18:49

## 2019-06-28 ENCOUNTER — Other Ambulatory Visit: Payer: Self-pay

## 2019-06-28 ENCOUNTER — Encounter (HOSPITAL_BASED_OUTPATIENT_CLINIC_OR_DEPARTMENT_OTHER): Payer: Medicare HMO | Attending: Internal Medicine | Admitting: Internal Medicine

## 2019-06-29 NOTE — Progress Notes (Signed)
TALLIA, MOEHRING ([id number]) Visit Report for 06/28/2019 HPI Details Patient Name: Date of Service: [name]. 06/28/2019 1:30 PM Medical Record Number: [id number] Patient Account Number: [id number] Date of Birth/Sex: Treating RN: 06/06/1932 (86 y.o. Orvan Falconer Primary Care Provider: [name] Other Clinician: Referring Provider: Treating Provider/Extender: Gabriel Rainwater in Treatment: 6 History of Present Illness HPI Description: ADMISSION 05/16/2019 This is an 86 year old woman with type 2 diabetes. She developed a wound on her left lateral ankle last November. She was seen and followed by podiatry at [facility]. Nonhealing status and increasing pain. She went on to have noninvasive arterial studies that showed a 75 to 99% distal SFA stenosis. She underwent an angiogram on 04/04/2019 and they were able to stent the left SFA. Marked improvement post revascularization. A recent x-ray of the ankle did not show osteomyelitis. A CT scan was ordered but apparently they been unable to get prior authorization through [employer] which is her insurance. They were using Mepilex. She received a course of doxycycline at the beginning of the month. Noted by Dr. [name] at his last visit to have three-vessel runoff with an ABI of 1.29 on the right and 1.21 on the left. Past medical history; type 2 diabetes gout chronic pain PAD, CAD hypertension and chronic renal failure. Her ABIs were not repeated in our clinic 05/23/2019; small area over the left lateral malleolus. She has had this wound since November. She was revascularized in early March. Plain x-rays did not show osteomyelitis. We started her on Santyl last week. She is applying this daily Apparently her primary doctor stopped her Lasix which was 40 mg but the patient was only taking half of this [20 mg] since then she has got left greater than right significant increase in edema..  She has support stockings at home 5/6; small area over the left lateral malleolus. We have been using Santyl. The wound bed looks better although somewhat pale. She has several other complaints including a small skin discoloration over the right medial malleolus some plantar foot pain. She does have type 2 diabetes. 5/17; small puncture area over the left lateral malleolus with been using Santyl. The wound bed looks better. She is not felt to have an arterial issue previously reviewed by Dr. [name] 6/1; patient small puncture wound of the left lateral malleolus is epithelialized. She used Santyl. She was previously revascularized in early March Electronic Signature(s) Signed: 06/28/2019 5:52:05 PM By: [name] MD Entered By: [name] on 06/28/2019 14:07:10 -------------------------------------------------------------------------------- Physical Exam Details Patient Name: Date of Service: [name] 06/28/2019 1:30 PM Medical Record Number: [id number] Patient Account Number: [id number] Date of Birth/Sex: Treating RN: 1932-10-11 (86 y.o. Orvan Falconer Primary Care Provider: [name] Other Clinician: Referring Provider: Treating Provider/Extender: Gabriel Rainwater in Treatment: 6 Constitutional Sitting or standing Blood Pressure is within target range for patient.. Pulse regular and within target range for patient.Marland Kitchen Respirations regular, non-labored and within target range.. Temperature is normal and within the target range for the patient.Marland Kitchen Appears in no distress. Notes Wound exam; the area on the left lateral malleolus is fully epithelialized. Pedal pulses are palpable no evidence of surrounding infection Electronic Signature(s) Signed: 06/28/2019 5:52:05 PM By: [name] MD Entered By: [name] on 06/28/2019 14:07:48 -------------------------------------------------------------------------------- Physician  Orders Details Patient Name: Date of Service: [name] 06/28/2019 1:30 PM Medical Record Number: [id number] Patient Account Number: [id number] Date of Birth/Sex:  Treating RN: [birth year] (86 y.o. Orvan Falconer Primary Care Provider: [name] Other Clinician: Referring Provider: Treating Provider/Extender: Gabriel Rainwater in Treatment: 6 Verbal / Phone Orders: No Diagnosis Coding ICD-10 Coding Code Description [phone] Non-pressure chronic ulcer of left ankle with fat layer exposed E11.51 Type 2 diabetes mellitus with diabetic peripheral angiopathy without gangrene Discharge From [facility] Services Discharge from [place] - protect area with thick bandaid daily , times 3 weeks Electronic Signature(s) Signed: 06/28/2019 5:52:05 PM By: [name] MD Signed: 06/29/2019 4:36:43 PM By: Carlene Coria RN Entered By: Carlene Coria on 06/28/2019 13:58:59 -------------------------------------------------------------------------------- Problem List Details Patient Name: Date of Service: [name] 06/28/2019 1:30 PM Medical Record Number: [id number] Patient Account Number: [id number] Date of Birth/Sex: Treating RN: 12-Aug-1932 (86 y.o. Orvan Falconer Primary Care Provider: [name] Other Clinician: Referring Provider: Treating Provider/Extender: Gabriel Rainwater in Treatment: 6 Active Problems ICD-10 Encounter Code Description Active Date MDM Diagnosis L97.322 Non-pressure chronic ulcer of left ankle with fat layer exposed 05/16/2019 No Yes E11.51 Type 2 diabetes mellitus with diabetic peripheral angiopathy without gangrene 05/16/2019 No Yes Inactive Problems Resolved Problems Electronic Signature(s) Signed: 06/28/2019 5:52:05 PM By: [name] MD Entered By: [name] on 06/28/2019  14:06:20 -------------------------------------------------------------------------------- Progress Note Details Patient Name: Date of Service: [name] 06/28/2019 1:30 PM Medical Record Number: [id number] Patient Account Number: [id number] Date of Birth/Sex: Treating RN: 1932/09/20 (86 y.o. Orvan Falconer Primary Care Provider: [name] Other Clinician: Referring Provider: Treating Provider/Extender: Gabriel Rainwater in Treatment: 6 Subjective History of Present Illness (HPI) ADMISSION 05/16/2019 This is an 86 year old woman with type 2 diabetes. She developed a wound on her left lateral ankle last November. She was seen and followed by podiatry at [facility]. Nonhealing status and increasing pain. She went on to have noninvasive arterial studies that showed a 75 to 99% distal SFA stenosis. She underwent an angiogram on 04/04/2019 and they were able to stent the left SFA. Marked improvement post revascularization. A recent x-ray of the ankle did not show osteomyelitis. A CT scan was ordered but apparently they been unable to get prior authorization through [employer] which is her insurance. They were using Mepilex. She received a course of doxycycline at the beginning of the month. Noted by Dr. [name] at his last visit to have three-vessel runoff with an ABI of 1.29 on the right and 1.21 on the left. Past medical history; type 2 diabetes gout chronic pain PAD, CAD hypertension and chronic renal failure. Her ABIs were not repeated in our clinic 05/23/2019; small area over the left lateral malleolus. She has had this wound since November. She was revascularized in early March. Plain x-rays did not show osteomyelitis. We started her on Santyl last week. She is applying this daily Apparently her primary doctor stopped her Lasix which was 40 mg but the patient was only taking half of this [20 mg] since then she has got left greater  than right significant increase in edema.. She has support stockings at home 5/6; small area over the left lateral malleolus. We have been using Santyl. The wound bed looks better although somewhat pale. She has several other complaints including a small skin discoloration over the right medial malleolus some plantar foot pain. She does have type 2 diabetes. 5/17; small puncture area over the left lateral malleolus with been using Santyl. The wound bed looks better. She is not felt  to have an arterial issue previously reviewed by Dr. [name] 6/1; patient small puncture wound of the left lateral malleolus is epithelialized. She used Santyl. She was previously revascularized in early March Objective Constitutional Sitting or standing Blood Pressure is within target range for patient.. Pulse regular and within target range for patient.Marland Kitchen Respirations regular, non-labored and within target range.. Temperature is normal and within the target range for the patient.Marland Kitchen Appears in no distress. Vitals Time Taken: 1:46 PM, Height: 66 in, Weight: 107 lbs, BMI: 17.3, Temperature: 98.1 F, Pulse: 63 bpm, Respiratory Rate: 18 breaths/min, Blood Pressure: 115/54 mmHg. General Notes: Wound exam; the area on the left lateral malleolus is fully epithelialized. Pedal pulses are palpable no evidence of surrounding infection Integumentary (Hair, Skin) Wound #1 status is Healed - Epithelialized. Original cause of wound was Gradually Appeared. The wound is located on the Left,Lateral Ankle. The wound measures 0cm length x 0cm width x 0cm depth; 0cm^2 area and 0cm^3 volume. There is no tunneling or undermining noted. There is a none present amount of drainage noted. The wound margin is flat and intact. There is no granulation within the wound bed. There is no necrotic tissue within the wound bed. Assessment Active Problems ICD-10 Non-pressure chronic ulcer of left ankle with fat layer exposed Type 2 diabetes mellitus with  diabetic peripheral angiopathy without gangrene Plan Discharge From [facility] Services: Discharge from [place] - protect area with thick bandaid daily , times 3 weeks 1. The patient can be discharged from the wound care center 2. Advised to keep the area protected with a border foam or thick Band-Aid for the next month. Electronic Signature(s) Signed: 06/28/2019 5:52:05 PM By: [name] MD Entered By: [name] on 06/28/2019 14:08:55 -------------------------------------------------------------------------------- SuperBill Details Patient Name: Date of Service: [name] 06/28/2019 Medical Record Number: [id number] Patient Account Number: [id number] Date of Birth/Sex: Treating RN: Jul 11, 1932 (86 y.o. Orvan Falconer Primary Care Provider: [name] Other Clinician: Referring Provider: Treating Provider/Extender: Gabriel Rainwater in Treatment: 6 Diagnosis Coding ICD-10 Codes Code Description [id number] Non-pressure chronic ulcer of left ankle with fat layer exposed E11.51 Type 2 diabetes mellitus with diabetic peripheral angiopathy without gangrene Physician Procedures : CPT4 Code Description Modifier [id number] - WC PHYS LEVEL 3 - EST PT ICD-10 Diagnosis Description L97.322 Non-pressure chronic ulcer of left ankle with fat layer exposed E11.51 Type 2 diabetes mellitus with diabetic peripheral angiopathy without  gangrene Quantity: 1 Electronic Signature(s) Signed: 06/28/2019 5:52:05 PM By: [name] MD Entered By: [name] on 06/28/2019 14:09:11

## 2019-06-29 NOTE — Progress Notes (Signed)
Tammie West, Tammie West ([id number]) Visit Report for 06/28/2019 Arrival Information Details Patient Name: Date of Service: Tammie West. 06/28/2019 1:30 PM Medical Record Number: [id number] Patient Account Number: [id number] Date of Birth/Sex: Treating RN: 03-10-1932 (86 y.o. Tammie West, Meta.Reding Primary Care Tammie West: Tammie West Other Clinician: Referring Tammie West: Treating Tammie West/Extender: Tammie West in Treatment: 6 Visit Information History Since Last Visit Added or deleted any medications: No Patient Arrived: Tammie West Any new allergies or adverse reactions: No Arrival Time: 13:46 Had a fall or experienced change in No Accompanied By: daughter activities of daily living that may affect Transfer Assistance: None risk of falls: Patient Identification Verified: Yes Signs or symptoms of abuse/neglect since last visito No Secondary Verification Process Completed: Yes Hospitalized since last visit: No Patient Requires Transmission-Based Precautions: No Implantable device outside of the clinic excluding No Patient Has Alerts: Yes cellular tissue based products placed in the center Patient Alerts: Patient on Blood Thinner since last visit: L ABI: 1.21 TBI: 0.94 Has Dressing in Place as Prescribed: Yes 03/2019 Has Compression in Place as Prescribed: Yes Pain Present Now: Yes Electronic Signature(s) Signed: 06/28/2019 5:56:12 PM By: Tammie West Entered By: Tammie West on 06/28/2019 13:46:22 -------------------------------------------------------------------------------- Clinic Level of Care Assessment Details Patient Name: Date of Service: Tammie West. 06/28/2019 1:30 PM Medical Record Number: [id number] Patient Account Number: [id number] Date of Birth/Sex: Treating RN: 1933-01-10 (86 y.o. Tammie West Primary Care Tammie West: Tammie West Other Clinician: Referring Tammie West: Treating Tammie West/Extender:  Tammie West in Treatment: 6 Clinic Level of Care Assessment Items TOOL 4 Quantity Score X- 1 0 Use when only an EandM is performed on FOLLOW-UP visit ASSESSMENTS - Nursing Assessment / Reassessment X- 1 10 Reassessment of Co-morbidities (includes updates in patient status) X- 1 5 Reassessment of Adherence to Treatment Plan ASSESSMENTS - Wound and Skin A ssessment / Reassessment X - Simple Wound Assessment / Reassessment - one wound 1 5 []  - 0 Complex Wound Assessment / Reassessment - multiple wounds []  - 0 Dermatologic / Skin Assessment (not related to wound area) ASSESSMENTS - Focused Assessment []  - 0 Circumferential Edema Measurements - multi extremities []  - 0 Nutritional Assessment / Counseling / Intervention []  - 0 Lower Extremity Assessment (monofilament, tuning fork, pulses) []  - 0 Peripheral Arterial Disease Assessment (using hand held doppler) ASSESSMENTS - Ostomy and/or Continence Assessment and Care []  - 0 Incontinence Assessment and Management []  - 0 Ostomy Care Assessment and Management (repouching, etc.) PROCESS - Coordination of Care X - Simple Patient / Family Education for ongoing care 1 15 []  - 0 Complex (extensive) Patient / Family Education for ongoing care X- 1 10 Staff obtains Programmer, systems, Records, T Results / Process Orders est []  - 0 Staff telephones HHA, Nursing Homes / Clarify orders / etc []  - 0 Routine Transfer to another Facility (non-emergent condition) []  - 0 Routine Hospital Admission (non-emergent condition) []  - 0 New Admissions / Biomedical engineer / Ordering NPWT Apligraf, etc. , []  - 0 Emergency Hospital Admission (emergent condition) X- 1 10 Simple Discharge Coordination []  - 0 Complex (extensive) Discharge Coordination PROCESS - Special Needs []  - 0 Pediatric / Minor Patient Management []  - 0 Isolation Patient Management []  - 0 Hearing / Language / Visual special needs []  -  0 Assessment of Community assistance (transportation, D/C planning, etc.) []  - 0 Additional assistance / Altered mentation []  - 0 Support Surface(s) Assessment (bed, cushion, seat, etc.) INTERVENTIONS - Wound Cleansing / Measurement  X - Simple Wound Cleansing - one wound 1 5 []  - 0 Complex Wound Cleansing - multiple wounds X- 1 5 Wound Imaging (photographs - any number of wounds) []  - 0 Wound Tracing (instead of photographs) X- 1 5 Simple Wound Measurement - one wound []  - 0 Complex Wound Measurement - multiple wounds INTERVENTIONS - Wound Dressings []  - 0 Small Wound Dressing one or multiple wounds []  - 0 Medium Wound Dressing one or multiple wounds []  - 0 Large Wound Dressing one or multiple wounds []  - 0 Application of Medications - topical []  - 0 Application of Medications - injection INTERVENTIONS - Miscellaneous []  - 0 External ear exam []  - 0 Specimen Collection (cultures, biopsies, blood, body fluids, etc.) []  - 0 Specimen(s) / Culture(s) sent or taken to Lab for analysis []  - 0 Patient Transfer (multiple staff / Civil Service fast streamer / Similar devices) []  - 0 Simple Staple / Suture removal (25 or less) []  - 0 Complex Staple / Suture removal (26 or more) []  - 0 Hypo / Hyperglycemic Management (close monitor of Blood Glucose) []  - 0 Ankle / Brachial Index (ABI) - do not check if billed separately X- 1 5 Vital Signs Has the patient been seen at the hospital within the last three years: Yes Total Score: 75 Level Of Care: New/Established - Level 2 Electronic Signature(s) Signed: 06/29/2019 4:36:43 PM By: Tammie Coria RN Entered By: Tammie West on 06/28/2019 14:00:22 -------------------------------------------------------------------------------- Encounter Discharge Information Details Patient Name: Date of Service: Tammie Lowes M. 06/28/2019 1:30 PM Medical Record Number: [id number] Patient Account Number: [id number] Date of Birth/Sex: Treating RN: [birth year]  (86 y.o. Tammie West Primary Care Tammie West: Tammie West Other Clinician: Referring Tammie West: Treating Tammie West/Extender: Tammie West in Treatment: 6 Encounter Discharge Information Items Discharge Condition: Stable Ambulatory Status: Ambulatory Discharge Destination: Home Transportation: Private Auto Accompanied By: family member Schedule Follow-up Appointment: No Clinical Summary of Care: Patient Declined Electronic Signature(s) Signed: 06/29/2019 7:13:26 AM By: Kela Millin Entered By: Kela Millin on 06/28/2019 14:04:55 -------------------------------------------------------------------------------- Lower Extremity Assessment Details Patient Name: Date of Service: Tammie Lowes M. 06/28/2019 1:30 PM Medical Record Number: [id number] Patient Account Number: [id number] Date of Birth/Sex: Treating RN: 1932-04-24 (86 y.o. [name] Primary Care Kariss Longmire: Tammie West Other Clinician: Referring Gaylen Venning: Treating Anah Billard/Extender: Tammie West in Treatment: 6 Edema Assessment Assessed: [Left: Yes] [Right: No] Edema: [Left: Ye] [Right: s] Calf Left: Right: Point of Measurement: 37 cm From Medial Instep 34 cm cm Ankle Left: Right: Point of Measurement: 10 cm From Medial Instep 21 cm cm Vascular Assessment Pulses: Dorsalis Pedis Palpable: [Left:Yes] Electronic Signature(s) Signed: 06/28/2019 5:56:12 PM By: Tammie West Entered By: Tammie West on 06/28/2019 13:51:38 -------------------------------------------------------------------------------- Multi Wound Chart Details Patient Name: Date of Service: Tammie Lowes M. 06/28/2019 1:30 PM Medical Record Number: [id number] Patient Account Number: [id number] Date of Birth/Sex: Treating RN: 07/27/1932 (86 y.o. Tammie West Primary Care Quantasia Stegner: Tammie West Other Clinician: Referring  Trinita Devlin: Treating Sharilyn Geisinger/Extender: Tammie West in Treatment: 6 Vital Signs Height(in): 66 Pulse(bpm): 63 Weight(lbs): 107 Blood Pressure(mmHg): 115/54 Body Mass Index(BMI): 17 Temperature(F): 98.1 Respiratory Rate(breaths/min): 18 Photos: [1:No Photos Left, Lateral Ankle] [N/A:N/A N/A] Wound Location: [1:Gradually Appeared] [N/A:N/A] Wounding Event: [1:Venous Leg Ulcer] [N/A:N/A] Primary Etiology: [1:Congestive Heart Failure, Coronary] [N/A:N/A] Comorbid History: [1:Artery Disease, Hypertension, Peripheral Venous Disease, Type II Diabetes 12/03/2018] [N/A:N/A] Date Acquired: [1:6] [N/A:N/A] Weeks of Treatment: [1:Healed - Epithelialized] [N/A:N/A] Wound Status: [1:0x0x0] [N/A:N/A]  Measurements L x W x D (cm) [1:0] [N/A:N/A] A (cm) : rea [1:0] [N/A:N/A] Volume (cm) : [1:100.00%] [N/A:N/A] % Reduction in Area: [1:100.00%] [N/A:N/A] % Reduction in Volume: [1:Full Thickness Without Exposed] [N/A:N/A] Classification: [1:Support Structures None Present] [N/A:N/A] Exudate Amount: [1:Flat and Intact] [N/A:N/A] Wound Margin: [1:None Present (0%)] [N/A:N/A] Granulation Amount: [1:None Present (0%)] [N/A:N/A] Necrotic Amount: [1:Fascia: No] [N/A:N/A] Exposed Structures: [1:Fat Layer (Subcutaneous Tissue) Exposed: No Tendon: No Muscle: No Joint: No Bone: No Large (67-100%)] [N/A:N/A] Treatment Notes Electronic Signature(s) Signed: 06/28/2019 5:52:05 PM By: [name] MD Signed: 06/29/2019 4:36:43 PM By: Tammie Coria RN Entered By: [name] on 06/28/2019 14:06:28 -------------------------------------------------------------------------------- Multi-Disciplinary Care Plan Details Patient Name: Date of Service: Tammie Lowes M. 06/28/2019 1:30 PM Medical Record Number: [id number] Patient Account Number: [id number] Date of Birth/Sex: Treating RN: [birth year] (86 y.o. Tammie West Primary Care Petula Rotolo: Tammie West Other  Clinician: Referring Berenize Gatlin: Treating Rajat Staver/Extender: Tammie West in Treatment: 6 Active Inactive Electronic Signature(s) Signed: 06/29/2019 4:36:43 PM By: Tammie Coria RN Entered By: Tammie West on 06/28/2019 13:59:22 -------------------------------------------------------------------------------- Pain Assessment Details Patient Name: Date of Service: Tammie West. 06/28/2019 1:30 PM Medical Record Number: [id number] Patient Account Number: [id number] Date of Birth/Sex: Treating RN: [birth year] (86 y.o. [name] Primary Care Charlann Wayne: Tammie West Other Clinician: Referring Jakyri Brunkhorst: Treating Tyge Somers/Extender: Tammie West in Treatment: 6 Active Problems Location of Pain Severity and Description of Pain Patient Has Paino Yes Site Locations Pain Location: Generalized Pain Rate the pain. Current Pain Level: 5 Worst Pain Level: 10 Least Pain Level: 0 Tolerable Pain Level: 8 Pain Management and Medication Current Pain Management: Medication: No Cold Application: No Rest: No Massage: No Activity: No T.E.N.S.: No Heat Application: No Leg drop or elevation: No Is the Current Pain Management Adequate: Adequate How does your wound impact your activities of daily livingo Sleep: No Bathing: No Appetite: No Relationship With Others: No Bladder Continence: No Emotions: No Bowel Continence: No Work: No Toileting: No Drive: No Dressing: No Hobbies: No Electronic Signature(s) Signed: 06/28/2019 5:56:12 PM By: Tammie West Entered By: Tammie West on 06/28/2019 13:51:30 -------------------------------------------------------------------------------- Patient/Caregiver Education Details Patient Name: Date of Service: Tammie West 6/1/2021andnbsp1:30 PM Medical Record Number: [id number] Patient Account Number: [id number] Date of Birth/Gender: Treating  RN: 05-11-1932 (86 y.o. Tammie West Primary Care Physician: Tammie West Other Clinician: Referring Physician: Treating Physician/Extender: Tammie West in Treatment: 6 Education Assessment Education Provided To: Patient Education Topics Provided Wound/Skin Impairment: Methods: Explain/Verbal Responses: State content correctly Electronic Signature(s) Signed: 06/29/2019 4:36:43 PM By: Tammie Coria RN Entered By: Tammie West on 06/28/2019 13:59:36 -------------------------------------------------------------------------------- Wound Assessment Details Patient Name: Date of Service: Tammie Lowes M. 06/28/2019 1:30 PM Medical Record Number: [id number] Patient Account Number: [id number] Date of Birth/Sex: Treating RN: 1932/04/25 (86 y.o. Tammie West Primary Care Gracious Renken: Tammie West Other Clinician: Referring Parminder Cupples: Treating Dorthula Bier/Extender: Tammie West in Treatment: 6 Wound Status Wound Number: 1 Primary Venous Leg Ulcer Etiology: Wound Location: Left, Lateral Ankle Wound Healed - Epithelialized Wounding Event: Gradually Appeared Status: Date Acquired: 12/03/2018 Comorbid Congestive Heart Failure, Coronary Artery Disease, Hypertension, Weeks Of Treatment: 6 History: Peripheral Venous Disease, Type II Diabetes Clustered Wound: No Photos Photo Uploaded By: Mikeal Hawthorne on 06/29/2019 08:42:37 Wound Measurements Length: (cm) Width: (cm) Depth: (cm) Area: (cm) Volume: (cm) 0 % Reduction in Area: 100% 0 % Reduction in Volume: 100% 0 Epithelialization: Large (67-100%) 0  Tunneling: No 0 Undermining: No Wound Description Classification: Full Thickness Without Exposed Support Structures Wound Margin: Flat and Intact Exudate Amount: None Present Foul Odor After Cleansing: No Slough/Fibrino No Wound Bed Granulation Amount: None Present (0%) Exposed  Structure Necrotic Amount: None Present (0%) Fascia Exposed: No Fat Layer (Subcutaneous Tissue) Exposed: No Tendon Exposed: No Muscle Exposed: No Joint Exposed: No Bone Exposed: No Electronic Signature(s) Signed: 06/29/2019 4:36:43 PM By: Tammie Coria RN Entered By: Tammie West on 06/28/2019 14:01:30 -------------------------------------------------------------------------------- Vitals Details Patient Name: Date of Service: Tammie Lowes M. 06/28/2019 1:30 PM Medical Record Number: [id number] Patient Account Number: [id number] Date of Birth/Sex: Treating RN: 04-Jan-1933 (86 y.o. Tammie West, Meta.Reding Primary Care Sarina Robleto: Tammie West Other Clinician: Referring Ritaj Dullea: Treating Elliot Simoneaux/Extender: Tammie West in Treatment: 6 Vital Signs Time Taken: 13:46 Temperature (F): 98.1 Height (in): 66 Pulse (bpm): 63 Weight (lbs): 107 Respiratory Rate (breaths/min): 18 Body Mass Index (BMI): 17.3 Blood Pressure (mmHg): 115/54 Reference Range: 80 - 120 mg / dl Electronic Signature(s) Signed: 06/28/2019 5:56:12 PM By: Tammie West Entered By: Tammie West on 06/28/2019 13:51:14

## 2019-06-30 ENCOUNTER — Ambulatory Visit: Payer: Medicare HMO | Admitting: Podiatry

## 2019-07-12 ENCOUNTER — Ambulatory Visit
Admission: RE | Admit: 2019-07-12 | Discharge: 2019-07-12 | Disposition: A | Discharge status: Home / Self Care | Payer: Medicare HMO | Source: Ambulatory Visit | Attending: Internal Medicine | Admitting: Internal Medicine

## 2019-07-12 ENCOUNTER — Other Ambulatory Visit: Payer: Self-pay

## 2019-07-12 DIAGNOSIS — M810 Age-related osteoporosis without current pathological fracture: Secondary | ICD-10-CM

## 2019-07-25 ENCOUNTER — Other Ambulatory Visit: Payer: Self-pay | Admitting: Internal Medicine

## 2019-07-25 ENCOUNTER — Ambulatory Visit
Admission: RE | Admit: 2019-07-25 | Discharge: 2019-07-25 | Disposition: A | Discharge status: Home / Self Care | Payer: Medicare HMO | Source: Ambulatory Visit | Attending: Internal Medicine | Admitting: Internal Medicine

## 2019-07-25 DIAGNOSIS — S6992XA Unspecified injury of left wrist, hand and finger(s), initial encounter: Secondary | ICD-10-CM

## 2019-07-25 DIAGNOSIS — R609 Edema, unspecified: Secondary | ICD-10-CM

## 2019-07-25 DIAGNOSIS — M25552 Pain in left hip: Secondary | ICD-10-CM

## 2019-07-27 ENCOUNTER — Ambulatory Visit: Payer: Medicare HMO | Admitting: Podiatry

## 2019-08-10 ENCOUNTER — Ambulatory Visit: Payer: Medicare HMO | Admitting: Podiatry

## 2019-09-13 ENCOUNTER — Ambulatory Visit: Payer: Medicare HMO | Admitting: Podiatry

## 2019-09-13 ENCOUNTER — Encounter: Payer: Self-pay | Admitting: Podiatry

## 2019-09-13 ENCOUNTER — Other Ambulatory Visit: Payer: Self-pay

## 2019-09-13 DIAGNOSIS — M79674 Pain in right toe(s): Secondary | ICD-10-CM

## 2019-09-13 DIAGNOSIS — M79675 Pain in left toe(s): Secondary | ICD-10-CM | POA: Diagnosis not present

## 2019-09-13 DIAGNOSIS — B351 Tinea unguium: Secondary | ICD-10-CM

## 2019-09-13 DIAGNOSIS — E1151 Type 2 diabetes mellitus with diabetic peripheral angiopathy without gangrene: Secondary | ICD-10-CM | POA: Diagnosis not present

## 2019-09-13 DIAGNOSIS — Q828 Other specified congenital malformations of skin: Secondary | ICD-10-CM | POA: Diagnosis not present

## 2019-09-15 NOTE — Progress Notes (Signed)
Subjective: Tammie West presents today follow up painful lesion right 5th digit and at risk foot care. Pt has h/o NIDDM with PAD. She states right 5th digit corn is extremely painful today. Denies any redness, drainage or swelling.  She also has ulceration of left lateral malleolus which has healed as of 06/28/2019, with assistance of vascular intervention and outpatient Wound Care.  Her daughter is present during the visit.  [name], MD is patient's PCP. Last visit 07/25/2019.  Past Medical History:  Diagnosis Date  . Bilateral carpal tunnel syndrome 02/17/2017  . Carotid artery disease (HCC)    1-39% bilateral stenosis  . Carpal tunnel syndrome   . CKD (chronic kidney disease) stage 4, GFR 15-29 ml/min (HCC)   . Coronary artery disease 11/2014    cath with Prox RCA to Mid RCA lesion, 45% stenosed and Ost LAD to Prox LAD lesion, 30% stenosed byt recent cath and high calcium score by coronary CTA.  . Crohn's disease ([place])   . Depression   . Diabetes mellitus without complication ([place])   . GERD (gastroesophageal reflux disease)   . Goiter   . Hyperlipidemia   . Hypertension   . Hypothyroidism   . Insomnia   . Migraine   . Mitral regurgitation    mild to moderate by echo 04/2017  . Osteoarthritis   . Osteoporosis   . PVD (peripheral vascular disease) ([place])    -s/p stenting of the 99% left mid SFA - followed by Dr. [name]  . Trochanteric bursitis      Current Outpatient Medications on File Prior to Visit  Medication Sig Dispense Refill  . Accu-Chek FastClix Lancets MISC USE 1 LANCET TO CHECK BLOOD SUGAR 3 TIMES DAILY FOR TYPE 2 DIABETES MELLITUS ON INSULIN    . ACCU-CHEK SMARTVIEW test strip USE TO CHECK BLOOD SUGAR THREE TIMES DAILY    . alendronate (FOSAMAX) 70 MG tablet Take 70 mg by mouth once a week.    Marland Kitchen allopurinol (ZYLOPRIM) 100 MG tablet Take 100 mg by mouth daily.    Marland Kitchen amitriptyline (ELAVIL) 25 MG tablet Take 25 mg by mouth at bedtime.    Marland Kitchen  amLODipine (NORVASC) 5 MG tablet Take 5 mg by mouth daily.    Marland Kitchen aspirin EC 81 MG tablet Take 1 tablet (81 mg total) by mouth daily.    . BD PEN NEEDLE NANO U/F 32G X 4 MM MISC     . clidinium-chlordiazePOXIDE (LIBRAX) 5-2.5 MG capsule Take 1 capsule by mouth 2 (two) times daily.    . clopidogrel (PLAVIX) 75 MG tablet Take 1 tablet by mouth once daily 30 tablet 11  . doxycycline (VIBRA-TABS) 100 MG tablet Take 1 tablet (100 mg total) by mouth 2 (two) times daily. 20 tablet 0  . escitalopram (LEXAPRO) 20 MG tablet Take 20 mg by mouth daily.    [name] 75 MCG tablet Take 75 mcg by mouth See admin instructions. Take 75 mg Mon-wed-Fri- Sun Take 37.5 mg on Tues. Thurs. Saturday    . furosemide (LASIX) 40 MG tablet Take 40 mg by mouth daily.    . insulin glargine (LANTUS) 100 unit/mL SOPN Inject 5 Units into the skin daily after breakfast.     . lidocaine (XYLOCAINE) 5 % ointment Apply 1 application topically 4 (four) times daily as needed for mild pain.     . mesalamine (LIALDA) 1.2 G EC tablet Take 2.4 g by mouth daily after breakfast.    . metoprolol succinate (TOPROL-XL) 50  MG 24 hr tablet Take 50 mg by mouth at bedtime.    [name] ER 15 MG T12A Take 15 mg by mouth in the morning and at bedtime.     . nitroGLYCERIN (NITROSTAT) 0.4 MG SL tablet Place 1 tablet (0.4 mg total) under the tongue every 5 (five) minutes as needed for chest pain. 25 tablet 3  . Oxycodone HCl 10 MG TABS Take 10 mg by mouth every 4 (four) hours as needed (Pain).     . polyethylene glycol (MIRALAX / GLYCOLAX) packet Take 17 g by mouth daily as needed for mild constipation.    . Probiotic Product (ALIGN) 4 MG CAPS Take 1 capsule by mouth daily after breakfast.     . SANTYL ointment APPLY OINTMENT TOPICALLY TO WOUND CHANGE DAILY    . simvastatin (ZOCOR) 10 MG tablet Take 10 mg by mouth at bedtime.      No current facility-administered medications on file prior to visit.     Allergies  Allergen Reactions  .  Atorvastatin Nausea And Vomiting  . Byetta 10 Mcg Pen [Exenatide] Nausea And Vomiting  . Cefaclor Other (See Comments)    Pt does not remember the reaction  . Codeine Nausea And Vomiting       . Crestor [Rosuvastatin] Nausea And Vomiting  . Dexilant [Dexlansoprazole] Diarrhea  . Erythromycin Hives and Swelling  . Fosamax [Alendronate Sodium] Other (See Comments)    GI intolerance  . Macrobid [employer] Macro] Other (See Comments)    Pt does not remember the reaction  . Metformin And Related Nausea And Vomiting  . Nsaids     Elevated creatinine   . Penicillins Hives and Swelling    Has patient had a PCN reaction causing immediate rash, facial/tongue/throat swelling, SOB or lightheadedness with hypotension: Yes Has patient had a PCN reaction causing severe rash involving mucus membranes or skin necrosis: No Has patient had a PCN reaction that required hospitalization No Has patient had a PCN reaction occurring within the last 10 years: No If all of the above answers are "NO", then may proceed with Cephalosporin use.  . Tolmetin Other (See Comments)    Elevated creatinine   . Shellfish Allergy Nausea And Vomiting, Other (See Comments) and Rash    Felt spaced out Felt spaced out Felt spaced out    Objective: Tammie West is a pleasant 86 y.o. y.o. Patient Race: White or Caucasian [1]  female in NAD. AAO x 3.  There were no vitals filed for this visit. Vascular Examination: Neurovascular status unchanged b/l lower extremities. Capillary fill time to digits <3 seconds b/l lower extremities. Faintly palpable pedal pulses b/l. Pedal hair absent. Lower extremity skin temperature gradient within normal limits. Trace edema noted b/l lower extremities.  Dermatological Examination: Pedal skin is thin shiny, atrophic b/l lower extremities. No interdigital macerations bilaterally. Porokeratotic lesion(s) L 5th toe and R 5th toe. No erythema, no edema, no drainage, no  flocculence.  Musculoskeletal: Normal muscle strength 5/5 to all lower extremity muscle groups bilaterally. No pain crepitus or joint limitation noted with ROM b/l. 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  Neurological Examination: Protective sensation diminished with 10g monofilament b/l. Vibratory sensation diminished b/l.  Assessment: 1. Pain due to onychomycosis of toenails of both feet   2. Porokeratosis   3. Type II diabetes mellitus with peripheral circulatory disorder (HCC)     Plan: -Examined patient. -Toenails 1-5 b/l debrided in length and girth without iatrogenic laceration. -Painful porokeratotic lesion(s) bilateral  5th digits pared and enucleated with sterile scalpel blade without incident. Dispensed protective toe pads for daily use. Apply every morning. Remove every evening. -Patient to continue soft, supportive shoe gear daily. -Patient to report any pedal injuries to medical professional immediately. -She has healed left lateral malleolus ulcer with vascular intervention and outpatient wound care. -Patient/POA to call should there be question/concern in the interim.  Return in about 9 weeks (around 11/15/2019) for diabetic nail and callus trim.  Marzetta Board, DPM

## 2019-10-31 ENCOUNTER — Ambulatory Visit (HOSPITAL_COMMUNITY)
Admission: RE | Admit: 2019-10-31 | Discharge: 2019-10-31 | Disposition: A | Discharge status: Home / Self Care | Payer: Medicare HMO | Source: Ambulatory Visit | Attending: Cardiology | Admitting: Cardiology

## 2019-10-31 ENCOUNTER — Other Ambulatory Visit: Payer: Self-pay

## 2019-10-31 DIAGNOSIS — I739 Peripheral vascular disease, unspecified: Secondary | ICD-10-CM | POA: Insufficient documentation

## 2019-11-03 ENCOUNTER — Other Ambulatory Visit: Payer: Self-pay

## 2019-11-03 DIAGNOSIS — I739 Peripheral vascular disease, unspecified: Secondary | ICD-10-CM

## 2019-11-15 ENCOUNTER — Other Ambulatory Visit: Payer: Self-pay

## 2019-11-15 ENCOUNTER — Encounter: Payer: Self-pay | Admitting: Cardiovascular Disease

## 2019-11-15 ENCOUNTER — Ambulatory Visit (INDEPENDENT_AMBULATORY_CARE_PROVIDER_SITE_OTHER): Payer: Medicare HMO | Admitting: Cardiovascular Disease

## 2019-11-15 ENCOUNTER — Ambulatory Visit: Payer: Medicare HMO | Admitting: Cardiovascular Disease

## 2019-11-15 VITALS — BP 138/62 | HR 74 | Ht 66.0 in | Wt 111.4 lb

## 2019-11-15 DIAGNOSIS — Z01818 Encounter for other preprocedural examination: Secondary | ICD-10-CM | POA: Diagnosis not present

## 2019-11-15 DIAGNOSIS — I771 Stricture of artery: Secondary | ICD-10-CM | POA: Insufficient documentation

## 2019-11-15 DIAGNOSIS — I739 Peripheral vascular disease, unspecified: Secondary | ICD-10-CM

## 2019-11-15 LAB — BASIC METABOLIC PANEL
BUN/Creatinine Ratio: 22 (ref 12–28)
BUN: 20 mg/dL (ref 8–27)
CO2: 25 mmol/L (ref 20–29)
Calcium: 9.8 mg/dL (ref 8.7–10.3)
Chloride: 100 mmol/L (ref 96–106)
Creatinine, Ser: 0.89 mg/dL (ref 0.57–1.00)
GFR calc Af Amer: 68 mL/min/{1.73_m2} (ref >59)
GFR calc non Af Amer: 59 mL/min/{1.73_m2} — ABNORMAL LOW (ref >59)
Glucose: 138 mg/dL — ABNORMAL HIGH (ref 65–99)
Potassium: 5 mmol/L (ref 3.5–5.2)
Sodium: 138 mmol/L (ref 134–144)

## 2019-11-15 MED ORDER — NITROGLYCERIN 0.4 MG SL SUBL: 0.4000 mg | SUBLINGUAL_TABLET | SUBLINGUAL | 2 refills | Status: AC | PRN

## 2019-11-15 NOTE — Progress Notes (Signed)
11/15/2019 Tammie West   11-21-1932  [id number]  Primary Physician [name], MD Primary Cardiologist: Lorretta Harp MD [name], [place]  HPI:  Tammie West is a 86 y.o.  thin appearing married Caucasian female mother of 61 children, grandmother of 3 grandchildren who is accompanied by one of her daughters Tammie West.She was referred to me by Dr. Jacqualyn Posey, her podiatrist for nonhealing ulcer on her left lateral malleolus.   I last saw her in the office 05/03/2019.  She has a history of treated hypertension, diabetes and hyperlipidemia. She does continue to smoke. She has noncritical CAD by cath. She has diastolic dysfunction as well. She has complained of left calf claudication. She developed a wound on her left lateral malleolus which has been getting worse despite aggressive local wound care with recent Dopplers that showed a high-frequency signal in her mid left SFA.  I performed peripheral angiography on her 04/04/2019 revealing a 99% mid left SFA stenosis with three-vessel runoff.  I performed PTA and drug-eluting stenting with an Eluvia drug-eluting stent (6 mm x 40 mm).  She had excellent angiographic result.  Her Doppler studies normalized on 04/19/2019, her claudication resolved and her malleolar wound ultimately healed.  Since I saw her in the office 6 months ago her wound is healed.  She denies claudication.  She was complaining of some dizzy episodes and had Doppler studies performed 05/18/2019 revealing high-grade left subclavian artery stenosis with disturbed left vertebral blood flow.   Current Meds  Medication Sig  . Accu-Chek FastClix Lancets MISC USE 1 LANCET TO CHECK BLOOD SUGAR 3 TIMES DAILY FOR TYPE 2 DIABETES MELLITUS ON INSULIN  . ACCU-CHEK SMARTVIEW test strip USE TO CHECK BLOOD SUGAR THREE TIMES DAILY  . allopurinol (ZYLOPRIM) 100 MG tablet Take 100 mg by mouth daily.  Marland Kitchen amitriptyline (ELAVIL) 25 MG tablet Take 25 mg by mouth  at bedtime.  Marland Kitchen amLODipine (NORVASC) 5 MG tablet Take 5 mg by mouth daily.  Marland Kitchen aspirin EC 81 MG tablet Take 1 tablet (81 mg total) by mouth daily.  . clidinium-chlordiazePOXIDE (LIBRAX) 5-2.5 MG capsule Take 1 capsule by mouth 2 (two) times daily.  . clopidogrel (PLAVIX) 75 MG tablet Take 1 tablet by mouth once daily  . escitalopram (LEXAPRO) 20 MG tablet Take 20 mg by mouth daily.  . EUTHYROX 88 MCG tablet Take 88 mcg by mouth See admin instructions.   . furosemide (LASIX) 40 MG tablet Take 20 mg by mouth daily. Take half tablet  . lidocaine (XYLOCAINE) 5 % ointment Apply 1 application topically 4 (four) times daily as needed for mild pain.   . mesalamine (LIALDA) 1.2 G EC tablet Take 2.4 g by mouth daily after breakfast.  . metoprolol succinate (TOPROL-XL) 50 MG 24 hr tablet Take 50 mg by mouth at bedtime.  [name] ER 15 MG T12A Take 15 mg by mouth in the morning and at bedtime.   . nitroGLYCERIN (NITROSTAT) 0.4 MG SL tablet Place 1 tablet (0.4 mg total) under the tongue every 5 (five) minutes as needed for chest pain.  . Oxycodone HCl 10 MG TABS Take 10 mg by mouth every 4 (four) hours as needed (Pain).   . polyethylene glycol (MIRALAX / GLYCOLAX) packet Take 17 g by mouth daily as needed for mild constipation.  . Probiotic Product (ALIGN) 4 MG CAPS Take 1 capsule by mouth daily after breakfast.   . SANTYL ointment Apply topically as needed.   . simvastatin (ZOCOR)  10 MG tablet Take 10 mg by mouth at bedtime.      Allergies  Allergen Reactions  . Atorvastatin Nausea And Vomiting  . Byetta 10 Mcg Pen [Exenatide] Nausea And Vomiting  . Cefaclor Other (See Comments)    Pt does not remember the reaction  . Codeine Nausea And Vomiting       . Crestor [Rosuvastatin] Nausea And Vomiting  . Dexilant [Dexlansoprazole] Diarrhea  . Erythromycin Hives and Swelling  . Fosamax [Alendronate Sodium] Other (See Comments)    GI intolerance  . Macrobid [employer] Macro] Other (See  Comments)    Pt does not remember the reaction  . Metformin And Related Nausea And Vomiting  . Nsaids     Elevated creatinine   . Penicillins Hives and Swelling    Has patient had a PCN reaction causing immediate rash, facial/tongue/throat swelling, SOB or lightheadedness with hypotension: Yes Has patient had a PCN reaction causing severe rash involving mucus membranes or skin necrosis: No Has patient had a PCN reaction that required hospitalization No Has patient had a PCN reaction occurring within the last 10 years: No If all of the above answers are "NO", then may proceed with Cephalosporin use.  . Tolmetin Other (See Comments)    Elevated creatinine   . Shellfish Allergy Nausea And Vomiting, Other (See Comments) and Rash    Felt spaced out Felt spaced out Felt spaced out    Social History   Socioeconomic History  . Marital status: Married    Spouse name: Not on file  . Number of children: Not on file  . Years of education: Not on file  . Highest education level: Not on file  Occupational History  . Not on file  Tobacco Use  . Smoking status: Current Some Day Smoker    Packs/day: 0.25    Types: Cigarettes  . Smokeless tobacco: Never Used  Substance and Sexual Activity  . Alcohol use: No  . Drug use: No  . Sexual activity: Not on file  Other Topics Concern  . Not on file  Social History Narrative  . Not on file   Social Determinants of Health   Financial Resource Strain:   . Difficulty of Paying Living Expenses: Not on file  Food Insecurity:   . Worried About Charity fundraiser in the Last Year: Not on file  . Ran Out of Food in the Last Year: Not on file  Transportation Needs:   . Lack of Transportation (Medical): Not on file  . Lack of Transportation (Non-Medical): Not on file  Physical Activity:   . Days of Exercise per Week: Not on file  . Minutes of Exercise per Session: Not on file  Stress:   . Feeling of Stress : Not on file  Social Connections:     . Frequency of Communication with Friends and Family: Not on file  . Frequency of Social Gatherings with Friends and Family: Not on file  . Attends Religious Services: Not on file  . Active Member of Clubs or Organizations: Not on file  . Attends Archivist Meetings: Not on file  . Marital Status: Not on file  Intimate Partner Violence:   . Fear of Current or Ex-Partner: Not on file  . Emotionally Abused: Not on file  . Physically Abused: Not on file  . Sexually Abused: Not on file     Review of Systems: General: negative for chills, fever, night sweats or weight changes.  Cardiovascular: negative  for chest pain, dyspnea on exertion, edema, orthopnea, palpitations, paroxysmal nocturnal dyspnea or shortness of breath Dermatological: negative for rash Respiratory: negative for cough or wheezing Urologic: negative for hematuria Abdominal: negative for nausea, vomiting, diarrhea, bright red blood per rectum, melena, or hematemesis Neurologic: negative for visual changes, syncope, or dizziness All other systems reviewed and are otherwise negative except as noted above.    Blood pressure 138/62, pulse 74, height 5\' 6"  (1.676 m), weight 111 lb 6.4 oz (50.5 kg).  General appearance: alert and no distress Neck: no adenopathy, no carotid bruit, no JVD, supple, symmetrical, trachea midline, thyroid not enlarged, symmetric, no tenderness/mass/nodules and Left subclavian bruit Lungs: clear to auscultation bilaterally Heart: regular rate and rhythm, S1, S2 normal, no murmur, click, rub or gallop Extremities: extremities normal, atraumatic, no cyanosis or edema Pulses: 2+ and symmetric Skin: Skin color, texture, turgor normal. No rashes or lesions Neurologic: Alert and oriented X 3, normal strength and tone. Normal symmetric reflexes. Normal coordination and gait  EKG sinus rhythm at 74 with right bundle branch block.  I personally reviewed this EKG.  ASSESSMENT AND PLAN:   PVD  (peripheral vascular disease) ([place]) History of PAD with critical limb ischemia and a slowly healing left lateral malleolar ulcer.  She underwent peripheral angiography and intervention by myself 04/04/2019 revealing a 99% mid left SFA stenosis with three-vessel runoff.  I stented her with a 6 mm x 40 mm long E. Lu via drug eluding stent with an excellent result.  Her Pap Dopplers performed 05/18/2019 revealed a left ABI of 1.30 with a widely patent stent.  Her ulcer ultimately healed.  Stenosis of left subclavian artery ([place]) Ms. Dercole has had episodes of presyncope with Doppler studies performed 05/18/2019 revealing high-grade left subclavian artery stenosis with disturbed left vertebral blood flow.  She was placed on Plavix.  She does have some left upper extremity claudication.  She wishes to proceed with further evaluation.  I'm going to get a CT angiogram of her upper extremities and we'll see her back after that for discussion with regard to left subclavian artery stenosis.      Lorretta Harp MD FACP,FACC,FAHA, [facility] 11/15/2019 9:39 AM

## 2019-11-15 NOTE — Patient Instructions (Signed)
Medication Instructions:  Your physician recommends that you continue on your current medications as directed. Please refer to the Current Medication list given to you today.  *If you need a refill on your cardiac medications before your next appointment, please call your pharmacy*  Lab Work: Your physician recommends that you return for lab work in 1 week prior to CT:   BMET  If you have labs (blood work) drawn today and your tests are completely normal, you will receive your results only by: Marland Kitchen MyChart Message (if you have MyChart) OR . A paper copy in the mail If you have any lab test that is abnormal or we need to change your treatment, we will call you to review the results.  Testing/Procedures: Non-Cardiac CT Angiography (CTA), is a special type of CT scan that uses a computer to produce multi-dimensional views of major blood vessels throughout the body. In CT angiography, a contrast material is injected through an IV to help visualize the blood vessels   Follow-Up: At [facility], you and your health needs are our priority.  As part of our continuing mission to provide you with exceptional heart care, we have created designated Provider Care Teams.  These Care Teams include your primary Cardiologist (physician) and Advanced Practice Providers (APPs -  Physician Assistants and Nurse Practitioners) who all work together to provide you with the care you need, when you need it.  Your next appointment:   4 week(s)  The format for your next appointment:   In Person  Provider:   Quay Burow, MD  Other Instructions

## 2019-11-15 NOTE — Assessment & Plan Note (Signed)
History of PAD with critical limb ischemia and a slowly healing left lateral malleolar ulcer.  She underwent peripheral angiography and intervention by myself 04/04/2019 revealing a 99% mid left SFA stenosis with three-vessel runoff.  I stented her with a 6 mm x 40 mm long E. Lu via drug eluding stent with an excellent result.  Her Pap Dopplers performed 05/18/2019 revealed a left ABI of 1.30 with a widely patent stent.  Her ulcer ultimately healed.

## 2019-11-15 NOTE — Assessment & Plan Note (Signed)
Ms. Tammie West has had episodes of presyncope with Doppler studies performed 05/18/2019 revealing high-grade left subclavian artery stenosis with disturbed left vertebral blood flow.  She was placed on Plavix.  She does have some left upper extremity claudication.  She wishes to proceed with further evaluation.  I'm going to get a CT angiogram of her upper extremities and we'll see her back after that for discussion with regard to left subclavian artery stenosis.

## 2019-11-18 ENCOUNTER — Other Ambulatory Visit: Payer: Self-pay

## 2019-11-18 ENCOUNTER — Encounter: Payer: Self-pay | Admitting: Podiatry

## 2019-11-18 ENCOUNTER — Ambulatory Visit: Payer: Medicare HMO | Admitting: Podiatry

## 2019-11-18 DIAGNOSIS — B351 Tinea unguium: Secondary | ICD-10-CM

## 2019-11-18 DIAGNOSIS — M79675 Pain in left toe(s): Secondary | ICD-10-CM | POA: Diagnosis not present

## 2019-11-18 DIAGNOSIS — E1151 Type 2 diabetes mellitus with diabetic peripheral angiopathy without gangrene: Secondary | ICD-10-CM

## 2019-11-18 DIAGNOSIS — M79674 Pain in right toe(s): Secondary | ICD-10-CM

## 2019-11-18 DIAGNOSIS — L84 Corns and callosities: Secondary | ICD-10-CM

## 2019-11-18 DIAGNOSIS — Q828 Other specified congenital malformations of skin: Secondary | ICD-10-CM

## 2019-11-18 NOTE — Progress Notes (Signed)
Subjective: Tammie West presents today follow up painful lesion right 5th digit, at risk foot care. Pt has h/o NIDDM with PAD and painful thick toenails that are difficult to trim. Pain interferes with ambulation. Aggravating factors include wearing enclosed shoe gear. Pain is relieved with periodic professional debridement.. She states right 5th digit corn is extremely painful today. She states a friend told her to put duct tape on lesion. She did and her husband pulled the duct tape off with some of the core in it off causing excrutiating pain. She denies any redness, drainage or swelling.  She also has ulceration of left lateral malleolus which has healed as of 06/28/2019, with assistance of vascular intervention and outpatient Wound Care. Her daughter is present during the visit.  [name], MD is patient's PCP. Last visit 01/26/2019.  She is followed by Dr. [name] for PAD. Has h/o CLI and nonhealing left lateral malleolar wound which healed with vascular intervention and local wound care. Last ABI of LLE is 1.30 with widely patent stent.  Past Medical History:  Diagnosis Date   Bilateral carpal tunnel syndrome 02/17/2017   Carotid artery disease (HCC)    1-39% bilateral stenosis   Carpal tunnel syndrome    CKD (chronic kidney disease) stage 4, GFR 15-29 ml/min (HCC)    Coronary artery disease 11/2014    cath with Prox RCA to Mid RCA lesion, 45% stenosed and Ost LAD to Prox LAD lesion, 30% stenosed byt recent cath and high calcium score by coronary CTA.   Crohn's disease ([place])    Depression    Diabetes mellitus without complication (HCC)    GERD (gastroesophageal reflux disease)    Goiter    Hyperlipidemia    Hypertension    Hypothyroidism    Insomnia    Migraine    Mitral regurgitation    mild to moderate by echo 04/2017   Osteoarthritis    Osteoporosis    PVD (peripheral vascular disease) ([place])    -s/p stenting of the 99% left mid  SFA - followed by Dr. [name]   Trochanteric bursitis      Current Outpatient Medications on File Prior to Visit  Medication Sig Dispense Refill   Accu-Chek FastClix Lancets MISC USE 1 LANCET TO CHECK BLOOD SUGAR 3 TIMES DAILY FOR TYPE 2 DIABETES MELLITUS ON INSULIN     ACCU-CHEK SMARTVIEW test strip USE TO CHECK BLOOD SUGAR THREE TIMES DAILY     allopurinol (ZYLOPRIM) 100 MG tablet Take 100 mg by mouth daily.     amitriptyline (ELAVIL) 25 MG tablet Take 25 mg by mouth at bedtime.     amLODipine (NORVASC) 5 MG tablet Take 5 mg by mouth daily.     aspirin EC 81 MG tablet Take 1 tablet (81 mg total) by mouth daily.     clidinium-chlordiazePOXIDE (LIBRAX) 5-2.5 MG capsule Take 1 capsule by mouth 2 (two) times daily.     clopidogrel (PLAVIX) 75 MG tablet Take 1 tablet by mouth once daily 30 tablet 11   escitalopram (LEXAPRO) 20 MG tablet Take 20 mg by mouth daily.     EUTHYROX 88 MCG tablet Take 88 mcg by mouth See admin instructions.      furosemide (LASIX) 40 MG tablet Take 20 mg by mouth daily. Take half tablet     lidocaine (XYLOCAINE) 5 % ointment Apply 1 application topically 4 (four) times daily as needed for mild pain.      mesalamine (LIALDA) 1.2 G EC tablet Take  2.4 g by mouth daily after breakfast.     metoprolol succinate (TOPROL-XL) 50 MG 24 hr tablet Take 50 mg by mouth at bedtime.     MORPHABOND ER 15 MG T12A Take 15 mg by mouth in the morning and at bedtime.      nitroGLYCERIN (NITROSTAT) 0.4 MG SL tablet Place 1 tablet (0.4 mg total) under the tongue every 5 (five) minutes as needed for chest pain. 25 tablet 2   Oxycodone HCl 10 MG TABS Take 10 mg by mouth every 4 (four) hours as needed (Pain).      polyethylene glycol (MIRALAX / GLYCOLAX) packet Take 17 g by mouth daily as needed for mild constipation.     Probiotic Product (ALIGN) 4 MG CAPS Take 1 capsule by mouth daily after breakfast.      SANTYL ointment Apply topically as needed.      simvastatin  (ZOCOR) 10 MG tablet Take 10 mg by mouth at bedtime.      No current facility-administered medications on file prior to visit.     Allergies  Allergen Reactions   Atorvastatin Nausea And Vomiting   Byetta 10 Mcg Pen [Exenatide] Nausea And Vomiting   Cefaclor Other (See Comments)    Pt does not remember the reaction   Codeine Nausea And Vomiting        Crestor [Rosuvastatin] Nausea And Vomiting   Dexilant [Dexlansoprazole] Diarrhea   Erythromycin Hives and Swelling   Fosamax [Alendronate Sodium] Other (See Comments)    GI intolerance   Macrobid [Nitrofurantoin Monohyd Macro] Other (See Comments)    Pt does not remember the reaction   Metformin And Related Nausea And Vomiting   Nsaids     Elevated creatinine    Penicillins Hives and Swelling    Has patient had a PCN reaction causing immediate rash, facial/tongue/throat swelling, SOB or lightheadedness with hypotension: Yes Has patient had a PCN reaction causing severe rash involving mucus membranes or skin necrosis: No Has patient had a PCN reaction that required hospitalization No Has patient had a PCN reaction occurring within the last 10 years: No If all of the above answers are "NO", then may proceed with Cephalosporin use.   Tolmetin Other (See Comments)    Elevated creatinine    Shellfish Allergy Nausea And Vomiting, Other (See Comments) and Rash    Felt spaced out Felt spaced out Felt spaced out    Objective: Tammie West is a pleasant 86 y.o. Caucasian female, WD, WN in NAD. AAO x 3.  There were no vitals filed for this visit. Vascular Examination: Neurovascular status unchanged b/l lower extremities. Capillary fill time to digits <3 seconds b/l lower extremities. Faintly palpable pedal pulses b/l. Pedal hair absent. Lower extremity skin temperature gradient within normal limits. Trace edema noted b/l lower extremities.  Dermatological Examination: Pedal skin is thin shiny, atrophic b/l lower  extremities. No interdigital macerations bilaterally. Toenails 1-5 b/l elongated, discolored, dystrophic, thickened, crumbly with subungual debris and tenderness to dorsal palpation. Porokeratotic lesion(s) L 5th toe and R 5th toe. No erythema, no edema, no drainage, no flocculence.Left 5th toe digit partial thickness 1.0 x 1.0 x 0.1 cm with tenderness to palpation and subdermal hemorrhage. No ischemia, no gangrene.  Musculoskeletal: Normal muscle strength 5/5 to all lower extremity muscle groups bilaterally. No pain crepitus or joint limitation noted with ROM b/l. 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  Neurological Examination: Protective sensation diminished with 10g monofilament b/l. Vibratory sensation diminished b/l.  Assessment: 1. Pain  due to onychomycosis of toenails of both feet   2. Pre-ulcerative corn or callous   3. Porokeratosis   4. Diabetes mellitus type 2 with peripheral artery disease (HCC)     Plan: -Examined patient. -Toenails 1-5 b/l debrided in length and girth without iatrogenic laceration. -Painful porokeratotic lesion(s) left 5th digit pared and enucleated with sterile scalpel blade without incident. -Partial thickness ulcer pared right 5th digit. Antibiotic ointment applied. Continue digital toe cap for daily protection. -Dispensed protective toe pads for daily use. Apply every morning. Remove every evening. -Patient to continue soft, supportive shoe gear daily. -Patient to report any pedal injuries to medical professional immediately. -She has healed left lateral malleolus ulcer with vascular intervention and outpatient wound care. -Patient/POA to call should there be question/concern in the interim.  Return in about 1 month (around 12/19/2019) for follow up right 5th toe pain.  Marzetta Board, DPM

## 2019-11-20 ENCOUNTER — Encounter: Payer: Self-pay | Admitting: Podiatry

## 2019-11-23 MED ORDER — CLOPIDOGREL BISULFATE 75 MG PO TABS
75.0000 mg | ORAL_TABLET | Freq: Every day | ORAL | 5 refills | Status: DC
Start: 2019-11-23 — End: 2020-02-07

## 2019-12-02 ENCOUNTER — Other Ambulatory Visit: Payer: Self-pay

## 2019-12-02 ENCOUNTER — Ambulatory Visit (HOSPITAL_COMMUNITY)
Admission: RE | Admit: 2019-12-02 | Discharge: 2019-12-02 | Disposition: A | Discharge status: Home / Self Care | Payer: Medicare HMO | Source: Ambulatory Visit | Attending: Cardiovascular Disease | Admitting: Cardiovascular Disease

## 2019-12-02 DIAGNOSIS — I771 Stricture of artery: Secondary | ICD-10-CM

## 2019-12-02 DIAGNOSIS — I739 Peripheral vascular disease, unspecified: Secondary | ICD-10-CM | POA: Diagnosis not present

## 2019-12-02 MED ORDER — IOHEXOL 350 MG/ML SOLN
75.0000 mL | Freq: Once | INTRAVENOUS | Status: AC | PRN
  Administered 2019-12-02: 75 mL via INTRAVENOUS

## 2019-12-13 ENCOUNTER — Other Ambulatory Visit: Payer: Self-pay

## 2019-12-13 ENCOUNTER — Encounter: Payer: Self-pay | Admitting: Cardiovascular Disease

## 2019-12-13 ENCOUNTER — Ambulatory Visit (INDEPENDENT_AMBULATORY_CARE_PROVIDER_SITE_OTHER): Payer: Medicare HMO | Admitting: Cardiovascular Disease

## 2019-12-13 DIAGNOSIS — I771 Stricture of artery: Secondary | ICD-10-CM

## 2019-12-13 MED ORDER — PREDNISONE 50 MG PO TABS: ORAL_TABLET | ORAL | 0 refills | Status: DC

## 2019-12-13 MED ORDER — SODIUM CHLORIDE 0.9% FLUSH: 3.0000 mL | Freq: Two times a day (BID) | INTRAVENOUS | Status: AC

## 2019-12-13 NOTE — Patient Instructions (Signed)
    [place] [address] Dept: [phone] Loc: [place]  12/13/2019  You are scheduled for a Peripheral Angiogram on Monday, November 22 with Dr. Quay Burow.  1. Please arrive at the [facility] (Main Entrance A) at [facility]: [address] [place], [place] [id number] at 7:30 AM (This time is two hours before your procedure to ensure your preparation). Free valet parking service is available.   Special note: Every effort is made to have your procedure done on time. Please understand that emergencies sometimes delay scheduled procedures.  2. Diet: Do not eat solid foods after midnight.  The patient may have clear liquids until 5am upon the day of the procedure.  3. Labs: will be done today   4. Medication instructions in preparation for your procedure:   Contrast Allergy: Yes, Please take Prednisone 50mg  by mouth at: 13 hours prior to procedure and 7 hours prior to procedure.   And prior to leaving home please take last dose of Prednisone 50mg  and Benadryl 50mg  by mouth.  Do NOT take furosemide on the morning of your procedure.   On the morning of your procedure, take your Aspirin and Plavix/Clopidogrel and any morning medicines NOT listed above.  You may use sips of water.  5. Plan for one night stay--bring personal belongings. 6. Bring a current list of your medications and current insurance cards. 7. You MUST have a responsible person to drive you home. 8. Someone MUST be with you the first 24 hours after you arrive home or your discharge will be delayed. 9. Please wear clothes that are easy to get on and off and wear slip-on shoes.  Thank you for allowing [place] to care for you!   -- [facility]  Your physician has requested that you have a carotid duplex. This test is an ultrasound of the  carotid arteries in your neck. It looks at blood flow through these arteries that supply the brain with blood. Allow one hour for this exam. There are no restrictions or special instructions. Schedule one week after procedure.   Your physician recommends that you schedule a follow-up appointment in: 2 weeks after your procedure with Dr. [name].

## 2019-12-13 NOTE — H&P (View-Only) (Signed)
12/13/2019 [name]   Oct 20, 1932  [id number]  Primary Physician Tammie Cha, MD Primary Cardiologist: Tammie Harp MD Tammie West, [place]  HPI:  Tammie West is a 86 y.o.  thin appearing married Caucasian female mother of 43 children, grandmother of 3 grandchildren who is accompanied by one of her daughters Tammie West.She was referred to me by Dr. Jacqualyn West, her podiatrist for nonhealing ulcer on her left lateral malleolus.I last saw her in the office  11/15/2019. She has a history of treated hypertension, diabetes and hyperlipidemia. She does continue to smoke. She has noncritical CAD by cath. She has diastolic dysfunction as well. She has complained of left calf claudication. She developed a wound on her left lateral malleolus which has been getting worse despite aggressive local wound care with recent Dopplers that showed a high-frequency signal in her mid left SFA.  I performed peripheral angiography on her 04/04/2019 revealing a 99% mid left SFA stenosis with three-vessel runoff. I performed PTA and drug-eluting stenting with an Eluviadrug-eluting stent (6 mm x 40 mm). She had excellent angiographic result. Her Doppler studies normalized on 04/19/2019, her claudication resolved and her malleolar wound ultimately healed.  Since I saw her in the office 6 months ago her wound is healed.  She denies claudication.  She was complaining of some dizzy episodes and had Doppler studies performed 05/18/2019 revealing high-grade left subclavian artery stenosis with disturbed left vertebral blood flow.  I ordered a CT angiogram on 12/02/2019 which confirmed high-grade left subclavian artery stenosis.  The patient wishes to proceed with angiography and endovascular therapy.   Current Meds  Medication Sig  . Accu-Chek FastClix Lancets MISC USE 1 LANCET TO CHECK BLOOD SUGAR 3 TIMES DAILY FOR TYPE 2 DIABETES MELLITUS ON INSULIN  . ACCU-CHEK SMARTVIEW test  strip USE TO CHECK BLOOD SUGAR THREE TIMES DAILY  . allopurinol (ZYLOPRIM) 100 MG tablet Take 100 mg by mouth daily.  Marland Kitchen amitriptyline (ELAVIL) 25 MG tablet Take 25 mg by mouth at bedtime.  Marland Kitchen amLODipine (NORVASC) 5 MG tablet Take 5 mg by mouth daily.  Marland Kitchen aspirin EC 81 MG tablet Take 1 tablet (81 mg total) by mouth daily.  . clidinium-chlordiazePOXIDE (LIBRAX) 5-2.5 MG capsule Take 1 capsule by mouth 2 (two) times daily.  . clopidogrel (PLAVIX) 75 MG tablet Take 1 tablet (75 mg total) by mouth daily.  Marland Kitchen escitalopram (LEXAPRO) 20 MG tablet Take 20 mg by mouth daily.  . EUTHYROX 88 MCG tablet Take 88 mcg by mouth See admin instructions.   . furosemide (LASIX) 40 MG tablet Take 20 mg by mouth daily. Take half tablet  . lidocaine (XYLOCAINE) 5 % ointment Apply 1 application topically 4 (four) times daily as needed for mild pain.   . mesalamine (LIALDA) 1.2 G EC tablet Take 2.4 g by mouth daily after breakfast.  . metoprolol succinate (TOPROL-XL) 50 MG 24 hr tablet Take 50 mg by mouth at bedtime.  Tammie West ER 15 MG T12A Take 15 mg by mouth in the morning and at bedtime.   . nitroGLYCERIN (NITROSTAT) 0.4 MG SL tablet Place 1 tablet (0.4 mg total) under the tongue every 5 (five) minutes as needed for chest pain.  . Oxycodone HCl 10 MG TABS Take 10 mg by mouth every 4 (four) hours as needed (Pain).   . polyethylene glycol (MIRALAX / GLYCOLAX) packet Take 17 g by mouth daily as needed for mild constipation.  . Probiotic Product (ALIGN) 4 MG CAPS Take  1 capsule by mouth daily after breakfast.   . SANTYL ointment Apply topically as needed.   . simvastatin (ZOCOR) 10 MG tablet Take 10 mg by mouth at bedtime.      Allergies  Allergen Reactions  . Atorvastatin Nausea And Vomiting  . Byetta 10 Mcg Pen [Exenatide] Nausea And Vomiting  . Cefaclor Other (See Comments)    Pt does not remember the reaction  . Codeine Nausea And Vomiting       . Crestor [Rosuvastatin] Nausea And Vomiting  . Dexilant  [Dexlansoprazole] Diarrhea  . Erythromycin Hives and Swelling  . Fosamax [Alendronate Sodium] Other (See Comments)    GI intolerance  . Macrobid [employer] Macro] Other (See Comments)    Pt does not remember the reaction  . Metformin And Related Nausea And Vomiting  . Nsaids     Elevated creatinine   . Penicillins Hives and Swelling    Has patient had a PCN reaction causing immediate rash, facial/tongue/throat swelling, SOB or lightheadedness with hypotension: Yes Has patient had a PCN reaction causing severe rash involving mucus membranes or skin necrosis: No Has patient had a PCN reaction that required hospitalization No Has patient had a PCN reaction occurring within the last 10 years: No If all of the above answers are "NO", then may proceed with Cephalosporin use.  . Tolmetin Other (See Comments)    Elevated creatinine   . Shellfish Allergy Nausea And Vomiting, Other (See Comments) and Rash    Felt spaced out Felt spaced out Felt spaced out    Social History   Socioeconomic History  . Marital status: Married    Spouse name: Not on file  . Number of children: Not on file  . Years of education: Not on file  . Highest education level: Not on file  Occupational History  . Not on file  Tobacco Use  . Smoking status: Current Some Day Smoker    Packs/day: 0.25    Types: Cigarettes  . Smokeless tobacco: Never Used  Substance and Sexual Activity  . Alcohol use: No  . Drug use: No  . Sexual activity: Not on file  Other Topics Concern  . Not on file  Social History Narrative  . Not on file   Social Determinants of Health   Financial Resource Strain:   . Difficulty of Paying Living Expenses: Not on file  Food Insecurity:   . Worried About Charity fundraiser in the Last Year: Not on file  . Ran Out of Food in the Last Year: Not on file  Transportation Needs:   . Lack of Transportation (Medical): Not on file  . Lack of Transportation (Non-Medical): Not on  file  Physical Activity:   . Days of Exercise per Week: Not on file  . Minutes of Exercise per Session: Not on file  Stress:   . Feeling of Stress : Not on file  Social Connections:   . Frequency of Communication with Friends and Family: Not on file  . Frequency of Social Gatherings with Friends and Family: Not on file  . Attends Religious Services: Not on file  . Active Member of Clubs or Organizations: Not on file  . Attends Archivist Meetings: Not on file  . Marital Status: Not on file  Intimate Partner Violence:   . Fear of Current or Ex-Partner: Not on file  . Emotionally Abused: Not on file  . Physically Abused: Not on file  . Sexually Abused: Not on file  Review of Systems: General: negative for chills, fever, night sweats or weight changes.  Cardiovascular: negative for chest pain, dyspnea on exertion, edema, orthopnea, palpitations, paroxysmal nocturnal dyspnea or shortness of breath Dermatological: negative for rash Respiratory: negative for cough or wheezing Urologic: negative for hematuria Abdominal: negative for nausea, vomiting, diarrhea, bright red blood per rectum, melena, or hematemesis Neurologic: negative for visual changes, syncope, or dizziness All other systems reviewed and are otherwise negative except as noted above.    Blood pressure (!) 167/65, pulse 73, height 5\' 6"  (1.676 m), weight 110 lb (49.9 kg), SpO2 98 %.  General appearance: alert and no distress Neck: no adenopathy, no carotid bruit, no JVD, supple, symmetrical, trachea midline, thyroid not enlarged, symmetric, no tenderness/mass/nodules and Left subclavian bruit Lungs: clear to auscultation bilaterally Heart: regular rate and rhythm, S1, S2 normal, no murmur, click, rub or gallop Extremities: extremities normal, atraumatic, no cyanosis or edema Pulses: 2+ and symmetric Skin: Skin color, texture, turgor normal. No rashes or lesions Neurologic: Alert and oriented X 3, normal  strength and tone. Normal symmetric reflexes. Normal coordination and gait  EKG not performed today  ASSESSMENT AND PLAN:   Stenosis of left subclavian artery ([place]) Tammie West returns today for follow-up.  She did have carotid Doppler study performed 05/18/2019 revealing high-grade left subclavian artery stenosis with retrograde left vertebral filling.  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  She had a CT angiogram which I ordered 12/02/2019 which confirmed this.  She wishes to proceed with angiography and endovascular therapy.      Tammie Harp MD FACP,FACC,FAHA, [facility] 12/13/2019 1:51 PM

## 2019-12-13 NOTE — Assessment & Plan Note (Signed)
Tammie West returns today for follow-up.  She did have carotid Doppler study performed 05/18/2019 revealing high-grade left subclavian artery stenosis with retrograde left vertebral filling.  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  She had a CT angiogram which I ordered 12/02/2019 which confirmed this.  She wishes to proceed with angiography and endovascular therapy.

## 2019-12-13 NOTE — Addendum Note (Signed)
Addended by: Beatrix Fetters on: 12/13/2019 05:11 PM   Modules accepted: Orders, SmartSet

## 2019-12-13 NOTE — Addendum Note (Signed)
Addended by: Beatrix Fetters on: 12/13/2019 03:07 PM   Modules accepted: Orders

## 2019-12-13 NOTE — Progress Notes (Signed)
12/13/2019 Tammie West   1932-11-14  [id number]  Primary Physician Tammie Cha, MD Primary Cardiologist: Tammie Harp MD Tammie West, [place]  HPI:  Tammie West is a 86 y.o.  thin appearing married Caucasian female mother of 52 children, grandmother of 3 grandchildren who is accompanied by one of her daughters Tammie West.She was referred to me by Dr. Jacqualyn West, her podiatrist for nonhealing ulcer on her left lateral malleolus.I last saw her in the office  11/15/2019. She has a history of treated hypertension, diabetes and hyperlipidemia. She does continue to smoke. She has noncritical CAD by cath. She has diastolic dysfunction as well. She has complained of left calf claudication. She developed a wound on her left lateral malleolus which has been getting worse despite aggressive local wound care with recent Dopplers that showed a high-frequency signal in her mid left SFA.  I performed peripheral angiography on her 04/04/2019 revealing a 99% mid left SFA stenosis with three-vessel runoff. I performed PTA and drug-eluting stenting with an Eluviadrug-eluting stent (6 mm x 40 mm). She had excellent angiographic result. Her Doppler studies normalized on 04/19/2019, her claudication resolved and her malleolar wound ultimately healed.  Since I saw her in the office 6 months ago her wound is healed.  She denies claudication.  She was complaining of some dizzy episodes and had Doppler studies performed 05/18/2019 revealing high-grade left subclavian artery stenosis with disturbed left vertebral blood flow.  I ordered a CT angiogram on 12/02/2019 which confirmed high-grade left subclavian artery stenosis.  The patient wishes to proceed with angiography and endovascular therapy.   Current Meds  Medication Sig  . Accu-Chek FastClix Lancets MISC USE 1 LANCET TO CHECK BLOOD SUGAR 3 TIMES DAILY FOR TYPE 2 DIABETES MELLITUS ON INSULIN  . ACCU-CHEK SMARTVIEW test  strip USE TO CHECK BLOOD SUGAR THREE TIMES DAILY  . allopurinol (ZYLOPRIM) 100 MG tablet Take 100 mg by mouth daily.  Marland Kitchen amitriptyline (ELAVIL) 25 MG tablet Take 25 mg by mouth at bedtime.  Marland Kitchen amLODipine (NORVASC) 5 MG tablet Take 5 mg by mouth daily.  Marland Kitchen aspirin EC 81 MG tablet Take 1 tablet (81 mg total) by mouth daily.  . clidinium-chlordiazePOXIDE (LIBRAX) 5-2.5 MG capsule Take 1 capsule by mouth 2 (two) times daily.  . clopidogrel (PLAVIX) 75 MG tablet Take 1 tablet (75 mg total) by mouth daily.  Marland Kitchen escitalopram (LEXAPRO) 20 MG tablet Take 20 mg by mouth daily.  . EUTHYROX 88 MCG tablet Take 88 mcg by mouth See admin instructions.   . furosemide (LASIX) 40 MG tablet Take 20 mg by mouth daily. Take half tablet  . lidocaine (XYLOCAINE) 5 % ointment Apply 1 application topically 4 (four) times daily as needed for mild pain.   . mesalamine (LIALDA) 1.2 G EC tablet Take 2.4 g by mouth daily after breakfast.  . metoprolol succinate (TOPROL-XL) 50 MG 24 hr tablet Take 50 mg by mouth at bedtime.  [name] ER 15 MG T12A Take 15 mg by mouth in the morning and at bedtime.   . nitroGLYCERIN (NITROSTAT) 0.4 MG SL tablet Place 1 tablet (0.4 mg total) under the tongue every 5 (five) minutes as needed for chest pain.  . Oxycodone HCl 10 MG TABS Take 10 mg by mouth every 4 (four) hours as needed (Pain).   . polyethylene glycol (MIRALAX / GLYCOLAX) packet Take 17 g by mouth daily as needed for mild constipation.  . Probiotic Product (ALIGN) 4 MG CAPS Take  1 capsule by mouth daily after breakfast.   . SANTYL ointment Apply topically as needed.   . simvastatin (ZOCOR) 10 MG tablet Take 10 mg by mouth at bedtime.      Allergies  Allergen Reactions  . Atorvastatin Nausea And Vomiting  . Byetta 10 Mcg Pen [Exenatide] Nausea And Vomiting  . Cefaclor Other (See Comments)    Pt does not remember the reaction  . Codeine Nausea And Vomiting       . Crestor [Rosuvastatin] Nausea And Vomiting  . Dexilant  [Dexlansoprazole] Diarrhea  . Erythromycin Hives and Swelling  . Fosamax [Alendronate Sodium] Other (See Comments)    GI intolerance  . Macrobid [employer] Macro] Other (See Comments)    Pt does not remember the reaction  . Metformin And Related Nausea And Vomiting  . Nsaids     Elevated creatinine   . Penicillins Hives and Swelling    Has patient had a PCN reaction causing immediate rash, facial/tongue/throat swelling, SOB or lightheadedness with hypotension: Yes Has patient had a PCN reaction causing severe rash involving mucus membranes or skin necrosis: No Has patient had a PCN reaction that required hospitalization No Has patient had a PCN reaction occurring within the last 10 years: No If all of the above answers are "NO", then may proceed with Cephalosporin use.  . Tolmetin Other (See Comments)    Elevated creatinine   . Shellfish Allergy Nausea And Vomiting, Other (See Comments) and Rash    Felt spaced out Felt spaced out Felt spaced out    Social History   Socioeconomic History  . Marital status: Married    Spouse name: Not on file  . Number of children: Not on file  . Years of education: Not on file  . Highest education level: Not on file  Occupational History  . Not on file  Tobacco Use  . Smoking status: Current Some Day Smoker    Packs/day: 0.25    Types: Cigarettes  . Smokeless tobacco: Never Used  Substance and Sexual Activity  . Alcohol use: No  . Drug use: No  . Sexual activity: Not on file  Other Topics Concern  . Not on file  Social History Narrative  . Not on file   Social Determinants of Health   Financial Resource Strain:   . Difficulty of Paying Living Expenses: Not on file  Food Insecurity:   . Worried About Charity fundraiser in the Last Year: Not on file  . Ran Out of Food in the Last Year: Not on file  Transportation Needs:   . Lack of Transportation (Medical): Not on file  . Lack of Transportation (Non-Medical): Not on  file  Physical Activity:   . Days of Exercise per Week: Not on file  . Minutes of Exercise per Session: Not on file  Stress:   . Feeling of Stress : Not on file  Social Connections:   . Frequency of Communication with Friends and Family: Not on file  . Frequency of Social Gatherings with Friends and Family: Not on file  . Attends Religious Services: Not on file  . Active Member of Clubs or Organizations: Not on file  . Attends Archivist Meetings: Not on file  . Marital Status: Not on file  Intimate Partner Violence:   . Fear of Current or Ex-Partner: Not on file  . Emotionally Abused: Not on file  . Physically Abused: Not on file  . Sexually Abused: Not on file  Review of Systems: General: negative for chills, fever, night sweats or weight changes.  Cardiovascular: negative for chest pain, dyspnea on exertion, edema, orthopnea, palpitations, paroxysmal nocturnal dyspnea or shortness of breath Dermatological: negative for rash Respiratory: negative for cough or wheezing Urologic: negative for hematuria Abdominal: negative for nausea, vomiting, diarrhea, bright red blood per rectum, melena, or hematemesis Neurologic: negative for visual changes, syncope, or dizziness All other systems reviewed and are otherwise negative except as noted above.    Blood pressure (!) 167/65, pulse 73, height 5\' 6"  (1.676 m), weight 110 lb (49.9 kg), SpO2 98 %.  General appearance: alert and no distress Neck: no adenopathy, no carotid bruit, no JVD, supple, symmetrical, trachea midline, thyroid not enlarged, symmetric, no tenderness/mass/nodules and Left subclavian bruit Lungs: clear to auscultation bilaterally Heart: regular rate and rhythm, S1, S2 normal, no murmur, click, rub or gallop Extremities: extremities normal, atraumatic, no cyanosis or edema Pulses: 2+ and symmetric Skin: Skin color, texture, turgor normal. No rashes or lesions Neurologic: Alert and oriented X 3, normal  strength and tone. Normal symmetric reflexes. Normal coordination and gait  EKG not performed today  ASSESSMENT AND PLAN:   Stenosis of left subclavian artery ([place]) Ms. Blanchard returns today for follow-up.  She did have carotid Doppler study performed 05/18/2019 revealing high-grade left subclavian artery stenosis with retrograde left vertebral filling.  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  She had a CT angiogram which I ordered 12/02/2019 which confirmed this.  She wishes to proceed with angiography and endovascular therapy.      Tammie Harp MD FACP,FACC,FAHA, [facility] 12/13/2019 1:51 PM

## 2019-12-14 ENCOUNTER — Ambulatory Visit (INDEPENDENT_AMBULATORY_CARE_PROVIDER_SITE_OTHER): Payer: Medicare HMO | Admitting: Podiatry

## 2019-12-14 ENCOUNTER — Encounter: Payer: Self-pay | Admitting: Podiatry

## 2019-12-14 DIAGNOSIS — E1151 Type 2 diabetes mellitus with diabetic peripheral angiopathy without gangrene: Secondary | ICD-10-CM

## 2019-12-14 DIAGNOSIS — L84 Corns and callosities: Secondary | ICD-10-CM | POA: Diagnosis not present

## 2019-12-14 LAB — CBC
Hematocrit: 34 % (ref 34.0–46.6)
Hemoglobin: 11.1 g/dL (ref 11.1–15.9)
MCH: 31.9 pg (ref 26.6–33.0)
MCHC: 32.6 g/dL (ref 31.5–35.7)
MCV: 98 fL — ABNORMAL HIGH (ref 79–97)
Platelets: 383 10*3/uL (ref 150–450)
RBC: 3.48 x10E6/uL — ABNORMAL LOW (ref 3.77–5.28)
RDW: 11.7 % (ref 11.7–15.4)
WBC: 7.6 10*3/uL (ref 3.4–10.8)

## 2019-12-14 LAB — BASIC METABOLIC PANEL
BUN/Creatinine Ratio: 21 (ref 12–28)
BUN: 20 mg/dL (ref 8–27)
CO2: 23 mmol/L (ref 20–29)
Calcium: 9.9 mg/dL (ref 8.7–10.3)
Chloride: 104 mmol/L (ref 96–106)
Creatinine, Ser: 0.96 mg/dL (ref 0.57–1.00)
GFR calc Af Amer: 62 mL/min/{1.73_m2} (ref >59)
GFR calc non Af Amer: 54 mL/min/{1.73_m2} — ABNORMAL LOW (ref >59)
Glucose: 172 mg/dL — ABNORMAL HIGH (ref 65–99)
Potassium: 4.9 mmol/L (ref 3.5–5.2)
Sodium: 140 mmol/L (ref 134–144)

## 2019-12-15 ENCOUNTER — Telehealth: Payer: Self-pay | Admitting: *Deleted

## 2019-12-15 NOTE — Telephone Encounter (Signed)
Pt contacted pre-PV procedure scheduled at [facility] for: Monday December 19, 2019 9:30 AM Verified arrival time and place: [place] [facility]) at: 7:30 AM   No solid food after midnight prior to cath, clear liquids until 5 AM day of procedure.  CONTRAST ALLERGY: Pt states shellfish allergy-per pt Dr [name] recommended Prednisone/Benadryl Prep: 12/18/19 Prednisone 50 mg 8:30 PM 12/19/19 Prednisone 50 mg 2:30 AM 12/19/19 Prednisone 50 mg and Benadryl 50 mg-just prior to leaving for hospital AM of procedure  Hold: Lasix-day before and day of procedure-GFR 54  Except hold medications AM meds can be  taken pre-cath with sips of water including: ASA 81 mg Plavix 75 mg Prednisone 50 mg Benadryl 50 mg  Pt states her daughter will be driving her to hospital.  Confirmed patient has responsible adult to drive home post procedure and be with patient first 24 hours after arriving home: yes  You are allowed ONE visitor in the waiting room during the time you are at the hospital for your procedure. Both you and your visitor must wear a mask once you enter the hospital.       COVID-19 Pre-Screening Questions:   In the past 14 days have you had a new cough, new headache, new nasal congestion, fever (100.4 or greater) unexplained body aches, new sore throat, or sudden loss of taste or sense of smell? no  In the past 14 days have you been around anyone with known Covid 19? no    Reviewed procedure/mask/visitor instructions, COVID-19 questions with patient.   Pt scheduled for pre-procedure COVID-19 12/16/19 1:45 PM Information provided to patient:  You will need to have the coronavirus test completed prior to your procedure. This is a[facility]at [address], [place], [place] [address]. Please tell them that you are there for procedure testing. Stay in your car and someone will be with you shortly. You will need to quarantine until your procedure  12/19/19

## 2019-12-16 ENCOUNTER — Other Ambulatory Visit (HOSPITAL_COMMUNITY)
Admission: RE | Admit: 2019-12-16 | Discharge: 2019-12-16 | Disposition: A | Discharge status: Home / Self Care | Payer: Medicare HMO | Source: Ambulatory Visit | Attending: Cardiovascular Disease | Admitting: Cardiovascular Disease

## 2019-12-16 DIAGNOSIS — Z20822 Contact with and (suspected) exposure to covid-19: Secondary | ICD-10-CM | POA: Insufficient documentation

## 2019-12-16 DIAGNOSIS — Z01812 Encounter for preprocedural laboratory examination: Secondary | ICD-10-CM | POA: Insufficient documentation

## 2019-12-16 LAB — SARS CORONAVIRUS 2 (TAT 6-24 HRS): SARS Coronavirus 2: NEGATIVE

## 2019-12-18 NOTE — Progress Notes (Signed)
Subjective: Tammie West presents toda for follow up of painful right 5th digit lesion..She denies any redness, drainage or swelling.  She also has history of ulceration of left lateral malleolus which has healed as of 06/28/2019, with assistance of vascular intervention and outpatient Wound Care.   Her daughter is present during the visit.  [name], MD is patient's PCP. Last visit 01/26/2019.  She is followed by Dr. [name] for PAD. Has h/o CLI and nonhealing left lateral malleolar wound which healed with vascular intervention and local wound care. Last ABI of LLE is 1.30 with widely patent stent. She will be having procedure performed on her upper extremity by Dr. [name] on 12/19/2019.  Past Medical History:  Diagnosis Date  . Bilateral carpal tunnel syndrome 02/17/2017  . Carotid artery disease (HCC)    1-39% bilateral stenosis  . Carpal tunnel syndrome   . CKD (chronic kidney disease) stage 4, GFR 15-29 ml/min (HCC)   . Coronary artery disease 11/2014    cath with Prox RCA to Mid RCA lesion, 45% stenosed and Ost LAD to Prox LAD lesion, 30% stenosed byt recent cath and high calcium score by coronary CTA.  . Crohn's disease ([place])   . Depression   . Diabetes mellitus without complication ([place])   . GERD (gastroesophageal reflux disease)   . Goiter   . Hyperlipidemia   . Hypertension   . Hypothyroidism   . Insomnia   . Migraine   . Mitral regurgitation    mild to moderate by echo 04/2017  . Osteoarthritis   . Osteoporosis   . PVD (peripheral vascular disease) ([place])    -s/p stenting of the 99% left mid SFA - followed by Dr. [name]  . Trochanteric bursitis      Current Outpatient Medications on File Prior to Visit  Medication Sig Dispense Refill  . Accu-Chek FastClix Lancets MISC USE 1 LANCET TO CHECK BLOOD SUGAR 3 TIMES DAILY FOR TYPE 2 DIABETES MELLITUS ON INSULIN    . ACCU-CHEK SMARTVIEW test strip USE TO CHECK BLOOD SUGAR THREE TIMES DAILY    .  allopurinol (ZYLOPRIM) 100 MG tablet Take 100 mg by mouth daily.    Marland Kitchen amitriptyline (ELAVIL) 25 MG tablet Take 25 mg by mouth at bedtime.    Marland Kitchen amLODipine (NORVASC) 5 MG tablet Take 5 mg by mouth daily.    Marland Kitchen aspirin EC 81 MG tablet Take 1 tablet (81 mg total) by mouth daily.    . clidinium-chlordiazePOXIDE (LIBRAX) 5-2.5 MG capsule Take 1 capsule by mouth 2 (two) times daily.    . clopidogrel (PLAVIX) 75 MG tablet Take 1 tablet (75 mg total) by mouth daily. 30 tablet 5  . escitalopram (LEXAPRO) 20 MG tablet Take 20 mg by mouth daily.    . EUTHYROX 88 MCG tablet Take 88 mcg by mouth daily before breakfast.     . furosemide (LASIX) 40 MG tablet Take 20 mg by mouth daily.     Marland Kitchen lidocaine (XYLOCAINE) 5 % ointment Apply 1 application topically 4 (four) times daily as needed for mild pain.     . mesalamine (LIALDA) 1.2 G EC tablet Take 2.4 g by mouth daily after breakfast.    . metoprolol succinate (TOPROL-XL) 50 MG 24 hr tablet Take 50 mg by mouth at bedtime.    [name] ER 15 MG T12A Take 15 mg by mouth in the morning and at bedtime.     . nitroGLYCERIN (NITROSTAT) 0.4 MG SL tablet Place 1 tablet (  0.4 mg total) under the tongue every 5 (five) minutes as needed for chest pain. 25 tablet 2  . Oxycodone HCl 10 MG TABS Take 10 mg by mouth every 4 (four) hours as needed (Pain).     . polyethylene glycol (MIRALAX / GLYCOLAX) packet Take 17 g by mouth daily as needed for mild constipation.    . predniSONE (DELTASONE) 50 MG tablet 1 tablet 13 hours prior to procedure 1 tablet 7 hours prior to procedure 1 tablet 1 hour prior to procedure. (Patient taking differently: Take 50 mg by mouth once. 50 mg 13 hours prior to procedure 50 mg   7 hours prior to procedure 50 mg 1 hour prior to procedure.) 3 tablet 0  . Probiotic Product (ALIGN) 4 MG CAPS Take 1 capsule by mouth daily after breakfast.     . simvastatin (ZOCOR) 10 MG tablet Take 10 mg by mouth at bedtime.      Current Facility-Administered Medications  on File Prior to Visit  Medication Dose Route Frequency Provider Last Rate Last Admin  . sodium chloride flush (NS) 0.9 % injection 3 mL  3 mL Intravenous Q12H Lorretta Harp, MD         Allergies  Allergen Reactions  . Atorvastatin Nausea And Vomiting  . Byetta 10 Mcg Pen [Exenatide] Nausea And Vomiting  . Cefaclor Other (See Comments)    Pt does not remember the reaction  . Codeine Nausea And Vomiting       . Crestor [Rosuvastatin] Nausea And Vomiting  . Dexilant [Dexlansoprazole] Diarrhea  . Erythromycin Hives and Swelling  . Fosamax [Alendronate Sodium] Other (See Comments)    GI intolerance  . Macrobid [employer] Macro] Other (See Comments)    Pt does not remember the reaction  . Metformin And Related Nausea And Vomiting  . Nsaids     Elevated creatinine   . Penicillins Hives and Swelling    Has patient had a PCN reaction causing immediate rash, facial/tongue/throat swelling, SOB or lightheadedness with hypotension: Yes Has patient had a PCN reaction causing severe rash involving mucus membranes or skin necrosis: No Has patient had a PCN reaction that required hospitalization No Has patient had a PCN reaction occurring within the last 10 years: No If all of the above answers are "NO", then may proceed with Cephalosporin use.  . Tolmetin Other (See Comments)    Elevated creatinine   . Shellfish Allergy Nausea And Vomiting, Rash and Other (See Comments)    Felt spaced out    Objective: Tammie West is a pleasant 86 y.o. Caucasian female, WD, WN in NAD. AAO x 3.  There were no vitals filed for this visit. Vascular Examination: Neurovascular status unchanged b/l lower extremities. Capillary fill time to digits <3 seconds b/l lower extremities. Faintly palpable pedal pulses b/l. Pedal hair absent. Lower extremity skin temperature gradient within normal limits. Trace edema noted b/l lower extremities.  Dermatological Examination: Pedal skin is thin  shiny, atrophic b/l lower extremities. No interdigital macerations bilaterally. Toenails 1-5 b/l elongated, discolored, dystrophic, thickened, crumbly with subungual debris and tenderness to dorsal palpation. Porokeratotic lesion(s) L 5th toe and R 5th toe. No erythema, no edema, no drainage, no flocculence. Left 5th toe digit partial thickness 1.0 x 1.0 x 0.1 cm with tenderness to palpation and subdermal hemorrhage. No ischemia, no gangrene.  Musculoskeletal: Normal muscle strength 5/5 to all lower extremity muscle groups bilaterally. No pain crepitus or joint limitation noted with ROM b/l. Adductovarus deformity b/l  5th digits.  Neurological Examination: Protective sensation diminished with 10g monofilament b/l. Vibratory sensation diminished b/l.  Assessment: 1. Pre-ulcerative corn or callous   2. Diabetes mellitus type 2 with peripheral artery disease ([place])     Plan: -Examined patient. -Partial thickness ulcer pared right 5th digit. Antibiotic ointment applied. Continue digital toe cap for daily protection. -Dispensed spot band-aid for daily use for protection.Marland KitchenApply every morning. Remove every evening. -To prevent wound formation, she needs to be seen monthly to prevent wound formation. She has poor healing potential due to diabetes and PAD. -Patient to continue soft, supportive shoe gear daily. -Patient to report any pedal injuries to medical professional immediately. -She has healed left lateral malleolus ulcer with vascular intervention and outpatient wound care. -Patient/POA to call should there be question/concern in the interim.  Return in about 1 month (around 01/13/2020).  Marzetta Board, DPM

## 2019-12-19 ENCOUNTER — Encounter (HOSPITAL_COMMUNITY): Payer: Self-pay | Admitting: Cardiovascular Disease

## 2019-12-19 ENCOUNTER — Ambulatory Visit (HOSPITAL_COMMUNITY)
Admission: RE | Admit: 2019-12-19 | Discharge: 2019-12-19 | Disposition: A | Discharge status: Home / Self Care | Payer: Medicare HMO | Attending: Cardiovascular Disease | Admitting: Cardiovascular Disease

## 2019-12-19 ENCOUNTER — Other Ambulatory Visit: Payer: Self-pay

## 2019-12-19 ENCOUNTER — Encounter (HOSPITAL_COMMUNITY)
Admission: RE | Disposition: A | Discharge status: Home / Self Care | Payer: Self-pay | Source: Home / Self Care | Attending: Cardiovascular Disease

## 2019-12-19 DIAGNOSIS — Z881 Allergy status to other antibiotic agents status: Secondary | ICD-10-CM | POA: Diagnosis not present

## 2019-12-19 DIAGNOSIS — F17210 Nicotine dependence, cigarettes, uncomplicated: Secondary | ICD-10-CM | POA: Diagnosis not present

## 2019-12-19 DIAGNOSIS — Z79899 Other long term (current) drug therapy: Secondary | ICD-10-CM | POA: Diagnosis not present

## 2019-12-19 DIAGNOSIS — I2510 Atherosclerotic heart disease of native coronary artery without angina pectoris: Secondary | ICD-10-CM | POA: Diagnosis not present

## 2019-12-19 DIAGNOSIS — E785 Hyperlipidemia, unspecified: Secondary | ICD-10-CM | POA: Diagnosis not present

## 2019-12-19 DIAGNOSIS — I10 Essential (primary) hypertension: Secondary | ICD-10-CM | POA: Diagnosis not present

## 2019-12-19 DIAGNOSIS — Z880 Allergy status to penicillin: Secondary | ICD-10-CM | POA: Diagnosis not present

## 2019-12-19 DIAGNOSIS — Z7982 Long term (current) use of aspirin: Secondary | ICD-10-CM | POA: Insufficient documentation

## 2019-12-19 DIAGNOSIS — I708 Atherosclerosis of other arteries: Secondary | ICD-10-CM | POA: Diagnosis not present

## 2019-12-19 DIAGNOSIS — Z885 Allergy status to narcotic agent status: Secondary | ICD-10-CM | POA: Insufficient documentation

## 2019-12-19 DIAGNOSIS — E1151 Type 2 diabetes mellitus with diabetic peripheral angiopathy without gangrene: Secondary | ICD-10-CM | POA: Insufficient documentation

## 2019-12-19 DIAGNOSIS — I771 Stricture of artery: Secondary | ICD-10-CM | POA: Diagnosis present

## 2019-12-19 HISTORY — PX: UPPER EXTREMITY ANGIOGRAPHY: CATH118270

## 2019-12-19 LAB — GLUCOSE, CAPILLARY
Glucose-Capillary: 298 mg/dL — ABNORMAL HIGH (ref 70–99)
Glucose-Capillary: 315 mg/dL — ABNORMAL HIGH (ref 70–99)

## 2019-12-19 SURGERY — UPPER EXTREMITY ANGIOGRAPHY
Anesthesia: LOCAL

## 2019-12-19 MED ORDER — SODIUM CHLORIDE 0.9 % WEIGHT BASED INFUSION: 3.0000 mL/kg/h | INTRAVENOUS | Status: AC

## 2019-12-19 MED ORDER — MIDAZOLAM HCL 2 MG/2ML IJ SOLN
INTRAMUSCULAR | Status: DC | PRN
  Administered 2019-12-19: 1 mg via INTRAVENOUS

## 2019-12-19 MED ORDER — SODIUM CHLORIDE 0.9 % IV SOLN: 250.0000 mL | INTRAVENOUS | Status: DC | PRN

## 2019-12-19 MED ORDER — LIDOCAINE HCL (PF) 1 % IJ SOLN
INTRAMUSCULAR | Status: DC | PRN
  Administered 2019-12-19: 15 mL via SUBCUTANEOUS

## 2019-12-19 MED ORDER — IODIXANOL 320 MG/ML IV SOLN
INTRAVENOUS | Status: DC | PRN
  Administered 2019-12-19: 75 mL via INTRA_ARTERIAL

## 2019-12-19 MED ORDER — ASPIRIN EC 81 MG PO TBEC: 81.0000 mg | DELAYED_RELEASE_TABLET | Freq: Every day | ORAL | Status: DC

## 2019-12-19 MED ORDER — SODIUM CHLORIDE 0.9 % WEIGHT BASED INFUSION: 1.0000 mL/kg/h | INTRAVENOUS | Status: DC

## 2019-12-19 MED ORDER — FENTANYL CITRATE (PF) 100 MCG/2ML IJ SOLN
INTRAMUSCULAR | Status: AC
  Filled 2019-12-19: qty 2

## 2019-12-19 MED ORDER — HEPARIN (PORCINE) IN NACL 1000-0.9 UT/500ML-% IV SOLN
INTRAVENOUS | Status: DC | PRN
  Administered 2019-12-19 (×2): 500 mL

## 2019-12-19 MED ORDER — SODIUM CHLORIDE 0.9% FLUSH: 3.0000 mL | Freq: Two times a day (BID) | INTRAVENOUS | Status: DC

## 2019-12-19 MED ORDER — HYDRALAZINE HCL 20 MG/ML IJ SOLN
INTRAMUSCULAR | Status: AC
  Filled 2019-12-19: qty 1

## 2019-12-19 MED ORDER — ACETAMINOPHEN 325 MG PO TABS: 650.0000 mg | ORAL_TABLET | ORAL | Status: DC | PRN

## 2019-12-19 MED ORDER — HEPARIN (PORCINE) IN NACL 1000-0.9 UT/500ML-% IV SOLN
INTRAVENOUS | Status: AC
  Filled 2019-12-19: qty 1000

## 2019-12-19 MED ORDER — LIDOCAINE HCL (PF) 1 % IJ SOLN
INTRAMUSCULAR | Status: AC
  Filled 2019-12-19: qty 30

## 2019-12-19 MED ORDER — ONDANSETRON HCL 4 MG/2ML IJ SOLN: 4.0000 mg | Freq: Four times a day (QID) | INTRAMUSCULAR | Status: DC | PRN

## 2019-12-19 MED ORDER — ASPIRIN 81 MG PO CHEW: 81.0000 mg | CHEWABLE_TABLET | ORAL | Status: DC

## 2019-12-19 MED ORDER — MIDAZOLAM HCL 2 MG/2ML IJ SOLN
INTRAMUSCULAR | Status: AC
  Filled 2019-12-19: qty 2

## 2019-12-19 MED ORDER — FENTANYL CITRATE (PF) 100 MCG/2ML IJ SOLN
INTRAMUSCULAR | Status: DC | PRN
  Administered 2019-12-19: 25 ug via INTRAVENOUS

## 2019-12-19 MED ORDER — SODIUM CHLORIDE 0.9% FLUSH: 3.0000 mL | INTRAVENOUS | Status: DC | PRN

## 2019-12-19 MED ORDER — HYDRALAZINE HCL 20 MG/ML IJ SOLN
INTRAMUSCULAR | Status: DC | PRN
  Administered 2019-12-19: 10 mg via INTRAVENOUS

## 2019-12-19 MED ORDER — LABETALOL HCL 5 MG/ML IV SOLN: 10.0000 mg | INTRAVENOUS | Status: DC | PRN

## 2019-12-19 MED ORDER — SODIUM CHLORIDE 0.9 % IV SOLN: INTRAVENOUS | Status: DC

## 2019-12-19 MED ORDER — HYDRALAZINE HCL 20 MG/ML IJ SOLN: 5.0000 mg | INTRAMUSCULAR | Status: DC | PRN

## 2019-12-19 SURGICAL SUPPLY — 12 items
CATH ANGIO 5F PIGTAIL 100CM (CATHETERS) ×2 IMPLANT
CATH INFINITI JR4 5F (CATHETERS) ×2 IMPLANT
CLOSURE MYNX CONTROL 5F (Vascular Products) ×2 IMPLANT
KIT PV (KITS) ×2 IMPLANT
SHEATH PINNACLE 5F 10CM (SHEATH) ×2 IMPLANT
SHEATH PROBE COVER 6X72 (BAG) ×2 IMPLANT
STOPCOCK MORSE 400PSI 3WAY (MISCELLANEOUS) ×2 IMPLANT
SYR MEDRAD MARK 7 150ML (SYRINGE) ×2 IMPLANT
TRANSDUCER W/STOPCOCK (MISCELLANEOUS) ×2 IMPLANT
TRAY PV CATH (CUSTOM PROCEDURE TRAY) ×2 IMPLANT
TUBING CIL FLEX 10 FLL-RA (TUBING) ×2 IMPLANT
WIRE HITORQ VERSACORE ST 145CM (WIRE) ×2 IMPLANT

## 2019-12-19 NOTE — Consult Note (Signed)
Hospital Consult    Reason for Consult: Left subclavian artery subtotal occlusion Referring Physician: Dr. [name] MRN #:  [id number]  History of Present Illness: This is a 86 y.o. female with a history of left lower extremity wound underwent angiography last March with drug-eluting stent placement in her left SFA.  She has done very well since that time.  Upon following up with Dr. [name] she was noted to have multiple dizzy episodes and has a previous carotid duplex which demonstrated high-grade left subclavian artery stenosis.  She subsequently underwent CT angiogram of her neck and she was scheduled for angiography for which she presented today.  In questioning she does have some coolness to her left hand and also has multiple episodes of dizzy feeling like she is going to pass out.  This is being ongoing for at least 6 months.  She has never had stroke TIA or amaurosis.  She has a history of coronary artery disease with previous angiogram.  Other risk factors include diabetes, hyperlipidemia, hypertension.  Past Medical History:  Diagnosis Date  . Bilateral carpal tunnel syndrome 02/17/2017  . Carotid artery disease (HCC)    1-39% bilateral stenosis  . Carpal tunnel syndrome   . CKD (chronic kidney disease) stage 4, GFR 15-29 ml/min (HCC)   . Coronary artery disease 11/2014    cath with Prox RCA to Mid RCA lesion, 45% stenosed and Ost LAD to Prox LAD lesion, 30% stenosed byt recent cath and high calcium score by coronary CTA.  . Crohn's disease ([place])   . Depression   . Diabetes mellitus without complication ([place])   . GERD (gastroesophageal reflux disease)   . Goiter   . Hyperlipidemia   . Hypertension   . Hypothyroidism   . Insomnia   . Migraine   . Mitral regurgitation    mild to moderate by echo 04/2017  . Osteoarthritis   . Osteoporosis   . PVD (peripheral vascular disease) ([place])    -s/p stenting of the 99% left mid SFA - followed by Dr. [name]  . Trochanteric bursitis      Past Surgical History:  Procedure Laterality Date  . ABDOMINAL HYSTERECTOMY    . APPENDECTOMY    . CARDIAC CATHETERIZATION N/A 12/13/2014   Procedure: Left Heart Cath and Coronary Angiography;  Surgeon: Belva Crome, MD;  Location: [facility];  Service: Cardiovascular;  Laterality: N/A;  . LOWER EXTREMITY ANGIOGRAPHY Left 04/04/2019   Procedure: LOWER EXTREMITY ANGIOGRAPHY;  Surgeon: Lorretta Harp, MD;  Location: [facility];  Service: Cardiovascular;  Laterality: Left;  . PERIPHERAL VASCULAR INTERVENTION Left 04/04/2019   Procedure: PERIPHERAL VASCULAR INTERVENTION;  Surgeon: Lorretta Harp, MD;  Location: [facility];  Service: Cardiovascular;  Laterality: Left;    Allergies  Allergen Reactions  . Atorvastatin Nausea And Vomiting  . Byetta 10 Mcg Pen [Exenatide] Nausea And Vomiting  . Cefaclor Other (See Comments)    Pt does not remember the reaction  . Codeine Nausea And Vomiting       . Crestor [Rosuvastatin] Nausea And Vomiting  . Dexilant [Dexlansoprazole] Diarrhea  . Erythromycin Hives and Swelling  . Fosamax [Alendronate Sodium] Other (See Comments)    GI intolerance  . Macrobid [employer] Macro] Other (See Comments)    Pt does not remember the reaction  . Metformin And Related Nausea And Vomiting  . Nsaids     Elevated creatinine   . Penicillins Hives and Swelling    Has patient  had a PCN reaction causing immediate rash, facial/tongue/throat swelling, SOB or lightheadedness with hypotension: Yes Has patient had a PCN reaction causing severe rash involving mucus membranes or skin necrosis: No Has patient had a PCN reaction that required hospitalization No Has patient had a PCN reaction occurring within the last 10 years: No If all of the above answers are "NO", then may proceed with Cephalosporin use.  . Tolmetin Other (See Comments)    Elevated creatinine   . Shellfish Allergy Nausea And Vomiting, Rash and Other (See  Comments)    Felt spaced out    Prior to Admission medications   Medication Sig Start Date End Date Taking? Authorizing Provider  Accu-Chek FastClix Lancets MISC USE 1 LANCET TO CHECK BLOOD SUGAR 3 TIMES DAILY FOR TYPE 2 DIABETES MELLITUS ON INSULIN 05/24/18  Yes [provider]  ACCU-CHEK SMARTVIEW test strip USE TO CHECK BLOOD SUGAR THREE TIMES DAILY 03/17/18  Yes [provider]  allopurinol (ZYLOPRIM) 100 MG tablet Take 100 mg by mouth daily. 01/26/19  Yes [provider]  amitriptyline (ELAVIL) 25 MG tablet Take 25 mg by mouth at bedtime.   Yes [provider]  amLODipine (NORVASC) 5 MG tablet Take 5 mg by mouth daily. 01/09/17  Yes [provider]  aspirin EC 81 MG tablet Take 1 tablet (81 mg total) by mouth daily. 01/10/15  Yes Turner, Eber Hong, MD  clidinium-chlordiazePOXIDE (LIBRAX) 5-2.5 MG capsule Take 1 capsule by mouth 2 (two) times daily.   Yes [provider]  clopidogrel (PLAVIX) 75 MG tablet Take 1 tablet (75 mg total) by mouth daily. 11/23/19  Yes Turner, Eber Hong, MD  diphenhydrAMINE (BENADRYL) 50 MG capsule Take 50 mg by mouth once.   Yes [provider]  escitalopram (LEXAPRO) 20 MG tablet Take 20 mg by mouth daily. 04/23/19  Yes [provider]  EUTHYROX 88 MCG tablet Take 88 mcg by mouth daily before breakfast.  10/29/18  Yes [provider]  furosemide (LASIX) 40 MG tablet Take 20 mg by mouth daily.  06/06/19  Yes [provider]  lidocaine (XYLOCAINE) 5 % ointment Apply 1 application topically 4 (four) times daily as needed for mild pain.  06/29/18  Yes [provider]  mesalamine (LIALDA) 1.2 G EC tablet Take 2.4 g by mouth daily after breakfast.   Yes [provider]  metoprolol succinate (TOPROL-XL) 50 MG 24 hr tablet Take 50 mg by mouth at bedtime. 07/14/19  Yes [provider]  MORPHABOND ER 15 MG T12A Take 15 mg by mouth in the morning and at bedtime.  02/01/18   Yes [provider]  nitroGLYCERIN (NITROSTAT) 0.4 MG SL tablet Place 1 tablet (0.4 mg total) under the tongue every 5 (five) minutes as needed for chest pain. 11/15/19  Yes Lorretta Harp, MD  Oxycodone HCl 10 MG TABS Take 10 mg by mouth every 4 (four) hours as needed (Pain).  07/11/17  Yes [provider]  polyethylene glycol (MIRALAX / GLYCOLAX) packet Take 17 g by mouth daily as needed for mild constipation.   Yes [provider]  predniSONE (DELTASONE) 50 MG tablet 1 tablet 13 hours prior to procedure 1 tablet 7 hours prior to procedure 1 tablet 1 hour prior to procedure. Patient taking differently: Take 50 mg by mouth once. 50 mg 13 hours prior to procedure 50 mg   7 hours prior to procedure 50 mg 1 hour prior to procedure. 12/13/19  Yes Lorretta Harp, MD  Probiotic Product (ALIGN) 4 MG CAPS Take 1 capsule by mouth daily after breakfast.    Yes [provider]  simvastatin (ZOCOR) 10 MG tablet Take 10 mg by mouth at bedtime.  02/09/13  Yes [provider]    Social History   Socioeconomic History  . Marital status: Married    Spouse name: Not on file  . Number of children: Not on file  . Years of education: Not on file  . Highest education level: Not on file  Occupational History  . Not on file  Tobacco Use  . Smoking status: Current Some Day Smoker    Packs/day: 0.25    Types: Cigarettes  . Smokeless tobacco: Never Used  Substance and Sexual Activity  . Alcohol use: No  . Drug use: No  . Sexual activity: Not on file  Other Topics Concern  . Not on file  Social History Narrative  . Not on file   Social Determinants of Health   Financial Resource Strain:   . Difficulty of Paying Living Expenses: Not on file  Food Insecurity:   . Worried About Charity fundraiser in the Last Year: Not on file  . Ran Out of Food in the Last Year: Not on file  Transportation Needs:   . Lack of Transportation (Medical): Not on file  . Lack  of Transportation (Non-Medical): Not on file  Physical Activity:   . Days of Exercise per Week: Not on file  . Minutes of Exercise per Session: Not on file  Stress:   . Feeling of Stress : Not on file  Social Connections:   . Frequency of Communication with Friends and Family: Not on file  . Frequency of Social Gatherings with Friends and Family: Not on file  . Attends Religious Services: Not on file  . Active Member of Clubs or Organizations: Not on file  . Attends Archivist Meetings: Not on file  . Marital Status: Not on file  Intimate Partner Violence:   . Fear of Current or Ex-Partner: Not on file  . Emotionally Abused: Not on file  . Physically Abused: Not on file  . Sexually Abused: Not on file    Family History  Problem Relation Age of Onset  . Emphysema Father   . CAD Brother   . Heart disease Brother   . Heart attack Neg Hx   . Stroke Neg Hx     ROS:  Cardiovascular: []  chest pain/pressure []  palpitations []  SOB lying flat []  DOE []  pain in legs while walking []  pain in legs at rest []  pain in legs at night []  non-healing ulcers []  hx of DVT []  swelling in legs  Pulmonary: []  productive cough []  asthma/wheezing []  home O2  Neurologic: []  weakness in []  arms []  legs []  numbness in []  arms []  legs []  hx of CVA []  mini stroke [] difficulty speaking or slurred speech []  temporary loss of vision in one eye [x]  dizziness  Hematologic: []  hx of cancer []  bleeding problems []  problems with blood clotting easily  Endocrine:   []  diabetes []  thyroid disease  GI []  vomiting blood []  blood in stool  GU: []  CKD/renal failure []  HD--[]  M/W/F or []  T/T/S []  burning with urination []  blood in urine  Psychiatric: []  anxiety []  depression  Musculoskeletal: []  arthritis []  joint pain  Integumentary: []  rashes []  ulcers  Constitutional: []  fever []  chills   Physical Examination  Vitals:   12/19/19 0955 12/19/19 1045  BP: (!)  148/63 (!) 136/59  Pulse: 90 86  Resp: 16 16  Temp:    SpO2: 100% 95%   Body mass index is 17.75 kg/m.  General:  nad HENT: WNL, normocephalic Pulmonary: normal non-labored breathing Cardiac: 1+ left brachial and radial artery pulse Abdomen: soft, NT/ND, no masses Musculoskeletal: no muscle wasting or atrophy  Neurologic: A&O X 3; Appropriate Affect ; SENSATION: normal; MOTOR FUNCTION:  moving all extremities equally. Speech is fluent/normal   CBC    Component Value Date/Time   WBC 7.6 12/13/2019 1437   WBC 8.4 01/07/2019 1136   RBC 3.48 (L) 12/13/2019 1437   RBC 3.55 (L) 01/07/2019 1136   HGB 11.1 12/13/2019 1437   HCT 34.0 12/13/2019 1437   PLT 383 12/13/2019 1437   MCV 98 (H) 12/13/2019 1437   MCH 31.9 12/13/2019 1437   MCH 29.6 01/07/2019 1136   MCHC 32.6 12/13/2019 1437   MCHC 31.8 (L) 01/07/2019 1136   RDW 11.7 12/13/2019 1437   LYMPHSABS 1,722 01/07/2019 1136   MONOABS 1.3 (H) 06/10/2015 1603   EOSABS 302 01/07/2019 1136   BASOSABS 84 01/07/2019 1136    BMET    Component Value Date/Time   NA 140 12/13/2019 1437   K 4.9 12/13/2019 1437   CL 104 12/13/2019 1437   CO2 23 12/13/2019 1437   GLUCOSE 172 (H) 12/13/2019 1437   GLUCOSE 190 (H) 01/07/2019 1136   BUN 20 12/13/2019 1437   CREATININE 0.96 12/13/2019 1437   CREATININE 1.74 (H) 01/07/2019 1136   CALCIUM 9.9 12/13/2019 1437   GFRNONAA 54 (L) 12/13/2019 1437   GFRNONAA 26 (L) 01/07/2019 1136   GFRAA 62 12/13/2019 1437   GFRAA 31 (L) 01/07/2019 1136    COAGS: Lab Results  Component Value Date   INR 1.04 12/13/2014   INR 0.96 12/11/2014     Non-Invasive Vascular Imaging:   I reviewed her angiography today with Dr. Alvester Chou   CTA IMPRESSION: Calcified and noncalcified atherosclerotic changes of the proximal left subclavian artery as detailed above with areas of ostial and proximal subclavian stenosis. See above comment. Left subclavian artery remains patent without acute occlusion or  thrombus. Left vertebral artery is widely patent.  Thoracic aortic atherosclerosis without acute vascular finding, aneurysm or dissection  Native coronary atherosclerosis  Cardiomegaly without CHF  Dependent left lower lobe rounded atelectasis versus rounded consolidation and small adjacent pleural effusion. Difficult to exclude left basilar pneumonia..  Carotid Duplex Right Carotid Findings:  +----------+--------+--------+--------+------------------+--------+       PSV cm/sEDV cm/sStenosisPlaque DescriptionComments  +----------+--------+--------+--------+------------------+--------+  CCA Prox 75   11                      +----------+--------+--------+--------+------------------+--------+  CCA Distal70   11                      +----------+--------+--------+--------+------------------+--------+  ICA Prox 80   15       heterogenous         +----------+--------+--------+--------+------------------+--------+  ICA Mid  105   23   1-39%                 +----------+--------+--------+--------+------------------+--------+  ICA Distal112   27                      +----------+--------+--------+--------+------------------+--------+  ECA    117   11                      +----------+--------+--------+--------+------------------+--------+   +----------+--------+-------+----------------+-------------------+  PSV cm/sEDV cmsDescribe    Arm Pressure (mmHG)  +----------+--------+-------+----------------+-------------------+  [id number]       Multiphasic, [id number]          +----------+--------+-------+----------------+-------------------+   +---------+--------+--+--------+--+---------+  VertebralPSV cm/s90EDV cm/s14Antegrade    +---------+--------+--+--------+--+---------+      Left Carotid Findings:  +----------+--------+--------+--------+------------------+---------+       PSV cm/sEDV cm/sStenosisPlaque DescriptionComments   +----------+--------+--------+--------+------------------+---------+  CCA Prox 72   12                       +----------+--------+--------+--------+------------------+---------+  CCA Mid  86   15   <50%  heterogenous          +----------+--------+--------+--------+------------------+---------+  CCA Distal98   16                       +----------+--------+--------+--------+------------------+---------+  ICA Prox 103   26       heterogenous          +----------+--------+--------+--------+------------------+---------+  ICA Mid  145   27   1-39%           turbulent  +----------+--------+--------+--------+------------------+---------+  ICA Distal107   28                       +----------+--------+--------+--------+------------------+---------+  ECA    95   7                        +----------+--------+--------+--------+------------------+---------+   +----------+--------+--------+-------------------+-------------------+       PSV cm/sEDV cm/sDescribe      Arm Pressure (mmHG)  +----------+--------+--------+-------------------+-------------------+  [id number]       Stenotic, turbulent122          +----------+--------+--------+-------------------+-------------------+   +---------+--------+--+--------+--+---------------------+  VertebralPSV NL/G92JJH cm/s15Systolic deceleration  +---------+--------+--+--------+--+---------------------+   Left vertebral artery demonstrates abnormal, antegrade flow with early  systolic deceleration.       Summary:  Right Carotid: Velocities in the right ICA are consistent with a 1-39%  stenosis.   Left Carotid: Velocities in the left ICA are consistent with a 1-39%  stenosis.        Non-hemodynamically significant plaque <50% noted in the  CCA.   Vertebrals: Right vertebral artery demonstrates antegrade flow.        Abnormal antegrade flow with early systolic deceleration in  the        left vertebral artery.  Subclavians: Left subclavian artery was stenotic. Left subclavian artery  flow        was disturbed. Normal flow hemodynamics were seen in the  right        subclavian artery.   ASSESSMENT/PLAN: This is a 86 y.o. female with history of peripheral arterial disease status post stenting she has done very well from this.  She now has notable subclavian artery stenosis that appears to be symptomatic with multiple dizzy episodes and reversal of flow late in her vertebral artery.  Given the location she is not a candidate for stenting.  I discussed with her her options being continue medical management versus bypass.  At this time patient wishes to undergo bypass from her left common carotid artery to her left subclavian artery.  We have discussed the risk and benefits we will get her scheduled very soon after the holiday.  Morgane Joerger C. [name], MD [facility] Office: [phone] Pager: [phone]

## 2019-12-19 NOTE — Interval H&P Note (Signed)
History and Physical Interval Note:  12/19/2019 9:18 AM  Tammie West  has presented today for surgery, with the diagnosis of sub artery disease.  The various methods of treatment have been discussed with the patient and family. After consideration of risks, benefits and other options for treatment, the patient has consented to  Procedure(s): UPPER EXTREMITY ANGIOGRAPHY (N/A) as a surgical intervention.  The patient's history has been reviewed, patient examined, no change in status, stable for surgery.  I have reviewed the patient's chart and labs.  Questions were answered to the patient's satisfaction.     Quay Burow

## 2019-12-19 NOTE — Progress Notes (Signed)
Discharge instructions reviewed with patient and family. Verbalized understanding. Dr. [name] came to bedside to consult with patient and daughter. Will follow up with him outpatient to reschedule procedure.

## 2019-12-19 NOTE — Discharge Instructions (Signed)
Femoral Site Care This sheet gives you information about how to care for yourself after your procedure. Your health care provider may also give you more specific instructions. If you have problems or questions, contact your health care provider. What can I expect after the procedure? After the procedure, it is common to have:  Bruising that usually fades within 1-2 weeks.  Tenderness at the site. Follow these instructions at home: Wound care  Follow instructions from your health care provider about how to take care of your insertion site. Make sure you: ? Wash your hands with soap and water before you change your bandage (dressing). If soap and water are not available, use hand sanitizer. ? Change your dressing as told by your health care provider. ? Leave stitches (sutures), skin glue, or adhesive strips in place. These skin closures may need to stay in place for 2 weeks or longer. If adhesive strip edges start to loosen and curl up, you may trim the loose edges. Do not remove adhesive strips completely unless your health care provider tells you to do that.  Do not take baths, swim, or use a hot tub until your health care provider approves.  You may shower 24-48 hours after the procedure or as told by your health care provider. ? Gently wash the site with plain soap and water. ? Pat the area dry with a clean towel. ? Do not rub the site. This may cause bleeding.  Do not apply powder or lotion to the site. Keep the site clean and dry.  Check your femoral site every day for signs of infection. Check for: ? Redness, swelling, or pain. ? Fluid or blood. ? Warmth. ? Pus or a bad smell. Activity  For the first 2-3 days after your procedure, or as long as directed: ? Avoid climbing stairs as much as possible. ? Do not squat.  Do not lift anything that is heavier than 10 lb (4.5 kg), or the limit that you are told, until your health care provider says that it is safe.  Rest as  directed. ? Avoid sitting for a long time without moving. Get up to take short walks every 1-2 hours.  Do not drive for 24 hours if you were given a medicine to help you relax (sedative). General instructions  Take over-the-counter and prescription medicines only as told by your health care provider.  Keep all follow-up visits as told by your health care provider. This is important. Contact a health care provider if you have:  A fever or chills.  You have redness, swelling, or pain around your insertion site. Get help right away if:  The catheter insertion area swells very fast.  You pass out.  You suddenly start to sweat or your skin gets clammy.  The catheter insertion area is bleeding, and the bleeding does not stop when you hold steady pressure on the area.  The area near or just beyond the catheter insertion site becomes pale, cool, tingly, or numb. These symptoms may represent a serious problem that is an emergency. Do not wait to see if the symptoms will go away. Get medical help right away. Call your local emergency services (911 in the [place] Do not drive yourself to the hospital. Summary  After the procedure, it is common to have bruising that usually fades within 1-2 weeks.  Check your femoral site every day for signs of infection.  Do not lift anything that is heavier than 10 lb (4.5 kg), or the   limit that you are told, until your health care provider says that it is safe. This information is not intended to replace advice given to you by your health care provider. Make sure you discuss any questions you have with your health care provider. Document Revised: 01/26/2017 Document Reviewed: 01/26/2017 Elsevier Patient Education  2020 Elsevier Inc.  

## 2019-12-20 ENCOUNTER — Other Ambulatory Visit: Payer: Self-pay

## 2019-12-26 ENCOUNTER — Telehealth: Payer: Self-pay | Admitting: Cardiovascular Disease

## 2019-12-26 NOTE — Telephone Encounter (Signed)
PV angio 11/22 Surgery scheduled 12/16 with Dr. Murvin Donning tomorrow with Dr. Gwendolyn Fill to daughter, advised to keep follow up with Dr. [name] tomorrow but carotid US cancelled.

## 2019-12-26 NOTE — Telephone Encounter (Signed)
New Message  Tammie West is calling and is wanting to know if the Carotid study is still needed for 12/01 because the pt is now scheduled with a surgeon  Please advise

## 2019-12-27 ENCOUNTER — Encounter: Payer: Self-pay | Admitting: Cardiovascular Disease

## 2019-12-27 ENCOUNTER — Other Ambulatory Visit: Payer: Self-pay

## 2019-12-27 ENCOUNTER — Ambulatory Visit (INDEPENDENT_AMBULATORY_CARE_PROVIDER_SITE_OTHER): Payer: Medicare HMO | Admitting: Cardiovascular Disease

## 2019-12-27 DIAGNOSIS — I771 Stricture of artery: Secondary | ICD-10-CM

## 2019-12-27 NOTE — Assessment & Plan Note (Signed)
Tammie West returns today for follow-up of her recent left subclavian angiogram which I performed 12/19/2019.  This was done because of symptoms of left upper extremity claudication and subclavian steal.  She had a 95% stenosis just proximal to the takeoff of a large left vertebral artery making PTA and stenting untenable because of risk of posterior circulation stroke.  I did review the angiogram with Dr. [name], vascular surgeon, who is agreed to perform left common carotid to subclavian bypass on 01/12/2020.  I believe she would be low risk for this.  She did have cardiac catheterization performed by Dr. Tamala Julian 12/13/2014 which revealed minimal CAD.  She otherwise is asymptomatic.

## 2019-12-27 NOTE — Patient Instructions (Signed)
Medication Instructions:  Your physician recommends that you continue on your current medications as directed. Please refer to the Current Medication list given to you today.  *If you need a refill on your cardiac medications before your next appointment, please call your pharmacy*  Follow-Up: At [facility], you and your health needs are our priority.  As part of our continuing mission to provide you with exceptional heart care, we have created designated Provider Care Teams.  These Care Teams include your primary Cardiologist (physician) and Advanced Practice Providers (APPs -  Physician Assistants and Nurse Practitioners) who all work together to provide you with the care you need, when you need it.  We recommend signing up for the patient portal called "MyChart".  Sign up information is provided on this After Visit Summary.  MyChart is used to connect with patients for Virtual Visits (Telemedicine).  Patients are able to view lab/test results, encounter notes, upcoming appointments, etc.  Non-urgent messages can be sent to your provider as well.   To learn more about what you can do with MyChart, go to [email or web address].    Your next appointment:   3 month(s)  The format for your next appointment:   In Person  Provider:   Quay Burow, MD   Other Instructions Referred to Dr. [name].

## 2019-12-27 NOTE — Progress Notes (Signed)
12/27/2019 Tammie West   19-Jul-1932  [id number]  Primary Physician [name], MD Primary Cardiologist: Lorretta Harp MD Tammie West, [place]  HPI:  Tammie West is a 86 y.o.  thin appearing married Caucasian female mother of 44 children, grandmother of 3 grandchildren who is accompanied by one of her daughters Tammie West.She was referred to me by Dr. Jacqualyn Posey, her podiatrist for nonhealing ulcer on her left lateral malleolus.I last saw her in the office  12/13/2019. She has a history of treated hypertension, diabetes and hyperlipidemia. She does continue to smoke. She has noncritical CAD by cath. She has diastolic dysfunction as well. She has complained of left calf claudication. She developed a wound on her left lateral malleolus which has been getting worse despite aggressive local wound care with recent Dopplers that showed a high-frequency signal in her mid left SFA.  I performed peripheral angiography on her 04/04/2019 revealing a 99% mid left SFA stenosis with three-vessel runoff. I performed PTA and drug-eluting stenting with an Eluviadrug-eluting stent (6 mm x 40 mm). She had excellent angiographic result. Her Doppler studies normalized on 04/19/2019, her claudication resolved and her malleolar woundultimately healed.  Since I saw her in the office 6 months ago her wound is healed. She denies claudication. She was complaining of some dizzy episodes and had Doppler studies performed 05/18/2019 revealing high-grade left subclavian artery stenosis with disturbed left vertebral blood flow.  I ordered a CT angiogram on 12/02/2019 which confirmed high-grade left subclavian artery stenosis.    I performed subclavian angiography on her 12/19/2019 revealing a 95% stenosis just proximal to the takeoff of a large left vertebral artery making endovascular therapy high risk.  I did review the angiogram with Dr. [name], vascular surgeon, who has agreed  to perform left common carotid to left subclavian bypass on 01/12/2020.  I believe the patient is at low risk given her prior cardiac catheterization by Dr. Tamala Julian 12/13/2014 revealing minimal CAD.  She is otherwise asymptomatic.   No outpatient medications have been marked as taking for the 12/27/19 encounter (Office Visit) with Lorretta Harp, MD.   Current Facility-Administered Medications for the 12/27/19 encounter (Office Visit) with Lorretta Harp, MD  Medication   sodium chloride flush (NS) 0.9 % injection 3 mL     Allergies  Allergen Reactions   Atorvastatin Nausea And Vomiting   Byetta 10 Mcg Pen [Exenatide] Nausea And Vomiting   Cefaclor Other (See Comments)    Pt does not remember the reaction   Codeine Nausea And Vomiting        Crestor [Rosuvastatin] Nausea And Vomiting   Dexilant [Dexlansoprazole] Diarrhea   Erythromycin Hives and Swelling   Fosamax [Alendronate Sodium] Other (See Comments)    GI intolerance   Macrobid [Nitrofurantoin Monohyd Macro] Other (See Comments)    Pt does not remember the reaction   Metformin And Related Nausea And Vomiting   Nsaids     Elevated creatinine    Penicillins Hives and Swelling    Has patient had a PCN reaction causing immediate rash, facial/tongue/throat swelling, SOB or lightheadedness with hypotension: Yes Has patient had a PCN reaction causing severe rash involving mucus membranes or skin necrosis: No Has patient had a PCN reaction that required hospitalization No Has patient had a PCN reaction occurring within the last 10 years: No If all of the above answers are "NO", then may proceed with Cephalosporin use.   Tolmetin Other (See Comments)    Elevated  creatinine    Shellfish Allergy Nausea And Vomiting, Rash and Other (See Comments)    Felt spaced out    Social History   Socioeconomic History   Marital status: Married    Spouse name: Not on file   Number of children: Not on file   Years of  education: Not on file   Highest education level: Not on file  Occupational History   Not on file  Tobacco Use   Smoking status: Current Some Day Smoker    Packs/day: 0.25    Types: Cigarettes   Smokeless tobacco: Never Used  Substance and Sexual Activity   Alcohol use: No   Drug use: No   Sexual activity: Not on file  Other Topics Concern   Not on file  Social History Narrative   Not on file   Social Determinants of Health   Financial Resource Strain:    Difficulty of Paying Living Expenses: Not on file  Food Insecurity:    Worried About [place] in the Last Year: Not on file   Ran Out of Food in the Last Year: Not on file  Transportation Needs:    Lack of Transportation (Medical): Not on file   Lack of Transportation (Non-Medical): Not on file  Physical Activity:    Days of Exercise per Week: Not on file   Minutes of Exercise per Session: Not on file  Stress:    Feeling of Stress : Not on file  Social Connections:    Frequency of Communication with Friends and Family: Not on file   Frequency of Social Gatherings with Friends and Family: Not on file   Attends Religious Services: Not on file   Active Member of Clubs or Organizations: Not on file   Attends Archivist Meetings: Not on file   Marital Status: Not on file  Intimate Partner Violence:    Fear of Current or Ex-Partner: Not on file   Emotionally Abused: Not on file   Physically Abused: Not on file   Sexually Abused: Not on file     Review of Systems: General: negative for chills, fever, night sweats or weight changes.  Cardiovascular: negative for chest pain, dyspnea on exertion, edema, orthopnea, palpitations, paroxysmal nocturnal dyspnea or shortness of breath Dermatological: negative for rash Respiratory: negative for cough or wheezing Urologic: negative for hematuria Abdominal: negative for nausea, vomiting, diarrhea, bright red blood per rectum, melena,  or hematemesis Neurologic: negative for visual changes, syncope, or dizziness All other systems reviewed and are otherwise negative except as noted above.    Blood pressure 128/60, pulse 66, height 5\' 6"  (1.676 m), weight 108 lb (49 kg).  General appearance: alert and no distress Neck: no adenopathy, no JVD, supple, symmetrical, trachea midline, thyroid not enlarged, symmetric, no tenderness/mass/nodules and Left subclavian bruit Lungs: clear to auscultation bilaterally Heart: regular rate and rhythm, S1, S2 normal, no murmur, click, rub or gallop Extremities: extremities normal, atraumatic, no cyanosis or edema Pulses: 2+ and symmetric Skin: Skin color, texture, turgor normal. No rashes or lesions Neurologic: Alert and oriented X 3, normal strength and tone. Normal symmetric reflexes. Normal coordination and gait  EKG not performed today  ASSESSMENT AND PLAN:   Stenosis of left subclavian artery ([place]) Ms. Struve returns today for follow-up of her recent left subclavian angiogram which I performed 12/19/2019.  This was done because of symptoms of left upper extremity claudication and subclavian steal.  She had a 95% stenosis just proximal to the  takeoff of a large left vertebral artery making PTA and stenting untenable because of risk of posterior circulation stroke.  I did review the angiogram with Dr. [name], vascular surgeon, who is agreed to perform left common carotid to subclavian bypass on 01/12/2020.  I believe she would be low risk for this.  She did have cardiac catheterization performed by Dr. Tamala Julian 12/13/2014 which revealed minimal CAD.  She otherwise is asymptomatic.      Lorretta Harp MD FACP,FACC,FAHA, [facility] 12/27/2019 9:45 AM

## 2019-12-28 ENCOUNTER — Ambulatory Visit (HOSPITAL_COMMUNITY): Payer: Medicare HMO

## 2020-01-05 ENCOUNTER — Encounter (HOSPITAL_COMMUNITY): Payer: Self-pay

## 2020-01-05 ENCOUNTER — Other Ambulatory Visit: Payer: Self-pay

## 2020-01-05 ENCOUNTER — Encounter (HOSPITAL_BASED_OUTPATIENT_CLINIC_OR_DEPARTMENT_OTHER): Payer: Medicare HMO | Admitting: Internal Medicine

## 2020-01-05 ENCOUNTER — Encounter (HOSPITAL_COMMUNITY)
Admission: RE | Admit: 2020-01-05 | Discharge: 2020-01-05 | Disposition: A | Discharge status: Home / Self Care | Payer: Medicare HMO | Source: Ambulatory Visit | Attending: Vascular Surgery | Admitting: Vascular Surgery

## 2020-01-05 DIAGNOSIS — Z01812 Encounter for preprocedural laboratory examination: Secondary | ICD-10-CM | POA: Diagnosis not present

## 2020-01-05 LAB — URINALYSIS, ROUTINE W REFLEX MICROSCOPIC
Bacteria, UA: NONE SEEN
Bilirubin Urine: NEGATIVE
Glucose, UA: 50 mg/dL — AB
Ketones, ur: NEGATIVE mg/dL
Leukocytes,Ua: NEGATIVE
Nitrite: NEGATIVE
Protein, ur: 100 mg/dL — AB
Specific Gravity, Urine: 1.014 (ref 1.005–1.030)
pH: 5 (ref 5.0–8.0)

## 2020-01-05 LAB — CBC
HCT: 38.2 % (ref 36.0–46.0)
Hemoglobin: 11.8 g/dL — ABNORMAL LOW (ref 12.0–15.0)
MCH: 31.3 pg (ref 26.0–34.0)
MCHC: 30.9 g/dL (ref 30.0–36.0)
MCV: 101.3 fL — ABNORMAL HIGH (ref 80.0–100.0)
Platelets: 369 10*3/uL (ref 150–400)
RBC: 3.77 MIL/uL — ABNORMAL LOW (ref 3.87–5.11)
RDW: 13.2 % (ref 11.5–15.5)
WBC: 7.4 10*3/uL (ref 4.0–10.5)
nRBC: 0 % (ref 0.0–0.2)

## 2020-01-05 LAB — HEMOGLOBIN A1C
Hgb A1c MFr Bld: 7.5 % — ABNORMAL HIGH (ref 4.8–5.6)
Mean Plasma Glucose: 168.55 mg/dL

## 2020-01-05 LAB — COMPREHENSIVE METABOLIC PANEL
ALT: 16 U/L (ref 0–44)
AST: 17 U/L (ref 15–41)
Albumin: 3.5 g/dL (ref 3.5–5.0)
Alkaline Phosphatase: 106 U/L (ref 38–126)
Anion gap: 10 (ref 5–15)
BUN: 16 mg/dL (ref 8–23)
CO2: 27 mmol/L (ref 22–32)
Calcium: 9.7 mg/dL (ref 8.9–10.3)
Chloride: 103 mmol/L (ref 98–111)
Creatinine, Ser: 0.82 mg/dL (ref 0.44–1.00)
GFR, Estimated: >60 mL/min (ref >60)
Glucose, Bld: 159 mg/dL — ABNORMAL HIGH (ref 70–99)
Potassium: 4 mmol/L (ref 3.5–5.1)
Sodium: 140 mmol/L (ref 135–145)
Total Bilirubin: 0.6 mg/dL (ref 0.3–1.2)
Total Protein: 6.8 g/dL (ref 6.5–8.1)

## 2020-01-05 LAB — SURGICAL PCR SCREEN
MRSA, PCR: NEGATIVE
Staphylococcus aureus: NEGATIVE

## 2020-01-05 LAB — GLUCOSE, CAPILLARY: Glucose-Capillary: 215 mg/dL — ABNORMAL HIGH (ref 70–99)

## 2020-01-05 LAB — PROTIME-INR
INR: 1 (ref 0.8–1.2)
Prothrombin Time: 12.7 seconds (ref 11.4–15.2)

## 2020-01-05 LAB — APTT: aPTT: 29 seconds (ref 24–36)

## 2020-01-05 NOTE — Progress Notes (Addendum)
[facility] - [place] - [address] [address] [place] [address] Phone: [phone] Fax: [phone]      Your procedure is scheduled on January 12, 2020.  Report to [facility] Main Entrance "A" at 09:00 A.M., and check in at the Admitting office.  Call this number if you have problems the morning of surgery:  [phone]  Call [phone] if you have any questions prior to your surgery date Monday-Friday 8am-4pm    Remember:  Do not eat or drink after midnight the night before your surgery    Take these medicines the morning of surgery with A SIP OF WATER : Allopurinol (Zyloprim) Amlodipine (Norvasc) Aspirin Clidinium-Chlordiazepoxide (Librax) Escitalopram (Lexapro) Euthyrox Mesalamine (Lialda) Metoprolol Succinate (Toprol-XL) Morphabond ER Omeprazole (Prilosec)  If NEEDED: Oxycodone Nitroglycerin (Nitrostat)  Per your Doctor, STOP Clopidogrel (PLAVIX) on December 10th.  As of today, STOP taking any Aleve, Naproxen, Ibuprofen, Motrin, Advil, Goody's, BC's, all herbal medications, fish oil, and all vitamins.   HOW TO MANAGE YOUR DIABETES BEFORE AND AFTER SURGERY  Why is it important to control my blood sugar before and after surgery? . Improving blood sugar levels before and after surgery helps healing and can limit problems. . A way of improving blood sugar control is eating a healthy diet by: o  Eating less sugar and carbohydrates o  Increasing activity/exercise o  Talking with your doctor about reaching your blood sugar goals . High blood sugars (greater than 180 mg/dL) can raise your risk of infections and slow your recovery, so you will need to focus on controlling your diabetes during the weeks before surgery. . Make sure that the doctor who takes care of your diabetes knows about your planned surgery including the date and location.  How do I manage my blood sugar before surgery? . Check  your blood sugar at least 4 times a day, starting 2 days before surgery, to make sure that the level is not too high or low. . Check your blood sugar the morning of your surgery when you wake up and every 2 hours until you get to the Short Stay unit. o If your blood sugar is less than 70 mg/dL, you will need to treat for low blood sugar: - Do not take insulin. - Treat a low blood sugar (less than 70 mg/dL) with  cup of clear juice (cranberry or apple), 4 glucose tablets, OR glucose gel. - Recheck blood sugar in 15 minutes after treatment (to make sure it is greater than 70 mg/dL). If your blood sugar is not greater than 70 mg/dL on recheck, call [phone] for further instructions. . Report your blood sugar to the short stay nurse when you get to Short Stay.  . If you are admitted to the hospital after surgery: o Your blood sugar will be checked by the staff and you will probably be given insulin after surgery (instead of oral diabetes medicines) to make sure you have good blood sugar levels. o The goal for blood sugar control after surgery is 80-180 mg/dL.                      Do not wear jewelry, make up, or nail polish            Do not wear lotions, powders, perfumes, or deodorant.            Do not shave 48 hours prior to surgery.  Do not bring valuables to the hospital.            [facility] is not responsible for any belongings or valuables.  Do NOT Smoke (Tobacco/Vaping) or drink Alcohol 24 hours prior to your procedure If you use a CPAP at night, you may bring all equipment for your overnight stay.   Contacts, glasses, dentures or bridgework may not be worn into surgery.      For patients admitted to the hospital, discharge time will be determined by your treatment team.   Patients discharged the day of surgery will not be allowed to drive home, and someone needs to stay with them for 24 hours.    Special instructions:   [place]- Preparing For  Surgery  Before surgery, you can play an important role. Because skin is not sterile, your skin needs to be as free of germs as possible. You can reduce the number of germs on your skin by washing with CHG (chlorahexidine gluconate) Soap before surgery.  CHG is an antiseptic cleaner which kills germs and bonds with the skin to continue killing germs even after washing.    Oral Hygiene is also important to reduce your risk of infection.  Remember - BRUSH YOUR TEETH THE MORNING OF SURGERY WITH YOUR REGULAR TOOTHPASTE  Please do not use if you have an allergy to CHG or antibacterial soaps. If your skin becomes reddened/irritated stop using the CHG.  Do not shave (including legs and underarms) for at least 48 hours prior to first CHG shower. It is OK to shave your face.  Please follow these instructions carefully.   1. Shower the NIGHT BEFORE SURGERY and the MORNING OF SURGERY with CHG Soap.   2. If you chose to wash your hair, wash your hair first as usual with your normal shampoo.  3. After you shampoo, rinse your hair and body thoroughly to remove the shampoo.  4. Use CHG as you would any other liquid soap. You can apply CHG directly to the skin and wash gently with a scrungie or a clean washcloth.   5. Apply the CHG Soap to your body ONLY FROM THE NECK DOWN.  Do not use on open wounds or open sores. Avoid contact with your eyes, ears, mouth and genitals (private parts). Wash Face and genitals (private parts)  with your normal soap.   6. Wash thoroughly, paying special attention to the area where your surgery will be performed.  7. Thoroughly rinse your body with warm water from the neck down.  8. DO NOT shower/wash with your normal soap after using and rinsing off the CHG Soap.  9. Pat yourself dry with a CLEAN TOWEL.  10. Wear CLEAN PAJAMAS to bed the night before surgery  11. Place CLEAN SHEETS on your bed the night of your first shower and DO NOT SLEEP WITH PETS.   Day of  Surgery: Wear Clean/Comfortable clothing the morning of surgery Do not apply any deodorants/lotions.   Remember to brush your teeth WITH YOUR REGULAR TOOTHPASTE.   Please read over the following fact sheets that you were given.

## 2020-01-05 NOTE — Progress Notes (Signed)
PCP - Dr. [name] Cardiologist - Dr. Roderic Palau Berry/Dr. Fransico Him per patient  PPM/ICD - denies  Chest x-ray - N/A EKG - 11/15/2019 Stress Test - 09/15/14 ECHO - 05/26/2019 Cardiac Cath - 12/13/2014  Sleep Study - denies CPAP - N/A  Fasting Blood Sugar - 90 - 120 Checks Blood Sugar 1-2 times a day  Blood Thinner Instructions: PLAVIX: per patient last dose to be taken on 01/06/2020 Aspirin Instructions: continue taking as prescrbed  ERAS Protcol - No  COVID TEST- Scheduled for 01/10/2020. Patient verbalized understanding of self-quarantine instructions, appointment time and place.  Anesthesia review: YES, CAD hx  Patient denies shortness of breath, fever, cough and chest pain at PAT appointment  All instructions explained to the patient, with a verbal understanding of the material. Patient agrees to go over the instructions while at home for a better understanding. Patient also instructed to self quarantine after being tested for COVID-19. The opportunity to ask questions was provided.

## 2020-01-06 NOTE — Progress Notes (Signed)
Anesthesia Chart Review:  Case: [id number] Date/Time: 01/12/20 1045   Procedure: LEFT COMMON CAROTID-SUBCLAVIAN ARTERY BYPASS WITH GRAFT (Left )   Anesthesia type: General   Pre-op diagnosis: LEFT SUBCLAVIAN ARTERY STENOSIS   Location: [place] OR ROOM 16 / [place] [facility]   Surgeons: Waynetta Sandy, MD      DISCUSSION: Patient is an 86 year old female scheduled for the above procedure. She has had episodes of presyncope and Doppler studies in April 2021 revealed high grade left SCA stenosis with abnormal left vertebral blood flow.  She was placed on Plavix for this. Following 11/2019 arch aortogram, she was referred for this procedure by [facility] cardiologist Dr. [name], and on 12/27/19, he felt she would be "low risk" for this surgery.   Other history includes smoking, CAD (45% RCA, 30% LAD 2016), mitral regurgitation, carotid artery disease, PVD (s/p left SFA stent 04/04/19), HTN, HLD, DM2, Crohn's disease, hypothyroidism, goiter, GERD, migraines.  Per [facility], patient to continue aspirin, last Plavix 01/06/2020.  Presurgical COVID-19 test is scheduled for 01/10/2020.  Anesthesia team to evaluate on the day of surgery.   VS: BP (!) 155/59   Pulse 67   Temp 36.9 C (Oral)   Resp 17   Ht 5\' 6"  (1.676 m)   Wt 46.5 kg   SpO2 97%   BMI 16.56 kg/m   PROVIDERS: [name], MD is PCP Quay Burow, MD is PV cardiologist Fransico Him, MD is primary cardiologist   LABS: Labs reviewed: Acceptable for surgery. (all labs ordered are listed, but only abnormal results are displayed)  Labs Reviewed  CBC - Abnormal; Notable for the following components:      Result Value   RBC 3.77 (*)    Hemoglobin 11.8 (*)    MCV 101.3 (*)    All other components within normal limits  COMPREHENSIVE METABOLIC PANEL - Abnormal; Notable for the following components:   Glucose, Bld 159 (*)    All other components within normal limits  URINALYSIS, ROUTINE W REFLEX MICROSCOPIC - Abnormal; Notable for the  following components:   Glucose, UA 50 (*)    Hgb urine dipstick SMALL (*)    Protein, ur 100 (*)    All other components within normal limits  HEMOGLOBIN A1C - Abnormal; Notable for the following components:   Hgb A1c MFr Bld 7.5 (*)    All other components within normal limits  GLUCOSE, CAPILLARY - Abnormal; Notable for the following components:   Glucose-Capillary 215 (*)    All other components within normal limits  SURGICAL PCR SCREEN  PROTIME-INR  APTT  TYPE AND SCREEN    IMAGES: CTA Chest 12/02/19: IMPRESSION: - Calcified and noncalcified atherosclerotic changes of the proximal left subclavian artery as detailed above with areas of ostial and proximal subclavian stenosis. See above comment. Left subclavian artery remains patent without acute occlusion or thrombus. Left vertebral artery is widely patent. - Thoracic aortic atherosclerosis without acute vascular finding, aneurysm or dissection - Native coronary atherosclerosis - Cardiomegaly without CHF - Dependent left lower lobe rounded atelectasis versus rounded consolidation and small adjacent pleural effusion. Difficult to exclude left basilar pneumonia. - Aortic Atherosclerosis (ICD10-I70.0) and Emphysema (ICD10-J43.9).   EKG: 11/15/2019 ([facility]): Sinus rhythm with short PR with premature atrial complexes Left axis deviation Right bundle branch block   CV: Arch aortogram 12/19/19 Quay Burow, MD): Angiographic Data: 1: Arch aortogram-type II aortic arch 2: Right innominate/subclavian artery-widely patent 3: Left subclavian artery-95% stenosis just proximal to the takeoff of a large left vertebral  artery. IMPRESSION: Ms. Seguin has a high-grade stenosis in the left subclavian artery just proximal to the takeoff of the large left vert making it not suitable for percutaneous intervention.  She would be a good candidate for left common carotid to subclavian bypass.  I reviewed this with Dr. Servando Snare, vascular surgeon, who concurs.     Cardiac event monitor 05/05/19-06/03/19:  SInus bradycardia, normal sinus rhythm and sinus tachycardia. The average heart rate was 65bpm and ranged from 26 to 103bpm.  Junctional bradycardia during sleep as low as 26bpm.  Occasional PVCs and PACs, atrial couplets. - Results reviewed by cardiologist Fransico Him, MD, "Heart monitor showed NSR with average heart rate 65bpm. HR got transiently low at 26bpm during a sinus pause during sleep. There was some extra heart beats from the top and bottom of her heart which are benign. No sinus or junctional bradycardia or pauses during awake hours"   Echo 05/26/19: IMPRESSIONS  1. Left ventricular ejection fraction, by estimation, is 65 to 70%. The  left ventricle has normal function. The left ventricle has no regional  wall motion abnormalities. Left ventricular diastolic parameters are  consistent with Grade I diastolic  dysfunction (impaired relaxation).  2. Right ventricular systolic function is normal. The right ventricular  size is normal. There is normal pulmonary artery systolic pressure.  3. Left atrial size was mildly dilated.  4. The mitral valve is abnormal. Trivial mitral valve regurgitation.  5. The aortic valve is tricuspid. Aortic valve regurgitation is not  visualized.  6. The inferior vena cava IVC measures <1.2 cm and spontaneously  collapses, suggesting volume depletion and a low RA pressure <3 mmHg.    Carotid US 05/18/19: Summary:  - Right Carotid: Velocities in the right ICA are consistent with a 1-39% stenosis.  - Left Carotid: Velocities in the left ICA are consistent with a 1-39% stenosis. Non-hemodynamically significant plaque <50% noted in the CCA.  - Vertebrals: Right vertebral artery demonstrates antegrade flow. Abnormal antegrade flow with early systolic deceleration in the left vertebral artery.  - Subclavians: Left subclavian artery was stenotic. Left subclavian  artery flow was disturbed. Normal flow hemodynamics were seen in the right subclavian artery.   Cardiac cath 12/13/14: 1. Prox RCA to Mid RCA lesion, 45% stenosed. 2. Ost LAD to Prox LAD lesion, 30% stenosed.   Minimal coronary atherosclerosis involving the proximal RCA and mid and proximal LAD. No significant obstructive disease is noted.  Normal left ventricular function.  Coronary calcification and high calcium score.  Recommendations:  No further ischemic evaluation for chest discomfort. Consider other etiology including gastroesophageal.    Past Medical History:  Diagnosis Date  . Bilateral carpal tunnel syndrome 02/17/2017  . Carotid artery disease (HCC)    1-39% bilateral stenosis  . Carpal tunnel syndrome   . CKD (chronic kidney disease) stage 4, GFR 15-29 ml/min (HCC)   . Coronary artery disease 11/2014    cath with Prox RCA to Mid RCA lesion, 45% stenosed and Ost LAD to Prox LAD lesion, 30% stenosed byt recent cath and high calcium score by coronary CTA.  . Crohn's disease ([place])   . Depression   . Diabetes mellitus without complication ([place])   . GERD (gastroesophageal reflux disease)   . Goiter   . Hyperlipidemia   . Hypertension   . Hypothyroidism   . Insomnia   . Migraine   . Mitral regurgitation    mild to moderate by echo 04/2017  . Osteoarthritis   .  Osteoporosis   . PVD (peripheral vascular disease) ([place])    -s/p stenting of the 99% left mid SFA - followed by Dr. [name]  . Trochanteric bursitis     Past Surgical History:  Procedure Laterality Date  . ABDOMINAL HYSTERECTOMY    . APPENDECTOMY    . CARDIAC CATHETERIZATION N/A 12/13/2014   Procedure: Left Heart Cath and Coronary Angiography;  Surgeon: Belva Crome, MD;  Location: [place] CV LAB;  Service: Cardiovascular;  Laterality: N/A;  . CATARACT EXTRACTION W/ INTRAOCULAR LENS  IMPLANT, BILATERAL Bilateral   . LOWER EXTREMITY ANGIOGRAPHY Left 04/04/2019   Procedure: LOWER EXTREMITY ANGIOGRAPHY;   Surgeon: Lorretta Harp, MD;  Location: [place] CV LAB;  Service: Cardiovascular;  Laterality: Left;  . PERIPHERAL VASCULAR INTERVENTION Left 04/04/2019   Procedure: PERIPHERAL VASCULAR INTERVENTION;  Surgeon: Lorretta Harp, MD;  Location: [place] CV LAB;  Service: Cardiovascular;  Laterality: Left;  . UPPER EXTREMITY ANGIOGRAPHY N/A 12/19/2019   Procedure: UPPER EXTREMITY ANGIOGRAPHY;  Surgeon: Lorretta Harp, MD;  Location: [place] CV LAB;  Service: Cardiovascular;  Laterality: N/A;    MEDICATIONS: . Accu-Chek FastClix Lancets MISC  . ACCU-CHEK SMARTVIEW test strip  . allopurinol (ZYLOPRIM) 100 MG tablet  . amitriptyline (ELAVIL) 25 MG tablet  . amLODipine (NORVASC) 5 MG tablet  . aspirin EC 81 MG tablet  . Cholecalciferol (VITAMIN D) 50 MCG (2000 UT) tablet  . clidinium-chlordiazePOXIDE (LIBRAX) 5-2.5 MG capsule  . clopidogrel (PLAVIX) 75 MG tablet  . escitalopram (LEXAPRO) 20 MG tablet  . EUTHYROX 88 MCG tablet  . furosemide (LASIX) 40 MG tablet  . lidocaine (XYLOCAINE) 5 % ointment  . mesalamine (LIALDA) 1.2 G EC tablet  . metoprolol succinate (TOPROL-XL) 50 MG 24 hr tablet  . MORPHABOND ER 15 MG T12A  . nitroGLYCERIN (NITROSTAT) 0.4 MG SL tablet  . omeprazole (PRILOSEC) 20 MG capsule  . Oxycodone HCl 10 MG TABS  . polyethylene glycol (MIRALAX / GLYCOLAX) packet  . predniSONE (DELTASONE) 50 MG tablet  . Probiotic Product (ALIGN) 4 MG CAPS  . simvastatin (ZOCOR) 10 MG tablet   . sodium chloride flush (NS) 0.9 % injection 3 mL  She is completed course of prednisone.   Myra Gianotti, PA-C Surgical Short Stay/Anesthesiology [facility] Phone [phone] [facility] Phone [phone] 01/06/2020 5:46 PM

## 2020-01-06 NOTE — Anesthesia Preprocedure Evaluation (Addendum)
Anesthesia Evaluation  Patient identified by MRN, date of birth, ID band Patient awake    Reviewed: Allergy & Precautions, NPO status , Patient's Chart, lab work & pertinent test results  History of Anesthesia Complications Negative for: history of anesthetic complications  Airway Mallampati: I  TM Distance: >3 FB Neck ROM: Full    Dental  (+) Edentulous Upper, Edentulous Lower, Dental Advisory Given   Pulmonary neg pulmonary ROS, Current Smoker and Patient abstained from smoking.,    Pulmonary exam normal        Cardiovascular hypertension, Pt. on medications and Pt. on home beta blockers + CAD  Normal cardiovascular exam  Arch aortogram 12/19/19 Quay Burow, MD): Angiographic Data: 1: Arch aortogram-type II aortic arch 2: Right innominate/subclavian artery-widely patent 3: Left subclavian artery-95% stenosis just proximal to the takeoff of a large left vertebral artery. IMPRESSION:Tammie West has a high-grade stenosis in the left subclavian artery just proximal to the takeoff of the large left vert making it not suitable for percutaneous intervention. She would be a good candidate for left common carotid to subclavian bypass. I reviewed this with Dr. Servando Snare, vascular surgeon, who concurs.    Cardiac event monitor 05/05/19-06/03/19:  SInus bradycardia, normal sinus rhythm and sinus tachycardia. The average heart rate was 65bpm and ranged from 26 to 103bpm.  Junctional bradycardia during sleep as low as 26bpm.  Occasional PVCs and PACs, atrial couplets. - Results reviewed by cardiologist Fransico Him, MD, "Heart monitor showed NSR with average heart rate 65bpm. HR got transiently low at 26bpm during a sinus pause during sleep. There was some extra heart beats from the top and bottom of her heart which are benign. No sinus or junctional bradycardia or pauses during awake hours"   Echo 05/26/19: IMPRESSIONS   1. Left ventricular ejection fraction, by estimation, is 65 to 70%. The  left ventricle has normal function. The left ventricle has no regional  wall motion abnormalities. Left ventricular diastolic parameters are  consistent with Grade I diastolic  dysfunction (impaired relaxation).  2. Right ventricular systolic function is normal. The right ventricular  size is normal. There is normal pulmonary artery systolic pressure.  3. Left atrial size was mildly dilated.  4. The mitral valve is abnormal. Trivial mitral valve regurgitation.  5. The aortic valve is tricuspid. Aortic valve regurgitation is not  visualized.  6. The inferior vena cava IVC measures <1.2 cm and spontaneously  collapses, suggesting volume depletion and a low RA pressure <3 mmHg.     Neuro/Psych PSYCHIATRIC DISORDERS Depression negative neurological ROS     GI/Hepatic negative GI ROS, Neg liver ROS,   Endo/Other  diabetesHypothyroidism   Renal/GU Renal InsufficiencyRenal disease     Musculoskeletal negative musculoskeletal ROS (+)   Abdominal   Peds  Hematology negative hematology ROS (+)   Anesthesia Other Findings   Reproductive/Obstetrics                           Anesthesia Physical Anesthesia Plan  ASA: III  Anesthesia Plan: General   Post-op Pain Management:    Induction: Intravenous  PONV Risk Score and Plan: 4 or greater and Ondansetron, Dexamethasone and Treatment may vary due to age or medical condition  Airway Management Planned: Oral ETT  Additional Equipment: Arterial line  Intra-op Plan:   Post-operative Plan: Extubation in OR  Informed Consent: I have reviewed the patients History and Physical, chart, labs and discussed the procedure including the risks,  benefits and alternatives for the proposed anesthesia with the patient or authorized representative who has indicated his/her understanding and acceptance.     Dental advisory given  Plan  Discussed with: Anesthesiologist and CRNA  Anesthesia Plan Comments: (PAT note written 01/06/2020 by Myra Gianotti, PA-C. )      Anesthesia Quick Evaluation

## 2020-01-10 ENCOUNTER — Other Ambulatory Visit (HOSPITAL_COMMUNITY)
Admission: RE | Admit: 2020-01-10 | Discharge: 2020-01-10 | Disposition: A | Discharge status: Home / Self Care | Payer: Medicare HMO | Source: Ambulatory Visit | Attending: Vascular Surgery | Admitting: Vascular Surgery

## 2020-01-10 DIAGNOSIS — Z01812 Encounter for preprocedural laboratory examination: Secondary | ICD-10-CM | POA: Insufficient documentation

## 2020-01-10 DIAGNOSIS — Z20822 Contact with and (suspected) exposure to covid-19: Secondary | ICD-10-CM | POA: Insufficient documentation

## 2020-01-10 LAB — SARS CORONAVIRUS 2 (TAT 6-24 HRS): SARS Coronavirus 2: NEGATIVE

## 2020-01-11 ENCOUNTER — Ambulatory Visit (INDEPENDENT_AMBULATORY_CARE_PROVIDER_SITE_OTHER): Payer: Medicare HMO | Admitting: Podiatry

## 2020-01-11 ENCOUNTER — Encounter: Payer: Self-pay | Admitting: Podiatry

## 2020-01-11 ENCOUNTER — Other Ambulatory Visit: Payer: Self-pay

## 2020-01-11 DIAGNOSIS — E1151 Type 2 diabetes mellitus with diabetic peripheral angiopathy without gangrene: Secondary | ICD-10-CM

## 2020-01-11 DIAGNOSIS — L84 Corns and callosities: Secondary | ICD-10-CM

## 2020-01-12 ENCOUNTER — Inpatient Hospital Stay (HOSPITAL_COMMUNITY): Payer: Medicare HMO | Admitting: Certified Registered Nurse Anesthetist

## 2020-01-12 ENCOUNTER — Other Ambulatory Visit: Payer: Self-pay

## 2020-01-12 ENCOUNTER — Inpatient Hospital Stay (HOSPITAL_COMMUNITY)
Admission: RE | Admit: 2020-01-12 | Discharge: 2020-01-14 | DRG: 253 | Disposition: A | Discharge status: Home / Self Care | Payer: Medicare HMO | Attending: Vascular Surgery | Admitting: Vascular Surgery

## 2020-01-12 ENCOUNTER — Encounter (HOSPITAL_COMMUNITY): Payer: Self-pay | Admitting: Vascular Surgery

## 2020-01-12 ENCOUNTER — Encounter (HOSPITAL_COMMUNITY)
Admission: RE | Disposition: A | Discharge status: Home / Self Care | Payer: Self-pay | Source: Home / Self Care | Attending: Vascular Surgery

## 2020-01-12 ENCOUNTER — Inpatient Hospital Stay (HOSPITAL_COMMUNITY): Payer: Medicare HMO | Admitting: Vascular Surgery

## 2020-01-12 DIAGNOSIS — N184 Chronic kidney disease, stage 4 (severe): Secondary | ICD-10-CM | POA: Diagnosis present

## 2020-01-12 DIAGNOSIS — Z888 Allergy status to other drugs, medicaments and biological substances status: Secondary | ICD-10-CM

## 2020-01-12 DIAGNOSIS — K5090 Crohn's disease, unspecified, without complications: Secondary | ICD-10-CM | POA: Diagnosis present

## 2020-01-12 DIAGNOSIS — Z79899 Other long term (current) drug therapy: Secondary | ICD-10-CM

## 2020-01-12 DIAGNOSIS — M1990 Unspecified osteoarthritis, unspecified site: Secondary | ICD-10-CM | POA: Diagnosis present

## 2020-01-12 DIAGNOSIS — Z91013 Allergy to seafood: Secondary | ICD-10-CM

## 2020-01-12 DIAGNOSIS — K219 Gastro-esophageal reflux disease without esophagitis: Secondary | ICD-10-CM | POA: Diagnosis present

## 2020-01-12 DIAGNOSIS — Z95820 Peripheral vascular angioplasty status with implants and grafts: Secondary | ICD-10-CM | POA: Diagnosis not present

## 2020-01-12 DIAGNOSIS — Z90710 Acquired absence of both cervix and uterus: Secondary | ICD-10-CM

## 2020-01-12 DIAGNOSIS — F32A Depression, unspecified: Secondary | ICD-10-CM | POA: Diagnosis present

## 2020-01-12 DIAGNOSIS — Z7982 Long term (current) use of aspirin: Secondary | ICD-10-CM

## 2020-01-12 DIAGNOSIS — E1122 Type 2 diabetes mellitus with diabetic chronic kidney disease: Secondary | ICD-10-CM | POA: Diagnosis present

## 2020-01-12 DIAGNOSIS — Z880 Allergy status to penicillin: Secondary | ICD-10-CM

## 2020-01-12 DIAGNOSIS — Z20822 Contact with and (suspected) exposure to covid-19: Secondary | ICD-10-CM | POA: Diagnosis present

## 2020-01-12 DIAGNOSIS — Z886 Allergy status to analgesic agent status: Secondary | ICD-10-CM | POA: Diagnosis not present

## 2020-01-12 DIAGNOSIS — G43909 Migraine, unspecified, not intractable, without status migrainosus: Secondary | ICD-10-CM | POA: Diagnosis present

## 2020-01-12 DIAGNOSIS — Z881 Allergy status to other antibiotic agents status: Secondary | ICD-10-CM

## 2020-01-12 DIAGNOSIS — I771 Stricture of artery: Secondary | ICD-10-CM | POA: Diagnosis present

## 2020-01-12 DIAGNOSIS — E039 Hypothyroidism, unspecified: Secondary | ICD-10-CM | POA: Diagnosis present

## 2020-01-12 DIAGNOSIS — E1165 Type 2 diabetes mellitus with hyperglycemia: Secondary | ICD-10-CM | POA: Diagnosis not present

## 2020-01-12 DIAGNOSIS — Z885 Allergy status to narcotic agent status: Secondary | ICD-10-CM | POA: Diagnosis not present

## 2020-01-12 DIAGNOSIS — E1151 Type 2 diabetes mellitus with diabetic peripheral angiopathy without gangrene: Secondary | ICD-10-CM | POA: Diagnosis present

## 2020-01-12 DIAGNOSIS — I708 Atherosclerosis of other arteries: Principal | ICD-10-CM | POA: Diagnosis present

## 2020-01-12 DIAGNOSIS — I340 Nonrheumatic mitral (valve) insufficiency: Secondary | ICD-10-CM | POA: Diagnosis present

## 2020-01-12 DIAGNOSIS — F17210 Nicotine dependence, cigarettes, uncomplicated: Secondary | ICD-10-CM | POA: Diagnosis present

## 2020-01-12 DIAGNOSIS — E785 Hyperlipidemia, unspecified: Secondary | ICD-10-CM | POA: Diagnosis present

## 2020-01-12 DIAGNOSIS — I2510 Atherosclerotic heart disease of native coronary artery without angina pectoris: Secondary | ICD-10-CM | POA: Diagnosis present

## 2020-01-12 DIAGNOSIS — I129 Hypertensive chronic kidney disease with stage 1 through stage 4 chronic kidney disease, or unspecified chronic kidney disease: Secondary | ICD-10-CM | POA: Diagnosis present

## 2020-01-12 DIAGNOSIS — Z7902 Long term (current) use of antithrombotics/antiplatelets: Secondary | ICD-10-CM

## 2020-01-12 DIAGNOSIS — M810 Age-related osteoporosis without current pathological fracture: Secondary | ICD-10-CM | POA: Diagnosis present

## 2020-01-12 DIAGNOSIS — Z8249 Family history of ischemic heart disease and other diseases of the circulatory system: Secondary | ICD-10-CM

## 2020-01-12 HISTORY — PX: CAROTID-SUBCLAVIAN BYPASS GRAFT: SHX910

## 2020-01-12 LAB — POCT ACTIVATED CLOTTING TIME: Activated Clotting Time: 214 seconds

## 2020-01-12 LAB — GLUCOSE, CAPILLARY
Glucose-Capillary: 116 mg/dL — ABNORMAL HIGH (ref 70–99)
Glucose-Capillary: 154 mg/dL — ABNORMAL HIGH (ref 70–99)
Glucose-Capillary: 134 mg/dL — ABNORMAL HIGH (ref 70–99)
Glucose-Capillary: 120 mg/dL — ABNORMAL HIGH (ref 70–99)
Glucose-Capillary: 303 mg/dL — ABNORMAL HIGH (ref 70–99)

## 2020-01-12 LAB — CBC
HCT: 33.8 % — ABNORMAL LOW (ref 36.0–46.0)
Hemoglobin: 10.4 g/dL — ABNORMAL LOW (ref 12.0–15.0)
MCH: 31.3 pg (ref 26.0–34.0)
MCHC: 30.8 g/dL (ref 30.0–36.0)
MCV: 101.8 fL — ABNORMAL HIGH (ref 80.0–100.0)
Platelets: 332 10*3/uL (ref 150–400)
RBC: 3.32 MIL/uL — ABNORMAL LOW (ref 3.87–5.11)
RDW: 13.2 % (ref 11.5–15.5)
WBC: 12.8 10*3/uL — ABNORMAL HIGH (ref 4.0–10.5)
nRBC: 0 % (ref 0.0–0.2)

## 2020-01-12 LAB — ABO/RH: ABO/RH(D): O POS

## 2020-01-12 LAB — CREATININE, SERUM
Creatinine, Ser: 0.91 mg/dL (ref 0.44–1.00)
GFR, Estimated: >60 mL/min (ref >60)

## 2020-01-12 SURGERY — CREATION, BYPASS, ARTERIAL, SUBCLAVIAN TO CAROTID, USING GRAFT
Anesthesia: General | Site: Neck | Laterality: Left

## 2020-01-12 MED ORDER — DOCUSATE SODIUM 100 MG PO CAPS
100.0000 mg | ORAL_CAPSULE | Freq: Every day | ORAL | Status: DC
  Administered 2020-01-13 – 2020-01-14 (×2): 100 mg via ORAL
  Filled 2020-01-12 (×2): qty 1

## 2020-01-12 MED ORDER — SIMVASTATIN 20 MG PO TABS
10.0000 mg | ORAL_TABLET | Freq: Every day | ORAL | Status: DC
  Administered 2020-01-12 – 2020-01-13 (×2): 10 mg via ORAL
  Filled 2020-01-12 (×2): qty 1

## 2020-01-12 MED ORDER — HEPARIN SODIUM (PORCINE) 1000 UNIT/ML IJ SOLN
INTRAMUSCULAR | Status: DC | PRN
  Administered 2020-01-12: 3000 [IU] via INTRAVENOUS
  Administered 2020-01-12: 5000 [IU] via INTRAVENOUS

## 2020-01-12 MED ORDER — ROCURONIUM BROMIDE 100 MG/10ML IV SOLN
INTRAVENOUS | Status: DC | PRN
  Administered 2020-01-12: 75 mg via INTRAVENOUS

## 2020-01-12 MED ORDER — HEMOSTATIC AGENTS (NO CHARGE) OPTIME
TOPICAL | Status: DC | PRN
Start: 2020-01-12 — End: 2020-01-12
  Administered 2020-01-12: 1 via TOPICAL

## 2020-01-12 MED ORDER — ALUM & MAG HYDROXIDE-SIMETH 200-200-20 MG/5ML PO SUSP
15.0000 mL | ORAL | Status: DC | PRN
Start: 2020-01-12 — End: 2020-01-14

## 2020-01-12 MED ORDER — HEPARIN SODIUM (PORCINE) 1000 UNIT/ML IJ SOLN
INTRAMUSCULAR | Status: AC
  Filled 2020-01-12: qty 1

## 2020-01-12 MED ORDER — CHLORHEXIDINE GLUCONATE CLOTH 2 % EX PADS: 6.0000 | MEDICATED_PAD | Freq: Once | CUTANEOUS | Status: DC

## 2020-01-12 MED ORDER — PROTAMINE SULFATE 10 MG/ML IV SOLN
INTRAVENOUS | Status: DC | PRN
  Administered 2020-01-12 (×5): 10 mg via INTRAVENOUS

## 2020-01-12 MED ORDER — AMITRIPTYLINE HCL 50 MG PO TABS
25.0000 mg | ORAL_TABLET | Freq: Every day | ORAL | Status: DC
  Administered 2020-01-12 – 2020-01-13 (×2): 25 mg via ORAL
  Filled 2020-01-12 (×2): qty 1

## 2020-01-12 MED ORDER — CLOPIDOGREL BISULFATE 75 MG PO TABS
75.0000 mg | ORAL_TABLET | Freq: Every day | ORAL | Status: DC
  Administered 2020-01-13 – 2020-01-14 (×2): 75 mg via ORAL
  Filled 2020-01-12 (×2): qty 1

## 2020-01-12 MED ORDER — FENTANYL CITRATE (PF) 100 MCG/2ML IJ SOLN
25.0000 ug | INTRAMUSCULAR | Status: DC | PRN
  Administered 2020-01-12: 25 ug via INTRAVENOUS

## 2020-01-12 MED ORDER — CILIDINIUM-CHLORDIAZEPOXIDE 2.5-5 MG PO CAPS
1.0000 | ORAL_CAPSULE | Freq: Two times a day (BID) | ORAL | Status: DC
  Administered 2020-01-12 – 2020-01-14 (×3): 1 via ORAL
  Filled 2020-01-12 (×3): qty 1

## 2020-01-12 MED ORDER — HYDRALAZINE HCL 20 MG/ML IJ SOLN
INTRAMUSCULAR | Status: AC
  Filled 2020-01-12: qty 1

## 2020-01-12 MED ORDER — SUGAMMADEX SODIUM 200 MG/2ML IV SOLN
INTRAVENOUS | Status: DC | PRN
  Administered 2020-01-12: 50 mg via INTRAVENOUS
  Administered 2020-01-12: 100 mg via INTRAVENOUS

## 2020-01-12 MED ORDER — SODIUM CHLORIDE 0.9 % IV SOLN
INTRAVENOUS | Status: DC | PRN
  Administered 2020-01-12: 500 mL

## 2020-01-12 MED ORDER — FENTANYL CITRATE (PF) 100 MCG/2ML IJ SOLN
INTRAMUSCULAR | Status: AC
  Filled 2020-01-12: qty 2

## 2020-01-12 MED ORDER — ACETAMINOPHEN 500 MG PO TABS
ORAL_TABLET | ORAL | Status: AC
  Administered 2020-01-12: 1000 mg via ORAL
  Filled 2020-01-12: qty 2

## 2020-01-12 MED ORDER — PHENYLEPHRINE 40 MCG/ML (10ML) SYRINGE FOR IV PUSH (FOR BLOOD PRESSURE SUPPORT)
PREFILLED_SYRINGE | INTRAVENOUS | Status: DC | PRN
  Administered 2020-01-12: 80 ug via INTRAVENOUS

## 2020-01-12 MED ORDER — MORPHINE SULFATE ER 15 MG PO TBCR
15.0000 mg | EXTENDED_RELEASE_TABLET | Freq: Two times a day (BID) | ORAL | Status: DC
Start: 2020-01-12 — End: 2020-01-14
  Administered 2020-01-12 – 2020-01-14 (×4): 15 mg via ORAL
  Filled 2020-01-12 (×4): qty 1

## 2020-01-12 MED ORDER — ONDANSETRON HCL 4 MG/2ML IJ SOLN
INTRAMUSCULAR | Status: AC
  Filled 2020-01-12: qty 2

## 2020-01-12 MED ORDER — FENTANYL CITRATE (PF) 250 MCG/5ML IJ SOLN
INTRAMUSCULAR | Status: DC | PRN
  Administered 2020-01-12: 50 ug via INTRAVENOUS
  Administered 2020-01-12: 100 ug via INTRAVENOUS

## 2020-01-12 MED ORDER — METOPROLOL SUCCINATE ER 50 MG PO TB24
50.0000 mg | ORAL_TABLET | Freq: Every day | ORAL | Status: DC
  Administered 2020-01-12 – 2020-01-13 (×2): 50 mg via ORAL
  Filled 2020-01-12 (×2): qty 1

## 2020-01-12 MED ORDER — SODIUM CHLORIDE 0.9 % IV SOLN
INTRAVENOUS | Status: AC
  Filled 2020-01-12: qty 1.2

## 2020-01-12 MED ORDER — SODIUM CHLORIDE 0.9 % IV SOLN: INTRAVENOUS | Status: DC

## 2020-01-12 MED ORDER — VANCOMYCIN HCL IN DEXTROSE 1-5 GM/200ML-% IV SOLN
1000.0000 mg | INTRAVENOUS | Status: AC
  Administered 2020-01-12: 1000 mg via INTRAVENOUS
  Filled 2020-01-12: qty 200

## 2020-01-12 MED ORDER — LIDOCAINE HCL (PF) 1 % IJ SOLN
INTRAMUSCULAR | Status: AC
  Filled 2020-01-12: qty 30

## 2020-01-12 MED ORDER — GUAIFENESIN-DM 100-10 MG/5ML PO SYRP: 15.0000 mL | ORAL_SOLUTION | ORAL | Status: DC | PRN

## 2020-01-12 MED ORDER — SODIUM CHLORIDE 0.9 % IV SOLN: 500.0000 mL | Freq: Once | INTRAVENOUS | Status: DC | PRN

## 2020-01-12 MED ORDER — OXYCODONE-ACETAMINOPHEN 5-325 MG PO TABS
1.0000 | ORAL_TABLET | ORAL | Status: DC | PRN
  Administered 2020-01-13 (×2): 1 via ORAL
  Filled 2020-01-12 (×2): qty 1

## 2020-01-12 MED ORDER — LABETALOL HCL 5 MG/ML IV SOLN
INTRAVENOUS | Status: AC
  Filled 2020-01-12: qty 4

## 2020-01-12 MED ORDER — ONDANSETRON HCL 4 MG/2ML IJ SOLN: 4.0000 mg | Freq: Four times a day (QID) | INTRAMUSCULAR | Status: DC | PRN

## 2020-01-12 MED ORDER — ALLOPURINOL 100 MG PO TABS
100.0000 mg | ORAL_TABLET | Freq: Every day | ORAL | Status: DC
  Administered 2020-01-13 – 2020-01-14 (×2): 100 mg via ORAL
  Filled 2020-01-12 (×2): qty 1

## 2020-01-12 MED ORDER — DEXAMETHASONE SODIUM PHOSPHATE 10 MG/ML IJ SOLN
INTRAMUSCULAR | Status: DC | PRN
  Administered 2020-01-12: 5 mg via INTRAVENOUS

## 2020-01-12 MED ORDER — POTASSIUM CHLORIDE CRYS ER 20 MEQ PO TBCR: 20.0000 meq | EXTENDED_RELEASE_TABLET | Freq: Every day | ORAL | Status: DC | PRN

## 2020-01-12 MED ORDER — PANTOPRAZOLE SODIUM 40 MG PO TBEC
40.0000 mg | DELAYED_RELEASE_TABLET | Freq: Every day | ORAL | Status: DC
  Administered 2020-01-13 – 2020-01-14 (×2): 40 mg via ORAL
  Filled 2020-01-12 (×2): qty 1

## 2020-01-12 MED ORDER — MESALAMINE 1.2 G PO TBEC
1.2000 g | DELAYED_RELEASE_TABLET | Freq: Every day | ORAL | Status: DC
  Administered 2020-01-13 – 2020-01-14 (×3): 1.2 g via ORAL
  Filled 2020-01-12 (×2): qty 1

## 2020-01-12 MED ORDER — ROCURONIUM BROMIDE 10 MG/ML (PF) SYRINGE
PREFILLED_SYRINGE | INTRAVENOUS | Status: AC
  Filled 2020-01-12: qty 10

## 2020-01-12 MED ORDER — 0.9 % SODIUM CHLORIDE (POUR BTL) OPTIME
TOPICAL | Status: DC | PRN
  Administered 2020-01-12: 1000 mL

## 2020-01-12 MED ORDER — DEXAMETHASONE SODIUM PHOSPHATE 10 MG/ML IJ SOLN
INTRAMUSCULAR | Status: AC
  Filled 2020-01-12: qty 1

## 2020-01-12 MED ORDER — LACTATED RINGERS IV SOLN: INTRAVENOUS | Status: DC

## 2020-01-12 MED ORDER — ASPIRIN EC 81 MG PO TBEC
81.0000 mg | DELAYED_RELEASE_TABLET | Freq: Every day | ORAL | Status: DC
  Administered 2020-01-13 – 2020-01-14 (×2): 81 mg via ORAL
  Filled 2020-01-12 (×2): qty 1

## 2020-01-12 MED ORDER — METOPROLOL TARTRATE 5 MG/5ML IV SOLN: 2.0000 mg | INTRAVENOUS | Status: DC | PRN

## 2020-01-12 MED ORDER — LEVOTHYROXINE SODIUM 88 MCG PO TABS
88.0000 ug | ORAL_TABLET | Freq: Every day | ORAL | Status: DC
  Administered 2020-01-13 – 2020-01-14 (×2): 88 ug via ORAL
  Filled 2020-01-12 (×2): qty 1

## 2020-01-12 MED ORDER — ACETAMINOPHEN 325 MG PO TABS
325.0000 mg | ORAL_TABLET | ORAL | Status: DC | PRN
Start: 2020-01-12 — End: 2020-01-14
  Administered 2020-01-12: 650 mg via ORAL
  Filled 2020-01-12: qty 2
  Filled 2020-01-12: qty 1

## 2020-01-12 MED ORDER — LIDOCAINE 2% (20 MG/ML) 5 ML SYRINGE
INTRAMUSCULAR | Status: DC | PRN
  Administered 2020-01-12: 60 mg via INTRAVENOUS

## 2020-01-12 MED ORDER — FUROSEMIDE 20 MG PO TABS
20.0000 mg | ORAL_TABLET | ORAL | Status: DC
  Administered 2020-01-13: 20 mg via ORAL
  Filled 2020-01-12 (×2): qty 1

## 2020-01-12 MED ORDER — ONDANSETRON HCL 4 MG/2ML IJ SOLN
INTRAMUSCULAR | Status: DC | PRN
  Administered 2020-01-12: 4 mg via INTRAVENOUS

## 2020-01-12 MED ORDER — CHLORHEXIDINE GLUCONATE 0.12 % MT SOLN
15.0000 mL | Freq: Once | OROMUCOSAL | Status: AC
  Administered 2020-01-12: 15 mL via OROMUCOSAL
  Filled 2020-01-12: qty 15

## 2020-01-12 MED ORDER — PROPOFOL 10 MG/ML IV BOLUS
INTRAVENOUS | Status: DC | PRN
  Administered 2020-01-12: 100 mg via INTRAVENOUS
  Administered 2020-01-12: 30 mg via INTRAVENOUS

## 2020-01-12 MED ORDER — HYDRALAZINE HCL 20 MG/ML IJ SOLN
5.0000 mg | INTRAMUSCULAR | Status: DC | PRN
  Administered 2020-01-12: 5 mg via INTRAVENOUS

## 2020-01-12 MED ORDER — ACETAMINOPHEN 650 MG RE SUPP: 325.0000 mg | RECTAL | Status: DC | PRN

## 2020-01-12 MED ORDER — VITAMIN D3 25 MCG (1000 UNIT) PO TABS
2000.0000 [IU] | ORAL_TABLET | Freq: Every day | ORAL | Status: DC
  Administered 2020-01-13 – 2020-01-14 (×2): 2000 [IU] via ORAL
  Filled 2020-01-12 (×4): qty 2

## 2020-01-12 MED ORDER — HEPARIN SODIUM (PORCINE) 5000 UNIT/ML IJ SOLN
5000.0000 [IU] | Freq: Three times a day (TID) | INTRAMUSCULAR | Status: DC
  Administered 2020-01-13 – 2020-01-14 (×4): 5000 [IU] via SUBCUTANEOUS
  Filled 2020-01-12 (×4): qty 1

## 2020-01-12 MED ORDER — ESCITALOPRAM OXALATE 20 MG PO TABS
20.0000 mg | ORAL_TABLET | Freq: Every day | ORAL | Status: DC
  Administered 2020-01-12 – 2020-01-14 (×3): 20 mg via ORAL
  Filled 2020-01-12 (×3): qty 1

## 2020-01-12 MED ORDER — ACETAMINOPHEN 500 MG PO TABS: 1000.0000 mg | ORAL_TABLET | Freq: Once | ORAL | Status: AC

## 2020-01-12 MED ORDER — FENTANYL CITRATE (PF) 250 MCG/5ML IJ SOLN
INTRAMUSCULAR | Status: AC
  Filled 2020-01-12: qty 5

## 2020-01-12 MED ORDER — PHENYLEPHRINE HCL-NACL 10-0.9 MG/250ML-% IV SOLN
INTRAVENOUS | Status: DC | PRN
  Administered 2020-01-12: 50 ug/min via INTRAVENOUS

## 2020-01-12 MED ORDER — ORAL CARE MOUTH RINSE: 15.0000 mL | Freq: Once | OROMUCOSAL | Status: AC

## 2020-01-12 MED ORDER — AMLODIPINE BESYLATE 5 MG PO TABS
5.0000 mg | ORAL_TABLET | Freq: Every day | ORAL | Status: DC
  Administered 2020-01-13 – 2020-01-14 (×2): 5 mg via ORAL
  Filled 2020-01-12 (×2): qty 1

## 2020-01-12 MED ORDER — PROMETHAZINE HCL 25 MG/ML IJ SOLN: 6.2500 mg | INTRAMUSCULAR | Status: DC | PRN

## 2020-01-12 MED ORDER — MAGNESIUM SULFATE 2 GM/50ML IV SOLN: 2.0000 g | Freq: Every day | INTRAVENOUS | Status: DC | PRN

## 2020-01-12 MED ORDER — EPHEDRINE SULFATE-NACL 50-0.9 MG/10ML-% IV SOSY
PREFILLED_SYRINGE | INTRAVENOUS | Status: DC | PRN
  Administered 2020-01-12 (×2): 5 mg via INTRAVENOUS

## 2020-01-12 MED ORDER — LACTATED RINGERS IV SOLN: INTRAVENOUS | Status: DC | PRN

## 2020-01-12 MED ORDER — LABETALOL HCL 5 MG/ML IV SOLN
10.0000 mg | INTRAVENOUS | Status: DC | PRN
  Administered 2020-01-12: 10 mg via INTRAVENOUS

## 2020-01-12 MED ORDER — PHENOL 1.4 % MT LIQD: 1.0000 | OROMUCOSAL | Status: DC | PRN

## 2020-01-12 SURGICAL SUPPLY — 48 items
CANISTER SUCT 3000ML PPV (MISCELLANEOUS) ×2 IMPLANT
CATH ROBINSON RED A/P 18FR (CATHETERS) ×2 IMPLANT
CLIP VESOCCLUDE MED 6/CT (CLIP) ×2 IMPLANT
CLIP VESOCCLUDE SM WIDE 6/CT (CLIP) ×2 IMPLANT
DERMABOND ADVANCED (GAUZE/BANDAGES/DRESSINGS) ×1
DERMABOND ADVANCED .7 DNX12 (GAUZE/BANDAGES/DRESSINGS) ×1 IMPLANT
DRAIN CHANNEL 15F RND FF W/TCR (WOUND CARE) IMPLANT
ELECT REM PT RETURN 9FT ADLT (ELECTROSURGICAL) ×2
ELECTRODE REM PT RTRN 9FT ADLT (ELECTROSURGICAL) ×1 IMPLANT
EVACUATOR SILICONE 100CC (DRAIN) IMPLANT
GAUZE SPONGE 4X4 12PLY STRL (GAUZE/BANDAGES/DRESSINGS) ×2 IMPLANT
GLOVE BIO SURGEON STRL SZ7.5 (GLOVE) ×2 IMPLANT
GOWN STRL REUS W/ TWL LRG LVL3 (GOWN DISPOSABLE) ×2 IMPLANT
GOWN STRL REUS W/ TWL XL LVL3 (GOWN DISPOSABLE) ×1 IMPLANT
GOWN STRL REUS W/TWL LRG LVL3 (GOWN DISPOSABLE) ×4
GOWN STRL REUS W/TWL XL LVL3 (GOWN DISPOSABLE) ×2
GRAFT GORETEX STND 6X20 (Vascular Products) ×2 IMPLANT
GRAFT GORETEXSTD 6X20 (Vascular Products) ×1 IMPLANT
HEMOSTAT SNOW SURGICEL 2X4 (HEMOSTASIS) ×2 IMPLANT
INSERT FOGARTY SM (MISCELLANEOUS) ×4 IMPLANT
IV ADAPTER SYR DOUBLE MALE LL (MISCELLANEOUS) ×2 IMPLANT
KIT BASIN OR (CUSTOM PROCEDURE TRAY) ×2 IMPLANT
KIT SHUNT ARGYLE CAROTID ART 6 (VASCULAR PRODUCTS) IMPLANT
KIT TURNOVER KIT B (KITS) ×2 IMPLANT
NEEDLE HYPO 25GX1X1/2 BEV (NEEDLE) IMPLANT
NS IRRIG 1000ML POUR BTL (IV SOLUTION) ×6 IMPLANT
PACK CAROTID (CUSTOM PROCEDURE TRAY) ×2 IMPLANT
PAD ARMBOARD 7.5X6 YLW CONV (MISCELLANEOUS) ×4 IMPLANT
POSITIONER HEAD DONUT 9IN (MISCELLANEOUS) ×2 IMPLANT
SET COLLECT BLD 21X3/4 12 (NEEDLE) ×2 IMPLANT
SHUNT CAROTID BYPASS 10 (VASCULAR PRODUCTS) IMPLANT
SHUNT CAROTID BYPASS 12FRX15.5 (VASCULAR PRODUCTS) IMPLANT
STOPCOCK 4 WAY LG BORE MALE ST (IV SETS) IMPLANT
SUT ETHILON 3 0 PS 1 (SUTURE) IMPLANT
SUT PROLENE 5 0 C 1 24 (SUTURE) ×8 IMPLANT
SUT PROLENE 6 0 BV (SUTURE) ×4 IMPLANT
SUT PROLENE 7 0 BV 1 (SUTURE) IMPLANT
SUT SILK 3 0 (SUTURE)
SUT SILK 3-0 18XBRD TIE 12 (SUTURE) IMPLANT
SUT VIC AB 3-0 SH 27 (SUTURE) ×4
SUT VIC AB 3-0 SH 27X BRD (SUTURE) ×2 IMPLANT
SUT VICRYL 4-0 PS2 18IN ABS (SUTURE) IMPLANT
SYR BULB IRRIG 60ML STRL (SYRINGE) ×2 IMPLANT
SYR CONTROL 10ML LL (SYRINGE) IMPLANT
TOWEL GREEN STERILE (TOWEL DISPOSABLE) ×2 IMPLANT
TUBING ART PRESS 48 MALE/FEM (TUBING) IMPLANT
TUBING EXTENTION W/L.L. (IV SETS) IMPLANT
WATER STERILE IRR 1000ML POUR (IV SOLUTION) ×2 IMPLANT

## 2020-01-12 NOTE — Transfer of Care (Signed)
Immediate Anesthesia Transfer of Care Note  Patient: Tammie West  Procedure(s) Performed: LEFT COMMON CAROTID-SUBCLAVIAN ARTERY BYPASS WITH GRAFT (Left Neck)  Patient Location: PACU  Anesthesia Type:General  Level of Consciousness: drowsy and patient cooperative  Airway & Oxygen Therapy: Patient Spontanous Breathing and Patient connected to face mask oxygen  Post-op Assessment: Report given to RN and Post -op Vital signs reviewed and stable. Tobias Alexander updated elevated bp. PACU RN to give hydralazine PRN. Pt moving extremities x4.  Post vital signs: Reviewed  Last Vitals:  Vitals Value Taken Time  BP 174/108 01/12/20 1455  Temp    Pulse 65 01/12/20 1458  Resp 19 01/12/20 1502  SpO2 100 % 01/12/20 1500  Vitals shown include unvalidated device data.  Last Pain:  Vitals:   01/12/20 0924  TempSrc:   PainSc: 0-No pain         Complications: No complications documented.

## 2020-01-12 NOTE — Anesthesia Procedure Notes (Signed)
Procedure Name: Intubation Date/Time: 01/12/2020 1:03 PM Performed by: Janene Harvey, CRNA Pre-anesthesia Checklist: Patient identified, Emergency Drugs available, Suction available and Patient being monitored Patient Re-evaluated:Patient Re-evaluated prior to induction Oxygen Delivery Method: Circle system utilized Preoxygenation: Pre-oxygenation with 100% oxygen Induction Type: IV induction Ventilation: Mask ventilation without difficulty and Oral airway inserted - appropriate to patient size Laryngoscope Size: Mac and 3 Grade View: Grade I Tube type: Oral Tube size: 7.0 mm Number of attempts: 1 Airway Equipment and Method: Stylet and Oral airway Placement Confirmation: ETT inserted through vocal cords under direct vision,  positive ETCO2 and breath sounds checked- equal and bilateral Secured at: 21 cm Tube secured with: Tape Dental Injury: Teeth and Oropharynx as per pre-operative assessment

## 2020-01-12 NOTE — Anesthesia Postprocedure Evaluation (Signed)
Anesthesia Post Note  Patient: Tammie West  Procedure(s) Performed: LEFT COMMON CAROTID-SUBCLAVIAN ARTERY BYPASS WITH GRAFT (Left Neck)     Patient location during evaluation: PACU Anesthesia Type: General Level of consciousness: sedated Pain management: pain level controlled Vital Signs Assessment: post-procedure vital signs reviewed and stable Respiratory status: spontaneous breathing and respiratory function stable Cardiovascular status: stable Postop Assessment: no apparent nausea or vomiting Anesthetic complications: no   No complications documented.  Last Vitals:  Vitals:   01/12/20 1610 01/12/20 1638  BP:  (!) 143/52  Pulse:    Resp:  16  Temp: 36.9 C 36.4 C  SpO2:      Last Pain:  Vitals:   01/12/20 1638  TempSrc: Oral  PainSc: 6                  Trevar Boehringer DANIEL

## 2020-01-12 NOTE — Anesthesia Procedure Notes (Signed)
Arterial Line Insertion Start/End12/16/2021 12:45 PM Performed by: Janene Harvey, CRNA  Patient location: Pre-op. Lidocaine 1% used for infiltration Right, radial was placed Catheter size: 20 G Hand hygiene performed  and maximum sterile barriers used  Allen's test indicative of satisfactory collateral circulation Attempts: 1 Procedure performed without using ultrasound guided technique. Following insertion, dressing applied and Biopatch. Patient tolerated the procedure well with no immediate complications.

## 2020-01-12 NOTE — H&P (Signed)
HP    History of Present Illness: This is a 86 y.o. female with a history of left lower extremity wound underwent angiography last March with drug-eluting stent placement in her left SFA.  She has done very well since that time.  Upon following up with Dr. [name] she was noted to have multiple dizzy episodes and has a previous carotid duplex which demonstrated high-grade left subclavian artery stenosis.  She subsequently underwent CT angiogram of her neck and she was scheduled for angiography for which she presented today.  In questioning she does have some coolness to her left hand and also has multiple episodes of dizzy feeling like she is going to pass out.  This is being ongoing for at least 6 months.  She has never had stroke TIA or amaurosis.  She has a history of coronary artery disease with previous angiogram.  Other risk factors include diabetes, hyperlipidemia, hypertension.      Past Medical History:  Diagnosis Date  . Bilateral carpal tunnel syndrome 02/17/2017  . Carotid artery disease (HCC)    1-39% bilateral stenosis  . Carpal tunnel syndrome   . CKD (chronic kidney disease) stage 4, GFR 15-29 ml/min (HCC)   . Coronary artery disease 11/2014    cath with Prox RCA to Mid RCA lesion, 45% stenosed and Ost LAD to Prox LAD lesion, 30% stenosed byt recent cath and high calcium score by coronary CTA.  . Crohn's disease ([place])   . Depression   . Diabetes mellitus without complication ([place])   . GERD (gastroesophageal reflux disease)   . Goiter   . Hyperlipidemia   . Hypertension   . Hypothyroidism   . Insomnia   . Migraine   . Mitral regurgitation    mild to moderate by echo 04/2017  . Osteoarthritis   . Osteoporosis   . PVD (peripheral vascular disease) ([place])    -s/p stenting of the 99% left mid SFA - followed by Dr. [name]  . Trochanteric bursitis          Past Surgical History:  Procedure Laterality Date  . ABDOMINAL HYSTERECTOMY    .  APPENDECTOMY    . CARDIAC CATHETERIZATION N/A 12/13/2014   Procedure: Left Heart Cath and Coronary Angiography;  Surgeon: Belva Crome, MD;  Location: [facility];  Service: Cardiovascular;  Laterality: N/A;  . LOWER EXTREMITY ANGIOGRAPHY Left 04/04/2019   Procedure: LOWER EXTREMITY ANGIOGRAPHY;  Surgeon: Lorretta Harp, MD;  Location: [facility];  Service: Cardiovascular;  Laterality: Left;  . PERIPHERAL VASCULAR INTERVENTION Left 04/04/2019   Procedure: PERIPHERAL VASCULAR INTERVENTION;  Surgeon: Lorretta Harp, MD;  Location: [facility];  Service: Cardiovascular;  Laterality: Left;         Allergies  Allergen Reactions  . Atorvastatin Nausea And Vomiting  . Byetta 10 Mcg Pen [Exenatide] Nausea And Vomiting  . Cefaclor Other (See Comments)    Pt does not remember the reaction  . Codeine Nausea And Vomiting       . Crestor [Rosuvastatin] Nausea And Vomiting  . Dexilant [Dexlansoprazole] Diarrhea  . Erythromycin Hives and Swelling  . Fosamax [Alendronate Sodium] Other (See Comments)    GI intolerance  . Macrobid [employer] Macro] Other (See Comments)    Pt does not remember the reaction  . Metformin And Related Nausea And Vomiting  . Nsaids     Elevated creatinine   . Penicillins Hives and Swelling    Has patient had a PCN reaction  causing immediate rash, facial/tongue/throat swelling, SOB or lightheadedness with hypotension: Yes Has patient had a PCN reaction causing severe rash involving mucus membranes or skin necrosis: No Has patient had a PCN reaction that required hospitalization No Has patient had a PCN reaction occurring within the last 10 years: No If all of the above answers are "NO", then may proceed with Cephalosporin use.  . Tolmetin Other (See Comments)    Elevated creatinine   . Shellfish Allergy Nausea And Vomiting, Rash and Other (See Comments)    Felt spaced out           Prior to Admission  medications   Medication Sig Start Date End Date Taking? Authorizing Provider  Accu-Chek FastClix Lancets MISC USE 1 LANCET TO CHECK BLOOD SUGAR 3 TIMES DAILY FOR TYPE 2 DIABETES MELLITUS ON INSULIN 05/24/18  Yes [provider]  ACCU-CHEK SMARTVIEW test strip USE TO CHECK BLOOD SUGAR THREE TIMES DAILY 03/17/18  Yes [provider]  allopurinol (ZYLOPRIM) 100 MG tablet Take 100 mg by mouth daily. 01/26/19  Yes [provider]  amitriptyline (ELAVIL) 25 MG tablet Take 25 mg by mouth at bedtime.   Yes [provider]  amLODipine (NORVASC) 5 MG tablet Take 5 mg by mouth daily. 01/09/17  Yes [provider]  aspirin EC 81 MG tablet Take 1 tablet (81 mg total) by mouth daily. 01/10/15  Yes Turner, Eber Hong, MD  clidinium-chlordiazePOXIDE (LIBRAX) 5-2.5 MG capsule Take 1 capsule by mouth 2 (two) times daily.   Yes [provider]  clopidogrel (PLAVIX) 75 MG tablet Take 1 tablet (75 mg total) by mouth daily. 11/23/19  Yes Turner, Eber Hong, MD  diphenhydrAMINE (BENADRYL) 50 MG capsule Take 50 mg by mouth once.   Yes [provider]  escitalopram (LEXAPRO) 20 MG tablet Take 20 mg by mouth daily. 04/23/19  Yes [provider]  EUTHYROX 88 MCG tablet Take 88 mcg by mouth daily before breakfast.  10/29/18  Yes [provider]  furosemide (LASIX) 40 MG tablet Take 20 mg by mouth daily.  06/06/19  Yes [provider]  lidocaine (XYLOCAINE) 5 % ointment Apply 1 application topically 4 (four) times daily as needed for mild pain.  06/29/18  Yes [provider]  mesalamine (LIALDA) 1.2 G EC tablet Take 2.4 g by mouth daily after breakfast.   Yes [provider]  metoprolol succinate (TOPROL-XL) 50 MG 24 hr tablet Take 50 mg by mouth at bedtime. 07/14/19  Yes [provider]  MORPHABOND ER 15 MG T12A Take 15 mg by mouth in the morning and at bedtime.  02/01/18  Yes [provider]   nitroGLYCERIN (NITROSTAT) 0.4 MG SL tablet Place 1 tablet (0.4 mg total) under the tongue every 5 (five) minutes as needed for chest pain. 11/15/19  Yes Lorretta Harp, MD  Oxycodone HCl 10 MG TABS Take 10 mg by mouth every 4 (four) hours as needed (Pain).  07/11/17  Yes [provider]  polyethylene glycol (MIRALAX / GLYCOLAX) packet Take 17 g by mouth daily as needed for mild constipation.   Yes [provider]  predniSONE (DELTASONE) 50 MG tablet 1 tablet 13 hours prior to procedure 1 tablet 7 hours prior to procedure 1 tablet 1 hour prior to procedure. Patient taking differently: Take 50 mg by mouth once. 50 mg 13 hours prior to procedure 50 mg   7 hours prior to procedure 50 mg 1 hour prior to procedure. 12/13/19  Yes [name],  Pearletha Forge, MD  Probiotic Product (ALIGN) 4 MG CAPS Take 1 capsule by mouth daily after breakfast.    Yes [provider]  simvastatin (ZOCOR) 10 MG tablet Take 10 mg by mouth at bedtime.  02/09/13  Yes [provider]    Social History        Socioeconomic History  . Marital status: Married    Spouse name: Not on file  . Number of children: Not on file  . Years of education: Not on file  . Highest education level: Not on file  Occupational History  . Not on file  Tobacco Use  . Smoking status: Current Some Day Smoker    Packs/day: 0.25    Types: Cigarettes  . Smokeless tobacco: Never Used  Substance and Sexual Activity  . Alcohol use: No  . Drug use: No  . Sexual activity: Not on file  Other Topics Concern  . Not on file  Social History Narrative  . Not on file   Social Determinants of Health      Financial Resource Strain:   . Difficulty of Paying Living Expenses: Not on file  Food Insecurity:   . Worried About Charity fundraiser in the Last Year: Not on file  . Ran Out of Food in the Last Year: Not on file  Transportation Needs:   . Lack of Transportation (Medical): Not on file  . Lack  of Transportation (Non-Medical): Not on file  Physical Activity:   . Days of Exercise per Week: Not on file  . Minutes of Exercise per Session: Not on file  Stress:   . Feeling of Stress : Not on file  Social Connections:   . Frequency of Communication with Friends and Family: Not on file  . Frequency of Social Gatherings with Friends and Family: Not on file  . Attends Religious Services: Not on file  . Active Member of Clubs or Organizations: Not on file  . Attends Archivist Meetings: Not on file  . Marital Status: Not on file  Intimate Partner Violence:   . Fear of Current or Ex-Partner: Not on file  . Emotionally Abused: Not on file  . Physically Abused: Not on file  . Sexually Abused: Not on file         Family History  Problem Relation Age of Onset  . Emphysema Father   . CAD Brother   . Heart disease Brother   . Heart attack Neg Hx   . Stroke Neg Hx     ROS:  Cardiovascular: []  chest pain/pressure []  palpitations []  SOB lying flat []  DOE []  pain in legs while walking []  pain in legs at rest []  pain in legs at night []  non-healing ulcers []  hx of DVT []  swelling in legs  Pulmonary: []  productive cough []  asthma/wheezing []  home O2  Neurologic: []  weakness in []  arms []  legs []  numbness in []  arms []  legs []  hx of CVA []  mini stroke [] difficulty speaking or slurred speech []  temporary loss of vision in one eye [x]  dizziness  Hematologic: []  hx of cancer []  bleeding problems []  problems with blood clotting easily  Endocrine:   []  diabetes []  thyroid disease  GI []  vomiting blood []  blood in stool  GU: []  CKD/renal failure []  HD--[]  M/W/F or []  T/T/S []  burning with urination []  blood in urine  Psychiatric: []  anxiety []  depression  Musculoskeletal: []  arthritis []  joint pain  Integumentary: []  rashes []  ulcers  Constitutional: []   fever []  chills   Physical Examination  Vitals:   01/12/20  0903  BP: (!) 197/83  Pulse: 81  Resp: 18  Temp: 98.1 F (36.7 C)  SpO2: 97%     General:  nad HENT: WNL, normocephalic Pulmonary: normal non-labored breathing Cardiac: 1+ left brachial and radial artery pulse Abdomen: soft, NT/ND, no masses Musculoskeletal: no muscle wasting or atrophy       Neurologic: A&O X 3; Appropriate Affect ; SENSATION: normal; MOTOR FUNCTION:  moving all extremities equally. Speech is fluent/normal   CBC Labs (Brief)          Component Value Date/Time   WBC 7.6 12/13/2019 1437   WBC 8.4 01/07/2019 1136   RBC 3.48 (L) 12/13/2019 1437   RBC 3.55 (L) 01/07/2019 1136   HGB 11.1 12/13/2019 1437   HCT 34.0 12/13/2019 1437   PLT 383 12/13/2019 1437   MCV 98 (H) 12/13/2019 1437   MCH 31.9 12/13/2019 1437   MCH 29.6 01/07/2019 1136   MCHC 32.6 12/13/2019 1437   MCHC 31.8 (L) 01/07/2019 1136   RDW 11.7 12/13/2019 1437   LYMPHSABS 1,722 01/07/2019 1136   MONOABS 1.3 (H) 06/10/2015 1603   EOSABS 302 01/07/2019 1136   BASOSABS 84 01/07/2019 1136      BMET Labs (Brief)          Component Value Date/Time   NA 140 12/13/2019 1437   K 4.9 12/13/2019 1437   CL 104 12/13/2019 1437   CO2 23 12/13/2019 1437   GLUCOSE 172 (H) 12/13/2019 1437   GLUCOSE 190 (H) 01/07/2019 1136   BUN 20 12/13/2019 1437   CREATININE 0.96 12/13/2019 1437   CREATININE 1.74 (H) 01/07/2019 1136   CALCIUM 9.9 12/13/2019 1437   GFRNONAA 54 (L) 12/13/2019 1437   GFRNONAA 26 (L) 01/07/2019 1136   GFRAA 62 12/13/2019 1437   GFRAA 31 (L) 01/07/2019 1136      COAGS: Recent Labs       Lab Results  Component Value Date   INR 1.04 12/13/2014   INR 0.96 12/11/2014       Non-Invasive Vascular Imaging:   I reviewed her angiography today with Dr. Alvester Chou   CTA IMPRESSION: Calcified and noncalcified atherosclerotic changes of the proximal left subclavian artery as detailed above with areas of ostial and proximal subclavian  stenosis. See above comment. Left subclavian artery remains patent without acute occlusion or thrombus. Left vertebral artery is widely patent.  Thoracic aortic atherosclerosis without acute vascular finding, aneurysm or dissection  Native coronary atherosclerosis  Cardiomegaly without CHF  Dependent left lower lobe rounded atelectasis versus rounded consolidation and small adjacent pleural effusion. Difficult to exclude left basilar pneumonia..  Carotid Duplex Right Carotid Findings:  +----------+--------+--------+--------+------------------+--------+       PSV cm/sEDV cm/sStenosisPlaque DescriptionComments  +----------+--------+--------+--------+------------------+--------+  CCA Prox 75   11                      +----------+--------+--------+--------+------------------+--------+  CCA Distal70   11                      +----------+--------+--------+--------+------------------+--------+  ICA Prox 80   15       heterogenous         +----------+--------+--------+--------+------------------+--------+  ICA Mid  105   23   1-39%                 +----------+--------+--------+--------+------------------+--------+  ICA Distal112   27                      +----------+--------+--------+--------+------------------+--------+  ECA    117   11                      +----------+--------+--------+--------+------------------+--------+   +----------+--------+-------+----------------+-------------------+       PSV cm/sEDV cmsDescribe    Arm Pressure (mmHG)  +----------+--------+-------+----------------+-------------------+  [id number]       Multiphasic, [id number]          +----------+--------+-------+----------------+-------------------+    +---------+--------+--+--------+--+---------+  VertebralPSV cm/s90EDV cm/s14Antegrade  +---------+--------+--+--------+--+---------+      Left Carotid Findings:  +----------+--------+--------+--------+------------------+---------+       PSV cm/sEDV cm/sStenosisPlaque DescriptionComments   +----------+--------+--------+--------+------------------+---------+  CCA Prox 72   12                       +----------+--------+--------+--------+------------------+---------+  CCA Mid  86   15   <50%  heterogenous          +----------+--------+--------+--------+------------------+---------+  CCA Distal98   16                       +----------+--------+--------+--------+------------------+---------+  ICA Prox 103   26       heterogenous          +----------+--------+--------+--------+------------------+---------+  ICA Mid  145   27   1-39%           turbulent  +----------+--------+--------+--------+------------------+---------+  ICA Distal107   28                       +----------+--------+--------+--------+------------------+---------+  ECA    95   7                        +----------+--------+--------+--------+------------------+---------+   +----------+--------+--------+-------------------+-------------------+       PSV cm/sEDV cm/sDescribe      Arm Pressure (mmHG)  +----------+--------+--------+-------------------+-------------------+  [id number]       Stenotic, turbulent122          +----------+--------+--------+-------------------+-------------------+   +---------+--------+--+--------+--+---------------------+  VertebralPSV XN/T70YFV cm/s15Systolic deceleration  +---------+--------+--+--------+--+---------------------+   Left  vertebral artery demonstrates abnormal, antegrade flow with early  systolic deceleration.      Summary:  Right Carotid: Velocities in the right ICA are consistent with a 1-39%  stenosis.   Left Carotid: Velocities in the left ICA are consistent with a 1-39%  stenosis.        Non-hemodynamically significant plaque <50% noted in the  CCA.   Vertebrals: Right vertebral artery demonstrates antegrade flow.        Abnormal antegrade flow with early systolic deceleration in  the        left vertebral artery.  Subclavians: Left subclavian artery was stenotic. Left subclavian artery  flow        was disturbed. Normal flow hemodynamics were seen in the  right        subclavian artery.   ASSESSMENT/PLAN: This is a 86 y.o. female with history of peripheral arterial disease status post stenting she has done very well from this.  She now has notable subclavian artery stenosis that appears to be symptomatic with multiple dizzy episodes and reversal of flow late in her vertebral artery.  Given the location she is not a candidate for stenting. Plan for left carotid artery to subclavian artery bypass today in OR.   Payten Beaumier C. [name], MD [facility] [place] Office: [phone] Pager: [phone]

## 2020-01-12 NOTE — Op Note (Signed)
Patient name: Tammie West MRN: [id number] DOB: Oct 12, 1932 Sex: female  01/12/2020 Pre-operative Diagnosis: Symptomatic left subclavian artery  Post-operative diagnosis:  Same Surgeon:  Erlene Quan C. [name], MD Assistant: Paulo Fruit, PA Procedure Performed:   Left common carotid artery to left subclavian artery bypass with 6 mm PTFE  Indications: 86 year old female with a history of symptomatic left subclavian artery stenosis.  She underwent attempted stenting but given the proximity to the vertebral artery she was subsequently indicated for bypass.  We discussed the risk and benefits as well as alternatives and she demonstrates good understanding and agrees to proceed.  An assistant was necessary for suction, retraction, assistance with anastomosis and wound closure.  Findings: Common carotid artery was initially dissected out proximally however the distal clamp would have had to be on a heavily calcified area.  I then dissected higher to where I could have a proximal distal clamp in a soft area.  The subclavian artery was free of disease laterally.  After bypass there was good signal in the subclavian artery distally which did augment with compression of the graft.  Carotid artery signal proximal and distal to the graft did not augment with compression.  Patient was alert and oriented moving all extremities on awakening from anesthesia   Procedure:  The patient was identified in the holding area and taken to the operating room where she was placed supine on the operative when general anesthesia was induced.  She was sterilely prepped and draped in the left neck and chest in the usual fashion antibiotics were ministered a timeout was called.  Transverse incision was made 1 fingerbreadth above the clavicle extending laterally from the sternocleidomastoid.  I dissected down through the platysma raise platysmal flaps.  I then dissected under the sternocleidomastoid to the IJ reflected this  anteriorly medially identified the common carotid artery dissected this out.  There was one hard area I moved higher approximately 2 cm dissecting free placed a vessel loop around this.  I then began dissecting from the medial position the fat pad which was very small in this patient reflecting this laterally.  Identified 1 crossing vein and artery which were divided between ties.  Identified the phrenic nerve this was protected the anterior scalene muscle was divided with cautery and the subclavian artery was identified.  We dissected out a branch and placed a vessel brown this and then placed Vesseloops proximally distally around the subclavian artery the patient was fully heparinized.  6 mm ringed PTFE was brought to the field.  Pressure was raised 160 mmHg or higher systolic.  I then clamped the carotid artery distally followed by proximally opened longitudinally.  I flushed in both directions.  I irrigated with heparinized saline.  I sewed the graft into side with 6-0 Prolene suture.  Prior to completion we allowed flushing all directions and then flushed through the graft from a proximal standpoint and then released our clamp check with Doppler we had good signal above and below the graft that would did not change.  I then clamped my subclavian artery distally and proximally opened longitudinally where it appeared healthy.  I straight my graft trimmed to size and sewed it into side with 6-0 Prolene suture.  Prior to completion allowed flushing all directions.  Doppler demonstrated good signal in subclavian artery that was graft dependent.  There was no change in the common carotid artery signal proximally and distally with graft compression.  50 mg of protamine was administered.  We closed  will we could have the fat pad over the graft and then closed the platysma followed by subcutaneous tissue with Vicryl and the skin was closed with Monocryl and Dermabond was placed at the level of skin.  She was then  awakened from anesthesia having tolerated procedure well.  She was noted to be neurologically intact moving all extremities.  She was then transferred recovery in stable condition.  All counts were correct at completion.  EBL: 200 cc   EBL: 200cc  Yesenia Locurto C. [name], MD [facility] Office: [phone] Pager: [phone]

## 2020-01-12 NOTE — Progress Notes (Signed)
Patient transferred to 4E from PACU. Telemetry applied, CCMD notified. VSS. Arterial line zeroed out. CHG bath completed. Patient complains of 6/10 pain posterior neck. Will administer pain medication. Patient oriented to room and staff. Call bell in reach. Daughter present.  Daymon Larsen, RN

## 2020-01-13 ENCOUNTER — Encounter (HOSPITAL_COMMUNITY): Payer: Self-pay | Admitting: Vascular Surgery

## 2020-01-13 LAB — GLUCOSE, CAPILLARY
Glucose-Capillary: 203 mg/dL — ABNORMAL HIGH (ref 70–99)
Glucose-Capillary: 119 mg/dL — ABNORMAL HIGH (ref 70–99)
Glucose-Capillary: 172 mg/dL — ABNORMAL HIGH (ref 70–99)
Glucose-Capillary: 179 mg/dL — ABNORMAL HIGH (ref 70–99)

## 2020-01-13 LAB — BASIC METABOLIC PANEL
Anion gap: 9 (ref 5–15)
BUN: 21 mg/dL (ref 8–23)
CO2: 25 mmol/L (ref 22–32)
Calcium: 8.3 mg/dL — ABNORMAL LOW (ref 8.9–10.3)
Chloride: 102 mmol/L (ref 98–111)
Creatinine, Ser: 1.02 mg/dL — ABNORMAL HIGH (ref 0.44–1.00)
GFR, Estimated: 54 mL/min — ABNORMAL LOW (ref >60)
Glucose, Bld: 220 mg/dL — ABNORMAL HIGH (ref 70–99)
Potassium: 4.4 mmol/L (ref 3.5–5.1)
Sodium: 136 mmol/L (ref 135–145)

## 2020-01-13 LAB — CBC
HCT: 21.3 % — ABNORMAL LOW (ref 36.0–46.0)
Hemoglobin: 6.6 g/dL — CL (ref 12.0–15.0)
MCH: 31.3 pg (ref 26.0–34.0)
MCHC: 31 g/dL (ref 30.0–36.0)
MCV: 100.9 fL — ABNORMAL HIGH (ref 80.0–100.0)
Platelets: 235 10*3/uL (ref 150–400)
RBC: 2.11 MIL/uL — ABNORMAL LOW (ref 3.87–5.11)
RDW: 13.4 % (ref 11.5–15.5)
WBC: 4.6 10*3/uL (ref 4.0–10.5)
nRBC: 0 % (ref 0.0–0.2)

## 2020-01-13 LAB — PREPARE RBC (CROSSMATCH)

## 2020-01-13 LAB — HEMOGLOBIN AND HEMATOCRIT, BLOOD
HCT: 39.2 % (ref 36.0–46.0)
Hemoglobin: 12.7 g/dL (ref 12.0–15.0)

## 2020-01-13 MED ORDER — POLYETHYLENE GLYCOL 3350 17 G PO PACK
17.0000 g | PACK | Freq: Every day | ORAL | Status: DC
  Administered 2020-01-13 – 2020-01-14 (×2): 17 g via ORAL
  Filled 2020-01-13 (×2): qty 1

## 2020-01-13 MED ORDER — SODIUM CHLORIDE 0.9% IV SOLUTION: Freq: Once | INTRAVENOUS | Status: AC

## 2020-01-13 MED ORDER — INSULIN ASPART 100 UNIT/ML ~~LOC~~ SOLN
0.0000 [IU] | Freq: Three times a day (TID) | SUBCUTANEOUS | Status: DC
  Administered 2020-01-13: 1 [IU] via SUBCUTANEOUS
  Administered 2020-01-13: 2 [IU] via SUBCUTANEOUS
  Administered 2020-01-14: 3 [IU] via SUBCUTANEOUS

## 2020-01-13 NOTE — Progress Notes (Addendum)
CRITICAL VALUE STICKER  CRITICAL VALUE: Hemoglobin 6.6  RECEIVER (on-site recipient of call): Levada Dy RN  DATE & TIME NOTIFIED: 12/17 @0625   MESSENGER (representative from lab):  MD NOTIFIED: On call Dr Standley Dakins  TIME OF NOTIFICATION:  [phone]  RESPONSE: He will place blood orders  Lonia Blood, RN

## 2020-01-13 NOTE — Progress Notes (Addendum)
  Progress Note    01/13/2020 8:39 AM 1 Day Post-Op  Subjective: no major complaints. States she had a lot of pain last night but much better this morning   Vitals:   01/13/20 0426 01/13/20 0814  BP: (!) 125/42 (!) 135/54  Pulse: 61 (!) 55  Resp: 14 19  Temp: 98.1 F (36.7 C) 97.8 F (36.6 C)  SpO2: 95% 95%   Physical Exam: Cardiac: regular Lungs:  Non labored Incisions:  Left supraclavicular incision is clean, dry and intact without swelling or hematoma Extremities: well perfused and warm. Palpable 2+ radial and brachial arteries bilaterally. 5/5 grips strength bilaterally Neurologic: alert and oriented. CN intact  CBC    Component Value Date/Time   WBC 4.6 01/13/2020 0445   RBC 2.11 (L) 01/13/2020 0445   HGB 6.6 (LL) 01/13/2020 0445   HGB 11.1 12/13/2019 1437   HCT 21.3 (L) 01/13/2020 0445   HCT 34.0 12/13/2019 1437   PLT 235 01/13/2020 0445   PLT 383 12/13/2019 1437   MCV 100.9 (H) 01/13/2020 0445   MCV 98 (H) 12/13/2019 1437   MCH 31.3 01/13/2020 0445   MCHC 31.0 01/13/2020 0445   RDW 13.4 01/13/2020 0445   RDW 11.7 12/13/2019 1437   LYMPHSABS 1,722 01/07/2019 1136   MONOABS 1.3 (H) 06/10/2015 1603   EOSABS 302 01/07/2019 1136   BASOSABS 84 01/07/2019 1136    BMET    Component Value Date/Time   NA 136 01/13/2020 0445   NA 140 12/13/2019 1437   K 4.4 01/13/2020 0445   CL 102 01/13/2020 0445   CO2 25 01/13/2020 0445   GLUCOSE 220 (H) 01/13/2020 0445   BUN 21 01/13/2020 0445   BUN 20 12/13/2019 1437   CREATININE 1.02 (H) 01/13/2020 0445   CREATININE 1.74 (H) 01/07/2019 1136   CALCIUM 8.3 (L) 01/13/2020 0445   GFRNONAA 54 (L) 01/13/2020 0445   GFRNONAA 26 (L) 01/07/2019 1136   GFRAA 62 12/13/2019 1437   GFRAA 31 (L) 01/07/2019 1136    INR    Component Value Date/Time   INR 1.0 01/05/2020 1427     Intake/Output Summary (Last 24 hours) at 01/13/2020 0839 Last data filed at 01/13/2020 0500 Gross per 24 hour  Intake 1480 ml  Output 850 ml   Net 630 ml     Assessment/Plan:  86 y.o. female is s/p  Left common carotid artery to left subclavian artery bypass with 6 mm PTFE  1 Day Post-Op. Doing well post op. Neurologically intact. Left upper extremity well perfused and warm with palpable pulses. Hgb dropped to 6.6. Transfusing 2 units PRBC this morning. Otherwise hemodynamically stable.  She will go home on  Aspirin, Statin and plavix. Possible discharge later today. Follow up with Dr. [name] will be arranged in 1 month  DVT prophylaxis: sq heparin   Karoline Caldwell, PA-C [facility] [phone] 01/13/2020 8:39 AM   I have independently interviewed and examined patient and agree with PA assessment and plan above. Transfuse 2 units today and can possibly d/c.   Izaia Say C. [name], MD [facility] [place] Office: [phone] Pager: [phone]

## 2020-01-13 NOTE — Discharge Summary (Signed)
Carotid Discharge Summary     Tammie West 10/21/1932 86 y.o. female  [id number]  Admission Date: 01/12/2020  Discharge Date: 01/14/2020  Physician: Thomes Lolling*  Admission Diagnosis: Subclavian artery stenosis, left [facility]) [I77.1]  Discharge Day services:    None  Hospital Course:  The patient was admitted to the hospital and taken to the operating room on 01/12/2020 and underwent left common carotid artery to subclavian artery bypass with 6 mm PTFE for symptomatic left subclavian artery stenosis. The pt tolerated the procedure well and was transported to the PACU in excellent condition.   By POD 1, the pt neuro status remained intact. She had some hyperglycemia overnight. Morning labs showed some acute post op anemia with hgb of 6.6. 2 units of PRBCs were ordered. Patient otherwise doing well. Minimal pain. Left upper extremity well perfused with palpable pulses. Transfused two units. Tolerating diet. Voiding without difficulty.   The remainder of the hospital course consisted of increasing mobilization and increasing intake of solids without difficulty.  POD#2 patient feeling much better following transfusion. Pain tolerable. Hemoglobin responded appropriately post transfusion up to 12.8. Left upper extremity well perfused with palpable brachial and radial pulse. Left neck incision intact without swelling or hematoma. Neurologically intact. Stable for discharge home. No post op paid medications prescribed as patient is managed by pain clinic. She will go home on Aspirin, statin and plavix. She has follow up arranged with Dr. [name] in 1 month  Recent Labs    01/13/20 0445  NA 136  K 4.4  CL 102  CO2 25  GLUCOSE 220*  BUN 21  CALCIUM 8.3*   Recent Labs    01/12/20 1530 01/13/20 0445 01/13/20 1816  WBC 12.8* 4.6  --   HGB 10.4* 6.6* 12.7  HCT 33.8* 21.3* 39.2  PLT 332 235  --    No results for input(s): INR in the last 72  hours.   Discharge Instructions    Discharge patient   Complete by: As directed    Discharge disposition: 01-Home or Self Care   Discharge patient date: 01/13/2020      Discharge Diagnosis:  Subclavian artery stenosis, left ([place]) [I77.1]  Secondary Diagnosis: Patient Active Problem List   Diagnosis Date Noted  . Subclavian artery stenosis, left ([place]) 01/12/2020  . Stenosis of left subclavian artery ([place]) 11/15/2019  . Carotid artery disease ([place])   . PVD (peripheral vascular disease) ([place])   . Critical limb ischemia with history of revascularization of same extremity ([place]) 03/31/2019  . Pain in right hand 05/29/2017  . Pain of left hand 04/21/2017  . Bilateral carpal tunnel syndrome 02/17/2017  . Mitral regurgitation 01/18/2017  . Chronic idiopathic constipation 12/16/2016  . Intestinal gas excretion 12/16/2016  . Chronic diastolic CHF (congestive heart failure) ([place]) 07/18/2016  . Bilateral lower extremity edema 05/05/2016  . Laryngopharyngeal reflux (LPR) 06/29/2015  . Referred otalgia of left ear 06/29/2015  . Angina effort   . CAD (coronary artery disease), native coronary artery 11/28/2014  . Tobacco abuse 09/11/2014  . Chest pain 02/23/2013  . Diabetes mellitus without complication ([place])   . Hypertension   . Crohn's disease ([place])   . Insomnia   . Hypothyroidism   . Goiter   . Osteoporosis   . Hyperlipidemia with target LDL less than 70   . Depression   . Carpal tunnel syndrome   . Renal failure   . GERD (gastroesophageal reflux disease)   . Osteoarthritis   . Trochanteric bursitis   .  Migraine    Past Medical History:  Diagnosis Date  . Bilateral carpal tunnel syndrome 02/17/2017  . Carotid artery disease (HCC)    1-39% bilateral stenosis  . Carpal tunnel syndrome   . CKD (chronic kidney disease) stage 4, GFR 15-29 ml/min (HCC)   . Coronary artery disease 11/2014    cath with Prox RCA to Mid RCA lesion, 45% stenosed and Ost LAD to Prox LAD lesion, 30%  stenosed byt recent cath and high calcium score by coronary CTA.  . Crohn's disease ([place])   . Depression   . Diabetes mellitus without complication ([place])   . GERD (gastroesophageal reflux disease)   . Goiter   . Hyperlipidemia   . Hypertension   . Hypothyroidism   . Insomnia   . Migraine   . Mitral regurgitation    mild to moderate by echo 04/2017  . Osteoarthritis   . Osteoporosis   . PVD (peripheral vascular disease) ([place])    -s/p stenting of the 99% left mid SFA - followed by Dr. [name]  . Trochanteric bursitis     Allergies as of 01/14/2020      Reactions   Atorvastatin Nausea And Vomiting   Byetta 10 Mcg Pen [exenatide] Nausea And Vomiting   Cefaclor Other (See Comments)   Pt does not remember the reaction   Codeine Nausea And Vomiting      Crestor [rosuvastatin] Nausea And Vomiting   Dexilant [dexlansoprazole] Diarrhea   Erythromycin Hives, Swelling   Fosamax [alendronate Sodium] Other (See Comments)   GI intolerance   Macrobid [nitrofurantoin Monohyd Macro] Other (See Comments)   Pt does not remember the reaction   Metformin And Related Nausea And Vomiting   Nsaids    Elevated creatinine    Penicillins Hives, Swelling   Has patient had a PCN reaction causing immediate rash, facial/tongue/throat swelling, SOB or lightheadedness with hypotension: Yes Has patient had a PCN reaction causing severe rash involving mucus membranes or skin necrosis: No Has patient had a PCN reaction that required hospitalization No Has patient had a PCN reaction occurring within the last 10 years: No If all of the above answers are "NO", then may proceed with Cephalosporin use.   Tolmetin Other (See Comments)   Elevated creatinine    Shellfish Allergy Nausea And Vomiting, Rash, Other (See Comments)   Felt spaced out      Medication List    TAKE these medications   Align 4 MG Caps Take 1 capsule by mouth daily after breakfast.   allopurinol 100 MG tablet Commonly known as:  ZYLOPRIM Take 100 mg by mouth daily.   amitriptyline 25 MG tablet Commonly known as: ELAVIL Take 25 mg by mouth at bedtime.   amLODipine 5 MG tablet Commonly known as: NORVASC Take 5 mg by mouth daily.   aspirin EC 81 MG tablet Take 1 tablet (81 mg total) by mouth daily.   clidinium-chlordiazePOXIDE 5-2.5 MG capsule Commonly known as: LIBRAX Take 1 capsule by mouth 2 (two) times daily.   clopidogrel 75 MG tablet Commonly known as: PLAVIX Take 1 tablet (75 mg total) by mouth daily.   escitalopram 20 MG tablet Commonly known as: LEXAPRO Take 20 mg by mouth daily.   Euthyrox 88 MCG tablet Generic drug: levothyroxine Take 88 mcg by mouth daily before breakfast.   furosemide 40 MG tablet Commonly known as: LASIX Take 20 mg by mouth every other day.   lidocaine 5 % ointment Commonly known as: XYLOCAINE Apply 1 application topically  4 (four) times daily as needed for mild pain.   mesalamine 1.2 g EC tablet Commonly known as: LIALDA Take 1.2 g by mouth daily after breakfast.   metoprolol succinate 50 MG 24 hr tablet Commonly known as: TOPROL-XL Take 50 mg by mouth at bedtime.   MorphaBond ER 15 MG T12a Generic drug: Morphine Sulfate ER Take 15 mg by mouth in the morning and at bedtime.   nitroGLYCERIN 0.4 MG SL tablet Commonly known as: NITROSTAT Place 1 tablet (0.4 mg total) under the tongue every 5 (five) minutes as needed for chest pain.   omeprazole 20 MG capsule Commonly known as: PRILOSEC Take 20 mg by mouth daily.   Oxycodone HCl 10 MG Tabs Take 10 mg by mouth every 4 (four) hours as needed (breakthrough pain).   polyethylene glycol 17 g packet Commonly known as: MIRALAX / GLYCOLAX Take 17 g by mouth daily as needed for mild constipation.   simvastatin 10 MG tablet Commonly known as: ZOCOR Take 10 mg by mouth at bedtime.   Vitamin D 50 MCG (2000 UT) tablet Take 2,000 Units by mouth daily.        Discharge Instructions:  [facility] Discharge Instructions Carotid Endarterectomy (CEA)  Please refer to the following instructions for your post-procedure care. Your surgeon or physician assistant will discuss any changes with you.  Activity  You are encouraged to walk as much as you can. You can slowly return to normal activities but must avoid strenuous activity and heavy lifting until your doctor tell you it's OK. Avoid activities such as vacuuming or swinging a golf club. You can drive after one week if you are comfortable and you are no longer taking prescription pain medications. It is normal to feel tired for serval weeks after your surgery. It is also normal to have difficulty with sleep habits, eating, and bowel movements after surgery. These will go away with time.  Bathing/Showering  You may shower after you come home. Do not soak in a bathtub, hot tub, or swim until the incision heals completely.  Incision Care  Shower every day. Clean your incision with mild soap and water. Pat the area dry with a clean towel. You do not need a bandage unless otherwise instructed. Do not apply any ointments or creams to your incision. You may have skin glue on your incision. Do not peel it off. It will come off on its own in about one week. Your incision may feel thickened and raised for several weeks after your surgery. This is normal and the skin will soften over time. For Men Only: It's OK to shave around the incision but do not shave the incision itself for 2 weeks. It is common to have numbness under your chin that could last for several months.  Diet  Resume your normal diet. There are no special food restrictions following this procedure. A low fat/low cholesterol diet is recommended for all patients with vascular disease. In order to heal from your surgery, it is CRITICAL to get adequate nutrition. Your body requires vitamins, minerals, and protein. Vegetables are the best source of vitamins and  minerals. Vegetables also provide the perfect balance of protein. Processed food has little nutritional value, so try to avoid this.  Medications  Resume taking all of your medications unless your doctor or physician assistant tells you not to.  If your incision is causing pain, you may take over-the- counter pain relievers such as acetaminophen (Tylenol). If  you were prescribed a stronger pain medication, please be aware these medications can cause nausea and constipation.  Prevent nausea by taking the medication with a snack or meal. Avoid constipation by drinking plenty of fluids and eating foods with a high amount of fiber, such as fruits, vegetables, and grains. Do not take Tylenol if you are taking prescription pain medications.  Follow Up  Our office will schedule a follow up appointment 2-3 weeks following discharge.  Please call us immediately for any of the following conditions  . Increased pain, redness, drainage (pus) from your incision site. . Fever of 101 degrees or higher. . If you should develop stroke (slurred speech, difficulty swallowing, weakness on one side of your body, loss of vision) you should call 911 and go to the nearest emergency room. .  Reduce your risk of vascular disease:  . Stop smoking. If you would like help call [facility] at 1-800-QUIT-NOW 210-484-1420) or [place] at ([phone]. . Manage your cholesterol . Maintain a desired weight . Control your diabetes . Keep your blood pressure down .  If you have any questions, please call the office at [phone].  Prescriptions given: None  Disposition: Home  Patient's condition: is Excellent  Follow up: 1. Dr. [name] in 4 weeks.   Dontai Pember PA-C [facility] [phone]   --- For [facility] use ---   Modified Rankin score at D/C (0-6): 0  IV medication needed for:  1. Hypertension: No 2. Hypotension: No  Post-op Complications: No  1. Post-op CVA or TIA:  No  If yes: Event classification (right eye, left eye, right cortical, left cortical, verterobasilar, other): n/a  If yes: Timing of event (intra-op, <6 hrs post-op, >=6 hrs post-op, unknown): n/a  2. CN injury: No  If yes: CN not injuried   3. Myocardial infarction: No  If yes: Dx by (EKG or clinical, Troponin): n/a  4.  CHF: No  5.  Dysrhythmia (new): No  6. Wound infection: No  7. Reperfusion symptoms: No  8. Return to OR: No  If yes: return to OR for (bleeding, neurologic, other CEA incision, other): n/a  Discharge medications: Statin use:  Yes ASA use:  Yes   Beta blocker use:  Yes ACE-Inhibitor use:  No  ARB use:  No CCB use: Yes P2Y12 Antagonist use: Yes, Valu.Nieves ] Plavix, [ ]  Plasugrel, [ ]  Ticlopinine, [ ]  Ticagrelor, [ ]  Other, [ ]  No for medical reason, [ ]  Non-compliant, [ ]  Not-indicated Anti-coagulant use:  No, [ ]  Warfarin, [ ]  Rivaroxaban, [ ]  Dabigatran,

## 2020-01-13 NOTE — Progress Notes (Signed)
Second unit of PRBC transfused without complications.  Pt up to ambulate in hall, front wheel walker, room air.  Walked approximately 275', tolerated well.     Back to room in bed, vital signs assessed, as BP cycling pt began to complain again of throbbing in back right side of head, radiating down neck down to shoulder. BP 150/86, SpO292% RA, HR 56, RR 19.  Pain seemed to subside only a bit after a few minutes, pt agreed to tylenol. Vascular PA paged to update.

## 2020-01-13 NOTE — Progress Notes (Signed)
Per order if CBG is above 140 contact physician for glycemic control. Patient CBG 203. Paged physician said he and Dr [name] will sort out later this morning.  Lonia Blood, RN

## 2020-01-13 NOTE — Progress Notes (Signed)
Upon rounding, patient complaining of chest pressure and head throbbing.  States that it was worse about 20 minutes prior but she "didn't want to bother anyone".  First unit of blood still hanging with about 30cc left in bag.  Infusion discontinued, vital signs assessed and all WNL.  Pt assisted back to bed from recliner.  Vascular PA notified, received orders to continue to hold infusion and to reassess patient in about half an hour. At that point if she is still experiencing symptoms, notify PA.  If symptoms have resolved, hang second unit of PRBC as per orders.  Patient also strongly encouraged to notify staff immediately if she is experiencing any unusual symptoms at any time during her stay.

## 2020-01-13 NOTE — Progress Notes (Signed)
Pt reassessed, states that she feels "much better".  She is aware that, based on her resolution of symptoms, we are going to run the second unit of blood and she will notify us immediately if she experiences anything concerning or unusual for her.

## 2020-01-13 NOTE — Discharge Instructions (Signed)
[facility]  Discharge Instructions   Carotid/ Subclavian artery Surgery  Please refer to the following instructions for your post-procedure care. Your surgeon or physician assistant will discuss any changes with you.  Activity  You are encouraged to walk as much as you can. You can slowly return to normal activities but must avoid strenuous activity and heavy lifting until your doctor tell you it's okay. Avoid activities such as vacuuming or swinging a golf club. You can drive after one week if you are comfortable and you are no longer taking prescription pain medications. It is normal to feel tired for serval weeks after your surgery. It is also normal to have difficulty with sleep habits, eating, and bowel movements after surgery. These will go away with time.  Bathing/Showering  Shower daily after you go home. Do not soak in a bathtub, hot tub, or swim until the incision heals completely.  Incision Care  Shower every day. Clean your incision with mild soap and water. Pat the area dry with a clean towel. You do not need a bandage unless otherwise instructed. Do not apply any ointments or creams to your incision. You may have skin glue on your incision. Do not peel it off. It will come off on its own in about one week. Your incision may feel thickened and raised for several weeks after your surgery. This is normal and the skin will soften over time.   For Men Only: It's okay to shave around the incision but do not shave the incision itself for 2 weeks. It is common to have numbness under your chin that could last for several months.  Diet  Resume your normal diet. There are no special food restrictions following this procedure. A low fat/low cholesterol diet is recommended for all patients with vascular disease. In order to heal from your surgery, it is CRITICAL to get adequate nutrition. Your body requires vitamins, minerals, and protein. Vegetables are  the best source of vitamins and minerals. Vegetables also provide the perfect balance of protein. Processed food has little nutritional value, so try to avoid this.  Medications  Resume taking all of your medications unless your doctor or physician assistant tells you not to. If your incision is causing pain, you may take over-the- counter pain relievers such as acetaminophen (Tylenol). If you were prescribed a stronger pain medication, please be aware these medications can cause nausea and constipation. Prevent nausea by taking the medication with a snack or meal. Avoid constipation by drinking plenty of fluids and eating foods with a high amount of fiber, such as fruits, vegetables, and grains.   Do not take Tylenol if you are taking prescription pain medications.  Follow Up  Our office will schedule a follow up appointment 2-3 weeks following discharge.  Please call us immediately for any of the following conditions  . Increased pain, redness, drainage (pus) from your incision site. . Fever of 101 degrees or higher. . If you should develop stroke (slurred speech, difficulty swallowing, weakness on one side of your body, loss of vision) you should call 911 and go to the nearest emergency room. .  Reduce your risk of vascular disease:  . Stop smoking. If you would like help call [facility] at 1-800-QUIT-NOW 3522764672) or [place] at ([phone]. . Manage your cholesterol . Maintain a desired weight . Control your diabetes . Keep your blood pressure down .  If you have any questions, please call the office at [phone].

## 2020-01-14 LAB — BPAM RBC
Blood Product Expiration Date: 202201172359
Blood Product Expiration Date: 202201172359
ISSUE DATE / TIME: 202112170805
ISSUE DATE / TIME: 202112171244
Unit Type and Rh: 5100
Unit Type and Rh: 5100

## 2020-01-14 LAB — GLUCOSE, CAPILLARY
Glucose-Capillary: 146 mg/dL — ABNORMAL HIGH (ref 70–99)
Glucose-Capillary: 257 mg/dL — ABNORMAL HIGH (ref 70–99)

## 2020-01-14 LAB — TYPE AND SCREEN
ABO/RH(D): O POS
Antibody Screen: NEGATIVE
Unit division: 0
Unit division: 0

## 2020-01-14 NOTE — Progress Notes (Addendum)
Progress Note    01/14/2020 8:11 AM 2 Days Post-Op  Subjective: no complaints. Sitting up in chair. States she is feeling much better. Headache and pain she was having yesterday subsided yesterday evening and she says she was able to rest well last night   Vitals:   01/13/20 2355 01/14/20 0430  BP: (!) 142/53 (!) 157/65  Pulse: (!) 50 (!) 50  Resp: 16 17  Temp: 97.9 F (36.6 C) 97.8 F (36.6 C)  SpO2: 94% 91%   Physical Exam: Cardiac: regular Lungs: non labored Incisions: left supraclavicular incision is clean, dry and intact. No swelling or hematoma Extremities: moving all extremities without deficits Neurologic: alert and oriented. CN intact  CBC    Component Value Date/Time   WBC 4.6 01/13/2020 0445   RBC 2.11 (L) 01/13/2020 0445   HGB 12.7 01/13/2020 1816   HGB 11.1 12/13/2019 1437   HCT 39.2 01/13/2020 1816   HCT 34.0 12/13/2019 1437   PLT 235 01/13/2020 0445   PLT 383 12/13/2019 1437   MCV 100.9 (H) 01/13/2020 0445   MCV 98 (H) 12/13/2019 1437   MCH 31.3 01/13/2020 0445   MCHC 31.0 01/13/2020 0445   RDW 13.4 01/13/2020 0445   RDW 11.7 12/13/2019 1437   LYMPHSABS 1,722 01/07/2019 1136   MONOABS 1.3 (H) 06/10/2015 1603   EOSABS 302 01/07/2019 1136   BASOSABS 84 01/07/2019 1136    BMET    Component Value Date/Time   NA 136 01/13/2020 0445   NA 140 12/13/2019 1437   K 4.4 01/13/2020 0445   CL 102 01/13/2020 0445   CO2 25 01/13/2020 0445   GLUCOSE 220 (H) 01/13/2020 0445   BUN 21 01/13/2020 0445   BUN 20 12/13/2019 1437   CREATININE 1.02 (H) 01/13/2020 0445   CREATININE 1.74 (H) 01/07/2019 1136   CALCIUM 8.3 (L) 01/13/2020 0445   GFRNONAA 54 (L) 01/13/2020 0445   GFRNONAA 26 (L) 01/07/2019 1136   GFRAA 62 12/13/2019 1437   GFRAA 31 (L) 01/07/2019 1136    INR    Component Value Date/Time   INR 1.0 01/05/2020 1427     Intake/Output Summary (Last 24 hours) at 01/14/2020 0811 Last data filed at 01/13/2020 1600 Gross per 24 hour  Intake  615 ml  Output --  Net 615 ml     Assessment/Plan:  86 y.o. female is s/p Left common carotid artery to leftsubclavian artery bypass with 6 mm PTFE 2 Days Post-Op. Doing well post op. Neurologically intact. Left upper extremity well perfused and warm with palpable pulses. Hgb now 12.7 post Transfusing 2 units PRBC. Otherwise hemodynamically stable.  She will go home on  Aspirin, Statin and plavix.She is stable for discharge home. She goes to pain management clinic so she understands that she will continue current pain medications and no post op medications will be sent to her pharmacy. Follow up with Dr. [name] will be arranged in 1 month   DVT prophylaxis: sq heparin    Karoline Caldwell, PA-C [facility] [phone] 01/14/2020 8:11 AM   I have seen and evaluated the patient. I agree with the PA note as documented above.  86 year old female status post left common carotid to left subclavian artery bypass.  She has palpable left radial pulse at the wrist.  She states her arm feels good.  Incision looks okay above the clavicle.  She did get 2 units of pRBCs yesterday hemoglobin is now 12.7 with appropriate response.  We will go home on  aspirin Plavix.  Follow-up arranged in 1 month with Dr. [name].  Plan discharge home today.  Marty Heck, MD [facility] [place] Office: [phone]

## 2020-01-14 NOTE — Plan of Care (Signed)

## 2020-01-15 NOTE — Progress Notes (Signed)
Subjective: Tammie West presents toda for follow up of painful right 5th digit lesion..She denies any redness, drainage or swelling.  She also has history of ulceration of left lateral malleolus which has healed as of 06/28/2019, with assistance of vascular intervention and outpatient Wound Care.   Her daughter is present during the visit.  Tammie Cha, MD is patient's PCP. Last visit was 12/02/2019.  She is followed by Dr. Lorie West for PAD. Has h/o CLI and nonhealing left lateral malleolar wound which healed with vascular intervention and local wound care. Last ABI of LLE is 1.30 with widely patent stent.   She will be having LUE bypass performed by Dr. [name] tomorrow.  Past Medical History:  Diagnosis Date  . Bilateral carpal tunnel syndrome 02/17/2017  . Carotid artery disease (HCC)    1-39% bilateral stenosis  . Carpal tunnel syndrome   . CKD (chronic kidney disease) stage 4, GFR 15-29 ml/min (HCC)   . Coronary artery disease 11/2014    cath with Prox RCA to Mid RCA lesion, 45% stenosed and Ost LAD to Prox LAD lesion, 30% stenosed byt recent cath and high calcium score by coronary CTA.  . Crohn's disease ([place])   . Depression   . Diabetes mellitus without complication ([place])   . GERD (gastroesophageal reflux disease)   . Goiter   . Hyperlipidemia   . Hypertension   . Hypothyroidism   . Insomnia   . Migraine   . Mitral regurgitation    mild to moderate by echo 04/2017  . Osteoarthritis   . Osteoporosis   . PVD (peripheral vascular disease) ([place])    -s/p stenting of the 99% left mid SFA - followed by Dr. Gwenlyn West  . Trochanteric bursitis      Current Outpatient Medications on File Prior to Visit  Medication Sig Dispense Refill  . allopurinol (ZYLOPRIM) 100 MG tablet Take 100 mg by mouth daily.    Marland Kitchen amitriptyline (ELAVIL) 25 MG tablet Take 25 mg by mouth at bedtime.    Marland Kitchen amLODipine (NORVASC) 5 MG tablet Take 5 mg by mouth daily.    Marland Kitchen aspirin EC 81 MG  tablet Take 1 tablet (81 mg total) by mouth daily.    . Cholecalciferol (VITAMIN D) 50 MCG (2000 UT) tablet Take 2,000 Units by mouth daily.    . clidinium-chlordiazePOXIDE (LIBRAX) 5-2.5 MG capsule Take 1 capsule by mouth 2 (two) times daily.    . clopidogrel (PLAVIX) 75 MG tablet Take 1 tablet (75 mg total) by mouth daily. 30 tablet 5  . escitalopram (LEXAPRO) 20 MG tablet Take 20 mg by mouth daily.    . EUTHYROX 88 MCG tablet Take 88 mcg by mouth daily before breakfast.     . furosemide (LASIX) 40 MG tablet Take 20 mg by mouth every other day.     . lidocaine (XYLOCAINE) 5 % ointment Apply 1 application topically 4 (four) times daily as needed for mild pain.     . mesalamine (LIALDA) 1.2 G EC tablet Take 1.2 g by mouth daily after breakfast.     . metoprolol succinate (TOPROL-XL) 50 MG 24 hr tablet Take 50 mg by mouth at bedtime.    [name] ER 15 MG T12A Take 15 mg by mouth in the morning and at bedtime.     . nitroGLYCERIN (NITROSTAT) 0.4 MG SL tablet Place 1 tablet (0.4 mg total) under the tongue every 5 (five) minutes as needed for chest pain. 25 tablet 2  . omeprazole (  PRILOSEC) 20 MG capsule Take 20 mg by mouth daily.    . Oxycodone HCl 10 MG TABS Take 10 mg by mouth every 4 (four) hours as needed (breakthrough pain).     . polyethylene glycol (MIRALAX / GLYCOLAX) packet Take 17 g by mouth daily as needed for mild constipation.    . Probiotic Product (ALIGN) 4 MG CAPS Take 1 capsule by mouth daily after breakfast.     . simvastatin (ZOCOR) 10 MG tablet Take 10 mg by mouth at bedtime.      Current Facility-Administered Medications on File Prior to Visit  Medication Dose Route Frequency Provider Last Rate Last Admin  . sodium chloride flush (NS) 0.9 % injection 3 mL  3 mL Intravenous Q12H Lorretta Harp, MD         Allergies  Allergen Reactions  . Atorvastatin Nausea And Vomiting  . Byetta 10 Mcg Pen [Exenatide] Nausea And Vomiting  . Cefaclor Other (See Comments)    Pt does  not remember the reaction  . Codeine Nausea And Vomiting       . Crestor [Rosuvastatin] Nausea And Vomiting  . Dexilant [Dexlansoprazole] Diarrhea  . Erythromycin Hives and Swelling  . Fosamax [Alendronate Sodium] Other (See Comments)    GI intolerance  . Macrobid [employer] Macro] Other (See Comments)    Pt does not remember the reaction  . Metformin And Related Nausea And Vomiting  . Nsaids     Elevated creatinine   . Penicillins Hives and Swelling    Has patient had a PCN reaction causing immediate rash, facial/tongue/throat swelling, SOB or lightheadedness with hypotension: Yes Has patient had a PCN reaction causing severe rash involving mucus membranes or skin necrosis: No Has patient had a PCN reaction that required hospitalization No Has patient had a PCN reaction occurring within the last 10 years: No If all of the above answers are "NO", then may proceed with Cephalosporin use.  . Tolmetin Other (See Comments)    Elevated creatinine   . Shellfish Allergy Nausea And Vomiting, Rash and Other (See Comments)    Felt spaced out    Objective: Tammie West is a pleasant 86 y.o. Caucasian female, WD, WN in NAD. AAO x 3.  There were no vitals filed for this visit. Vascular Examination: Neurovascular status unchanged b/l lower extremities. Capillary fill time to digits <3 seconds b/l lower extremities. Faintly palpable pedal pulses b/l. Pedal hair absent. Lower extremity skin temperature gradient within normal limits. Trace edema noted b/l lower extremities.  Dermatological Examination: Pedal skin is thin shiny, atrophic b/l lower extremities. No interdigital macerations bilaterally. Toenails 1-5 b/l elongated, discolored, dystrophic, thickened, crumbly with subungual debris and tenderness to dorsal palpation. Porokeratotic lesion(s) L 5th toe and R 5th toe. No erythema, no edema, no drainage, no flocculence.  Right 5th toe digit partial thickness 1.0 x 1.0 x  0.1 cm with tenderness to palpation and subdermal hemorrhage. No ischemia, no gangrene.  Musculoskeletal: Normal muscle strength 5/5 to all lower extremity muscle groups bilaterally. No pain crepitus or joint limitation noted with ROM b/l. 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  Neurological Examination: Protective sensation diminished with 10g monofilament b/l. Vibratory sensation diminished b/l.  Assessment: 1. Pre-ulcerative corn or callous   2. Diabetes mellitus type 2 with peripheral artery disease ([place])     Plan: -Examined patient. -As a courtesy, partial thickness ulcer pared right 5th digit. Antibiotic ointment applied. Applied spot band-aid and digital cushion. -Dispensed spot band-aid for daily use  for protection.Marland KitchenApply every morning. Remove every evening. -To prevent wound formation, she needs to be seen monthly to prevent wound formation. She has poor healing potential due to diabetes and PAD. -Patient to continue soft, supportive shoe gear daily. -Patient to report any pedal injuries to medical professional immediately. -She has healed left lateral malleolus ulcer with vascular intervention and outpatient wound care. -Patient/POA to call should there be question/concern in the interim.  Return in about 4 weeks (around 02/08/2020) for preulcerative lesions b/l 5th digits.  Marzetta Board, DPM

## 2020-01-25 ENCOUNTER — Other Ambulatory Visit: Payer: Self-pay

## 2020-01-25 DIAGNOSIS — I771 Stricture of artery: Secondary | ICD-10-CM

## 2020-02-06 ENCOUNTER — Ambulatory Visit: Payer: Medicare HMO | Admitting: Podiatry

## 2020-02-07 ENCOUNTER — Encounter: Payer: Self-pay | Admitting: Podiatry

## 2020-02-07 ENCOUNTER — Other Ambulatory Visit: Payer: Self-pay

## 2020-02-07 ENCOUNTER — Ambulatory Visit: Payer: Medicare HMO | Admitting: Podiatry

## 2020-02-07 DIAGNOSIS — M109 Gout, unspecified: Secondary | ICD-10-CM | POA: Insufficient documentation

## 2020-02-07 DIAGNOSIS — E559 Vitamin D deficiency, unspecified: Secondary | ICD-10-CM | POA: Insufficient documentation

## 2020-02-07 DIAGNOSIS — L84 Corns and callosities: Secondary | ICD-10-CM

## 2020-02-07 DIAGNOSIS — E11621 Type 2 diabetes mellitus with foot ulcer: Secondary | ICD-10-CM | POA: Insufficient documentation

## 2020-02-07 DIAGNOSIS — I509 Heart failure, unspecified: Secondary | ICD-10-CM | POA: Insufficient documentation

## 2020-02-07 DIAGNOSIS — E1151 Type 2 diabetes mellitus with diabetic peripheral angiopathy without gangrene: Secondary | ICD-10-CM | POA: Diagnosis not present

## 2020-02-07 DIAGNOSIS — E440 Moderate protein-calorie malnutrition: Secondary | ICD-10-CM | POA: Insufficient documentation

## 2020-02-07 DIAGNOSIS — Z794 Long term (current) use of insulin: Secondary | ICD-10-CM | POA: Insufficient documentation

## 2020-02-07 DIAGNOSIS — M810 Age-related osteoporosis without current pathological fracture: Secondary | ICD-10-CM | POA: Insufficient documentation

## 2020-02-07 DIAGNOSIS — F3340 Major depressive disorder, recurrent, in remission, unspecified: Secondary | ICD-10-CM | POA: Insufficient documentation

## 2020-02-07 DIAGNOSIS — E1165 Type 2 diabetes mellitus with hyperglycemia: Secondary | ICD-10-CM | POA: Insufficient documentation

## 2020-02-07 DIAGNOSIS — Z23 Encounter for immunization: Secondary | ICD-10-CM | POA: Insufficient documentation

## 2020-02-07 DIAGNOSIS — F39 Unspecified mood [affective] disorder: Secondary | ICD-10-CM | POA: Insufficient documentation

## 2020-02-07 DIAGNOSIS — G894 Chronic pain syndrome: Secondary | ICD-10-CM | POA: Insufficient documentation

## 2020-02-07 DIAGNOSIS — R630 Anorexia: Secondary | ICD-10-CM | POA: Insufficient documentation

## 2020-02-07 DIAGNOSIS — H900 Conductive hearing loss, bilateral: Secondary | ICD-10-CM | POA: Insufficient documentation

## 2020-02-07 DIAGNOSIS — F4322 Adjustment disorder with anxiety: Secondary | ICD-10-CM | POA: Insufficient documentation

## 2020-02-07 DIAGNOSIS — R209 Unspecified disturbances of skin sensation: Secondary | ICD-10-CM | POA: Insufficient documentation

## 2020-02-07 DIAGNOSIS — N184 Chronic kidney disease, stage 4 (severe): Secondary | ICD-10-CM | POA: Insufficient documentation

## 2020-02-07 DIAGNOSIS — E785 Hyperlipidemia, unspecified: Secondary | ICD-10-CM | POA: Insufficient documentation

## 2020-02-10 ENCOUNTER — Encounter (HOSPITAL_COMMUNITY): Payer: Medicare HMO

## 2020-02-10 ENCOUNTER — Encounter: Payer: Medicare HMO | Admitting: Vascular Surgery

## 2020-02-10 NOTE — Progress Notes (Signed)
Subjective: Tammie West presents today for follow up of painful right 5th digit lesion..She denies any redness, drainage or swelling.  She had her left subclavian artery stenosis addressed by Dr. [name] on last month. States she is recovering well.  She also has history of ulceration of left lateral malleolus which has healed as of 06/28/2019, with assistance of vascular intervention and outpatient Wound Care.   Her daughter is present during the visit.  [name], MD is patient's PCP. Last visit was 01/15/2020.  She is followed by Dr. [name] for PAD. Has h/o CLI and nonhealing left lateral malleolar wound which healed with vascular intervention and local wound care. Last ABI of LLE is 1.30 with widely patent stent.   Past Medical History:  Diagnosis Date  . Bilateral carpal tunnel syndrome 02/17/2017  . Carotid artery disease (HCC)    1-39% bilateral stenosis  . Carpal tunnel syndrome   . CKD (chronic kidney disease) stage 4, GFR 15-29 ml/min (HCC)   . Coronary artery disease 11/2014    cath with Prox RCA to Mid RCA lesion, 45% stenosed and Ost LAD to Prox LAD lesion, 30% stenosed byt recent cath and high calcium score by coronary CTA.  . Crohn's disease ([place])   . Depression   . Diabetes mellitus without complication ([place])   . GERD (gastroesophageal reflux disease)   . Goiter   . Hyperlipidemia   . Hypertension   . Hypothyroidism   . Insomnia   . Migraine   . Mitral regurgitation    mild to moderate by echo 04/2017  . Osteoarthritis   . Osteoporosis   . PVD (peripheral vascular disease) ([place])    -s/p stenting of the 99% left mid SFA - followed by Dr. [name]  . Trochanteric bursitis      Current Outpatient Medications on File Prior to Visit  Medication Sig Dispense Refill  . Accu-Chek FastClix Lancets MISC for use when checking blood sugars Dx E.11.9    . Blood Glucose Monitoring Suppl (ACCU-CHEK NANO SMARTVIEW) w/Device KIT See admin instructions.     . colchicine 0.6 MG tablet 1 tablet    . insulin lispro (HUMALOG KWIKPEN) 100 UNIT/ML KwikPen 4 units at beginning of each meal when blood sugar before that meal is above 200 mg/dL    . allopurinol (ZYLOPRIM) 100 MG tablet Take 1 tablet by mouth once daily for 30 days    . amitriptyline (ELAVIL) 25 MG tablet 1 tablet at bedtime.    Marland Kitchen amLODipine (NORVASC) 5 MG tablet Take 1 tablet by mouth once daily for 90 days    . aspirin EC 81 MG tablet 1 tablet    . Cholecalciferol (VITAMIN D) 50 MCG (2000 UT) tablet 1 tablet    . clidinium-chlordiazePOXIDE (LIBRAX) 5-2.5 MG capsule Take 1 capsule by mouth 2 (two) times daily.    . clopidogrel (PLAVIX) 75 MG tablet 1 tablet    . escitalopram (LEXAPRO) 20 MG tablet take 1 tablet by mouth once daily for 90 days    . furosemide (LASIX) 20 MG tablet 1 tablet    . Insulin Pen Needle (BD PEN NEEDLE NANO U/F) 32G X 4 MM MISC USE 1 TO 2 PEN NEEDLES DAILY WITH INSULIN INJECTIONS    . lidocaine (XYLOCAINE) 5 % ointment Apply 1 application topically 4 (four) times daily as needed for mild pain.     Marland Kitchen lisinopril (ZESTRIL) 5 MG tablet Take 1/2 tablet by mouth once daily for 90 days    .  mesalamine (LIALDA) 1.2 g EC tablet 2 tablets with a meal    . metoprolol succinate (TOPROL-XL) 50 MG 24 hr tablet Take 50 mg by mouth at bedtime.    Marland Kitchen morphine (MS CONTIN) 15 MG 12 hr tablet Take 15 mg by mouth 2 (two) times daily as needed.    . nitroGLYCERIN (NITROSTAT) 0.4 MG SL tablet Place 1 tablet (0.4 mg total) under the tongue every 5 (five) minutes as needed for chest pain. 25 tablet 2  . omeprazole (PRILOSEC) 20 MG capsule 1 capsule 30 minutes before morning meal    . Oxycodone HCl 10 MG TABS Take 10 mg by mouth every 4 (four) hours as needed (breakthrough pain).     . polyethylene glycol (MIRALAX / GLYCOLAX) packet Take 17 g by mouth daily as needed for mild constipation.    . Probiotic Product (ALIGN) 4 MG CAPS Take 1 capsule by mouth daily after breakfast.     .  simvastatin (ZOCOR) 10 MG tablet 1 tablet in the evening     Current Facility-Administered Medications on File Prior to Visit  Medication Dose Route Frequency Provider Last Rate Last Admin  . sodium chloride flush (NS) 0.9 % injection 3 mL  3 mL Intravenous Q12H Lorretta Harp, MD         Allergies  Allergen Reactions  . Atorvastatin Nausea And Vomiting    Other reaction(s): vomiting  . Byetta 10 Mcg Pen [Exenatide] Nausea And Vomiting  . Cefaclor Other (See Comments)    Pt does not remember the reaction Other reaction(s): Unknown  . Codeine Nausea And Vomiting       . Dexlansoprazole Diarrhea    Other reaction(s): Diarrhea  . Erythromycin Hives and Swelling    Other reaction(s): Unknown  . Fosamax [Alendronate Sodium] Other (See Comments)    GI intolerance  . Ibuprofen     Other reaction(s): elevated creatinine  . Macrobid [employer] Macro] Other (See Comments)    Pt does not remember the reaction  . Metformin And Related Nausea And Vomiting  . Metformin Hcl     Other reaction(s): nausea  . Nsaids     Elevated creatinine   . Penicillin G     Other reaction(s): Unknown  . Penicillins Hives and Swelling    Has patient had a PCN reaction causing immediate rash, facial/tongue/throat swelling, SOB or lightheadedness with hypotension: Yes Has patient had a PCN reaction causing severe rash involving mucus membranes or skin necrosis: No Has patient had a PCN reaction that required hospitalization No Has patient had a PCN reaction occurring within the last 10 years: No If all of the above answers are "NO", then may proceed with Cephalosporin use.  . Rosuvastatin Nausea And Vomiting    Other reaction(s): nausea  . Shellfish-Derived Products     Other reaction(s): Unknown  . Tolmetin Other (See Comments)    Elevated creatinine   . Shellfish Allergy Nausea And Vomiting, Rash and Other (See Comments)    Felt spaced out    Objective: Tammie West is a  pleasant 87 y.o. Caucasian female, WD, WN in NAD. AAO x 3.  There were no vitals filed for this visit. Vascular Examination: Neurovascular status unchanged b/l lower extremities. Capillary fill time to digits <3 seconds b/l lower extremities. Faintly palpable pedal pulses b/l. Pedal hair absent. Lower extremity skin temperature gradient within normal limits. Trace edema noted b/l lower extremities.  Dermatological Examination: Pedal skin is thin shiny, atrophic b/l lower extremities.  No interdigital macerations bilaterally. Toenails 1-5 b/l elongated, discolored, dystrophic, thickened, crumbly with subungual debris and tenderness to dorsal palpation. Porokeratotic lesion(s) L 5th toe and R 5th toe. No erythema, no edema, no drainage, no flocculence.  Right 5th toe digit partial thickness 1.0 x 1.0 cm with tenderness to palpation and subdermal hemorrhage. No ischemia, no gangrene.  Musculoskeletal: Normal muscle strength 5/5 to all lower extremity muscle groups bilaterally. No pain crepitus or joint limitation noted with ROM b/l. 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  Neurological Examination: Protective sensation diminished with 10g monofilament b/l. Vibratory sensation diminished b/l.  Assessment: 1. Diabetes mellitus type 2 with peripheral artery disease ([place])   2. Pre-ulcerative corn or callous     Plan: -Examined patient. -Preulcerative lesion pared right 5th digit. Antibiotic ointment applied. Applied spot band-aid and digital cushion. -Dispensed spot band-aid for daily use for protection. Apply every morning. Remove every evening. -To prevent wound formation, she needs to be seen monthly to prevent wound formation. She has poor healing potential due to diabetes and PAD. -Patient to continue soft, supportive shoe gear daily. -Patient to report any pedal injuries to medical professional immediately. -She has healed left lateral malleolus ulcer with vascular intervention and outpatient  wound care. -Patient/POA to call should there be question/concern in the interim.  Return in about 1 month (around 03/09/2020).  Marzetta Board, DPM

## 2020-02-15 ENCOUNTER — Telehealth: Payer: Self-pay

## 2020-02-15 NOTE — Telephone Encounter (Signed)
Patient's daughter called - patient has been having a slight stinging pain in her left arm above the elbow in the muscle for the past 4 days. She is s/p left subclavian bypass on 12/16. She had not been doing much activity, and then baked 3 different desserts. This is roughly when the pain started. She denies numbness and is able to move the arm - says the pain is not too bad and comes and goes. Daughter is concerned the weather will prevent them from coming to 02/17/20 appt. Advised her to keep appt if she can and keep an eye on patient symptoms. If they worsen, she develops numbness, can't move her arm - etc to call the office. Verbalizes understanding.

## 2020-02-17 ENCOUNTER — Encounter (HOSPITAL_COMMUNITY): Payer: Self-pay

## 2020-02-17 ENCOUNTER — Encounter: Payer: Self-pay | Admitting: Vascular Surgery

## 2020-03-02 ENCOUNTER — Encounter: Payer: Self-pay | Admitting: Vascular Surgery

## 2020-03-02 ENCOUNTER — Ambulatory Visit (HOSPITAL_COMMUNITY)
Admission: RE | Admit: 2020-03-02 | Discharge: 2020-03-02 | Disposition: A | Discharge status: Home / Self Care | Payer: Medicare HMO | Source: Ambulatory Visit | Attending: Vascular Surgery | Admitting: Vascular Surgery

## 2020-03-02 ENCOUNTER — Ambulatory Visit (INDEPENDENT_AMBULATORY_CARE_PROVIDER_SITE_OTHER): Payer: Medicare HMO | Admitting: Vascular Surgery

## 2020-03-02 ENCOUNTER — Other Ambulatory Visit: Payer: Self-pay

## 2020-03-02 VITALS — BP 168/73 | Temp 97.8°F | Resp 20 | Ht 66.0 in | Wt 100.0 lb

## 2020-03-02 DIAGNOSIS — I771 Stricture of artery: Secondary | ICD-10-CM

## 2020-03-02 NOTE — Progress Notes (Signed)
    Subjective:     Patient ID: Tammie West, female   DOB: 14-Jul-1932, 87 y.o.   MRN: [id number]  HPI 87 year old female status post left common carotid subclavian artery bypass with PTFE.  This was done for subclavian artery stenosis that was symptomatic.  She had a cold hand was also having dizzy spells with near drop attacks.  Since that time she has had no more of this.  She has had some burning in her left shoulder but that resolved about last week.  She has no new complaints related to today's visit.   Review of Systems No complaints today    Objective:   Physical Exam Vitals:   03/02/20 1534  BP: (!) 168/73  Resp: 20  Temp: 97.8 F (36.6 C)  SpO2: 94%  Awake alert oriented Left neck incision healing well There is a bruit over the left supraclavicular area 2+ left radial pulse   Carotid duplex Summary:    Left Carotid: Patent left CCA-subclavian artery bypass graft without  evidence of        stenosis. Velocities in the left ICA are consistent with a  1-39%        stenosis. Elevated peak systolic velocities noted throughout  the        left common carotid artery with minimal disease observed.     Assessment:     87 year old female status post left common carotid artery to subclavian artery bypass with 6 mm PTFE.  All symptoms have resolved.  There is some elevated velocities throughout the graft but no evidence of stenosis.    Plan:     Follow-up in 6 to 9 months with repeat carotid duplex.  If duplex remains satisfactory we will release her to follow-up with Dr. [name] in the future.  Shilo Philipson C. [name], MD [facility] Office: [phone] Pager: [phone]

## 2020-03-05 ENCOUNTER — Ambulatory Visit: Payer: Medicare HMO | Admitting: Podiatry

## 2020-03-06 ENCOUNTER — Other Ambulatory Visit: Payer: Self-pay

## 2020-03-06 DIAGNOSIS — I771 Stricture of artery: Secondary | ICD-10-CM

## 2020-03-27 ENCOUNTER — Ambulatory Visit: Payer: Medicare HMO | Admitting: Cardiovascular Disease

## 2020-03-27 ENCOUNTER — Encounter: Payer: Self-pay | Admitting: Cardiovascular Disease

## 2020-03-27 ENCOUNTER — Other Ambulatory Visit: Payer: Self-pay

## 2020-03-27 VITALS — BP 146/66 | HR 77 | Ht 64.0 in | Wt 107.0 lb

## 2020-03-27 DIAGNOSIS — I70229 Atherosclerosis of native arteries of extremities with rest pain, unspecified extremity: Secondary | ICD-10-CM

## 2020-03-27 DIAGNOSIS — I739 Peripheral vascular disease, unspecified: Secondary | ICD-10-CM

## 2020-03-27 DIAGNOSIS — E785 Hyperlipidemia, unspecified: Secondary | ICD-10-CM | POA: Diagnosis not present

## 2020-03-27 DIAGNOSIS — I771 Stricture of artery: Secondary | ICD-10-CM

## 2020-03-27 DIAGNOSIS — Z9889 Other specified postprocedural states: Secondary | ICD-10-CM

## 2020-03-27 DIAGNOSIS — I2510 Atherosclerotic heart disease of native coronary artery without angina pectoris: Secondary | ICD-10-CM | POA: Diagnosis not present

## 2020-03-27 DIAGNOSIS — Z720 Tobacco use: Secondary | ICD-10-CM

## 2020-03-27 DIAGNOSIS — I10 Essential (primary) hypertension: Secondary | ICD-10-CM | POA: Diagnosis not present

## 2020-03-27 NOTE — Progress Notes (Signed)
03/27/2020 [name]   [birth year]  [id number]  Primary Physician [name], MD Primary Cardiologist: Lorretta Harp MD [name], [place]  HPI:  Tammie West is a 87 y.o.   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 Jacqualyn Posey, her podiatrist for nonhealing ulcer on her left lateral malleolus.I last saw her in the office 12/27/2019. She has a history of treated hypertension, diabetes and hyperlipidemia. She does continue to smoke. She has noncritical CAD by cath. She has diastolic dysfunction as well. She has complained of left calf claudication. She developed a wound on her left lateral malleolus which has been getting worse despite aggressive local wound care with recent Dopplers that showed a high-frequency signal in her mid left SFA.  I performed peripheral angiography on her 04/04/2019 revealing a 99% mid left SFA stenosis with three-vessel runoff. I performed PTA and drug-eluting stenting with an Eluviadrug-eluting stent (6 mm x 40 mm). She had excellent angiographic result. Her Doppler studies normalized on 04/19/2019, her claudication resolved and her malleolar woundultimately healed.  Since I saw her in the office 6 months ago her wound is healed. She denies claudication. She was complaining of some dizzy episodes and had Doppler studies performed 05/18/2019 revealing high-grade left subclavian artery stenosis with disturbed left vertebral blood flow.  I ordered a CT angiogram on 12/02/2019 which confirmed high-grade left subclavian artery stenosis.   I performed subclavian angiography on her 12/19/2019 revealing a 95% stenosis just proximal to the takeoff of a large left vertebral artery making endovascular therapy high risk.  I did  review the angiogram with Dr. [name], vascular surgeon, who has agreed to perform left common carotid to left subclavian bypass on 01/12/2020.  I believe the patient is at low risk given her prior cardiac catheterization by Dr. Tamala Julian 12/13/2014 revealing minimal CAD.  She is otherwise asymptomatic.  She underwent elective left common carotid to subclavian bypass by Dr. [name] 01/12/2020 with an excellent result.  Her symptoms of subclavian steal have resolved.  She does continue to smoke.  She denies chest pain.  Her most recent lower extremity arterial Doppler studies performed 10/31/2019 revealed normal ABIs bilaterally with a widely patent left SFA stent.    Current Meds  Medication Sig  . Accu-Chek FastClix Lancets MISC for use when checking blood sugars Dx E.11.9  . allopurinol (ZYLOPRIM) 100 MG tablet Take 1 tablet by mouth once daily for 30 days  . amitriptyline (ELAVIL) 25 MG tablet 1 tablet at bedtime.  Marland Kitchen amLODipine (NORVASC) 5 MG tablet Take 1 tablet by mouth once daily for 90 days  . aspirin EC 81 MG tablet 1 tablet  . Blood Glucose Monitoring Suppl (ACCU-CHEK NANO SMARTVIEW) w/Device KIT See admin instructions.  . Cholecalciferol (VITAMIN D) 50 MCG (2000 UT) tablet 1 tablet  . clidinium-chlordiazePOXIDE (LIBRAX) 5-2.5 MG capsule Take 1 capsule by mouth 2 (two) times daily.  . colchicine 0.6 MG tablet 1 tablet  . escitalopram (LEXAPRO) 20 MG tablet take 1 tablet by mouth once daily for 90 days  . furosemide (LASIX) 20 MG tablet 1 tablet  . Insulin Pen Needle (BD PEN NEEDLE NANO U/F) 32G X 4 MM MISC USE 1 TO 2 PEN NEEDLES DAILY WITH INSULIN INJECTIONS  . lidocaine (XYLOCAINE) 5 % ointment Apply 1  application topically 4 (four) times daily as needed for mild pain.   Marland Kitchen lisinopril (ZESTRIL) 5 MG tablet Take 1/2 tablet by mouth once daily for 90 days  . mesalamine (LIALDA) 1.2 g EC tablet 2 tablets with a meal  . metoprolol succinate (TOPROL-XL) 50 MG 24 hr tablet Take 50 mg by mouth at  bedtime.  Marland Kitchen morphine (MS CONTIN) 15 MG 12 hr tablet Take 15 mg by mouth 2 (two) times daily as needed.  . nitroGLYCERIN (NITROSTAT) 0.4 MG SL tablet Place 1 tablet (0.4 mg total) under the tongue every 5 (five) minutes as needed for chest pain.  Marland Kitchen omeprazole (PRILOSEC) 20 MG capsule 1 capsule 30 minutes before morning meal  . Oxycodone HCl 10 MG TABS Take 10 mg by mouth every 4 (four) hours as needed (breakthrough pain).   . polyethylene glycol (MIRALAX / GLYCOLAX) packet Take 17 g by mouth daily as needed for mild constipation.  . Probiotic Product (ALIGN) 4 MG CAPS Take 1 capsule by mouth daily after breakfast.   . simvastatin (ZOCOR) 10 MG tablet 1 tablet in the evening  . [DISCONTINUED] clopidogrel (PLAVIX) 75 MG tablet 1 tablet  . [DISCONTINUED] insulin lispro (HUMALOG) 100 UNIT/ML KwikPen 4 units at beginning of each meal when blood sugar before that meal is above 200 mg/dL   Current Facility-Administered Medications for the 03/27/20 encounter (Office Visit) with Lorretta Harp, MD  Medication  . sodium chloride flush (NS) 0.9 % injection 3 mL     Allergies  Allergen Reactions  . Atorvastatin Nausea And Vomiting    Other reaction(s): vomiting  . Byetta 10 Mcg Pen [Exenatide] Nausea And Vomiting  . Cefaclor Other (See Comments)    Pt does not remember the reaction Other reaction(s): Unknown  . Codeine Nausea And Vomiting       . Dexlansoprazole Diarrhea    Other reaction(s): Diarrhea  . Erythromycin Hives and Swelling    Other reaction(s): Unknown  . Fosamax [Alendronate Sodium] Other (See Comments)    GI intolerance  . Ibuprofen     Other reaction(s): elevated creatinine  . Macrobid [employer] Macro] Other (See Comments)    Pt does not remember the reaction  . Metformin And Related Nausea And Vomiting  . Metformin Hcl     Other reaction(s): nausea  . Nsaids     Elevated creatinine   . Penicillin G     Other reaction(s): Unknown  . Penicillins Hives and  Swelling    Has patient had a PCN reaction causing immediate rash, facial/tongue/throat swelling, SOB or lightheadedness with hypotension: Yes Has patient had a PCN reaction causing severe rash involving mucus membranes or skin necrosis: No Has patient had a PCN reaction that required hospitalization No Has patient had a PCN reaction occurring within the last 10 years: No If all of the above answers are "NO", then may proceed with Cephalosporin use.  . Rosuvastatin Nausea And Vomiting    Other reaction(s): nausea  . Shellfish-Derived Products     Other reaction(s): Unknown  . Tolmetin Other (See Comments)    Elevated creatinine   . Shellfish Allergy Nausea And Vomiting, Rash and Other (See Comments)    Felt spaced out    Social History   Socioeconomic History  . Marital status: Married    Spouse name: Not on file  . Number of children: Not on file  . Years of education: Not on file  . Highest education level: Not on file  Occupational History  .  Not on file  Tobacco Use  . Smoking status: Current Some Day Smoker    Packs/day: 0.25    Types: Cigarettes  . Smokeless tobacco: Never Used  . Tobacco comment: per patient: smokes about 2-3 cigarettes per  day  Vaping Use  . Vaping Use: Never used  Substance and Sexual Activity  . Alcohol use: No  . Drug use: No  . Sexual activity: Not on file  Other Topics Concern  . Not on file  Social History Narrative  . Not on file   Social Determinants of Health   Financial Resource Strain: Not on file  Food Insecurity: Not on file  Transportation Needs: Not on file  Physical Activity: Not on file  Stress: Not on file  Social Connections: Not on file  Intimate Partner Violence: Not on file     Review of Systems: General: negative for chills, fever, night sweats or weight changes.  Cardiovascular: negative for chest pain, dyspnea on exertion, edema, orthopnea, palpitations, paroxysmal nocturnal dyspnea or shortness of  breath Dermatological: negative for rash Respiratory: negative for cough or wheezing Urologic: negative for hematuria Abdominal: negative for nausea, vomiting, diarrhea, bright red blood per rectum, melena, or hematemesis Neurologic: negative for visual changes, syncope, or dizziness All other systems reviewed and are otherwise negative except as noted above.    Blood pressure (!) 146/66, pulse 77, height 5' 4"  (1.626 m), weight 107 lb (48.5 kg), SpO2 97 %.  General appearance: alert and no distress Neck: no adenopathy, no carotid bruit, no JVD, supple, symmetrical, trachea midline and thyroid not enlarged, symmetric, no tenderness/mass/nodules Lungs: clear to auscultation bilaterally Heart: regular rate and rhythm, S1, S2 normal, no murmur, click, rub or gallop Extremities: extremities normal, atraumatic, no cyanosis or edema Pulses: 2+ and symmetric Skin: Skin color, texture, turgor normal. No rashes or lesions Neurologic: Alert and oriented X 3, normal strength and tone. Normal symmetric reflexes. Normal coordination and gait  EKG not performed today  ASSESSMENT AND PLAN:   Hypertension History of essential hypertension a blood pressure measured today 144/66.  She is on lisinopril and metoprolol.  Hyperlipidemia with target LDL less than 70 History of hyperlipidemia on statin therapy with lipid profile performed 10/28/2019 revealing total cholesterol 106, LDL 30 and HDL 62.  CAD (coronary artery disease), native coronary artery History of noncritical CAD by cardiac catheterization performed by Dr. Tamala Julian 12/13/2014.  Critical limb ischemia with history of revascularization of same extremity History of critical limb ischemia with a nonhealing ulcer on her left lateral malleolus status post left SFA PTA and drug-eluting stenting by myself 04/04/2019.  Follow-up Dopplers performed 2 weeks later showed normalization in her wound ultimately healed.  Stenosis of left subclavian artery  (HCC) History of symptomatic left subclavian stenosis with subclavian steal physiology status post angiography by myself 12/19/2019 revealing 95% proximal left subclavian artery stenosis just proximal to a large left vertebral artery making percutaneous revascularization high risk.  I ultimately sent her to Dr. [name] who performed left common carotid to subclavian bypass surgery 08/65/7846 without complication.  Her symptoms have completely resolved.  Tobacco abuse Ongoing tobacco abuse recalcitrant to receptor modification.      Lorretta Harp MD FACP,FACC,FAHA, [facility] 03/27/2020 3:20 PM

## 2020-03-27 NOTE — Assessment & Plan Note (Signed)
Ongoing tobacco abuse recalcitrant to receptor modification.

## 2020-03-27 NOTE — Assessment & Plan Note (Signed)
History of symptomatic left subclavian stenosis with subclavian steal physiology status post angiography by myself 12/19/2019 revealing 95% proximal left subclavian artery stenosis just proximal to a large left vertebral artery making percutaneous revascularization high risk.  I ultimately sent her to Dr. [name] who performed left common carotid to subclavian bypass surgery [id number]/95/8441 without complication.  Her symptoms have completely resolved.

## 2020-03-27 NOTE — Assessment & Plan Note (Signed)
History of hyperlipidemia on statin therapy with lipid profile performed 10/28/2019 revealing total cholesterol 106, LDL 30 and HDL 62.

## 2020-03-27 NOTE — Patient Instructions (Signed)
Medication Instructions:   -Stop taking plavix  *If you need a refill on your cardiac medications before your next appointment, please call your pharmacy*   Testing/Procedures: Your physician has requested that you have a lower extremity arterial duplex. This test is an ultrasound of the arteries in the legs. It looks at arterial blood flow in the legs. Allow one hour for Lower Arterial scans. There are no restrictions or special instructions This procedure is done at [facility]. 2nd Floor. To be done in Feb 2023    Follow-Up: At [facility], you and your health needs are our priority.  As part of our continuing mission to provide you with exceptional heart care, we have created designated Provider Care Teams.  These Care Teams include your primary Cardiologist (physician) and Advanced Practice Providers (APPs -  Physician Assistants and Nurse Practitioners) who all work together to provide you with the care you need, when you need it.  We recommend signing up for the patient portal called "MyChart".  Sign up information is provided on this After Visit Summary.  MyChart is used to connect with patients for Virtual Visits (Telemedicine).  Patients are able to view lab/test results, encounter notes, upcoming appointments, etc.  Non-urgent messages can be sent to your provider as well.   To learn more about what you can do with MyChart, go to [email or web address].    Your next appointment:   12 month(s)  The format for your next appointment:   In Person  Provider:   Quay Burow, MD

## 2020-03-27 NOTE — Assessment & Plan Note (Signed)
History of noncritical CAD by cardiac catheterization performed by Dr. Tamala Julian 12/13/2014.

## 2020-03-27 NOTE — Assessment & Plan Note (Signed)
History of essential hypertension a blood pressure measured today 144/66.  She is on lisinopril and metoprolol.

## 2020-03-27 NOTE — Assessment & Plan Note (Signed)
History of critical limb ischemia with a nonhealing ulcer on her left lateral malleolus status post left SFA PTA and drug-eluting stenting by myself 04/04/2019.  Follow-up Dopplers performed 2 weeks later showed normalization in her wound ultimately healed.

## 2020-04-06 ENCOUNTER — Ambulatory Visit (INDEPENDENT_AMBULATORY_CARE_PROVIDER_SITE_OTHER): Payer: Medicare HMO | Admitting: Podiatry

## 2020-04-06 ENCOUNTER — Other Ambulatory Visit: Payer: Self-pay

## 2020-04-06 ENCOUNTER — Encounter: Payer: Self-pay | Admitting: Podiatry

## 2020-04-06 DIAGNOSIS — Q828 Other specified congenital malformations of skin: Secondary | ICD-10-CM

## 2020-04-06 DIAGNOSIS — E119 Type 2 diabetes mellitus without complications: Secondary | ICD-10-CM

## 2020-04-06 DIAGNOSIS — E1151 Type 2 diabetes mellitus with diabetic peripheral angiopathy without gangrene: Secondary | ICD-10-CM

## 2020-04-06 DIAGNOSIS — M2042 Other hammer toe(s) (acquired), left foot: Secondary | ICD-10-CM

## 2020-04-06 DIAGNOSIS — M2041 Other hammer toe(s) (acquired), right foot: Secondary | ICD-10-CM | POA: Diagnosis not present

## 2020-04-06 NOTE — Progress Notes (Signed)
ANNUAL DIABETIC FOOT EXAM  Subjective: Tammie West presents today for for annual diabetic foot examination.  Patient relates 37 year h/o diabetes.  Patient relates h/o left ankle wound which has healed. She states she still sleeps on the left side.  Patient denies symptoms of foot numbness.  Patient denies symptoms of foot tingling.  Patient denies symptoms of burning in feet.  Patient's blood sugar was 100 mg/dl this morning.   [name], MD is patient's PCP. Last visit was 04/05/2020.  Past Medical History:  Diagnosis Date  . Bilateral carpal tunnel syndrome 02/17/2017  . Carotid artery disease (HCC)    1-39% bilateral stenosis  . Carpal tunnel syndrome   . CKD (chronic kidney disease) stage 4, GFR 15-29 ml/min (HCC)   . Coronary artery disease 11/2014    cath with Prox RCA to Mid RCA lesion, 45% stenosed and Ost LAD to Prox LAD lesion, 30% stenosed byt recent cath and high calcium score by coronary CTA.  . Crohn's disease ([place])   . Depression   . Diabetes mellitus without complication ([place])   . GERD (gastroesophageal reflux disease)   . Goiter   . Hyperlipidemia   . Hypertension   . Hypothyroidism   . Insomnia   . Migraine   . Mitral regurgitation    mild to moderate by echo 04/2017  . Osteoarthritis   . Osteoporosis   . PVD (peripheral vascular disease) ([place])    -s/p stenting of the 99% left mid SFA - followed by Dr. [name]  . Trochanteric bursitis    Patient Active Problem List   Diagnosis Date Noted  . Adjustment disorder with anxious mood 02/07/2020  . 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 Age-related osteoporosis without current pathological fracture 02/07/2020  . Chronic kidney disease, stage 4 (severe) ([place]) 02/07/2020  . Chronic pain syndrome 02/07/2020  . Conductive hearing loss, bilateral 02/07/2020  . Diabetic foot ulcer ([place]) 02/07/2020  . Dyslipidemia 02/07/2020  . Encounter for immunization 02/07/2020  . Gout 02/07/2020   . Heart failure ([place]) 02/07/2020  . Hyperglycemia due to type 2 diabetes mellitus ([place]) 02/07/2020  . Long term (current) use of insulin ([place]) 02/07/2020  . Loss of appetite 02/07/2020  . Malnutrition of moderate degree Altamease Oiler: 60% to less than 75% of standard weight) ([place]) 02/07/2020  . Recurrent major depression in remission ([place]) 02/07/2020  . Skin sensation disturbance 02/07/2020  . Vitamin D deficiency 02/07/2020  . Subclavian artery stenosis, left ([place]) 01/12/2020  . Stenosis of left subclavian artery ([place]) 11/15/2019  . Carotid artery disease ([place])   . PVD (peripheral vascular disease) ([place])   . Critical limb ischemia with history of revascularization of same extremity ([place]) 03/31/2019  . Pain in right hand 05/29/2017  . Pain of left hand 04/21/2017  . Bilateral carpal tunnel syndrome 02/17/2017  . Mitral regurgitation 01/18/2017  . Chronic idiopathic constipation 12/16/2016  . Intestinal gas excretion 12/16/2016  . Chronic diastolic CHF (congestive heart failure) ([place]) 07/18/2016  . Bilateral lower extremity edema 05/05/2016  . Laryngopharyngeal reflux (LPR) 06/29/2015  . Referred otalgia of left ear 06/29/2015  . Angina effort   . CAD (coronary artery disease), native coronary artery 11/28/2014  . Tobacco abuse 09/11/2014  . Chest pain 02/23/2013  . Diabetes mellitus without complication ([place])   . Hypertension   . Crohn's disease ([place])   . Insomnia   . Hypothyroidism   . Goiter   . Osteoporosis   . Hyperlipidemia with target LDL less than 70   .  Depression   . Carpal tunnel syndrome   . Renal failure   . GERD (gastroesophageal reflux disease)   . Osteoarthritis   . Trochanteric bursitis   . Migraine    Past Surgical History:  Procedure Laterality Date  . ABDOMINAL HYSTERECTOMY    . APPENDECTOMY    . CARDIAC CATHETERIZATION N/A 12/13/2014   Procedure: Left Heart Cath and Coronary Angiography;  Surgeon: Belva Crome, MD;  Location: [facility];   Service: Cardiovascular;  Laterality: N/A;  . CAROTID-SUBCLAVIAN BYPASS GRAFT Left 01/12/2020   Procedure: LEFT COMMON CAROTID-SUBCLAVIAN ARTERY BYPASS WITH GRAFT;  Surgeon: Waynetta Sandy, MD;  Location: [facility];  Service: Vascular;  Laterality: Left;  . CATARACT EXTRACTION W/ INTRAOCULAR LENS  IMPLANT, BILATERAL Bilateral   . LOWER EXTREMITY ANGIOGRAPHY Left 04/04/2019   Procedure: LOWER EXTREMITY ANGIOGRAPHY;  Surgeon: Lorretta Harp, MD;  Location: [facility];  Service: Cardiovascular;  Laterality: Left;  . PERIPHERAL VASCULAR INTERVENTION Left 04/04/2019   Procedure: PERIPHERAL VASCULAR INTERVENTION;  Surgeon: Lorretta Harp, MD;  Location: [facility];  Service: Cardiovascular;  Laterality: Left;  . UPPER EXTREMITY ANGIOGRAPHY N/A 12/19/2019   Procedure: UPPER EXTREMITY ANGIOGRAPHY;  Surgeon: Lorretta Harp, MD;  Location: [facility];  Service: Cardiovascular;  Laterality: N/A;   Current Outpatient Medications on File Prior to Visit  Medication Sig Dispense Refill  . Accu-Chek FastClix Lancets MISC for use when checking blood sugars Dx E.11.9    . allopurinol (ZYLOPRIM) 100 MG tablet Take 1 tablet by mouth once daily for 30 days    . amitriptyline (ELAVIL) 25 MG tablet 1 tablet at bedtime.    Marland Kitchen amLODipine (NORVASC) 5 MG tablet Take 1 tablet by mouth once daily for 90 days    . aspirin EC 81 MG tablet 1 tablet    . Blood Glucose Monitoring Suppl (ACCU-CHEK NANO SMARTVIEW) w/Device KIT See admin instructions.    . Cholecalciferol (VITAMIN D) 50 MCG (2000 UT) tablet 1 tablet    . clidinium-chlordiazePOXIDE (LIBRAX) 5-2.5 MG capsule Take 1 capsule by mouth 2 (two) times daily.    . clopidogrel (PLAVIX) 75 MG tablet 1 tablet    . colchicine 0.6 MG tablet 1 tablet    . escitalopram (LEXAPRO) 20 MG tablet take 1 tablet by mouth once daily for 90 days    . furosemide (LASIX) 20 MG tablet 1 tablet    . Insulin Pen Needle (BD PEN NEEDLE NANO U/F) 32G X 4 MM  MISC USE 1 TO 2 PEN NEEDLES DAILY WITH INSULIN INJECTIONS    . levothyroxine (EUTHYROX) 88 MCG tablet 1 tablet in the morning on an empty stomach    . levothyroxine (SYNTHROID) 100 MCG tablet     . levothyroxine (SYNTHROID) 88 MCG tablet     . lidocaine (XYLOCAINE) 5 % ointment 1 application as needed    . lisinopril (ZESTRIL) 5 MG tablet 1 tablet    . mesalamine (LIALDA) 1.2 g EC tablet 2 tablets with a meal    . metoprolol succinate (TOPROL-XL) 50 MG 24 hr tablet Take 50 mg by mouth at bedtime.    Marland Kitchen morphine (MS CONTIN) 15 MG 12 hr tablet Take 15 mg by mouth 2 (two) times daily as needed.    . naloxone (NARCAN) nasal spray 4 mg/0.1 mL 1 GRAM AS NEEDED AS NEEDED    . nitroGLYCERIN (NITROSTAT) 0.4 MG SL tablet Place 1 tablet (0.4 mg total) under the tongue every 5 (  five) minutes as needed for chest pain. 25 tablet 2  . omeprazole (PRILOSEC) 20 MG capsule 1 capsule 30 minutes before morning meal    . Oxycodone HCl 10 MG TABS Take 10 mg by mouth every 4 (four) hours as needed (breakthrough pain).     . polyethylene glycol (MIRALAX / GLYCOLAX) packet Take 17 g by mouth daily as needed for mild constipation.    . Probiotic Product (ALIGN) 4 MG CAPS Take 1 capsule by mouth daily after breakfast.     . simvastatin (ZOCOR) 10 MG tablet 1 tablet in the evening     Current Facility-Administered Medications on File Prior to Visit  Medication Dose Route Frequency Provider Last Rate Last Admin  . sodium chloride flush (NS) 0.9 % injection 3 mL  3 mL Intravenous Q12H Lorretta Harp, MD        Allergies  Allergen Reactions  . Atorvastatin Nausea And Vomiting    Other reaction(s): vomiting  . Byetta 10 Mcg Pen [Exenatide] Nausea And Vomiting  . Cefaclor Other (See Comments)    Pt does not remember the reaction Other reaction(s): Unknown  . Codeine Nausea And Vomiting       . Dexlansoprazole Diarrhea    Other reaction(s): Diarrhea  . Erythromycin Hives and Swelling    Other reaction(s): Unknown   . Fosamax [Alendronate Sodium] Other (See Comments)    GI intolerance  . Fosamax [Alendronate]     Other reaction(s): GI intolerance  . Ibuprofen     Other reaction(s): elevated creatinine  . Macrobid [employer] Macro] Other (See Comments)    Pt does not remember the reaction  . Metformin And Related Nausea And Vomiting  . Metformin Hcl     Other reaction(s): nausea  . Nsaids     Elevated creatinine   . Penicillin G     Other reaction(s): Unknown  . Penicillins Hives and Swelling    Has patient had a PCN reaction causing immediate rash, facial/tongue/throat swelling, SOB or lightheadedness with hypotension: Yes Has patient had a PCN reaction causing severe rash involving mucus membranes or skin necrosis: No Has patient had a PCN reaction that required hospitalization No Has patient had a PCN reaction occurring within the last 10 years: No If all of the above answers are "NO", then may proceed with Cephalosporin use.  . Rosuvastatin Nausea And Vomiting    Other reaction(s): nausea Other reaction(s): nausea  . Shellfish-Derived Products     Other reaction(s): Unknown  . Tolmetin Other (See Comments)    Elevated creatinine   . Shellfish Allergy Nausea And Vomiting, Rash and Other (See Comments)    Felt spaced out   Social History   Occupational History  . Not on file  Tobacco Use  . Smoking status: Current Some Day Smoker    Packs/day: 0.25    Types: Cigarettes  . Smokeless tobacco: Never Used  . Tobacco comment: per patient: smokes about 2-3 cigarettes per  day  Vaping Use  . Vaping Use: Never used  Substance and Sexual Activity  . Alcohol use: No  . Drug use: No  . Sexual activity: Not on file   Family History  Problem Relation Age of Onset  . Emphysema Father   . CAD Brother   . Heart disease Brother   . Heart attack Neg Hx   . Stroke Neg Hx    Immunization History  Administered Date(s) Administered  . Influenza-Unspecified 10/08/2015  .  Pneumococcal-Unspecified 08/17/2013  . Tdap  03/18/2016     Review of Systems: Negative except as noted in the HPI.  Objective: There were no vitals filed for this visit.  AIRI COPADO is a pleasant 87 y.o. female in NAD. AAO X 3.  Vascular Examination: Capillary fill time to digits <3 seconds b/l lower extremities. Faintly palpable DP pulse(s) b/l lower extremities. Nonpalpable PT pulse(s) b/l lower extremities. Pedal hair absent. Lower extremity skin temperature gradient within normal limits. No pain with calf compression b/l. Trace edema noted b/l lower extremities. No ischemia or gangrene noted b/l lower extremities.  Dermatological Examination: Pedal skin with normal turgor, texture and tone bilaterally. No open wounds bilaterally. No interdigital macerations bilaterally. Toenails 1-5 b/l well maintained with adequate length. No erythema, no edema, no drainage, no fluctuance. Porokeratotic lesion(s) R 5th toe. No erythema, no edema, no drainage, no fluctuance.   She does have area of erythema lateral malleolus. No fluctuance. No drainage.   Musculoskeletal Examination: Normal muscle strength 5/5 to all lower extremity muscle groups bilaterally. No pain crepitus or joint limitation noted with ROM b/l. Hammertoes noted to the L 5th toe and R 5th toe.  Footwear Assessment: Does the patient wear appropriate shoes? Yes. Does the patient need inserts/orthotics? No.  Neurological Examination: Protective sensation diminished with 10g monofilament b/l.  Hemoglobin A1C Latest Ref Rng & Units 01/05/2020  HGBA1C 4.8 - 5.6 % 7.5(H)  Some recent data might be hidden   Assessment: 1. Porokeratosis   2. Acquired hammertoes of both feet   3. Type II diabetes mellitus with peripheral circulatory disorder (HCC)   4. Encounter for diabetic foot exam ([place])     ADA Risk Categorization: High Risk  Patient has one or more of the following: Loss of protective sensation Absent pedal  pulses Severe Foot deformity History of foot ulcer  Plan: -Examined patient. -Diabetic foot examination performed on today's visit. -Patient to continue soft, supportive shoe gear daily. -She was advised to protect left ankle area daily. Report any changes to [facility] or Dr. Jacqualyn Posey. She and her daughter related understanding. -Painful porokeratotic lesion(s) R 5th toe pared and enucleated with sterile scalpel blade without incident. -Patient to report any pedal injuries to medical professional immediately. -Patient/POA to call should there be question/concern in the interim.  Return in about 6 weeks (around 05/18/2020).  Marzetta Board, DPM

## 2020-04-24 ENCOUNTER — Encounter: Payer: Self-pay | Admitting: Podiatry

## 2020-04-24 ENCOUNTER — Ambulatory Visit: Payer: Medicare HMO | Admitting: Podiatry

## 2020-04-24 ENCOUNTER — Other Ambulatory Visit: Payer: Self-pay

## 2020-04-24 ENCOUNTER — Telehealth: Payer: Self-pay | Admitting: *Deleted

## 2020-04-24 DIAGNOSIS — L02612 Cutaneous abscess of left foot: Secondary | ICD-10-CM

## 2020-04-24 DIAGNOSIS — L600 Ingrowing nail: Secondary | ICD-10-CM

## 2020-04-24 DIAGNOSIS — L03032 Cellulitis of left toe: Secondary | ICD-10-CM | POA: Diagnosis not present

## 2020-04-24 DIAGNOSIS — H05222 Edema of left orbit: Secondary | ICD-10-CM | POA: Insufficient documentation

## 2020-04-24 MED ORDER — DOXYCYCLINE HYCLATE 100 MG PO CAPS: 100.0000 mg | ORAL_CAPSULE | Freq: Two times a day (BID) | ORAL | 0 refills | Status: AC

## 2020-04-24 MED ORDER — MUPIROCIN 2 % EX OINT: TOPICAL_OINTMENT | CUTANEOUS | 1 refills | Status: AC

## 2020-04-24 NOTE — Telephone Encounter (Signed)
I see she's scheduled to see me this afternoon. Thanks.

## 2020-04-24 NOTE — Telephone Encounter (Signed)
We have her worked in ;-)

## 2020-04-24 NOTE — Patient Instructions (Signed)
EPSOM SALT FOOT SOAK INSTRUCTIONS  *IF YOU HAVE BEEN PRESCRIBED ANTIBIOTICS, TAKE AS INSTRUCTED UNTIL ALL ARE GONE*  Shopping List:  A. Plain epsom salt (not scented) B. Mupirocin Ointment  1.  Place 1/4 cup of epsom salts in 2 quarts of warm tap water. IF YOU ARE DIABETIC, OR HAVE NEUROPATHY, CHECK THE TEMPERATURE OF THE WATER WITH YOUR ELBOW.  2.  Submerge your foot/feet in the solution and soak for 10-15 minutes.      3.  Next, remove your foot/feet from solution, blot dry the affected area.    4.  Apply light amount of prescribed antibiotic ointment and cover with fabric band-aid .  5.  This soak should be done once a day for 7 days.   6.  Monitor for any signs/symptoms of infection such as redness, swelling, odor, drainage, increased pain, or non-healing of digit.   7.  Please do not hesitate to call the office and speak to a Nurse or Doctor if you have questions.   8.  If you experience fever, chills, nightsweats, nausea or vomiting with worsening of digit, please go to the emergency room.

## 2020-04-24 NOTE — Telephone Encounter (Signed)
Complete, patient being seen today.

## 2020-04-24 NOTE — Telephone Encounter (Addendum)
Patient's daughter([name]) is calling because her mother is having pain and swelling of the left foot,4th toe. It has been going on for 1 1/2 weeks and has been using betadine with no improvements. Please schedule appointment as soon as possible.

## 2020-04-24 NOTE — Telephone Encounter (Signed)
Thanks so much. 

## 2020-04-25 NOTE — Progress Notes (Signed)
Subjective: Tammie West presents today for with chief concern of painful left 4th toe. Daughter states toe became painful when she wore shoe gear. They suspect ingrown toenail.  Denies any drainage, but digit is swollen and red. Denies any fever, chills, night sweats nausea or vomiting.  Patient's blood sugar was 100 mg/dl this morning.   Tammie Cha, MD is patient's PCP. Last visit was 04/05/2020.  Objective: There were no vitals filed for this visit.  Tammie West is a pleasant 87 y.o. female in NAD. AAO X 3.  Vascular Examination: Capillary fill time to digits <3 seconds b/l lower extremities. Faintly palpable DP pulse(s) b/l lower extremities. Nonpalpable PT pulse(s) b/l lower extremities. Pedal hair absent. Lower extremity skin temperature gradient within normal limits. No pain with calf compression b/l. Trace edema noted b/l lower extremities. No ischemia or gangrene noted b/l lower extremities.  Dermatological Examination: Pedal skin with normal turgor, texture and tone bilaterally. Incurvated nailplate bilateral border(s) R hallux.  Nail border hypertrophy absent. There is tenderness to palpation. Sign(s) of infection: no clinical signs of infection noted on examination today.  Left 4th toe with subungual porokeratotic lesion lateral border of digit. There is erythema and edema distal to DIPJ. No abscess appreciated. +Tenderness to palpation.   Musculoskeletal Examination: Normal muscle strength 5/5 to all lower extremity muscle groups bilaterally. No pain crepitus or joint limitation noted with ROM b/l. Hammertoes noted to the L 5th toe and R 5th toe.  Neurological Examination: Protective sensation diminished with 10g monofilament b/l.  Hemoglobin A1C Latest Ref Rng & Units 01/05/2020  HGBA1C 4.8 - 5.6 % 7.5(H)  Some recent data might be hidden   Assessment: 1. Cellulitis and abscess of toe of left foot   2. Ingrown toenail without infection     Plan: -Examined patient. -For left 4th toe, Rx for Doxycycline 100 mg po bid x 7 days and Rx for Mupirocin Ointment to be applied to digit once daily. -Prescribed epsom salt soaks for right great toe. She may apply Mupirocin Ointment to digit once daily for one week. -Porokeratotic lesion pared and enucleated left 4th toe. Patient related left 4th toe felt better after putting on shoe gear.  -Patient to continue soft, supportive shoe gear daily. -She was advised to protect left ankle area daily. Report any changes to [facility] or Dr. Jacqualyn Posey. She and her daughter related understanding. -Patient to report any pedal injuries to medical professional immediately. -Patient/POA to call should there be question/concern in the interim. -She is keep previously scheduled follow up visit. Daughter to call if she has any additional problems. -Return in about 30 days (around 05/24/2020). She will see Dr. Sherryle Lis while I am out of the office.  Marzetta Board, DPM

## 2020-05-24 ENCOUNTER — Ambulatory Visit: Payer: Medicare HMO | Admitting: Podiatry

## 2020-05-24 ENCOUNTER — Other Ambulatory Visit: Payer: Self-pay

## 2020-05-24 DIAGNOSIS — M79671 Pain in right foot: Secondary | ICD-10-CM

## 2020-05-24 DIAGNOSIS — E1151 Type 2 diabetes mellitus with diabetic peripheral angiopathy without gangrene: Secondary | ICD-10-CM

## 2020-05-24 DIAGNOSIS — E1351 Other specified diabetes mellitus with diabetic peripheral angiopathy without gangrene: Secondary | ICD-10-CM

## 2020-05-24 DIAGNOSIS — L602 Onychogryphosis: Secondary | ICD-10-CM

## 2020-05-24 DIAGNOSIS — M79672 Pain in left foot: Secondary | ICD-10-CM | POA: Diagnosis not present

## 2020-05-24 DIAGNOSIS — L84 Corns and callosities: Secondary | ICD-10-CM

## 2020-05-24 NOTE — Progress Notes (Signed)
  Subjective:  Patient ID: Tammie West, female    DOB: [birth year],  MRN: [id number]    87 y.o. female presents with painful calluses and elongated toenails, the callus is bothersome on the left fourth and fifth and right fifth toe  Objective:  Physical Exam: warm, good capillary refill, no trophic changes or ulcerative lesions, weakly palpable pedal pulses and normal sensory exam.  Distal lateral corns present left fourth, fifth and right fifth toes.  Tender to palpation.  Elongated toenails. Assessment:  No diagnosis found.   Plan:  Patient was evaluated and treated and all questions answered.  All symptomatic hyperkeratoses were safely debrided with a sterile #15 blade to patient's level of comfort without incident. We discussed preventative and palliative care of these lesions including supportive and accommodative shoegear, padding, prefabricated and custom molded accommodative orthoses, use of a pumice stone and lotions/creams daily.  Recommend use urea cream and pumice stone  Nails were debrided in length to a comfortable level with a sharp nail nipper.  No follow-ups on file.

## 2020-06-11 ENCOUNTER — Other Ambulatory Visit: Payer: Self-pay | Admitting: Cardiology

## 2020-06-29 ENCOUNTER — Encounter: Payer: Self-pay | Admitting: Podiatry

## 2020-06-29 ENCOUNTER — Ambulatory Visit: Payer: Medicare HMO | Admitting: Podiatry

## 2020-06-29 ENCOUNTER — Other Ambulatory Visit: Payer: Self-pay

## 2020-06-29 DIAGNOSIS — L02611 Cutaneous abscess of right foot: Secondary | ICD-10-CM

## 2020-06-29 DIAGNOSIS — L03031 Cellulitis of right toe: Secondary | ICD-10-CM

## 2020-06-29 DIAGNOSIS — L600 Ingrowing nail: Secondary | ICD-10-CM | POA: Diagnosis not present

## 2020-06-29 DIAGNOSIS — E1151 Type 2 diabetes mellitus with diabetic peripheral angiopathy without gangrene: Secondary | ICD-10-CM

## 2020-06-29 DIAGNOSIS — Q828 Other specified congenital malformations of skin: Secondary | ICD-10-CM

## 2020-06-29 MED ORDER — DOXYCYCLINE HYCLATE 100 MG PO CAPS: 100.0000 mg | ORAL_CAPSULE | Freq: Two times a day (BID) | ORAL | 0 refills | Status: DC

## 2020-07-02 ENCOUNTER — Encounter: Payer: Self-pay | Admitting: Podiatry

## 2020-07-02 MED ORDER — DOXYCYCLINE HYCLATE 100 MG PO CAPS: 100.0000 mg | ORAL_CAPSULE | Freq: Two times a day (BID) | ORAL | 0 refills | Status: AC

## 2020-07-02 MED ORDER — DOXYCYCLINE HYCLATE 100 MG PO CAPS: 100.0000 mg | ORAL_CAPSULE | Freq: Two times a day (BID) | ORAL | 0 refills | Status: DC

## 2020-07-02 NOTE — Progress Notes (Signed)
Subjective:  Patient ID: Tammie West, female    DOB: May 04, 1932,  MRN: [id number]  87 y.o. female presents with at risk foot care. Pt has h/o NIDDM with PAD and ingrown toenail to the medial border of right foot for the past few days. She denies any drainage or swelling, but states toe is red. She denies any fever, chills, night sweats, nausea or vomiting.  Her daughter is present during today's visit. They state Dr. Sherryle Lis wrote prescription for cream for her to put on her painful porokeratoses and instructed them to use pumice stone with cream to keep lesion under control. Daughter states she has been applying cream to a little cream to some areas of dry skin as well. They state her lesions have not been as painful as before.  Patient's blood sugar was 215 mg/dl today.  PCP: [name], MD and last visit was: 04/04/2020.  Review of Systems: Negative except as noted in the HPI.   Allergies  Allergen Reactions  . Atorvastatin Nausea And Vomiting    Other reaction(s): vomiting Other reaction(s): vomiting  . Cefaclor Other (See Comments)    Pt does not remember the reaction Other reaction(s): Unknown Other reaction(s): Unknown  . Codeine Nausea And Vomiting       . Dexlansoprazole Diarrhea    Other reaction(s): Diarrhea Other reaction(s): Diarrhea  . Erythromycin Hives and Swelling    Other reaction(s): Unknown Other reaction(s): Unknown  . Exenatide Nausea And Vomiting    Other reaction(s): nausea  . Fosamax [Alendronate Sodium] Other (See Comments)    GI intolerance  . Fosamax [Alendronate]     Other reaction(s): GI intolerance  . Ibuprofen     Other reaction(s): elevated creatinine Other reaction(s): elevated creatinine  . Macrobid [employer] Macro] Other (See Comments)    Pt does not remember the reaction  . Metformin And Related Nausea And Vomiting  . Metformin Hcl     Other reaction(s): nausea Other reaction(s): nausea  .  Nitrofurantoin     Other reaction(s): Unknown  . Nsaids     Elevated creatinine   . Other     Other reaction(s): GI intolerance Other reaction(s): nausea  . Penicillin G     Other reaction(s): Unknown Other reaction(s): Unknown  . Penicillins Hives and Swelling    Has patient had a PCN reaction causing immediate rash, facial/tongue/throat swelling, SOB or lightheadedness with hypotension: Yes Has patient had a PCN reaction causing severe rash involving mucus membranes or skin necrosis: No Has patient had a PCN reaction that required hospitalization No Has patient had a PCN reaction occurring within the last 10 years: No If all of the above answers are "NO", then may proceed with Cephalosporin use.  . Rosuvastatin Nausea And Vomiting    Other reaction(s): nausea Other reaction(s): nausea  . Shellfish-Derived Products     Other reaction(s): Unknown Other reaction(s): Unknown  . Tolmetin Other (See Comments)    Elevated creatinine   . Shellfish Allergy Nausea And Vomiting, Rash and Other (See Comments)    Felt spaced out    Objective:  There were no vitals filed for this visit. Constitutional Patient is a pleasant 87 y.o. Caucasian female WD, WN in NAD. AAO x 3.  Vascular Capillary fill time to digits <3 seconds b/l lower extremities. Faintly palpable DP pulse(s) b/l lower extremities. Nonpalpable PT pulse(s) b/l lower extremities. Pedal hair absent. Lower extremity skin temperature gradient within normal limits. No pain with calf compression b/l. Trace edema noted b/l  lower extremities. No cyanosis or clubbing noted.  Neurologic Normal speech. Protective sensation diminished with 10g monofilament b/l.  Dermatologic Pedal skin with normal turgor, texture and tone bilaterally. No open wounds bilaterally. No interdigital macerations bilaterally. Toenails 1-5 b/l elongated, discolored, dystrophic, thickened, crumbly with subungual debris and tenderness to dorsal palpation. Incurvated  nailplate b/l borders border(s) of R hallux>L hallux.  Nail border hypertrophy minimal. There is tenderness to palpation. Sign(s) of infection: erythema and pain on palpation.  Orthopedic: Normal muscle strength 5/5 to all lower extremity muscle groups bilaterally. No pain crepitus or joint limitation noted with ROM b/l. Adductovarus deformity L 5th toe and R 5th toe.   Hemoglobin A1C Latest Ref Rng & Units 01/05/2020  HGBA1C 4.8 - 5.6 % 7.5(H)  Some recent data might be hidden   Assessment:   1. Cellulitis and abscess of toe of right foot   2. Ingrown toenail   3. Porokeratosis   4. Type II diabetes mellitus with peripheral circulatory disorder [facility])    Plan:  Patient was evaluated and treated and all questions answered.  Onychomycosis with pain -Nails palliatively debridement as below. -Educated on self-care  Procedure: Nail Debridement Rationale: Pain Type of Debridement: manual, sharp debridement. Instrumentation: Nail nipper, rotary burr. Number of Nails: 10  -Examined patient. -Patient to continue soft, supportive shoe gear daily. -Toenails 1-5 b/l were debrided in length and girth with sterile nail nippers and dremel without iatrogenic bleeding.  -Offending nail border debrided and curretaged L hallux and R hallux utilizing sterile nail nipper and currette. Border(s) cleansed with alcohol and triple antibiotic ointment applied. Patient instructed to apply mupirocin ointment to L hallux and R hallux once daily for 7 days. -She does have less symptoms with use of urea and pumice stone. Due to her PAD, we did discuss caution using Urea. It is only to be applied to porokeratotic lesions and not good skin as it could cause a wound. I advised that her daughter only be the one to apply the medication. They related understanding. -Patient to report any pedal injuries to medical professional immediately. -Rx for Doxycyline 100 mg, #14, to be taken one capsule twice daily for 7  days. -Patient/POA to call should there be question/concern in the interim.  Return in about 6 weeks (around 08/10/2020).  Marzetta Board, DPM

## 2020-07-03 ENCOUNTER — Other Ambulatory Visit: Payer: Self-pay

## 2020-07-03 ENCOUNTER — Telehealth: Payer: Self-pay

## 2020-07-04 ENCOUNTER — Telehealth: Payer: Self-pay

## 2020-07-04 NOTE — Telephone Encounter (Signed)
FYI

## 2020-07-04 NOTE — Telephone Encounter (Signed)
Error

## 2020-07-06 NOTE — Telephone Encounter (Signed)
Pt was seen by Dr. Elisha Ponder I was on call and covering. At this point if everything looks ok she probably does not need the doxy.

## 2020-08-15 ENCOUNTER — Encounter: Payer: Self-pay | Admitting: Podiatry

## 2020-08-15 ENCOUNTER — Other Ambulatory Visit: Payer: Self-pay

## 2020-08-15 ENCOUNTER — Ambulatory Visit: Payer: Medicare HMO | Admitting: Podiatry

## 2020-08-15 DIAGNOSIS — M79675 Pain in left toe(s): Secondary | ICD-10-CM

## 2020-08-15 DIAGNOSIS — E1151 Type 2 diabetes mellitus with diabetic peripheral angiopathy without gangrene: Secondary | ICD-10-CM | POA: Diagnosis not present

## 2020-08-15 DIAGNOSIS — B351 Tinea unguium: Secondary | ICD-10-CM

## 2020-08-15 DIAGNOSIS — M79674 Pain in right toe(s): Secondary | ICD-10-CM

## 2020-08-15 DIAGNOSIS — L600 Ingrowing nail: Secondary | ICD-10-CM

## 2020-08-15 DIAGNOSIS — R197 Diarrhea, unspecified: Secondary | ICD-10-CM | POA: Insufficient documentation

## 2020-08-18 NOTE — Progress Notes (Signed)
Subjective:  Patient ID: Tammie West, female    DOB: 1932-06-24,  MRN: [id number]  87 y.o. female presents with at risk foot care. Pt has h/o NIDDM with PAD, painful thick toenails that are difficult to trim. Pain interferes with ambulation. Aggravating factors include wearing enclosed shoe gear. Pain is relieved with periodic professional debridement.  She also has ingrown toenail to the bilateral borders of L hallux and R hallux. States they are tender today.   She is accompanied by her daughter on today's visit. Daughter states her dad has been applying the urea cream and gently filing the right 5th digit with a pumice stone and this has been working for her.  Patient's blood sugar was 120 mg/dl today.  PCP: Tammie Cha, MD and last visit was: one month ago.  Review of Systems: Negative except as noted in the HPI.   Allergies  Allergen Reactions   Atorvastatin Nausea And Vomiting    Other reaction(s): vomiting Other reaction(s): vomiting   Cefaclor Other (See Comments)    Pt does not remember the reaction Other reaction(s): Unknown Other reaction(s): Unknown   Codeine Nausea And Vomiting        Dexlansoprazole Diarrhea    Other reaction(s): Diarrhea Other reaction(s): Diarrhea   Erythromycin Hives and Swelling    Other reaction(s): Unknown Other reaction(s): Unknown   Exenatide Nausea And Vomiting    Other reaction(s): nausea   Fosamax [Alendronate Sodium] Other (See Comments)    GI intolerance   Fosamax [Alendronate]     Other reaction(s): GI intolerance   Ibuprofen     Other reaction(s): elevated creatinine Other reaction(s): elevated creatinine   Macrobid [Nitrofurantoin Monohyd Macro] Other (See Comments)    Pt does not remember the reaction   Metformin And Related Nausea And Vomiting   Metformin Hcl     Other reaction(s): nausea Other reaction(s): nausea   Nitrofurantoin     Other reaction(s): Unknown   Nsaids     Elevated creatinine     Other     Other reaction(s): GI intolerance Other reaction(s): nausea   Penicillin G     Other reaction(s): Unknown Other reaction(s): Unknown   Penicillins Hives and Swelling    Has patient had a PCN reaction causing immediate rash, facial/tongue/throat swelling, SOB or lightheadedness with hypotension: Yes Has patient had a PCN reaction causing severe rash involving mucus membranes or skin necrosis: No Has patient had a PCN reaction that required hospitalization No Has patient had a PCN reaction occurring within the last 10 years: No If all of the above answers are "NO", then may proceed with Cephalosporin use.   Rosuvastatin Nausea And Vomiting    Other reaction(s): nausea Other reaction(s): nausea   Shellfish-Derived Products     Other reaction(s): Unknown Other reaction(s): Unknown   Tolmetin Other (See Comments)    Elevated creatinine    Shellfish Allergy Nausea And Vomiting, Rash and Other (See Comments)    Felt spaced out    Objective:  There were no vitals filed for this visit. Constitutional Patient is a pleasant 87 y.o. Caucasian female WD, WN in NAD. AAO x 3.  Vascular Capillary fill time to digits <3 seconds b/l lower extremities. Faintly palpable DP pulse(s) b/l lower extremities. Nonpalpable PT pulse(s) b/l lower extremities. Pedal hair absent. Lower extremity skin temperature gradient within normal limits. Trace edema noted b/l lower extremities. No cyanosis or clubbing noted.  Neurologic Normal speech. Protective sensation diminished with 10g monofilament b/l.  Dermatologic Pedal skin  is thin shiny, atrophic b/l lower extremities. Toenails 1-5 b/l elongated, discolored, dystrophic, thickened, crumbly with subungual debris and tenderness to dorsal palpation. Incurvated nailplate medial and latera border(s) L hallux and R hallux.  Nail border hypertrophy absent. There is tenderness to palpation. Sign(s) of infection: no clinical signs of infection noted on examination  today..  Orthopedic: Normal muscle strength 5/5 to all lower extremity muscle groups bilaterally. 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 No pain crepitus or joint limitation noted with ROM b/l.   Hemoglobin A1C Latest Ref Rng & Units 01/05/2020  HGBA1C 4.8 - 5.6 % 7.5(H)  Some recent data might be hidden    Assessment:   1. Pain due to onychomycosis of toenails of both feet   2. Ingrown toenail   3. Type II diabetes mellitus with peripheral circulatory disorder [facility])    Plan:  Patient was evaluated and treated and all questions answered.  Onychomycosis with pain -Nails palliatively debridement as below. -Educated on self-care  Procedure: Nail Debridement Rationale: Pain Type of Debridement: manual, sharp debridement. Instrumentation: Nail nipper, rotary burr. Number of Nails: 10  -Examined patient. -Patient to continue soft, supportive shoe gear daily. -Toenails 1-5 b/l were debrided in length and girth with sterile nail nippers and dremel without iatrogenic bleeding.  -Offending nail border debrided and curretaged L hallux and R hallux utilizing sterile nail nipper and currette. Border(s) cleansed with alcohol and triple antibiotic ointment applied. Patient instructed to apply Neosporin to L hallux and R hallux once daily for 7 days. -Patient to report any pedal injuries to medical professional immediately. -Patient/POA to call should there be question/concern in the interim.  Return in about 3 months (around 11/15/2020).  Marzetta Board, DPM

## 2020-09-12 ENCOUNTER — Ambulatory Visit: Payer: Medicare HMO | Admitting: Podiatry

## 2020-10-31 ENCOUNTER — Ambulatory Visit (HOSPITAL_COMMUNITY)
Admission: RE | Admit: 2020-10-31 | Discharge: 2020-10-31 | Disposition: A | Discharge status: Home / Self Care | Payer: Medicare HMO | Source: Ambulatory Visit | Attending: Cardiovascular Disease | Admitting: Cardiovascular Disease

## 2020-10-31 ENCOUNTER — Other Ambulatory Visit: Payer: Self-pay

## 2020-10-31 DIAGNOSIS — I739 Peripheral vascular disease, unspecified: Secondary | ICD-10-CM | POA: Diagnosis present

## 2020-11-14 ENCOUNTER — Other Ambulatory Visit: Payer: Self-pay

## 2020-11-14 ENCOUNTER — Encounter (HOSPITAL_COMMUNITY): Payer: Self-pay

## 2020-11-14 ENCOUNTER — Emergency Department (HOSPITAL_COMMUNITY): Payer: Medicare HMO

## 2020-11-14 ENCOUNTER — Inpatient Hospital Stay (HOSPITAL_COMMUNITY)
Admission: EM | Admit: 2020-11-14 | Discharge: 2020-11-27 | DRG: 871 | Disposition: E | Payer: Medicare HMO | Attending: Internal Medicine | Admitting: Internal Medicine

## 2020-11-14 DIAGNOSIS — I340 Nonrheumatic mitral (valve) insufficiency: Secondary | ICD-10-CM | POA: Diagnosis present

## 2020-11-14 DIAGNOSIS — M810 Age-related osteoporosis without current pathological fracture: Secondary | ICD-10-CM | POA: Diagnosis present

## 2020-11-14 DIAGNOSIS — N179 Acute kidney failure, unspecified: Secondary | ICD-10-CM | POA: Diagnosis present

## 2020-11-14 DIAGNOSIS — Z90710 Acquired absence of both cervix and uterus: Secondary | ICD-10-CM

## 2020-11-14 DIAGNOSIS — I130 Hypertensive heart and chronic kidney disease with heart failure and stage 1 through stage 4 chronic kidney disease, or unspecified chronic kidney disease: Secondary | ICD-10-CM | POA: Diagnosis present

## 2020-11-14 DIAGNOSIS — E1122 Type 2 diabetes mellitus with diabetic chronic kidney disease: Secondary | ICD-10-CM | POA: Diagnosis present

## 2020-11-14 DIAGNOSIS — A419 Sepsis, unspecified organism: Secondary | ICD-10-CM

## 2020-11-14 DIAGNOSIS — Z886 Allergy status to analgesic agent status: Secondary | ICD-10-CM

## 2020-11-14 DIAGNOSIS — I9589 Other hypotension: Secondary | ICD-10-CM | POA: Diagnosis present

## 2020-11-14 DIAGNOSIS — I959 Hypotension, unspecified: Secondary | ICD-10-CM

## 2020-11-14 DIAGNOSIS — Z20822 Contact with and (suspected) exposure to covid-19: Secondary | ICD-10-CM | POA: Diagnosis present

## 2020-11-14 DIAGNOSIS — E8809 Other disorders of plasma-protein metabolism, not elsewhere classified: Secondary | ICD-10-CM | POA: Diagnosis present

## 2020-11-14 DIAGNOSIS — Z880 Allergy status to penicillin: Secondary | ICD-10-CM

## 2020-11-14 DIAGNOSIS — Z7982 Long term (current) use of aspirin: Secondary | ICD-10-CM

## 2020-11-14 DIAGNOSIS — Z881 Allergy status to other antibiotic agents status: Secondary | ICD-10-CM

## 2020-11-14 DIAGNOSIS — Z79891 Long term (current) use of opiate analgesic: Secondary | ICD-10-CM

## 2020-11-14 DIAGNOSIS — E039 Hypothyroidism, unspecified: Secondary | ICD-10-CM | POA: Diagnosis present

## 2020-11-14 DIAGNOSIS — J189 Pneumonia, unspecified organism: Secondary | ICD-10-CM

## 2020-11-14 DIAGNOSIS — Z681 Body mass index (BMI) 19 or less, adult: Secondary | ICD-10-CM

## 2020-11-14 DIAGNOSIS — R7401 Elevation of levels of liver transaminase levels: Secondary | ICD-10-CM | POA: Diagnosis present

## 2020-11-14 DIAGNOSIS — G894 Chronic pain syndrome: Secondary | ICD-10-CM | POA: Diagnosis present

## 2020-11-14 DIAGNOSIS — K644 Residual hemorrhoidal skin tags: Secondary | ICD-10-CM | POA: Diagnosis present

## 2020-11-14 DIAGNOSIS — H900 Conductive hearing loss, bilateral: Secondary | ICD-10-CM | POA: Diagnosis present

## 2020-11-14 DIAGNOSIS — K5641 Fecal impaction: Secondary | ICD-10-CM | POA: Diagnosis not present

## 2020-11-14 DIAGNOSIS — E1151 Type 2 diabetes mellitus with diabetic peripheral angiopathy without gangrene: Secondary | ICD-10-CM | POA: Diagnosis present

## 2020-11-14 DIAGNOSIS — R55 Syncope and collapse: Secondary | ICD-10-CM | POA: Diagnosis not present

## 2020-11-14 DIAGNOSIS — K219 Gastro-esophageal reflux disease without esophagitis: Secondary | ICD-10-CM | POA: Diagnosis present

## 2020-11-14 DIAGNOSIS — I2510 Atherosclerotic heart disease of native coronary artery without angina pectoris: Secondary | ICD-10-CM | POA: Diagnosis present

## 2020-11-14 DIAGNOSIS — K5090 Crohn's disease, unspecified, without complications: Secondary | ICD-10-CM | POA: Diagnosis present

## 2020-11-14 DIAGNOSIS — J154 Pneumonia due to other streptococci: Secondary | ICD-10-CM | POA: Diagnosis present

## 2020-11-14 DIAGNOSIS — N1330 Unspecified hydronephrosis: Secondary | ICD-10-CM | POA: Diagnosis present

## 2020-11-14 DIAGNOSIS — Z8249 Family history of ischemic heart disease and other diseases of the circulatory system: Secondary | ICD-10-CM

## 2020-11-14 DIAGNOSIS — Z515 Encounter for palliative care: Secondary | ICD-10-CM | POA: Diagnosis not present

## 2020-11-14 DIAGNOSIS — R6520 Severe sepsis without septic shock: Secondary | ICD-10-CM | POA: Diagnosis present

## 2020-11-14 DIAGNOSIS — N1831 Chronic kidney disease, stage 3a: Secondary | ICD-10-CM | POA: Diagnosis present

## 2020-11-14 DIAGNOSIS — Z885 Allergy status to narcotic agent status: Secondary | ICD-10-CM

## 2020-11-14 DIAGNOSIS — M109 Gout, unspecified: Secondary | ICD-10-CM | POA: Diagnosis present

## 2020-11-14 DIAGNOSIS — Z7902 Long term (current) use of antithrombotics/antiplatelets: Secondary | ICD-10-CM

## 2020-11-14 DIAGNOSIS — J9601 Acute respiratory failure with hypoxia: Secondary | ICD-10-CM | POA: Diagnosis present

## 2020-11-14 DIAGNOSIS — M1990 Unspecified osteoarthritis, unspecified site: Secondary | ICD-10-CM | POA: Diagnosis present

## 2020-11-14 DIAGNOSIS — E785 Hyperlipidemia, unspecified: Secondary | ICD-10-CM | POA: Diagnosis present

## 2020-11-14 DIAGNOSIS — E119 Type 2 diabetes mellitus without complications: Secondary | ICD-10-CM | POA: Diagnosis not present

## 2020-11-14 DIAGNOSIS — I5032 Chronic diastolic (congestive) heart failure: Secondary | ICD-10-CM | POA: Diagnosis present

## 2020-11-14 DIAGNOSIS — R0603 Acute respiratory distress: Secondary | ICD-10-CM | POA: Diagnosis not present

## 2020-11-14 DIAGNOSIS — Z66 Do not resuscitate: Secondary | ICD-10-CM | POA: Diagnosis present

## 2020-11-14 DIAGNOSIS — Z79899 Other long term (current) drug therapy: Secondary | ICD-10-CM

## 2020-11-14 DIAGNOSIS — F3340 Major depressive disorder, recurrent, in remission, unspecified: Secondary | ICD-10-CM | POA: Diagnosis present

## 2020-11-14 DIAGNOSIS — Z794 Long term (current) use of insulin: Secondary | ICD-10-CM

## 2020-11-14 DIAGNOSIS — Z91013 Allergy to seafood: Secondary | ICD-10-CM

## 2020-11-14 DIAGNOSIS — A409 Streptococcal sepsis, unspecified: Secondary | ICD-10-CM | POA: Diagnosis present

## 2020-11-14 DIAGNOSIS — N189 Chronic kidney disease, unspecified: Secondary | ICD-10-CM | POA: Diagnosis not present

## 2020-11-14 DIAGNOSIS — R636 Underweight: Secondary | ICD-10-CM | POA: Diagnosis present

## 2020-11-14 DIAGNOSIS — R0902 Hypoxemia: Secondary | ICD-10-CM

## 2020-11-14 DIAGNOSIS — F17210 Nicotine dependence, cigarettes, uncomplicated: Secondary | ICD-10-CM | POA: Diagnosis present

## 2020-11-14 DIAGNOSIS — Z79890 Hormone replacement therapy (postmenopausal): Secondary | ICD-10-CM

## 2020-11-14 DIAGNOSIS — R54 Age-related physical debility: Secondary | ICD-10-CM | POA: Diagnosis present

## 2020-11-14 DIAGNOSIS — Z888 Allergy status to other drugs, medicaments and biological substances status: Secondary | ICD-10-CM

## 2020-11-14 LAB — COMPREHENSIVE METABOLIC PANEL
ALT: 73 U/L — ABNORMAL HIGH (ref 0–44)
AST: 135 U/L — ABNORMAL HIGH (ref 15–41)
Albumin: 2.4 g/dL — ABNORMAL LOW (ref 3.5–5.0)
Alkaline Phosphatase: 103 U/L (ref 38–126)
Anion gap: 9 (ref 5–15)
BUN: 35 mg/dL — ABNORMAL HIGH (ref 8–23)
CO2: 25 mmol/L (ref 22–32)
Calcium: 8.8 mg/dL — ABNORMAL LOW (ref 8.9–10.3)
Chloride: 107 mmol/L (ref 98–111)
Creatinine, Ser: 1.43 mg/dL — ABNORMAL HIGH (ref 0.44–1.00)
GFR, Estimated: 36 mL/min — ABNORMAL LOW (ref >60)
Glucose, Bld: 83 mg/dL (ref 70–99)
Potassium: 3.6 mmol/L (ref 3.5–5.1)
Sodium: 141 mmol/L (ref 135–145)
Total Bilirubin: 0.3 mg/dL (ref 0.3–1.2)
Total Protein: 5.3 g/dL — ABNORMAL LOW (ref 6.5–8.1)

## 2020-11-14 LAB — CBC WITH DIFFERENTIAL/PLATELET
Abs Immature Granulocytes: 0.02 10*3/uL (ref 0.00–0.07)
Basophils Absolute: 0 10*3/uL (ref 0.0–0.1)
Basophils Relative: 0 %
Eosinophils Absolute: 0 10*3/uL (ref 0.0–0.5)
Eosinophils Relative: 0 %
HCT: 38.6 % (ref 36.0–46.0)
Hemoglobin: 12.3 g/dL (ref 12.0–15.0)
Immature Granulocytes: 0 %
Lymphocytes Relative: 9 %
Lymphs Abs: 0.5 10*3/uL — ABNORMAL LOW (ref 0.7–4.0)
MCH: 31.7 pg (ref 26.0–34.0)
MCHC: 31.9 g/dL (ref 30.0–36.0)
MCV: 99.5 fL (ref 80.0–100.0)
Monocytes Absolute: 0.2 10*3/uL (ref 0.1–1.0)
Monocytes Relative: 3 %
Neutro Abs: 5.2 10*3/uL (ref 1.7–7.7)
Neutrophils Relative %: 88 %
Platelets: 377 10*3/uL (ref 150–400)
RBC: 3.88 MIL/uL (ref 3.87–5.11)
RDW: 15.5 % (ref 11.5–15.5)
Smear Review: UNDETERMINED
WBC Morphology: INCREASED
WBC: 5.9 10*3/uL (ref 4.0–10.5)
nRBC: 0 % (ref 0.0–0.2)

## 2020-11-14 LAB — URINALYSIS, ROUTINE W REFLEX MICROSCOPIC
Bilirubin Urine: NEGATIVE
Glucose, UA: NEGATIVE mg/dL
Hgb urine dipstick: NEGATIVE
Ketones, ur: NEGATIVE mg/dL
Nitrite: NEGATIVE
Protein, ur: NEGATIVE mg/dL
Specific Gravity, Urine: 1.013 (ref 1.005–1.030)
pH: 5 (ref 5.0–8.0)

## 2020-11-14 LAB — LACTIC ACID, PLASMA
Lactic Acid, Venous: 2.9 mmol/L (ref 0.5–1.9)
Lactic Acid, Venous: 3.8 mmol/L (ref 0.5–1.9)
Lactic Acid, Venous: 2.8 mmol/L (ref 0.5–1.9)
Lactic Acid, Venous: 3.4 mmol/L (ref 0.5–1.9)

## 2020-11-14 LAB — APTT: aPTT: 23 seconds — ABNORMAL LOW (ref 24–36)

## 2020-11-14 LAB — GLUCOSE, CAPILLARY
Glucose-Capillary: 66 mg/dL — ABNORMAL LOW (ref 70–99)
Glucose-Capillary: 80 mg/dL (ref 70–99)

## 2020-11-14 LAB — RESP PANEL BY RT-PCR (FLU A&B, COVID) ARPGX2
Influenza A by PCR: NEGATIVE
Influenza B by PCR: NEGATIVE
SARS Coronavirus 2 by RT PCR: NEGATIVE

## 2020-11-14 LAB — TSH: TSH: 159.296 u[IU]/mL — ABNORMAL HIGH (ref 0.350–4.500)

## 2020-11-14 LAB — PROTIME-INR
INR: 1.3 — ABNORMAL HIGH (ref 0.8–1.2)
Prothrombin Time: 16.3 seconds — ABNORMAL HIGH (ref 11.4–15.2)

## 2020-11-14 LAB — PROCALCITONIN: Procalcitonin: 6.2 ng/mL

## 2020-11-14 MED ORDER — ALIGN 4 MG PO CAPS: 4.0000 mg | ORAL_CAPSULE | Freq: Every day | ORAL | Status: DC

## 2020-11-14 MED ORDER — LACTATED RINGERS IV BOLUS (SEPSIS)
1000.0000 mL | Freq: Once | INTRAVENOUS | Status: AC
  Administered 2020-11-14: 1000 mL via INTRAVENOUS

## 2020-11-14 MED ORDER — LEVOTHYROXINE SODIUM 88 MCG PO TABS: 88.0000 ug | ORAL_TABLET | ORAL | Status: DC

## 2020-11-14 MED ORDER — LEVOTHYROXINE SODIUM 100 MCG PO TABS
100.0000 ug | ORAL_TABLET | ORAL | Status: DC
  Administered 2020-11-15: 100 ug via ORAL
  Filled 2020-11-14: qty 1

## 2020-11-14 MED ORDER — GUAIFENESIN ER 600 MG PO TB12
600.0000 mg | ORAL_TABLET | Freq: Two times a day (BID) | ORAL | Status: DC
  Administered 2020-11-14 – 2020-11-15 (×2): 600 mg via ORAL
  Filled 2020-11-14 (×2): qty 1

## 2020-11-14 MED ORDER — SODIUM CHLORIDE 0.9 % IV BOLUS
500.0000 mL | Freq: Once | INTRAVENOUS | Status: AC
  Administered 2020-11-14: 500 mL via INTRAVENOUS

## 2020-11-14 MED ORDER — POLYETHYLENE GLYCOL 3350 17 G PO PACK
8.5000 g | PACK | Freq: Every evening | ORAL | Status: DC
  Administered 2020-11-14 – 2020-11-15 (×2): 17 g via ORAL
  Filled 2020-11-14 (×2): qty 1

## 2020-11-14 MED ORDER — ACETAMINOPHEN 325 MG PO TABS: 650.0000 mg | ORAL_TABLET | Freq: Four times a day (QID) | ORAL | Status: DC | PRN

## 2020-11-14 MED ORDER — LACTATED RINGERS IV SOLN: INTRAVENOUS | Status: DC

## 2020-11-14 MED ORDER — PANTOPRAZOLE SODIUM 40 MG PO TBEC
40.0000 mg | DELAYED_RELEASE_TABLET | Freq: Every day | ORAL | Status: DC
  Administered 2020-11-14: 40 mg via ORAL
  Filled 2020-11-14: qty 1

## 2020-11-14 MED ORDER — SODIUM CHLORIDE 0.9 % IV SOLN
2.0000 g | INTRAVENOUS | Status: AC
  Administered 2020-11-14 – 2020-11-16 (×3): 2 g via INTRAVENOUS
  Filled 2020-11-14 (×3): qty 20

## 2020-11-14 MED ORDER — ACETAMINOPHEN 650 MG RE SUPP: 650.0000 mg | Freq: Four times a day (QID) | RECTAL | Status: DC | PRN

## 2020-11-14 MED ORDER — SODIUM CHLORIDE 0.9% FLUSH
3.0000 mL | Freq: Two times a day (BID) | INTRAVENOUS | Status: DC
  Administered 2020-11-14 – 2020-11-16 (×4): 3 mL via INTRAVENOUS

## 2020-11-14 MED ORDER — INSULIN ASPART 100 UNIT/ML IJ SOLN: 0.0000 [IU] | Freq: Three times a day (TID) | INTRAMUSCULAR | Status: DC

## 2020-11-14 MED ORDER — SORBITOL 70 % SOLN
400.0000 mL | TOPICAL_OIL | Freq: Once | ORAL | Status: AC
  Administered 2020-11-14: 400 mL via RECTAL
  Filled 2020-11-14: qty 120

## 2020-11-14 MED ORDER — DOXYCYCLINE HYCLATE 100 MG PO TABS
100.0000 mg | ORAL_TABLET | Freq: Two times a day (BID) | ORAL | Status: DC
  Administered 2020-11-14 (×2): 100 mg via ORAL
  Filled 2020-11-14 (×2): qty 1

## 2020-11-14 MED ORDER — LIDOCAINE 5 % EX OINT
1.0000 "application " | TOPICAL_OINTMENT | Freq: Every day | CUTANEOUS | Status: DC | PRN
  Filled 2020-11-14: qty 35.44

## 2020-11-14 MED ORDER — ALBUMIN HUMAN 25 % IV SOLN
25.0000 g | Freq: Once | INTRAVENOUS | Status: AC
  Administered 2020-11-14: 25 g via INTRAVENOUS
  Filled 2020-11-14: qty 100

## 2020-11-14 MED ORDER — LACTATED RINGERS IV BOLUS
1000.0000 mL | Freq: Once | INTRAVENOUS | Status: AC
  Administered 2020-11-14: 1000 mL via INTRAVENOUS

## 2020-11-14 MED ORDER — OXYCODONE HCL 5 MG PO TABS
5.0000 mg | ORAL_TABLET | Freq: Four times a day (QID) | ORAL | Status: DC | PRN
Start: 2020-11-14 — End: 2020-11-16
  Administered 2020-11-15: 5 mg via ORAL
  Filled 2020-11-14: qty 1

## 2020-11-14 MED ORDER — MORPHINE SULFATE ER 15 MG PO TBCR
15.0000 mg | EXTENDED_RELEASE_TABLET | Freq: Two times a day (BID) | ORAL | Status: DC
  Administered 2020-11-14: 15 mg via ORAL
  Filled 2020-11-14: qty 1

## 2020-11-14 MED ORDER — ESCITALOPRAM OXALATE 20 MG PO TABS
20.0000 mg | ORAL_TABLET | Freq: Every day | ORAL | Status: DC
  Administered 2020-11-14: 20 mg via ORAL
  Filled 2020-11-14: qty 1

## 2020-11-14 MED ORDER — SENNOSIDES-DOCUSATE SODIUM 8.6-50 MG PO TABS
1.0000 | ORAL_TABLET | Freq: Two times a day (BID) | ORAL | Status: DC
  Administered 2020-11-14 – 2020-11-15 (×2): 1 via ORAL
  Filled 2020-11-14 (×2): qty 1

## 2020-11-14 MED ORDER — ONDANSETRON HCL 4 MG PO TABS: 4.0000 mg | ORAL_TABLET | Freq: Four times a day (QID) | ORAL | Status: DC | PRN

## 2020-11-14 MED ORDER — MESALAMINE 1.2 G PO TBEC
1.2000 g | DELAYED_RELEASE_TABLET | Freq: Two times a day (BID) | ORAL | Status: DC
  Administered 2020-11-14 – 2020-11-15 (×2): 1.2 g via ORAL
  Filled 2020-11-14 (×5): qty 1

## 2020-11-14 MED ORDER — ONDANSETRON HCL 4 MG/2ML IJ SOLN: 4.0000 mg | Freq: Four times a day (QID) | INTRAMUSCULAR | Status: DC | PRN

## 2020-11-14 MED ORDER — CILIDINIUM-CHLORDIAZEPOXIDE 2.5-5 MG PO CAPS
1.0000 | ORAL_CAPSULE | Freq: Two times a day (BID) | ORAL | Status: DC
  Administered 2020-11-14: 1 via ORAL
  Filled 2020-11-14 (×3): qty 1

## 2020-11-14 MED ORDER — ENOXAPARIN SODIUM 30 MG/0.3ML IJ SOSY
30.0000 mg | PREFILLED_SYRINGE | INTRAMUSCULAR | Status: DC
  Administered 2020-11-14 – 2020-11-15 (×2): 30 mg via SUBCUTANEOUS
  Filled 2020-11-14 (×2): qty 0.3

## 2020-11-14 MED ORDER — ASPIRIN EC 81 MG PO TBEC
81.0000 mg | DELAYED_RELEASE_TABLET | Freq: Every day | ORAL | Status: DC
  Administered 2020-11-14: 81 mg via ORAL
  Filled 2020-11-14: qty 1

## 2020-11-14 MED ORDER — SODIUM CHLORIDE 0.9 % IV BOLUS
1000.0000 mL | Freq: Once | INTRAVENOUS | Status: AC
  Administered 2020-11-14: 1000 mL via INTRAVENOUS

## 2020-11-14 MED ORDER — AMITRIPTYLINE HCL 50 MG PO TABS
25.0000 mg | ORAL_TABLET | Freq: Every day | ORAL | Status: DC
  Administered 2020-11-14 – 2020-11-15 (×2): 25 mg via ORAL
  Filled 2020-11-14 (×2): qty 1

## 2020-11-14 MED ORDER — OXYCODONE HCL 5 MG PO TABS: 10.0000 mg | ORAL_TABLET | Freq: Two times a day (BID) | ORAL | Status: DC | PRN

## 2020-11-14 MED ORDER — SIMVASTATIN 20 MG PO TABS
10.0000 mg | ORAL_TABLET | Freq: Every evening | ORAL | Status: DC
  Administered 2020-11-14: 10 mg via ORAL
  Filled 2020-11-14: qty 1

## 2020-11-14 MED ORDER — ALBUTEROL SULFATE (2.5 MG/3ML) 0.083% IN NEBU
2.5000 mg | INHALATION_SOLUTION | Freq: Four times a day (QID) | RESPIRATORY_TRACT | Status: DC | PRN
Start: 2020-11-14 — End: 2020-11-17
  Administered 2020-11-15: 2.5 mg via RESPIRATORY_TRACT
  Filled 2020-11-14: qty 3

## 2020-11-14 NOTE — H&P (Addendum)
History and Physical    NIOMI VALENT West:[id number] DOB: [birth year] DOA: 10/29/2020  Referring MD/NP/PA: Christiana Fuchs, DO PCP: [name], MD  Patient coming from: Home via EMS  Chief Complaint: Couldn't walk  I have personally briefly reviewed patient's old medical records in [facility]   HPI: Tammie West is a 87 y.o. female with medical history significant of hypertension, hyperlipidemia, CAD, hypothyroidism, diabetes mellitus type 2, peripheral vascular disease, chronic pain, CKD stage III presents after being unable to stand or walk this morning.  History is obtained from the patient with the assistance of her daughter who is present at bedside.  She had otherwise been in her normal state of health for which the daughter states that she is on chronic pain medication.  At baseline family has a hard time getting the patient eat.  Apparently the patient had complained of some left flank pain yesterday, but family thought that it was maybe is secondary to the way she was positioned.    Over last couple days patient reports that she has had couple episodes of nausea and vomiting and has not had much. This morning her husband called his daughter and stated that the patient was shivering.  They checked her blood sugar and noted that it was in the 80s.  Her temperature was measured up to 102.4 F after checking on 2 separate thermometers. The patient complained of having to use the bathroom as well as lower abdominal pain.  She reportedly had not had a bowel movement in over a week. however, when family tried to stand her up patient collapsed and was moaning.  She never lost consciousness or had any significant trauma to her head.  Family was able to get her back in bed and thereafter called EMS.  Patient denies having any significant cough  ED Course: Upon admission into the emergency department patient was seen to be febrile up to 100.3 F, respiration 15-23, blood  pressure as low as 76/52 and O2 saturations noted to be as low as 70% with improvement on 3 L nasal cannula oxygen.  Labs significant for WBC 5.9 with increased bands, BUN 35, creatinine 1.4(baseline creatinine 1), and lactic acid 2.9.  CT scan of the abdomen and pelvis significant for confluent left lung base pneumonia, large volume of retained stool within the redundant large bowel with a centimeter diameter stool ball in the rectum distended urinary bladder with mild right hydronephrosis/hydroureter, distended gallbladder without signs of cholecystitis.  Urinalysis noted trace leukocytes and rare bacteria with 6-10 WBCs.  Influenza and COVID-19 screening was negative.  Blood cultures were obtained.  Patient had been given 1.5 L of lactated Ringer Rocephin IV, and doxycycline.  Review of Systems  Constitutional:  Positive for fever and malaise/fatigue.  HENT:  Negative for ear discharge.   Eyes:  Negative for photophobia and pain.  Respiratory:  Negative for cough.   Cardiovascular:  Negative for chest pain and leg swelling.  Gastrointestinal:  Positive for abdominal pain, constipation, nausea and vomiting.  Genitourinary:  Positive for flank pain.  Musculoskeletal:  Positive for falls.  Neurological:  Positive for weakness. Negative for loss of consciousness.  Psychiatric/Behavioral:  Negative for memory loss and substance abuse.    Past Medical History:  Diagnosis Date   Bilateral carpal tunnel syndrome 02/17/2017   Carotid artery disease (HCC)    1-39% bilateral stenosis   Carpal tunnel syndrome    CKD (chronic kidney disease) stage 4, GFR 15-29 ml/min (HCC)  Coronary artery disease 11/2014    cath with Prox RCA to Mid RCA lesion, 45% stenosed and Ost LAD to Prox LAD lesion, 30% stenosed byt recent cath and high calcium score by coronary CTA.   Crohn's disease ([place])    Depression    Diabetes mellitus without complication (HCC)    GERD (gastroesophageal reflux disease)    Goiter     Hyperlipidemia    Hypertension    Hypothyroidism    Insomnia    Migraine    Mitral regurgitation    mild to moderate by echo 04/2017   Osteoarthritis    Osteoporosis    PVD (peripheral vascular disease) ([place])    -s/p stenting of the 99% left mid SFA - followed by Dr. [name]   Trochanteric bursitis     Past Surgical History:  Procedure Laterality Date   ABDOMINAL HYSTERECTOMY     APPENDECTOMY     CARDIAC CATHETERIZATION N/A 12/13/2014   Procedure: Left Heart Cath and Coronary Angiography;  Surgeon: Belva Crome, MD;  Location: [facility];  Service: Cardiovascular;  Laterality: N/A;   CAROTID-SUBCLAVIAN BYPASS GRAFT Left 01/12/2020   Procedure: LEFT COMMON CAROTID-SUBCLAVIAN ARTERY BYPASS WITH GRAFT;  Surgeon: Waynetta Sandy, MD;  Location: [facility];  Service: Vascular;  Laterality: Left;   CATARACT EXTRACTION W/ INTRAOCULAR LENS  IMPLANT, BILATERAL Bilateral    LOWER EXTREMITY ANGIOGRAPHY Left 04/04/2019   Procedure: LOWER EXTREMITY ANGIOGRAPHY;  Surgeon: Lorretta Harp, MD;  Location: [facility];  Service: Cardiovascular;  Laterality: Left;   PERIPHERAL VASCULAR INTERVENTION Left 04/04/2019   Procedure: PERIPHERAL VASCULAR INTERVENTION;  Surgeon: Lorretta Harp, MD;  Location: [facility];  Service: Cardiovascular;  Laterality: Left;   UPPER EXTREMITY ANGIOGRAPHY N/A 12/19/2019   Procedure: UPPER EXTREMITY ANGIOGRAPHY;  Surgeon: Lorretta Harp, MD;  Location: [facility];  Service: Cardiovascular;  Laterality: N/A;     reports that she has been smoking cigarettes. She has been smoking an average of .25 packs per day. She has never used smokeless tobacco. She reports that she does not drink alcohol and does not use drugs.  Allergies  Allergen Reactions   Atorvastatin Nausea And Vomiting    Other reaction(s): vomiting Other reaction(s): vomiting   Cefaclor Other (See Comments)    Pt does not remember the reaction Other reaction(s):  Unknown Other reaction(s): Unknown   Codeine Nausea And Vomiting        Dexlansoprazole Diarrhea    Other reaction(s): Diarrhea Other reaction(s): Diarrhea   Erythromycin Hives and Swelling    Other reaction(s): Unknown Other reaction(s): Unknown   Exenatide Nausea And Vomiting    Other reaction(s): nausea   Fosamax [Alendronate Sodium] Other (See Comments)    GI intolerance   Fosamax [Alendronate]     Other reaction(s): GI intolerance   Ibuprofen     Other reaction(s): elevated creatinine Other reaction(s): elevated creatinine   Macrobid [Nitrofurantoin Monohyd Macro] Other (See Comments)    Pt does not remember the reaction   Metformin And Related Nausea And Vomiting   Metformin Hcl     Other reaction(s): nausea Other reaction(s): nausea   Nitrofurantoin     Other reaction(s): Unknown   Nsaids     Elevated creatinine    Other     Other reaction(s): GI intolerance Other reaction(s): nausea   Penicillin G     Other reaction(s): Unknown Other reaction(s): Unknown   Penicillins Hives and Swelling    Has patient  had a PCN reaction causing immediate rash, facial/tongue/throat swelling, SOB or lightheadedness with hypotension: Yes Has patient had a PCN reaction causing severe rash involving mucus membranes or skin necrosis: No Has patient had a PCN reaction that required hospitalization No Has patient had a PCN reaction occurring within the last 10 years: No If all of the above answers are "NO", then may proceed with Cephalosporin use.   Rosuvastatin Nausea And Vomiting    Other reaction(s): nausea Other reaction(s): nausea   Shellfish-Derived Products     Other reaction(s): Unknown Other reaction(s): Unknown   Tolmetin Other (See Comments)    Elevated creatinine    Shellfish Allergy Nausea And Vomiting, Rash and Other (See Comments)    Felt spaced out    Family History  Problem Relation Age of Onset   Emphysema Father    CAD Brother    Heart disease Brother     Heart attack Neg Hx    Stroke Neg Hx     Prior to Admission medications   Medication Sig Start Date End Date Taking? Authorizing Provider  Accu-Chek FastClix Lancets MISC for use when checking blood sugars Dx E.11.9 06/02/12   [provider]  Accu-Chek FastClix Lancets MISC for use when checking blood sugars Dx E.11.9 06/02/12   [provider]  allopurinol (ZYLOPRIM) 100 MG tablet Take 1 tablet by mouth once daily for 30 days    [provider]  amitriptyline (ELAVIL) 25 MG tablet 1 tablet at bedtime.    [provider]  amLODipine (NORVASC) 5 MG tablet Take 1 tablet by mouth once daily for 90 days    [provider]  aspirin EC 81 MG tablet 1 tablet    [provider]  Blood Glucose Monitoring Suppl (ACCU-CHEK NANO SMARTVIEW) w/Device KIT See admin instructions. 06/02/12   [provider]  Blood Glucose Monitoring Suppl ([place]) w/Device KIT See admin instructions. 06/02/12   [provider]  Cholecalciferol (VITAMIN D) 50 MCG (2000 UT) tablet 1 tablet    [provider]  clidinium-chlordiazePOXIDE (LIBRAX) 5-2.5 MG capsule Take 1 capsule by mouth 2 (two) times daily.    [provider]  clopidogrel (PLAVIX) 75 MG tablet 1 tablet    [provider]  colchicine 0.6 MG tablet 1 tablet 01/25/19   [provider]  escitalopram (LEXAPRO) 20 MG tablet take 1 tablet by mouth once daily for 90 days    [provider]  furosemide (LASIX) 20 MG tablet 1 tablet    [provider]  glucose blood test strip for use when checking blood sugars/ Dx E11.9 06/02/12   [provider]  insulin glargine (LANTUS SOLOSTAR) 100 UNIT/ML Solostar Pen 3 units 06/05/20   [provider]  Insulin Pen Needle (BD PEN NEEDLE NANO U/F) 32G X 4 MM MISC USE 1 TO 2 PEN NEEDLES DAILY WITH INSULIN INJECTIONS    [provider]  levothyroxine (EUTHYROX) 88 MCG tablet 1  tablet in the morning on an empty stomach    [provider]  levothyroxine (SYNTHROID) 100 MCG tablet  03/13/20   [provider]  levothyroxine (SYNTHROID) 88 MCG tablet  03/09/20   [provider]  lidocaine (XYLOCAINE) 5 % ointment 1 application as needed    [provider]  lisinopril (ZESTRIL) 5 MG tablet 1 tablet    [provider]  mesalamine (LIALDA) 1.2 g EC tablet 2 tablets with a meal    [provider]  metoprolol succinate (TOPROL-XL) 50 MG 24 hr tablet Take 50 mg by mouth at bedtime. 07/14/19   [provider]  morphine (MS CONTIN) 15 MG 12 hr tablet Take 15 mg by mouth 2 (two) times daily as needed. 02/06/20   [provider]  Morphine Sulfate ER (MORPHABOND ER) 15 MG T12A 1 tablet    [provider]  mupirocin ointment (BACTROBAN) 2 % Apply to affected toe once daily. 04/24/20   Marzetta Board, DPM  naloxone (NARCAN) nasal spray 4 mg/0.1 mL 1 GRAM AS NEEDED AS NEEDED 03/06/20   [provider]  nitroGLYCERIN (NITROSTAT) 0.4 MG SL tablet Place 1 tablet (0.4 mg total) under the tongue every 5 (five) minutes as needed for chest pain. 11/15/19   Lorretta Harp, MD  omeprazole (PRILOSEC) 20 MG capsule 1 capsule 30 minutes before morning meal    [provider]  Oxycodone HCl 10 MG TABS Take 10 mg by mouth every 4 (four) hours as needed (breakthrough pain).  07/11/17   [provider]  Oxycodone HCl 10 MG TABS 1 tablet    [provider]  polyethylene glycol (MIRALAX / GLYCOLAX) packet Take 17 g by mouth daily as needed for mild constipation.    [provider]  Polyethylene Glycol 3350 (MIRALAX PO)     [provider]  Probiotic Product (ALIGN PO) See admin instructions.    [provider]  Probiotic Product (ALIGN) 4 MG CAPS Take 1 capsule by mouth daily after breakfast.     [provider]  simvastatin (ZOCOR) 10 MG tablet 1 tablet  in the evening    [provider]    Physical Exam:  Constitutional: Underweight elderly female appears to be ill Vitals:   11/24/2020 1200 11/09/2020 1215 11/13/2020 1230 10/30/2020 1245  BP: (!) 80/54 (!) 96/41 (!) 99/40 (!) 111/44  Pulse: 84 82 70 84  Resp: (!) _0 (!) 21  Temp:      TempSrc:      SpO2: 95% 95% 98% 98%  Weight:      Height:       Eyes: PERRL, lids and conjunctivae normal ENMT: Mucous membranes are dry. Posterior pharynx clear of any exudate or lesions.Normal dentition.  Neck: normal, supple, no masses, no thyromegaly Respiratory: Mildly tachypneic with rales appreciated in the left lung field.  O2 saturations currently maintained on 3 L nasal cannula oxygen. Cardiovascular: Regular rate and rhythm with positive systolic murmur.  1+ distal pulses appreciated in both lower extremity.  Trace lower extremity edema Abdomen: Abdominal distention with generalized tenderness to palpation.  Bowel sounds were present, but decreased. Musculoskeletal: no clubbing / cyanosis. No joint deformity upper and lower extremities. Good ROM, no contractures. Normal muscle tone.  Skin: no rashes, lesions, ulcers. No induration Neurologic: CN 2-12 grossly intact. Sensation intact, DTR normal. Strength 5/5 in all 4.  Psychiatric: Normal judgment and insight. Alert and oriented x 3. Normal mood.     Labs on Admission: I have personally reviewed following labs and imaging studies  CBC: Recent Labs  Lab 10/27/2020 0942  WBC 5.9  NEUTROABS 5.2  HGB 12.3  HCT 38.6  MCV 99.5  PLT 329   Basic Metabolic Panel: Recent Labs  Lab 11/21/2020 0942  NA 141  K 3.6  CL 107  CO2 25  GLUCOSE 83  BUN 35*  CREATININE 1.43*  CALCIUM 8.8*   GFR: Estimated Creatinine Clearance: 20.6 mL/min (A) (by C-G formula based on SCr  of 1.43 mg/dL (H)). Liver Function Tests: Recent Labs  Lab 11/19/2020 0942  AST 135*  ALT 73*  ALKPHOS 103  BILITOT 0.3  PROT 5.3*  ALBUMIN 2.4*   No  results for input(s): LIPASE, AMYLASE in the last 168 hours. No results for input(s): AMMONIA in the last 168 hours. Coagulation Profile: Recent Labs  Lab 10/29/2020 0942  INR 1.3*   Cardiac Enzymes: No results for input(s): CKTOTAL, CKMB, CKMBINDEX, TROPONINI in the last 168 hours. BNP (last 3 results) No results for input(s): PROBNP in the last 8760 hours. HbA1C: No results for input(s): HGBA1C in the last 72 hours. CBG: No results for input(s): GLUCAP in the last 168 hours. Lipid Profile: No results for input(s): CHOL, HDL, LDLCALC, TRIG, CHOLHDL, LDLDIRECT in the last 72 hours. Thyroid Function Tests: No results for input(s): TSH, T4TOTAL, FREET4, T3FREE, THYROIDAB in the last 72 hours. Anemia Panel: No results for input(s): VITAMINB12, FOLATE, FERRITIN, TIBC, IRON, RETICCTPCT in the last 72 hours. Urine analysis:    Component Value Date/Time   COLORURINE YELLOW 11/25/2020 0942   APPEARANCEUR CLOUDY (A) 11/15/2020 0942   LABSPEC 1.013 10/30/2020 0942   PHURINE 5.0 11/18/2020 0942   GLUCOSEU NEGATIVE 11/02/2020 0942   HGBUR NEGATIVE 11/07/2020 0942   BILIRUBINUR NEGATIVE 10/29/2020 0942   KETONESUR NEGATIVE 11/25/2020 0942   PROTEINUR NEGATIVE 10/28/2020 0942   UROBILINOGEN 0.2 01/10/2012 1830   NITRITE NEGATIVE 11/21/2020 0942   LEUKOCYTESUR TRACE (A) 11/26/2020 0942   Sepsis Labs: Recent Results (from the past 240 hour(s))  Resp Panel by RT-PCR (Flu A&B, Covid) Nasopharyngeal Swab     Status: None   Collection Time: 11/18/2020 10:12 AM   Specimen: Nasopharyngeal Swab; Nasopharyngeal(NP) swabs in vial transport medium  Result Value Ref Range Status   SARS Coronavirus 2 by RT PCR NEGATIVE NEGATIVE Final    Comment: (NOTE) SARS-CoV-2 target nucleic acids are NOT DETECTED.  The SARS-CoV-2 RNA is generally detectable in upper respiratory specimens during the acute phase of infection. The lowest concentration of SARS-CoV-2 viral copies this assay can detect is 138  copies/mL. A negative result does not preclude SARS-Cov-2 infection and should not be used as the sole basis for treatment or other patient management decisions. A negative result may occur with  improper specimen collection/handling, submission of specimen other than nasopharyngeal swab, presence of viral mutation(s) within the areas targeted by this assay, and inadequate number of viral copies(<138 copies/mL). A negative result must be combined with clinical observations, patient history, and epidemiological information. The expected result is Negative.  Fact Sheet for Patients:  EntrepreneurPulse.com.au  Fact Sheet for Healthcare Providers:  IncredibleEmployment.be  This test is no t yet approved or cleared by the [place] FDA and  has been authorized for detection and/or diagnosis of SARS-CoV-2 by FDA under an Emergency Use Authorization (EUA). This EUA will remain  in effect (meaning this test can be used) for the duration of the COVID-19 declaration under Section 564(b)(1) of the Act, 21 U.S.C.section 360bbb-3(b)(1), unless the authorization is terminated  or revoked sooner.       Influenza A by PCR NEGATIVE NEGATIVE Final   Influenza B by PCR NEGATIVE NEGATIVE Final    Comment: (NOTE) The Xpert Xpress SARS-CoV-2/FLU/RSV plus assay is intended as an aid in the diagnosis of influenza from Nasopharyngeal swab specimens and should not be used as a sole basis for treatment. Nasal washings and aspirates are unacceptable for Xpert Xpress SARS-CoV-2/FLU/RSV testing.  Fact Sheet for Patients: EntrepreneurPulse.com.au  Fact  Sheet for Healthcare Providers: IncredibleEmployment.be  This test is not yet approved or cleared by the [place] and has been authorized for detection and/or diagnosis of SARS-CoV-2 by FDA under an Emergency Use Authorization (EUA). This EUA will remain in effect (meaning  this test can be used) for the duration of the COVID-19 declaration under Section 564(b)(1) of the Act, 21 U.S.C. section 360bbb-3(b)(1), unless the authorization is terminated or revoked.  Performed at [facility], [place] [address], [place], [place] [address]      Radiological Exams on Admission: CT ABDOMEN PELVIS WO CONTRAST  Result Date: 11/19/2020 CLINICAL DATA:  87 year old female with abdominal pain and fever. EXAM: CT ABDOMEN AND PELVIS WITHOUT CONTRAST TECHNIQUE: Multidetector CT imaging of the abdomen and pelvis was performed following the standard protocol without IV contrast. COMPARISON:  CT Abdomen and Pelvis 01/10/2012. CTA chest 12/02/2019. FINDINGS: Lower chest: Pleural effusion and round atelectasis suspected in the left lower lobe last year. But now there is consolidation throughout the visible left lower lobe and lingula, with new air bronchograms. Persistent small volume of pleural fluid in the costophrenic angle with associated pleural thickening (series 2, image 13). Increased volume loss in the left chest, mediastinum deviated to the left. No cardiomegaly or pericardial effusion. Negative right lung base. Hepatobiliary: Distended gallbladder, 11 cm in length. But no pericholecystic inflammation. Negative noncontrast liver. Pancreas: Chronically atrophied pancreas. Spleen: Diminutive.  Negative. Adrenals/Urinary Tract: Left nephrolithiasis, lower pole calculus up to 7 mm. The urinary bladder is distended despite mass effect from the colon, with an estimated bladder volume of 370 mL. No bladder wall thickening. There is subtle right hydronephrosis and right hydroureter, which might also be related to mass effect on the distal ureter. Chronic pelvic phleboliths. No obstructing urinary calculus identified. Stomach/Bowel: Large volume of low-density stool retained throughout redundant colon, including a roughly 8 cm diameter stool ball in the rectum. The hepatic flexure is  redundant but decompressed. No large bowel inflammation identified. Terminal ileum and small bowel loops are nondilated, although the distal stomach and proximal duodenum are distended with fluid. Oval medication or supplements in the gastric fundus. No free air, free fluid or convincing bowel inflammation. Vascular/Lymphatic: Extensive Aortoiliac calcified atherosclerosis. Vascular patency is not evaluated in the absence of IV contrast. No lymphadenopathy identified. Reproductive: Absent uterus.  Diminutive or absent ovaries. Other: No pelvic free fluid. Musculoskeletal: Chronic scoliosis and degeneration in the lower spine. No acute or suspicious osseous lesion identified. IMPRESSION: 1. 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 2. Large volume of retained stool throughout redundant large bowel, including a 8 cm diameter stool ball in the rectum. Consider Fecal Impaction. 3. Superimposed distended urinary bladder (370 mL) which might be the etiology of mild right hydronephrosis and hydroureter, versus mass effect on the distal ureter secondary to #2. Left nephrolithiasis but no obstructing calculus identified. 4. Dilated gallbladder, but no CT evidence of acute cholecystitis. 5. Aortic Atherosclerosis (ICD10-I70.0). Electronically Signed   By: Genevie Ann M.D.   On: 11/24/2020 11:57   DG Chest Port 1 View  Result Date: 10/30/2020 CLINICAL DATA:  Possible sepsis EXAM: PORTABLE CHEST 1 VIEW COMPARISON:  2017 FINDINGS: Left perihilar and lung base opacities. Right lung is clear. Possible small left pleural effusion. No pneumothorax. Similar cardiomediastinal contours. IMPRESSION: Left perihilar lung base opacities suspicious for pneumonia. Possible small left pleural effusion. Electronically Signed   By: Macy Mis M.D.   On: 11/11/2020 10:30  EKG: Independently reviewed.  Sinus tachycardia 102 bpm with premature atrial  complexes.  Assessment/Plan Acute respiratory failure with hypoxia due to severe sepsis secondary to pneumonia: Patient reportedly had fever up to 102.4 F at home prior to arrival.  On admission was found to be tachycardic and tachypneic with blood pressures as low as 76/52 with some improvement after IV fluids.  Lactic went from 2.9->3.8. O2 saturations were noted to be 70 % on room air for which patient was placed on 3 L nasal cannula oxygen oxygen with improvement in O2 saturations to greater than 92%.  Imaging studies significant for a left-sided pneumonia. -Admit to a progressive bed -Continuous pulse oximetry with nasal cannula oxygen maintain O2 saturation greater than 92% -Follow-up blood culture -Check procalcitonin -Incentive spirometry and flutter valve -Continue empiric antibiotics Rocephin and doxycycline -Mucinex -Trend lactic acid level  Near syncope transient hypotension: Acute.  This morning when family tried to get patient up to use the restroom she had a near syncopal episode, but was never reported to have fully lost consciousness.  Blood pressures initially as low as 76/52 with some improvement after IV fluid boluses.  Home blood pressure medications include amlodipine 5 mg daily, furosemide 20 mg every other day, and lisinopril 5 mg daily.  Patient reportedly was not on metoprolol 50 mg nightly anymore. -Hold home blood pressure medications -Bolus additional 1 L normal saline IV fluids and continued on rate of 125 mL/h -Adjust IV fluids to maintain maps greater than 65  Acute kidney injury superimposed on chronic kidney disease stage IIIa  hydronephrosis: Patient presents with creatinine of 1.45 with BUN 35.  Baseline creatinine previously noted to be around 1.  This is greater than 0.3 increased from baseline to suggest acute kidney injury.  The elevated BUN to creatinine ratio is greater than 20 to suggest prerenal cause of symptoms, but imaging studies did not note signs  of bladder distention with hydronephrosis concerning for obstruction possibly related with patient's constipation without stone appreciated. -Bladder scan every 8 hours x4 -In and out cath as needed in place Foley catheter if more than 3 attempts  -Continue IV fluids -Recheck kidney function tomorrow morning   Fecal impaction: Acute.  Patient reports no bowel movement in over a week.  Was noted to have a 8 cm stool ball in the rectum present on CT imaging of the abdomen pelvis.  ED provider had tried manual disimpaction, but noted patient had hemorrhoids and was bleeding.   -Smog enema -Senokot-S twice daily  Diastolic congestive heart: Chronic last echocardiogram revealed EF of 65-70% with grade 1 diastolic function in 03/9028. -Strict intake and output and daily weight  Chronic pain: Home medication regimen includes morphine sulfate 15 mg twice daily, and oxycodone 10 mg twice daily as needed for pain. -Continue morphine sulfate, but decreased oxycodone to every 6 hours due to patient lethargy  Crohn's disease -Continue mesalamine, probiotics  Diabetes mellitus type 2: Home insulin regimen includes glargine 1 - 3 units daily with breakfast and Humalog as needed for blood sugars greater than 200. -Hypoglycemic protocol -CBGs before every meal with sensitive SSI -Adjust insulin regimen as needed  Depression -Continue Elavil and Lexapro  Hypothyroidism -Check TSH -Continue levothyroxine  Transaminitis: Acute.  On admission AST 135, ALT 73, and total bilirubin within normal limits at 0.3. -Recheck CMP tomorrow morning  Underweight  hypoalbuminemia: Acute.  Patient is underweight with BMI 17.51 kg/m.  She reports poor p.o. intake due to lack of appetite.  Albumin 2.4 on admission -Check prealbumin in   DVT prophylaxis: lovenox Code Status: Full Family Communication: Daughter updated at bedside Disposition Plan: To be determined Consults called: None Admission status:  Inpatient require more than 2 midnight stay  Norval Morton MD [facility]   If 7PM-7AM, please contact night-coverage   11/19/2020, 1:12 PM

## 2020-11-14 NOTE — Sepsis Progress Note (Signed)
Notified bedside nurse of need to draw repeat lactic acid. 

## 2020-11-14 NOTE — ED Triage Notes (Signed)
Pt BIB [facility] from home d/t a near syncopal episode, pt's daughter stated she was "out of it" for approx. 15 min. EMS reports febrile 102.3, but her temp upon arrival is 98.8 & pt denies having fever. She has been constipated for the last week (per EMS) & does endorse abd pain, denies n/v or dizziness. BP 100/60.

## 2020-11-14 NOTE — ED Notes (Signed)
Patient transported to CT 

## 2020-11-14 NOTE — Sepsis Progress Note (Signed)
ELink tracking the code sepsis.  

## 2020-11-14 NOTE — Sepsis Progress Note (Signed)
Notified bedside nurse of need to draw a third Lactic Acid since the lactates are trending up.

## 2020-11-14 NOTE — Progress Notes (Signed)
Pt was hypotensive after receiving 500 bolus NS and complaining of SOB. Informed Dr. Myna Hidalgo MD via Shea Evans. Placed pt on NRB and new orders were provided. Pt is given 1L Bolus LR and albumin. Will continue to monitor pt per MD direction. Pt is resting comfortably now.

## 2020-11-14 NOTE — Progress Notes (Signed)
Attempted to disimpact patient per orders but stool was soft and unable to remove very much.  Patient was able to bear down and remove small amount of stool.  Gave SMOG enema per previous orders and patient had large soft BM.   Patient was cleaned and tolerated procedure well.  Daughter informed of results.

## 2020-11-14 NOTE — ED Notes (Signed)
I did a soap suds enema on pt and it was not successful. No stool came out and pt was not able to retain the enema.

## 2020-11-14 NOTE — ED Provider Notes (Addendum)
[facility] EMERGENCY DEPARTMENT Provider Note   CSN: [id number] Arrival date & time: 11/07/2020  0859     History Chief Complaint  Patient presents with   Near Syncopal Episode    Tammie West is a 87 y.o. female with past medical history of subclavian artery bypass, hypertension, diabetes, history of tobacco use, heart failure with preserved ejection fraction (EF of 65 to 70% and 2021), Diabetes type 2, peripheral vascular disease status post stenting of left mid SFA, and hyperlipidemia who presents due to near syncopal episode followed by nausea and vomiting this morning and complaints of abdominal distention with no bowel movement in 7 days.  Patient states that she woke up this morning around 4 AM due to chills and her husband had to pile several quilts on her to stop her from shivering.  She then had an episode of near syncope, per EMS where she was out of it for about 15 minutes.  He daughter took her temperature multiple times this morning with consistent elevated temperatures with highest reading at 102.3.  She takes opioids for her chronic back pain and is on a bowel regimen.  She has been constipated for the last week.  She endorses abdominal pain, nausea, and vomiting that started this morning.  She denies dysuria or urinary frequency.  States that at baseline she has a decreased appetite and it has not changed recently.   Past Medical History:  Diagnosis Date   Bilateral carpal tunnel syndrome 02/17/2017   Carotid artery disease (HCC)    1-39% bilateral stenosis   Carpal tunnel syndrome    CKD (chronic kidney disease) stage 4, GFR 15-29 ml/min (HCC)    Coronary artery disease 11/2014    cath with Prox RCA to Mid RCA lesion, 45% stenosed and Ost LAD to Prox LAD lesion, 30% stenosed byt recent cath and high calcium score by coronary CTA.   Crohn's disease ([place])    Depression    Diabetes mellitus without complication ([place])    GERD (gastroesophageal  reflux disease)    Goiter    Hyperlipidemia    Hypertension    Hypothyroidism    Insomnia    Migraine    Mitral regurgitation    mild to moderate by echo 04/2017   Osteoarthritis    Osteoporosis    PVD (peripheral vascular disease) ([place])    -s/p stenting of the 99% left mid SFA - followed by Dr. 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/07/2020   Age-related osteoporosis without current pathological fracture 02/07/2020   Chronic kidney disease, stage 4 (severe) ([place]) 02/07/2020   Chronic pain syndrome 02/07/2020   Conductive hearing loss, bilateral 02/07/2020   Diabetic foot ulcer ([place]) 02/07/2020   Dyslipidemia 02/07/2020   Encounter for immunization 02/07/2020   Gout 02/07/2020   Heart failure ([place]) 02/07/2020   Hyperglycemia due to type 2 diabetes mellitus ([place]) 02/07/2020   Long term (current) use of insulin ([place]) 02/07/2020   Loss of appetite 02/07/2020   Malnutrition of moderate degree ([place]: 60% to less than 75% of standard weight) ([place]) 02/07/2020   Recurrent major depression in remission ([place]) 02/07/2020   Skin sensation disturbance 02/07/2020   Vitamin D deficiency 02/07/2020   Subclavian artery stenosis, left ([place]) 01/12/2020   Stenosis of left  subclavian artery ([place]) 11/15/2019   Carotid artery disease (HCC)    PVD (peripheral vascular disease) (HCC)    Critical limb ischemia with history of revascularization of same extremity ([place]) 03/31/2019   Pain in right hand 05/29/2017   Pain of left hand 04/21/2017   Bilateral carpal tunnel syndrome 02/17/2017   Mitral regurgitation 01/18/2017   Chronic idiopathic constipation 12/16/2016   Intestinal gas excretion 12/16/2016   Chronic diastolic CHF (congestive heart failure) ([place]) 07/18/2016   Bilateral lower extremity edema 05/05/2016    Laryngopharyngeal reflux (LPR) 06/29/2015   Referred otalgia of left ear 06/29/2015   Angina effort    CAD (coronary artery disease), native coronary artery 11/28/2014   Tobacco abuse 09/11/2014   Chest pain 02/23/2013   Diabetes mellitus without complication (HCC)    Hypertension    Crohn's disease ([place])    Insomnia    Hypothyroidism    Goiter    Osteoporosis    Hyperlipidemia with target LDL less than 70    Depression    Carpal tunnel syndrome    Renal failure    GERD (gastroesophageal reflux disease)    Osteoarthritis    Trochanteric bursitis    Migraine     Past Surgical History:  Procedure Laterality Date   ABDOMINAL HYSTERECTOMY     APPENDECTOMY     CARDIAC CATHETERIZATION N/A 12/13/2014   Procedure: Left Heart Cath and Coronary Angiography;  Surgeon: Belva Crome, MD;  Location: [facility];  Service: Cardiovascular;  Laterality: N/A;   CAROTID-SUBCLAVIAN BYPASS GRAFT Left 01/12/2020   Procedure: LEFT COMMON CAROTID-SUBCLAVIAN ARTERY BYPASS WITH GRAFT;  Surgeon: Waynetta Sandy, MD;  Location: [facility];  Service: Vascular;  Laterality: Left;   CATARACT EXTRACTION W/ INTRAOCULAR LENS  IMPLANT, BILATERAL Bilateral    LOWER EXTREMITY ANGIOGRAPHY Left 04/04/2019   Procedure: LOWER EXTREMITY ANGIOGRAPHY;  Surgeon: Lorretta Harp, MD;  Location: [facility];  Service: Cardiovascular;  Laterality: Left;   PERIPHERAL VASCULAR INTERVENTION Left 04/04/2019   Procedure: PERIPHERAL VASCULAR INTERVENTION;  Surgeon: Lorretta Harp, MD;  Location: [facility];  Service: Cardiovascular;  Laterality: Left;   UPPER EXTREMITY ANGIOGRAPHY N/A 12/19/2019   Procedure: UPPER EXTREMITY ANGIOGRAPHY;  Surgeon: Lorretta Harp, MD;  Location: [facility];  Service: Cardiovascular;  Laterality: N/A;     OB History   No obstetric history on file.     Family History  Problem Relation Age of Onset   Emphysema Father    CAD Brother    Heart disease  Brother    Heart attack Neg Hx    Stroke Neg Hx     Social History   Tobacco Use   Smoking status: Some Days    Packs/day: 0.25    Types: Cigarettes   Smokeless tobacco: Never   Tobacco comments:    per patient: smokes about 2-3 cigarettes per  day  Vaping Use   Vaping Use: Never used  Substance Use Topics   Alcohol use: No   Drug use: No    Home Medications Prior to Admission medications   Medication Sig Start Date End Date Taking? Authorizing Provider  Accu-Chek FastClix Lancets MISC for use when checking blood sugars Dx E.11.9 06/02/12   [provider]  Accu-Chek FastClix Lancets MISC for use when checking blood sugars Dx E.11.9 06/02/12   [provider]  allopurinol (ZYLOPRIM) 100 MG tablet Take 1 tablet by mouth once daily for 30 days  [provider]  amitriptyline (ELAVIL) 25 MG tablet 1 tablet at bedtime.    [provider]  amLODipine (NORVASC) 5 MG tablet Take 1 tablet by mouth once daily for 90 days    [provider]  aspirin EC 81 MG tablet 1 tablet    [provider]  Blood Glucose Monitoring Suppl (ACCU-CHEK NANO SMARTVIEW) w/Device KIT See admin instructions. 06/02/12   [provider]  Blood Glucose Monitoring Suppl ([place]) w/Device KIT See admin instructions. 06/02/12   [provider]  Cholecalciferol (VITAMIN D) 50 MCG (2000 UT) tablet 1 tablet    [provider]  clidinium-chlordiazePOXIDE (LIBRAX) 5-2.5 MG capsule Take 1 capsule by mouth 2 (two) times daily.    [provider]  clopidogrel (PLAVIX) 75 MG tablet 1 tablet    [provider]  colchicine 0.6 MG tablet 1 tablet 01/25/19   [provider]  escitalopram (LEXAPRO) 20 MG tablet take 1 tablet by mouth once daily for 90 days    [provider]  furosemide (LASIX) 20 MG tablet 1 tablet    [provider]  glucose blood test strip for use when checking blood  sugars/ Dx E11.9 06/02/12   [provider]  insulin glargine (LANTUS SOLOSTAR) 100 UNIT/ML Solostar Pen 3 units 06/05/20   [provider]  Insulin Pen Needle (BD PEN NEEDLE NANO U/F) 32G X 4 MM MISC USE 1 TO 2 PEN NEEDLES DAILY WITH INSULIN INJECTIONS    [provider]  levothyroxine (EUTHYROX) 88 MCG tablet 1 tablet in the morning on an empty stomach    [provider]  levothyroxine (SYNTHROID) 100 MCG tablet  03/13/20   [provider]  levothyroxine (SYNTHROID) 88 MCG tablet  03/09/20   [provider]  lidocaine (XYLOCAINE) 5 % ointment 1 application as needed    [provider]  lisinopril (ZESTRIL) 5 MG tablet 1 tablet    [provider]  mesalamine (LIALDA) 1.2 g EC tablet 2 tablets with a meal    [provider]  metoprolol succinate (TOPROL-XL) 50 MG 24 hr tablet Take 50 mg by mouth at bedtime. 07/14/19   [provider]  morphine (MS CONTIN) 15 MG 12 hr tablet Take 15 mg by mouth 2 (two) times daily as needed. 02/06/20   [provider]  Morphine Sulfate ER (MORPHABOND ER) 15 MG T12A 1 tablet    [provider]  mupirocin ointment (BACTROBAN) 2 % Apply to affected toe once daily. 04/24/20   Marzetta Board, DPM  naloxone (NARCAN) nasal spray 4 mg/0.1 mL 1 GRAM AS NEEDED AS NEEDED 03/06/20   [provider]  nitroGLYCERIN (NITROSTAT) 0.4 MG SL tablet Place 1 tablet (0.4 mg total) under the tongue every 5 (five) minutes as needed for chest pain. 11/15/19   Lorretta Harp, MD  omeprazole (PRILOSEC) 20 MG capsule 1 capsule 30 minutes before morning meal    [provider]  Oxycodone HCl 10 MG TABS Take 10 mg by mouth every 4 (four) hours as needed (breakthrough pain).  07/11/17   [provider]  Oxycodone HCl 10 MG TABS 1 tablet    [provider]  polyethylene glycol (MIRALAX / GLYCOLAX) packet Take 17 g by mouth daily as needed for mild  constipation.    [provider]  Polyethylene Glycol 3350 (MIRALAX PO)     [provider]  Probiotic Product (ALIGN PO) See admin instructions.  [provider]  Probiotic Product (ALIGN) 4 MG CAPS Take 1 capsule by mouth daily after breakfast.     [provider]  simvastatin (ZOCOR) 10 MG tablet 1 tablet in the evening    [provider]    Allergies    Atorvastatin, Cefaclor, Codeine, Dexlansoprazole, Erythromycin, Exenatide, Fosamax [alendronate sodium], Fosamax [alendronate], Ibuprofen, Macrobid [nitrofurantoin monohyd macro], Metformin and related, Metformin hcl, Nitrofurantoin, Nsaids, Other, Penicillin g, Penicillins, Rosuvastatin, Shellfish-derived products, Tolmetin, and Shellfish allergy  Review of Systems   Review of Systems  Constitutional:  Positive for activity change, chills and fever.  Respiratory:  Negative for shortness of breath.   Cardiovascular:  Negative for chest pain.  Gastrointestinal:  Positive for abdominal pain, constipation, nausea and vomiting.  Genitourinary:  Negative for difficulty urinating and dysuria.  Neurological:  Positive for light-headedness.  Hematological:  Bruises/bleeds easily.  Psychiatric/Behavioral: Negative.     Physical Exam Updated Vital Signs BP (!) 90/58 (BP Location: Left Arm)   Pulse (!) 44   Temp 98.5 F (36.9 C) (Oral)   Resp 18   SpO2 (!) 70%   Physical Exam Constitutional:      Appearance: Normal appearance.  HENT:     Head: Normocephalic and atraumatic.     Nose: Nose normal. No congestion or rhinorrhea.     Mouth/Throat:     Mouth: Mucous membranes are dry.  Eyes:     Conjunctiva/sclera: Conjunctivae normal.  Cardiovascular:     Rate and Rhythm: Regular rhythm. Tachycardia present.     Heart sounds: Murmur heard.     Comments: DP pulses 1+ in left lower extremity and 0 in right lower extremity  Pulmonary:     Effort: Pulmonary effort is normal.     Breath  sounds: Wheezing present.  Abdominal:     General: There is distension.     Tenderness: There is abdominal tenderness. There is no guarding.     Comments: Hypoactive bowel sounds  Musculoskeletal:     Comments: Trace edema present bilaterally  Skin:    General: Skin is warm and dry.  Neurological:     General: No focal deficit present.     Mental Status: She is alert.  Psychiatric:        Mood and Affect: Mood normal.        Behavior: Behavior normal.    ED Results / Procedures / Treatments   Labs (all labs ordered are listed, but only abnormal results are displayed) Labs Reviewed - No data to display  EKG EKG Interpretation  Date/Time:  Wednesday November 14 2020 09:00:14 EDT Ventricular Rate:  102 PR Interval:  154 QRS Duration: 137 QT Interval:  360 QTC Calculation: 469 R Axis:   -26 Text Interpretation: Sinus tachycardia Atrial premature complexes Right bundle branch block new Abnormal T, consider ischemia, lateral leads Borderline ST elevation, inferior leads Confirmed by Blanchie Dessert [phone]) on 11/01/2020 9:10:10 AM  Radiology No results found.  Medications Ordered in ED Medications - No data to display  ED Course  I have reviewed the triage vital signs and the nursing notes.  Pertinent labs & imaging results that were available during my care of the patient were reviewed by me and considered in my medical decision making (see chart for details).    MDM Rules/Calculators/A&P                         Patient presents due to near syncopal episode this morning.  Daughter reports multiple reading of elevated temp with highest of 102.3 this morning.  In the ED, she was found to be hypoxic down to 86% and was placed on nasal cannula.  Blood pressure low normal at 90/58 with pulse of 44.Physical exam significant for poor skin turgor, expiratory wheezing with hypoxia, distended abdomen with diffuse tenderness, small amount of stool present in rectal vault and trace  edema in legs bilaterally.  Her presentation is concerning for sepsis due to pneumonia with hypoxia, hypotension, and CXR showing possible left perihilar lung base opacities.   She does not require oxygen at home, but is on 2L of oxygen now. On lab evaluation, lactic acid elevated at 2.9, creatinine of 1.43 with GFR of 36.  Likely episode of near syncope related to low blood pressure.  CT showed confluent left lung base pneumonia with large volume retained stool throughout redundant large bowl, including an 8 cm diameter stool ball in rectum as well as superimposed distended urinary bladder.   Patient will require admission.  Initiated code sepsis and started Community acquired Pneumonia coverage with Ceftriaxone and Doxycycline per pharmacy.  Will also complete imaging of her abdomen due to history of constipation for 7 days and diffuse, difficult to localize abdominal pain. On rectal exam, small amount of stool present with external hemorrhoids.   Discussed this case with my attending physician who cosigned this note including patient's presenting symptoms, physical exam plan diagnostic interventions.  Attending physician is in agreement with plan and made changes which were implemented.  Attending physician assessed patient at bedside.    Final Clinical Impression(s) / ED Diagnoses Final diagnoses:  None    Rx / DC Orders ED Discharge Orders     None        Sahvannah Rieser, Joellen [place], DO 11/01/2020 [place], [place], DO 11/06/2020 1340    Blanchie Dessert, MD 11/21/20 1520

## 2020-11-15 ENCOUNTER — Inpatient Hospital Stay (HOSPITAL_COMMUNITY): Payer: Medicare HMO

## 2020-11-15 DIAGNOSIS — R0603 Acute respiratory distress: Secondary | ICD-10-CM | POA: Diagnosis not present

## 2020-11-15 DIAGNOSIS — J189 Pneumonia, unspecified organism: Secondary | ICD-10-CM | POA: Diagnosis not present

## 2020-11-15 DIAGNOSIS — A419 Sepsis, unspecified organism: Secondary | ICD-10-CM | POA: Diagnosis not present

## 2020-11-15 LAB — ECHOCARDIOGRAM COMPLETE
AR max vel: 1.24 cm2
AV Area VTI: 1.43 cm2
AV Area mean vel: 1.21 cm2
AV Mean grad: 5 mmHg
AV Peak grad: 9.5 mmHg
Ao pk vel: 1.54 m/s
Area-P 1/2: 2.83 cm2
Height: 64 in
MV M vel: 4.85 m/s
MV Peak grad: 94.1 mmHg
S' Lateral: 2.7 cm
Weight: 1632 oz

## 2020-11-15 LAB — STREP PNEUMONIAE URINARY ANTIGEN: Strep Pneumo Urinary Antigen: POSITIVE — AB

## 2020-11-15 LAB — HEMOGLOBIN AND HEMATOCRIT, BLOOD
HCT: 27.8 % — ABNORMAL LOW (ref 36.0–46.0)
Hemoglobin: 8.6 g/dL — ABNORMAL LOW (ref 12.0–15.0)

## 2020-11-15 LAB — CBC
HCT: 27.5 % — ABNORMAL LOW (ref 36.0–46.0)
Hemoglobin: 8.8 g/dL — ABNORMAL LOW (ref 12.0–15.0)
MCH: 31.8 pg (ref 26.0–34.0)
MCHC: 32 g/dL (ref 30.0–36.0)
MCV: 99.3 fL (ref 80.0–100.0)
Platelets: 270 10*3/uL (ref 150–400)
RBC: 2.77 MIL/uL — ABNORMAL LOW (ref 3.87–5.11)
RDW: 15.8 % — ABNORMAL HIGH (ref 11.5–15.5)
WBC: 7.3 10*3/uL (ref 4.0–10.5)
nRBC: 0 % (ref 0.0–0.2)

## 2020-11-15 LAB — URINE CULTURE

## 2020-11-15 LAB — LACTIC ACID, PLASMA: Lactic Acid, Venous: 2.6 mmol/L (ref 0.5–1.9)

## 2020-11-15 LAB — COMPREHENSIVE METABOLIC PANEL WITH GFR
ALT: 57 U/L — ABNORMAL HIGH (ref 0–44)
AST: 96 U/L — ABNORMAL HIGH (ref 15–41)
Albumin: 2.2 g/dL — ABNORMAL LOW (ref 3.5–5.0)
Alkaline Phosphatase: 51 U/L (ref 38–126)
Anion gap: 7 (ref 5–15)
BUN: 27 mg/dL — ABNORMAL HIGH (ref 8–23)
CO2: 22 mmol/L (ref 22–32)
Calcium: 8.1 mg/dL — ABNORMAL LOW (ref 8.9–10.3)
Chloride: 110 mmol/L (ref 98–111)
Creatinine, Ser: 1.05 mg/dL — ABNORMAL HIGH (ref 0.44–1.00)
GFR, Estimated: 52 mL/min — ABNORMAL LOW
Glucose, Bld: 67 mg/dL — ABNORMAL LOW (ref 70–99)
Potassium: 3.5 mmol/L (ref 3.5–5.1)
Sodium: 139 mmol/L (ref 135–145)
Total Bilirubin: 0.5 mg/dL (ref 0.3–1.2)
Total Protein: 4.3 g/dL — ABNORMAL LOW (ref 6.5–8.1)

## 2020-11-15 LAB — GLUCOSE, CAPILLARY
Glucose-Capillary: 51 mg/dL — ABNORMAL LOW (ref 70–99)
Glucose-Capillary: 50 mg/dL — ABNORMAL LOW (ref 70–99)
Glucose-Capillary: 164 mg/dL — ABNORMAL HIGH (ref 70–99)
Glucose-Capillary: 83 mg/dL (ref 70–99)
Glucose-Capillary: 80 mg/dL (ref 70–99)
Glucose-Capillary: 71 mg/dL (ref 70–99)
Glucose-Capillary: 78 mg/dL (ref 70–99)

## 2020-11-15 LAB — CORTISOL: Cortisol, Plasma: 70.8 ug/dL

## 2020-11-15 LAB — OCCULT BLOOD X 1 CARD TO LAB, STOOL: Fecal Occult Bld: NEGATIVE

## 2020-11-15 MED ORDER — LIDOCAINE 5 % EX OINT
TOPICAL_OINTMENT | Freq: Every day | CUTANEOUS | Status: DC | PRN
  Filled 2020-11-15: qty 35.44

## 2020-11-15 MED ORDER — DEXTROSE 50 % IV SOLN
INTRAVENOUS | Status: AC
  Administered 2020-11-15: 50 mL
  Filled 2020-11-15: qty 50

## 2020-11-15 MED ORDER — ALBUMIN HUMAN 25 % IV SOLN
25.0000 g | Freq: Once | INTRAVENOUS | Status: AC
  Administered 2020-11-15: 25 g via INTRAVENOUS
  Filled 2020-11-15: qty 100

## 2020-11-15 MED ORDER — PANTOPRAZOLE SODIUM 40 MG IV SOLR
40.0000 mg | INTRAVENOUS | Status: DC
  Administered 2020-11-15: 40 mg via INTRAVENOUS
  Filled 2020-11-15: qty 40

## 2020-11-15 MED ORDER — SODIUM CHLORIDE 0.9 % IV SOLN
100.0000 mg | Freq: Two times a day (BID) | INTRAVENOUS | Status: AC
  Administered 2020-11-15 – 2020-11-16 (×3): 100 mg via INTRAVENOUS
  Filled 2020-11-15 (×3): qty 100

## 2020-11-15 MED ORDER — LEVOTHYROXINE SODIUM 100 MCG/5ML IV SOLN: 75.0000 ug | Freq: Every day | INTRAVENOUS | Status: DC

## 2020-11-15 MED ORDER — LACTATED RINGERS IV BOLUS
1000.0000 mL | Freq: Once | INTRAVENOUS | Status: AC
  Administered 2020-11-15: 1000 mL via INTRAVENOUS

## 2020-11-15 MED ORDER — IPRATROPIUM-ALBUTEROL 0.5-2.5 (3) MG/3ML IN SOLN
3.0000 mL | Freq: Four times a day (QID) | RESPIRATORY_TRACT | Status: DC
  Administered 2020-11-15 – 2020-11-16 (×5): 3 mL via RESPIRATORY_TRACT
  Filled 2020-11-15 (×5): qty 3

## 2020-11-15 MED ORDER — LEVOTHYROXINE SODIUM 25 MCG PO TABS: 125.0000 ug | ORAL_TABLET | Freq: Every day | ORAL | Status: DC

## 2020-11-15 MED ORDER — ALBUMIN HUMAN 25 % IV SOLN
25.0000 g | INTRAVENOUS | Status: AC
  Administered 2020-11-15 – 2020-11-16 (×2): 25 g via INTRAVENOUS
  Filled 2020-11-15 (×2): qty 100

## 2020-11-15 MED ORDER — DEXTROSE 5 % IV SOLN: INTRAVENOUS | Status: DC

## 2020-11-15 NOTE — Progress Notes (Signed)
Cross-coverage note:   Patient seen for hypotension.   She presented with near-syncope and chills, was found to be febrile and hypotensive, and was admitted with suspected sepsis from pneumonia.   BP had improved initially but SBP now 80s after 4 liters IVF and 25 g albumin.   She is fully oriented, denies chest pain or lightheadedness, and does not have any overt bleeding.   Discussed the situation with patient, including possible critical care consultation for vasopressors but she is not sure that she would want any aggressive measures and asks that we try more IVF for now. Given that she is asymptomatic and creatinine and lactate are improving, will continue conservative management for now and continue goals of care/code status discussion.

## 2020-11-15 NOTE — Progress Notes (Signed)
Hypoglycemic Event  CBG: 50  Treatment: D50 50 mL (25 gm)  Symptoms: None  Follow-up CBG: [id number] CBG Result:164  Possible Reasons for Event: Inadequate meal intake  Comments/MD notified:Dr Nell Range, Lennie Muckle

## 2020-11-15 NOTE — Progress Notes (Signed)
Pt family has discussed her code status to be changed. Tammie West POA at the bedside now and has requested for pt code to be changed to DNR effective immediately. Pade Dr Myna Hidalgo MD via Shea Evans.

## 2020-11-15 NOTE — Progress Notes (Signed)
2D echocardiogram completed.  11/15/2020 4:46 PM Kelby Aline., MHA, RVT, RDCS, RDMS

## 2020-11-15 NOTE — Progress Notes (Signed)
CPT w/ light pressure done, pt only tolerated for 5 minutes.  Pt was mouth breathing during therapy and sat dropped to 86%.  Pt placed on NRB, sat improved to 98%  Deep oral sx for small amount thick tan secretions.  Per pt, this helped some but she was concerned she would vomit.  Oral deep sx stopped.  Breath sounds still w/ rhonchi but w/ some improvement.  Sat 98% on NRB.   Also, noted gum stuck to roof of pt's top denture.  Daughter in room removed dentures and cleaned them.   RN in room and aware of above noted.

## 2020-11-15 NOTE — Progress Notes (Signed)
11/15/2020 Bladder san was perform by nurse tech at 11 am she had 167. Tammie Sheehan RN

## 2020-11-15 NOTE — Progress Notes (Addendum)
Progress Note    Tammie West  ZOX:[id number] DOB: July 13, 1932  DOA: 11/20/2020 PCP: [name], MD    Brief Narrative:     Medical records reviewed and are as summarized below:  Tammie West is an 87 y.o. female with medical history significant of hypertension, hyperlipidemia, CAD, hypothyroidism, diabetes mellitus type 2, peripheral vascular disease, chronic pain, CKD stage III presents after being unable to stand or walk this morning.  Found to have PNA.  Strep PNA antigen positive.   Assessment/Plan:   Principal Problem:   Sepsis due to pneumonia [facility]) Active Problems:   Diabetes mellitus without complication ([place])   Hypothyroidism   Acute kidney injury superimposed on chronic kidney disease (HCC)   Chronic diastolic CHF (congestive heart failure) (HCC)   Chronic pain syndrome   Fecal impaction in rectum (HCC)   Transient hypotension   Near syncope   Hydronephrosis   Acute respiratory failure with hypoxia due to severe sepsis secondary to pneumonia:  -fever up to 102.4 F at home prior to arrival.   - O2 saturations were noted to be 70 % on room air for which patient was placed on 3 L nasal cannula oxygen oxygen with improvement in O2 saturations to greater than 92%.  Imaging studies significant for a left-sided pneumonia. -Admit to a progressive bed -Continuous pulse oximetry with nasal cannula oxygen maintain O2 saturation greater than 92% -Follow-up blood culture -procal elevated -Incentive spirometry and flutter valve -Continue empiric antibiotics Rocephin and doxycycline- as x ray shows some worsening, may need to broaden out abx -Mucinex -echo   Near syncope transient hypotension: Acute.   Blood pressures initially as low as 76/52 with some improvement after IV fluid boluses.   -hold home meds   Acute kidney injury superimposed on chronic kidney disease stage IIIa  hydronephrosis:  -In and out cath as needed in place   -stable   Fecal impaction: Acute.  Patient reports no bowel movement in over a week.  Was noted to have a 8 cm stool ball in the rectum present on CT imaging of the abdomen pelvis.  -Smog enema -Senokot-S twice daily   Diastolic congestive heart: Chronic last echocardiogram revealed EF of 65-70% with grade 1 diastolic function in 08/1189. -Strict intake and output and daily weight   Chronic pain:  -hold long acting   Crohn's disease -Continue mesalamine, probiotics   Diabetes mellitus type 2:  -CBGs before every meal with sensitive SSI -hold long acting due to poor PO intake   Depression -Continue Elavil and Lexapro   Hypothyroidism -TSH elevated -increase synthroid   Transaminitis: Acute.  On admission AST 135, ALT 73, and total bilirubin within normal limits at 0.3. -trending down   Underweight  hypoalbuminemia: Acute.  Patient is underweight with BMI 17.51 kg/m.  She reports poor p.o. intake due to lack of appetite.  Albumin 2.4 on admission       Family Communication/Anticipated D/C date and plan/Code Status   DVT prophylaxis: Lovenox ordered. Code Status: Full Code.  Family Communication: daughter at bedside Disposition Plan: Status is: Inpatient  Remains inpatient appropriate because: sick         Medical Consultants:   None.    Subjective:   Will awaken and answer questions, no voiced complaints   Objective:    Vitals:   11/15/20 0500 11/15/20 0520 11/15/20 0744 11/15/20 1214  BP: (!) 86/47 (!) 86/47 (!) 99/47 (!) 81/44  Pulse: 94 88 87 82  Resp: 19  20 17 17   Temp: 98.1 F (36.7 C) 98.3 F (36.8 C) 98.7 F (37.1 C) 98.2 F (36.8 C)  TempSrc: Oral Oral Axillary Axillary  SpO2: 100% 100% 100% 98%  Weight:      Height:        Intake/Output Summary (Last 24 hours) at 11/15/2020 1421 Last data filed at 11/15/2020 0453 Gross per 24 hour  Intake 2754.97 ml  Output 650 ml  Net 2104.97 ml   Filed Weights   10/30/2020 1007  Weight:  46.3 kg    Exam:  General: Appearance:    Thin female in no acute distress     Lungs:     Coarse breath sounds, on [place], respirations unlabored- able to cough but not able to produce  Heart:    Normal heart rate.   MS:   All extremities are intact.    Neurologic:   Will awaken, follows commands     Data Reviewed:   I have personally reviewed following labs and imaging studies:  Labs: Labs show the following:   Basic Metabolic Panel: Recent Labs  Lab 11/13/2020 0942 11/15/20 0307  NA 141 139  K 3.6 3.5  CL 107 110  CO2 25 22  GLUCOSE 83 67*  BUN 35* 27*  CREATININE 1.43* 1.05*  CALCIUM 8.8* 8.1*   GFR Estimated Creatinine Clearance: 28.1 mL/min (A) (by C-G formula based on SCr of 1.05 mg/dL (H)). Liver Function Tests: Recent Labs  Lab 11/06/2020 0942 11/15/20 0307  AST 135* 96*  ALT 73* 57*  ALKPHOS 103 51  BILITOT 0.3 0.5  PROT 5.3* 4.3*  ALBUMIN 2.4* 2.2*   No results for input(s): LIPASE, AMYLASE in the last 168 hours. No results for input(s): AMMONIA in the last 168 hours. Coagulation profile Recent Labs  Lab 10/29/2020 0942  INR 1.3*    CBC: Recent Labs  Lab 10/31/2020 0942 11/15/20 0307 11/15/20 1007  WBC 5.9 7.3  --   NEUTROABS 5.2  --   --   HGB 12.3 8.8* 8.6*  HCT 38.6 27.5* 27.8*  MCV 99.5 99.3  --   PLT 377 270  --    Cardiac Enzymes: No results for input(s): CKTOTAL, CKMB, CKMBINDEX, TROPONINI in the last 168 hours. BNP (last 3 results) No results for input(s): PROBNP in the last 8760 hours. CBG: Recent Labs  Lab 10/31/2020 2149 11/15/20 0742 11/15/20 0850 11/15/20 0939 11/15/20 1217  GLUCAP 80 51* 50* 164* 83   D-Dimer: No results for input(s): DDIMER in the last 72 hours. Hgb A1c: No results for input(s): HGBA1C in the last 72 hours. Lipid Profile: No results for input(s): CHOL, HDL, LDLCALC, TRIG, CHOLHDL, LDLDIRECT in the last 72 hours. Thyroid function studies: Recent Labs    11/21/2020 1338  TSH 159.296*   Anemia work  up: No results for input(s): VITAMINB12, FOLATE, FERRITIN, TIBC, IRON, RETICCTPCT in the last 72 hours. Sepsis Labs: Recent Labs  Lab 11/19/2020 0942 11/04/2020 1238 11/10/2020 1338 10/27/2020 1440 11/23/2020 1621 11/06/2020 2328 11/15/20 0307  PROCALCITON  --   --  6.20  --   --   --   --   WBC 5.9  --   --   --   --   --  7.3  LATICACIDVEN 2.9* 3.8*  --  2.8* 3.4* 2.6*  --     Microbiology Recent Results (from the past 240 hour(s))  Urine Culture     Status: Abnormal   Collection Time: 11/11/2020 10:06 AM   Specimen:  In/Out Cath Urine  Result Value Ref Range Status   Specimen Description IN/OUT CATH URINE  Final   Special Requests   Final    NONE Performed at [facility], [address], [place], [place] [address]    Culture MULTIPLE SPECIES PRESENT, SUGGEST RECOLLECTION (A)  Final   Report Status 11/15/2020 FINAL  Final  Resp Panel by RT-PCR (Flu A&B, Covid) Nasopharyngeal Swab     Status: None   Collection Time: 10/30/2020 10:12 AM   Specimen: Nasopharyngeal Swab; Nasopharyngeal(NP) swabs in vial transport medium  Result Value Ref Range Status   SARS Coronavirus 2 by RT PCR NEGATIVE NEGATIVE Final    Comment: (NOTE) SARS-CoV-2 target nucleic acids are NOT DETECTED.  The SARS-CoV-2 RNA is generally detectable in upper respiratory specimens during the acute phase of infection. The lowest concentration of SARS-CoV-2 viral copies this assay can detect is 138 copies/mL. A negative result does not preclude SARS-Cov-2 infection and should not be used as the sole basis for treatment or other patient management decisions. A negative result may occur with  improper specimen collection/handling, submission of specimen other than nasopharyngeal swab, presence of viral mutation(s) within the areas targeted by this assay, and inadequate number of viral copies(<138 copies/mL). A negative result must be combined with clinical observations, patient history, and epidemiological information.  The expected result is Negative.  Fact Sheet for Patients:  EntrepreneurPulse.com.au  Fact Sheet for Healthcare Providers:  IncredibleEmployment.be  This test is no t yet approved or cleared by the [place] FDA and  has been authorized for detection and/or diagnosis of SARS-CoV-2 by FDA under an Emergency Use Authorization (EUA). This EUA will remain  in effect (meaning this test can be used) for the duration of the COVID-19 declaration under Section 564(b)(1) of the Act, 21 U.S.C.section 360bbb-3(b)(1), unless the authorization is terminated  or revoked sooner.       Influenza A by PCR NEGATIVE NEGATIVE Final   Influenza B by PCR NEGATIVE NEGATIVE Final    Comment: (NOTE) The Xpert Xpress SARS-CoV-2/FLU/RSV plus assay is intended as an aid in the diagnosis of influenza from Nasopharyngeal swab specimens and should not be used as a sole basis for treatment. Nasal washings and aspirates are unacceptable for Xpert Xpress SARS-CoV-2/FLU/RSV testing.  Fact Sheet for Patients: EntrepreneurPulse.com.au  Fact Sheet for Healthcare Providers: IncredibleEmployment.be  This test is not yet approved or cleared by the [place] FDA and has been authorized for detection and/or diagnosis of SARS-CoV-2 by FDA under an Emergency Use Authorization (EUA). This EUA will remain in effect (meaning this test can be used) for the duration of the COVID-19 declaration under Section 564(b)(1) of the Act, 21 U.S.C. section 360bbb-3(b)(1), unless the authorization is terminated or revoked.  Performed at [facility], [place] [address] [place], [place] [address]   Blood culture (routine single)     Status: None (Preliminary result)   Collection Time: 11/18/2020 10:20 AM   Specimen: BLOOD  Result Value Ref Range Status   Specimen Description BLOOD SITE NOT SPECIFIED  Final   Special Requests   Final    BOTTLES DRAWN  AEROBIC AND ANAEROBIC Blood Culture adequate volume   Culture   Final    NO GROWTH 1 DAY Performed at [facility], [place] [address] [place], [place] [address]    Report Status PENDING  Incomplete  Culture, blood (single)     Status: None (Preliminary result)   Collection Time: 11/24/2020 12:10 PM  Specimen: BLOOD  Result Value Ref Range Status   Specimen Description BLOOD LEFT ANTECUBITAL  Final   Special Requests   Final    BOTTLES DRAWN AEROBIC AND ANAEROBIC Blood Culture adequate volume   Culture   Final    NO GROWTH < 24 HOURS Performed at [facility], [address], [place], [place] [address]    Report Status PENDING  Incomplete    Procedures and diagnostic studies:  CT ABDOMEN PELVIS WO CONTRAST  Result Date: 11/13/2020 CLINICAL DATA:  87 year old female with abdominal pain and fever. EXAM: CT ABDOMEN AND PELVIS WITHOUT CONTRAST TECHNIQUE: Multidetector CT imaging of the abdomen and pelvis was performed following the standard protocol without IV contrast. COMPARISON:  CT Abdomen and Pelvis 01/10/2012. CTA chest 12/02/2019. FINDINGS: Lower chest: Pleural effusion and round atelectasis suspected in the left lower lobe last year. But now there is consolidation throughout the visible left lower lobe and lingula, with new air bronchograms. Persistent small volume of pleural fluid in the costophrenic angle with associated pleural thickening (series 2, image 13). Increased volume loss in the left chest, mediastinum deviated to the left. No cardiomegaly or pericardial effusion. Negative right lung base. Hepatobiliary: Distended gallbladder, 11 cm in length. But no pericholecystic inflammation. Negative noncontrast liver. Pancreas: Chronically atrophied pancreas. Spleen: Diminutive.  Negative. Adrenals/Urinary Tract: Left nephrolithiasis, lower pole calculus up to 7 mm. The urinary bladder is distended despite mass effect from the colon, with an estimated bladder volume of 370 mL.  No bladder wall thickening. There is subtle right hydronephrosis and right hydroureter, which might also be related to mass effect on the distal ureter. Chronic pelvic phleboliths. No obstructing urinary calculus identified. Stomach/Bowel: Large volume of low-density stool retained throughout redundant colon, including a roughly 8 cm diameter stool ball in the rectum. The hepatic flexure is redundant but decompressed. No large bowel inflammation identified. Terminal ileum and small bowel loops are nondilated, although the distal stomach and proximal duodenum are distended with fluid. Oval medication or supplements in the gastric fundus. No free air, free fluid or convincing bowel inflammation. Vascular/Lymphatic: Extensive Aortoiliac calcified atherosclerosis. Vascular patency is not evaluated in the absence of IV contrast. No lymphadenopathy identified. Reproductive: Absent uterus.  Diminutive or absent ovaries. Other: No pelvic free fluid. Musculoskeletal: Chronic scoliosis and degeneration in the lower spine. No acute or suspicious osseous lesion identified. IMPRESSION: 1. 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 2. Large volume of retained stool throughout redundant large bowel, including a 8 cm diameter stool ball in the rectum. Consider Fecal Impaction. 3. Superimposed distended urinary bladder (370 mL) which might be the etiology of mild right hydronephrosis and hydroureter, versus mass effect on the distal ureter secondary to #2. Left nephrolithiasis but no obstructing calculus identified. 4. Dilated gallbladder, but no CT evidence of acute cholecystitis. 5. Aortic Atherosclerosis (ICD10-I70.0). Electronically Signed   By: Genevie Ann M.D.   On: 11/12/2020 11:57   DG CHEST PORT 1 VIEW  Result Date: 11/15/2020 CLINICAL DATA:  Shortness of breath, hypoxia. EXAM: PORTABLE CHEST 1 VIEW COMPARISON:  November 14, 2020. FINDINGS: Stable  cardiomediastinal silhouette. Stable large left lung opacity is noted concerning for pneumonia or atelectasis with associated pleural effusion. Increased right upper lobe opacity is noted concerning for worsening pneumonia. Bony thorax is unremarkable. IMPRESSION: Stable large left lung opacity is noted concerning for pneumonia or atelectasis with associated pleural effusion. Increased right upper lobe opacity is noted concerning for worsening  pneumonia. Aortic Atherosclerosis (ICD10-I70.0). Electronically Signed   By: Marijo Conception M.D.   On: 11/15/2020 13:56   DG Chest Port 1 View  Result Date: 10/27/2020 CLINICAL DATA:  Possible sepsis EXAM: PORTABLE CHEST 1 VIEW COMPARISON:  2017 FINDINGS: Left perihilar and lung base opacities. Right lung is clear. Possible small left pleural effusion. No pneumothorax. Similar cardiomediastinal contours. IMPRESSION: Left perihilar lung base opacities suspicious for pneumonia. Possible small left pleural effusion. Electronically Signed   By: Macy Mis M.D.   On: 11/15/2020 10:30    Medications:    amitriptyline  25 mg Oral QHS   aspirin EC  81 mg Oral QHS   clidinium-chlordiazePOXIDE  1 capsule Oral BID   doxycycline  100 mg Oral Q12H   enoxaparin (LOVENOX) injection  30 mg Subcutaneous Q24H   escitalopram  20 mg Oral Daily   guaiFENesin  600 mg Oral BID   insulin aspart  0-6 Units Subcutaneous TID WC   ipratropium-albuterol  3 mL Nebulization Q6H   levothyroxine  125 mcg Oral Q0600   mesalamine  1.2 g Oral BID   morphine  15 mg Oral BID   pantoprazole  40 mg Oral Daily   polyethylene glycol  8.5-17 g Oral QPM   senna-docusate  1 tablet Oral BID   simvastatin  10 mg Oral QPM   sodium chloride flush  3 mL Intravenous Q12H   Continuous Infusions:  albumin human     cefTRIAXone (ROCEPHIN)  IV 2 g (11/15/20 1138)     LOS: 1 day   Geradine Girt  [facility]   How to contact the [facility] Attending or Consulting provider [place] or  covering provider during after hours [place], for this patient?  Check the care team in [facility] and look for a) attending/consulting [facility] provider listed and b) the [facility] team listed Log into [email or web address] and use [place]'s universal password to access. If you do not have the password, please contact the hospital operator. Locate the [facility] provider you are looking for under [facility] and page to a number that you can be directly reached. If you still have difficulty reaching the provider, please page the [facility] (Director on Call) for the Hospitalists listed on amion for assistance.  11/15/2020, 2:21 PM

## 2020-11-16 DIAGNOSIS — A419 Sepsis, unspecified organism: Secondary | ICD-10-CM | POA: Diagnosis not present

## 2020-11-16 DIAGNOSIS — J154 Pneumonia due to other streptococci: Secondary | ICD-10-CM

## 2020-11-16 DIAGNOSIS — J189 Pneumonia, unspecified organism: Secondary | ICD-10-CM | POA: Diagnosis not present

## 2020-11-16 LAB — LEGIONELLA PNEUMOPHILA SEROGP 1 UR AG: L. pneumophila Serogp 1 Ur Ag: NEGATIVE

## 2020-11-16 LAB — CBC
HCT: 27.7 % — ABNORMAL LOW (ref 36.0–46.0)
Hemoglobin: 8.7 g/dL — ABNORMAL LOW (ref 12.0–15.0)
MCH: 32.1 pg (ref 26.0–34.0)
MCHC: 31.4 g/dL (ref 30.0–36.0)
MCV: 102.2 fL — ABNORMAL HIGH (ref 80.0–100.0)
Platelets: 237 10*3/uL (ref 150–400)
RBC: 2.71 MIL/uL — ABNORMAL LOW (ref 3.87–5.11)
RDW: 16.5 % — ABNORMAL HIGH (ref 11.5–15.5)
WBC: 19.7 10*3/uL — ABNORMAL HIGH (ref 4.0–10.5)
nRBC: 0 % (ref 0.0–0.2)

## 2020-11-16 LAB — BASIC METABOLIC PANEL
Anion gap: 9 (ref 5–15)
BUN: 27 mg/dL — ABNORMAL HIGH (ref 8–23)
CO2: 24 mmol/L (ref 22–32)
Calcium: 8.1 mg/dL — ABNORMAL LOW (ref 8.9–10.3)
Chloride: 108 mmol/L (ref 98–111)
Creatinine, Ser: 1.19 mg/dL — ABNORMAL HIGH (ref 0.44–1.00)
GFR, Estimated: 45 mL/min — ABNORMAL LOW (ref >60)
Glucose, Bld: 107 mg/dL — ABNORMAL HIGH (ref 70–99)
Potassium: 3.5 mmol/L (ref 3.5–5.1)
Sodium: 141 mmol/L (ref 135–145)

## 2020-11-16 LAB — PROCALCITONIN: Procalcitonin: 10.12 ng/mL

## 2020-11-16 LAB — GLUCOSE, CAPILLARY
Glucose-Capillary: 117 mg/dL — ABNORMAL HIGH (ref 70–99)
Glucose-Capillary: 141 mg/dL — ABNORMAL HIGH (ref 70–99)

## 2020-11-16 MED ORDER — OXYCODONE HCL 5 MG PO TABS
10.0000 mg | ORAL_TABLET | Freq: Once | ORAL | Status: AC | PRN
  Administered 2020-11-16: 10 mg via ORAL
  Filled 2020-11-16: qty 2

## 2020-11-16 MED ORDER — GLYCOPYRROLATE 0.2 MG/ML IJ SOLN
0.2000 mg | INTRAMUSCULAR | Status: DC | PRN
  Administered 2020-11-16: 0.2 mg via INTRAVENOUS
  Filled 2020-11-16: qty 1

## 2020-11-16 MED ORDER — CHLORHEXIDINE GLUCONATE CLOTH 2 % EX PADS
6.0000 | MEDICATED_PAD | Freq: Every day | CUTANEOUS | Status: DC
  Administered 2020-11-16: 6 via TOPICAL

## 2020-11-16 MED ORDER — GLYCOPYRROLATE 1 MG PO TABS: 1.0000 mg | ORAL_TABLET | ORAL | Status: DC | PRN

## 2020-11-16 MED ORDER — LORAZEPAM 1 MG PO TABS: 1.0000 mg | ORAL_TABLET | ORAL | Status: DC | PRN

## 2020-11-16 MED ORDER — LORAZEPAM 2 MG/ML IJ SOLN
1.0000 mg | INTRAMUSCULAR | Status: DC | PRN
  Administered 2020-11-16: 1 mg via INTRAVENOUS
  Filled 2020-11-16: qty 1

## 2020-11-16 MED ORDER — MORPHINE SULFATE (PF) 2 MG/ML IV SOLN
1.0000 mg | INTRAVENOUS | Status: DC | PRN
  Administered 2020-11-16 (×2): 1 mg via INTRAVENOUS
  Filled 2020-11-16 (×3): qty 1

## 2020-11-16 MED ORDER — LORAZEPAM 2 MG/ML PO CONC: 1.0000 mg | ORAL | Status: DC | PRN

## 2020-11-16 MED ORDER — GLYCOPYRROLATE 0.2 MG/ML IJ SOLN: 0.2000 mg | INTRAMUSCULAR | Status: DC | PRN

## 2020-11-19 LAB — CULTURE, BLOOD (SINGLE)
Culture: NO GROWTH
Culture: NO GROWTH
Special Requests: ADEQUATE
Special Requests: ADEQUATE

## 2020-11-20 ENCOUNTER — Ambulatory Visit: Payer: Medicare HMO | Admitting: Podiatry

## 2020-11-27 NOTE — Care Management Important Message (Signed)
Important Message  Patient Details  Name: Tammie West MRN: [id number] Date of Birth: [birth year]   Medicare Important Message Given:  Yes     Orbie Pyo December 09, 2020, 3:22 PM

## 2020-11-27 NOTE — Progress Notes (Signed)
Pt found to be lethargic minimally responsive to pain. Does not make eye contact. On Non-rebreather. RT at bedside for chest PT. Per current orders pt is DNR with no escalation of care. Of note, she is at high risk for aspiration.    Dec 08, 2020 0730  Oxygen Therapy  SpO2 95 %  O2 Device Non-rebreather Mask  O2 Flow Rate (L/min) 15 L/min  Pain Assessment  Pain Scale PAINAD  PAINAD (Pain Assessment in Advanced Dementia)  Breathing 1  Negative Vocalization 0  Facial Expression 0  Body Language 0  Consolability 0  PAINAD Score 1  Glasgow Coma Scale  Eye Opening 2  Best Verbal Response (NON-intubated) 2  Best Motor Response 4 (minimally)  Glasgow Coma Scale Score 8

## 2020-11-27 NOTE — Progress Notes (Signed)
Per discussion w/ MD, there are no plans for escalation in respiratory care at this time, no new interventions at this time.  Per MD, maintain current care.

## 2020-11-27 NOTE — Progress Notes (Signed)
Pt transitioned to comfort care only with daughter at bedside. Pt will be medicated prn for any signs of distress. Pt will be kept clean and dry. Repositioned prn and q 2 hrs.    12-16-2020 1451  Vitals  Pulse Rate 79  ECG Heart Rate 82  Resp (!) 23  MEWS COLOR  MEWS Score Color Comfort Care Only  Oxygen Therapy  SpO2 95 %  Pain Assessment  Pain Scale PAINAD  PAINAD (Pain Assessment in Advanced Dementia)  Breathing 1  Negative Vocalization 1  Facial Expression 0  Body Language 0  Consolability 1  PAINAD Score 3  MEWS Score  MEWS Temp 0  MEWS Systolic 0  MEWS Pulse 0  MEWS RR 1  MEWS LOC 0  MEWS Score 1

## 2020-11-27 NOTE — Progress Notes (Signed)
Provider consulted to evaluate pt at bedside. Tammie West remains very lethargic with AMS (unchanged since this am). Pt has minimal response to pain stimulus and is not able to protect her airway (weak-no cough). Pt deep suctioned and desaturated to 81% taking a long time to recover. Non-rebreather remains in place @ 15L. Pt sounds congested with course crackles throughout. Per daughter statement we will not be escalating care and she is waiting for palliative consult. This RN feels that pt should be given medication for comfort to aid with wob.      11-18-2020 1400  Provider Notification  Provider Name/Title Dr. Eliseo Squires  Date Provider Notified 2020-11-18  Time Provider Notified 1400  Notification Type Page  Notification Reason Change in status;Other (Comment) (pt seems weaker mentation has not improved RR > 22)  Provider response Other (Comment) (waiting)

## 2020-11-27 NOTE — Progress Notes (Signed)
Pt expired at 18:21 with daughter at bedside. Pt given morphine and ativan for comfort.     November 25, 2020 1821  Vitals  ECG Heart Rate (!) 31  Resp (!) 0  MEWS COLOR  MEWS Score Color Comfort Care Only  MEWS Score  MEWS Temp 0  MEWS Systolic 0  MEWS Pulse 2  MEWS RR 2  MEWS LOC 0  MEWS Score 4

## 2020-11-27 NOTE — Death Summary Note (Signed)
  DEATH SUMMARY   Patient Details  Name: Tammie West MRN: [id number] DOB: [birth year]  Admission/Discharge Information   Admit Date:  12-04-20  Date of Death: Date of Death: Dec 06, 2020  Time of Death: Time of Death: 05/09/1819  Length of Stay: 2  Code Status:   Referring Physician: [name], MD     Diagnoses  Preliminary cause of death:  Secondary Diagnoses (including complications and co-morbidities):  Principal Problem:   Sepsis due to pneumonia [facility]) Active Problems:   Diabetes mellitus without complication ([place])   Hypothyroidism   Acute kidney injury superimposed on chronic kidney disease ([place])   Chronic diastolic CHF (congestive heart failure) ([place])   Chronic pain syndrome   Fecal impaction in rectum ([place])   Transient hypotension   Near syncope   Hydronephrosis    Brief Hospital Course  Acute respiratory failure with hypoxia due to severe sepsis secondary to strep pneumonia:  -fever up to 102.4 F at home prior to arrival.   - O2 saturations were noted to be 70 % on room air for which patient was placed on 3 L nasal cannula oxygen oxygen with improvement in O2 saturations to greater than 92%.  Imaging studies significant for a left-sided pneumonia. + strep PNA -patient's breathing status has worsened.  Discussion had with MPOA/family and plan is transitioned to comfort care   Near syncope transient hypotension: Acute.   Blood pressures initially as low as 76/52 with some improvement after IV fluid boluses.   -transitioned to comfort care   Acute kidney injury superimposed on chronic kidney disease stage IIIa  hydronephrosis:  -foley placed   Fecal impaction: Acute.  Patient reports no bowel movement in over a week.  Was noted to have a 8 cm stool ball in the rectum present on CT imaging of the abdomen pelvis.  -s/p smog enema   Diastolic congestive heart: Chronic last echocardiogram revealed EF of 65-70% with grade 1 diastolic function in  08/879. -comfort focused care   Hypothyroidism -TSH elevated -focus on comfort   Transaminitis: Acute.  On admission AST 135, ALT 73, and total bilirubin within normal limits at 0.3. -trending down   Underweight  hypoalbuminemia: Acute.  Patient is underweight with BMI 17.51 kg/m.  She reports poor p.o. intake due to lack of appetite.  Albumin 2.4 on admission -transition to comfort care  Geradine Girt 11/19/2020, 3:43 PM

## 2020-11-27 NOTE — Progress Notes (Signed)
Per daughter at bedside, daughter wants to hold off CPT at this time for pt comfort reasons.  MD at bedside speaking with family.

## 2020-11-27 NOTE — Progress Notes (Signed)
Progress Note    Tammie West  IDP:[id number] DOB: 02-10-1932  DOA: 11/15/2020 PCP: [name], MD    Brief Narrative:     Medical records reviewed and are as summarized below:  Tammie West is an 87 y.o. female with medical history significant of hypertension, hyperlipidemia, CAD, hypothyroidism, diabetes mellitus type 2, peripheral vascular disease, chronic pain, CKD stage III presents after being unable to stand or walk this morning.  Found to have PNA.  Strep PNA antigen positive.  Poor overall prognosis.  Long discussion with her daughter, MPOA and decision made to change to comfort care.     Assessment/Plan:   Principal Problem:   Sepsis due to pneumonia [facility]) Active Problems:   Diabetes mellitus without complication ([place])   Hypothyroidism   Acute kidney injury superimposed on chronic kidney disease (HCC)   Chronic diastolic CHF (congestive heart failure) (HCC)   Chronic pain syndrome   Fecal impaction in rectum (HCC)   Transient hypotension   Near syncope   Hydronephrosis   Acute respiratory failure with hypoxia due to severe sepsis secondary to pneumonia:  -fever up to 102.4 F at home prior to arrival.   - O2 saturations were noted to be 70 % on room air for which patient was placed on 3 L nasal cannula oxygen oxygen with improvement in O2 saturations to greater than 92%.  Imaging studies significant for a left-sided pneumonia. + strep PNA -patient's breathing status has worsened.  Discussion had with MPOA/family and plan is transition to comfort care   Near syncope transient hypotension: Acute.   Blood pressures initially as low as 76/52 with some improvement after IV fluid boluses.   -transition to comfort care   Acute kidney injury superimposed on chronic kidney disease stage IIIa  hydronephrosis:  -foley placed   Fecal impaction: Acute.  Patient reports no bowel movement in over a week.  Was noted to have a 8 cm stool ball  in the rectum present on CT imaging of the abdomen pelvis.  -s/p smog enema   Diastolic congestive heart: Chronic last echocardiogram revealed EF of 65-70% with grade 1 diastolic function in 04/4313. -comfort focused care   Hypothyroidism -TSH elevated -focus on comfort   Transaminitis: Acute.  On admission AST 135, ALT 73, and total bilirubin within normal limits at 0.3. -trending down   Underweight  hypoalbuminemia: Acute.  Patient is underweight with BMI 17.51 kg/m.  She reports poor p.o. intake due to lack of appetite.  Albumin 2.4 on admission -transition to comfort care   Foley for end of life care   Family Communication/Anticipated D/C date and plan/Code Status   DVT prophylaxis: Lovenox ordered. Code Status: Full Code.  Family Communication: daughter at bedside Disposition Plan: Status is: Inpatient  Remains inpatient appropriate because: sick         Medical Consultants:   Palliative care    Subjective:   Less responsive this PM then this AM  Objective:    Vitals:   2020-11-26 0730 November 26, 2020 0800 11-26-20 1122 2020/11/26 1200  BP:  (!) 123/57 (!) 108/49 (!) 116/48  Pulse:  76 74 77  Resp:  20 20 (!) 21  Temp:  (!) 97.1 F (36.2 C) 97.8 F (36.6 C)   TempSrc:  Axillary Axillary   SpO2: 95% 90% 95% 95%  Weight:      Height:        Intake/Output Summary (Last 24 hours) at 11/26/20 1410 Last data filed at 11-26-20  1055 Gross per 24 hour  Intake 653 ml  Output 500 ml  Net 153 ml   Filed Weights   11/24/2020 1007  Weight: 46.3 kg    Exam:   General: Appearance:    Frail female in no acute distress     Lungs:     On non-re breather, lethargic, coarse breath sounds  Heart:    Normal heart rate.  MS:   All extremities are intact.    Neurologic:   Minimally responsive       Data Reviewed:   I have personally reviewed following labs and imaging studies:  Labs: Labs show the following:   Basic Metabolic Panel: Recent Labs  Lab  11/19/2020 0942 11/15/20 0307 November 23, 2020 0300  NA 141 139 141  K 3.6 3.5 3.5  CL 107 110 108  CO2 25 22 24   GLUCOSE 83 67* 107*  BUN 35* 27* 27*  CREATININE 1.43* 1.05* 1.19*  CALCIUM 8.8* 8.1* 8.1*   GFR Estimated Creatinine Clearance: 24.8 mL/min (A) (by C-G formula based on SCr of 1.19 mg/dL (H)). Liver Function Tests: Recent Labs  Lab 11/02/2020 0942 11/15/20 0307  AST 135* 96*  ALT 73* 57*  ALKPHOS 103 51  BILITOT 0.3 0.5  PROT 5.3* 4.3*  ALBUMIN 2.4* 2.2*   No results for input(s): LIPASE, AMYLASE in the last 168 hours. No results for input(s): AMMONIA in the last 168 hours. Coagulation profile Recent Labs  Lab 11/19/2020 0942  INR 1.3*    CBC: Recent Labs  Lab 11/24/2020 0942 11/15/20 0307 11/15/20 1007 11-23-2020 0300  WBC 5.9 7.3  --  19.7*  NEUTROABS 5.2  --   --   --   HGB 12.3 8.8* 8.6* 8.7*  HCT 38.6 27.5* 27.8* 27.7*  MCV 99.5 99.3  --  102.2*  PLT 377 270  --  237   Cardiac Enzymes: No results for input(s): CKTOTAL, CKMB, CKMBINDEX, TROPONINI in the last 168 hours. BNP (last 3 results) No results for input(s): PROBNP in the last 8760 hours. CBG: Recent Labs  Lab 11/15/20 1657 11/15/20 1907 11/15/20 2155 11/23/2020 0758 2020/11/23 1156  GLUCAP 80 71 78 117* 141*   D-Dimer: No results for input(s): DDIMER in the last 72 hours. Hgb A1c: No results for input(s): HGBA1C in the last 72 hours. Lipid Profile: No results for input(s): CHOL, HDL, LDLCALC, TRIG, CHOLHDL, LDLDIRECT in the last 72 hours. Thyroid function studies: Recent Labs    11/12/2020 1338  TSH 159.296*   Anemia work up: No results for input(s): VITAMINB12, FOLATE, FERRITIN, TIBC, IRON, RETICCTPCT in the last 72 hours. Sepsis Labs: Recent Labs  Lab 10/30/2020 0942 11/21/2020 1238 11/26/2020 1338 11/22/2020 1440 11/06/2020 1621 10/30/2020 2328 11/15/20 0307 2020-11-23 0300  PROCALCITON  --   --  6.20  --   --   --   --  10.12  WBC 5.9  --   --   --   --   --  7.3 19.7*  LATICACIDVEN  2.9* 3.8*  --  2.8* 3.4* 2.6*  --   --     Microbiology Recent Results (from the past 240 hour(s))  Urine Culture     Status: Abnormal   Collection Time: 11/08/2020 10:06 AM   Specimen: In/Out Cath Urine  Result Value Ref Range Status   Specimen Description IN/OUT CATH URINE  Final   Special Requests   Final    NONE Performed at [facility], [address], [place], [place] [address]  Culture MULTIPLE SPECIES PRESENT, SUGGEST RECOLLECTION (A)  Final   Report Status 11/15/2020 FINAL  Final  Resp Panel by RT-PCR (Flu A&B, Covid) Nasopharyngeal Swab     Status: None   Collection Time: 11/18/2020 10:12 AM   Specimen: Nasopharyngeal Swab; Nasopharyngeal(NP) swabs in vial transport medium  Result Value Ref Range Status   SARS Coronavirus 2 by RT PCR NEGATIVE NEGATIVE Final    Comment: (NOTE) SARS-CoV-2 target nucleic acids are NOT DETECTED.  The SARS-CoV-2 RNA is generally detectable in upper respiratory specimens during the acute phase of infection. The lowest concentration of SARS-CoV-2 viral copies this assay can detect is 138 copies/mL. A negative result does not preclude SARS-Cov-2 infection and should not be used as the sole basis for treatment or other patient management decisions. A negative result may occur with  improper specimen collection/handling, submission of specimen other than nasopharyngeal swab, presence of viral mutation(s) within the areas targeted by this assay, and inadequate number of viral copies(<138 copies/mL). A negative result must be combined with clinical observations, patient history, and epidemiological information. The expected result is Negative.  Fact Sheet for Patients:  EntrepreneurPulse.com.au  Fact Sheet for Healthcare Providers:  IncredibleEmployment.be  This test is no t yet approved or cleared by the [place] FDA and  has been authorized for detection and/or diagnosis of SARS-CoV-2 by FDA  under an Emergency Use Authorization (EUA). This EUA will remain  in effect (meaning this test can be used) for the duration of the COVID-19 declaration under Section 564(b)(1) of the Act, 21 U.S.C.section 360bbb-3(b)(1), unless the authorization is terminated  or revoked sooner.       Influenza A by PCR NEGATIVE NEGATIVE Final   Influenza B by PCR NEGATIVE NEGATIVE Final    Comment: (NOTE) The Xpert Xpress SARS-CoV-2/FLU/RSV plus assay is intended as an aid in the diagnosis of influenza from Nasopharyngeal swab specimens and should not be used as a sole basis for treatment. Nasal washings and aspirates are unacceptable for Xpert Xpress SARS-CoV-2/FLU/RSV testing.  Fact Sheet for Patients: EntrepreneurPulse.com.au  Fact Sheet for Healthcare Providers: IncredibleEmployment.be  This test is not yet approved or cleared by the [place] FDA and has been authorized for detection and/or diagnosis of SARS-CoV-2 by FDA under an Emergency Use Authorization (EUA). This EUA will remain in effect (meaning this test can be used) for the duration of the COVID-19 declaration under Section 564(b)(1) of the Act, 21 U.S.C. section 360bbb-3(b)(1), unless the authorization is terminated or revoked.  Performed at [facility], [place] [address], [place], [place] [address]   Blood culture (routine single)     Status: None (Preliminary result)   Collection Time: 10/28/2020 10:20 AM   Specimen: BLOOD  Result Value Ref Range Status   Specimen Description BLOOD SITE NOT SPECIFIED  Final   Special Requests   Final    BOTTLES DRAWN AEROBIC AND ANAEROBIC Blood Culture adequate volume   Culture   Final    NO GROWTH 2 DAYS Performed at [facility], [address], [place], [place] [address]    Report Status PENDING  Incomplete  Culture, blood (single)     Status: None (Preliminary result)   Collection Time: 11/25/2020 12:10 PM   Specimen: BLOOD  Result  Value Ref Range Status   Specimen Description BLOOD LEFT ANTECUBITAL  Final   Special Requests   Final    BOTTLES DRAWN AEROBIC AND ANAEROBIC Blood Culture adequate volume   Culture   Final  NO GROWTH 2 DAYS Performed at [facility], [place] [address] [place], [place] [address]    Report Status PENDING  Incomplete    Procedures and diagnostic studies:  DG CHEST PORT 1 VIEW  Result Date: 11/15/2020 CLINICAL DATA:  Shortness of breath, hypoxia. EXAM: PORTABLE CHEST 1 VIEW COMPARISON:  November 14, 2020. FINDINGS: Stable cardiomediastinal silhouette. Stable large left lung opacity is noted concerning for pneumonia or atelectasis with associated pleural effusion. Increased right upper lobe opacity is noted concerning for worsening pneumonia. Bony thorax is unremarkable. IMPRESSION: Stable large left lung opacity is noted concerning for pneumonia or atelectasis with associated pleural effusion. Increased right upper lobe opacity is noted concerning for worsening pneumonia. Aortic Atherosclerosis (ICD10-I70.0). Electronically Signed   By: Marijo Conception M.D.   On: 11/15/2020 13:56   ECHOCARDIOGRAM COMPLETE  Result Date: 11/15/2020    ECHOCARDIOGRAM REPORT   Patient Name:   ELLIOTTE MARSALIS Date of Exam: 11/15/2020 Medical Rec #:  [id number]             Height:       64.0 in Accession #:    [id number]            Weight:       102.0 lb Date of Birth:  27-Oct-1932             BSA:          1.469 m Patient Age:    65 years              BP:           81/44 mmHg Patient Gender: F                     HR:           67 bpm. Exam Location:  Inpatient Procedure: 2D Echo, Cardiac Doppler and Color Doppler Indications:    Acute respiratory distress  History:        Patient has prior history of Echocardiogram examinations, most                 recent 05/26/2019. CAD, PAD; Risk Factors:Dyslipidemia and                 Hypertension.  Sonographer:    Maudry Mayhew MHA, RDMS, RVT, RDCS Referring Phys:  Protection  1. Left ventricular ejection fraction, by estimation, is 55 to 60%. The left ventricle has normal function. The left ventricle has no regional wall motion abnormalities. Left ventricular diastolic parameters are consistent with Grade I diastolic dysfunction (impaired relaxation).  2. Right ventricular systolic function is normal. The right ventricular size is mildly enlarged. There is mildly elevated pulmonary artery systolic pressure. The estimated right ventricular systolic pressure is 32.2 mmHg.  3. Left atrial size was moderately dilated.  4. Right atrial size was mild to moderately dilated.  5. The mitral valve is normal in structure. Mild mitral valve regurgitation. No evidence of mitral stenosis.  6. Tricuspid valve regurgitation is moderate.  7. The aortic valve is tricuspid. There is mild calcification of the aortic valve. Aortic valve regurgitation is not visualized. Mild aortic valve sclerosis is present, with no evidence of aortic valve stenosis.  8. The inferior vena cava is normal in size with greater than 50% respiratory variability, suggesting right atrial pressure of 3 mmHg. FINDINGS  Left Ventricle: Left ventricular ejection fraction, by estimation, is 55 to 60%. The left ventricle  has normal function. The left ventricle has no regional wall motion abnormalities. The left ventricular internal cavity size was normal in size. There is  no left ventricular hypertrophy. Left ventricular diastolic parameters are consistent with Grade I diastolic dysfunction (impaired relaxation). Right Ventricle: The right ventricular size is mildly enlarged. No increase in right ventricular wall thickness. Right ventricular systolic function is normal. There is mildly elevated pulmonary artery systolic pressure. The tricuspid regurgitant velocity is 2.98 m/s, and with an assumed right atrial pressure of 3 mmHg, the estimated right ventricular systolic pressure is 58.0 mmHg. Left Atrium:  Left atrial size was moderately dilated. Right Atrium: Right atrial size was mild to moderately dilated. Pericardium: There is no evidence of pericardial effusion. Mitral Valve: The mitral valve is normal in structure. Mild mitral valve regurgitation. No evidence of mitral valve stenosis. Tricuspid Valve: The tricuspid valve is normal in structure. Tricuspid valve regurgitation is moderate . No evidence of tricuspid stenosis. Aortic Valve: The aortic valve is tricuspid. There is mild calcification of the aortic valve. Aortic valve regurgitation is not visualized. Mild aortic valve sclerosis is present, with no evidence of aortic valve stenosis. Aortic valve mean gradient measures 5.0 mmHg. Aortic valve peak gradient measures 9.5 mmHg. Aortic valve area, by VTI measures 1.43 cm. Pulmonic Valve: The pulmonic valve was normal in structure. Pulmonic valve regurgitation is not visualized. No evidence of pulmonic stenosis. Aorta: The aortic root is normal in size and structure. Venous: The inferior vena cava is normal in size with greater than 50% respiratory variability, suggesting right atrial pressure of 3 mmHg. IAS/Shunts: No atrial level shunt detected by color flow Doppler.  LEFT VENTRICLE PLAX 2D LVIDd:         4.10 cm   Diastology LVIDs:         2.70 cm   LV e' medial:    5.77 cm/s LV PW:         0.70 cm   LV E/e' medial:  19.4 LV IVS:        0.60 cm   LV e' lateral:   9.11 cm/s LVOT diam:     1.60 cm   LV E/e' lateral: 12.3 LV SV:         40 LV SV Index:   27 LVOT Area:     2.01 cm  RIGHT VENTRICLE RV S prime:     9.90 cm/s TAPSE (M-mode): 1.2 cm LEFT ATRIUM             Index        RIGHT ATRIUM           Index LA diam:        3.60 cm 2.45 cm/m   RA Area:     14.70 cm LA Vol (A2C):   40.7 ml 27.70 ml/m  RA Volume:   34.80 ml  23.68 ml/m LA Vol (A4C):   67.9 ml 46.21 ml/m LA Biplane Vol: 54.3 ml 36.95 ml/m  AORTIC VALVE AV Area (Vmax):    1.24 cm AV Area (Vmean):   1.21 cm AV Area (VTI):     1.43 cm AV  Vmax:           154.00 cm/s AV Vmean:          112.000 cm/s AV VTI:            0.281 m AV Peak Grad:      9.5 mmHg AV Mean Grad:      5.0 mmHg LVOT  Vmax:         94.80 cm/s LVOT Vmean:        67.500 cm/s LVOT VTI:          0.200 m LVOT/AV VTI ratio: 0.71  AORTA Ao Root diam: 2.30 cm MITRAL VALVE                TRICUSPID VALVE MV Area (PHT): 2.83 cm     TR Peak grad:   35.5 mmHg MV Decel Time: 268 msec     TR Vmax:        298.00 cm/s MR Peak grad: 94.1 mmHg MR Mean grad: 59.5 mmHg     SHUNTS MR Vmax:      485.00 cm/s   Systemic VTI:  0.20 m MR Vmean:     364.0 cm/s    Systemic Diam: 1.60 cm MV E velocity: 112.00 cm/s MV A velocity: 120.00 cm/s MV E/A ratio:  0.93 Glori Bickers MD Electronically signed by Glori Bickers MD Signature Date/Time: 11/15/2020/8:13:09 PM    Final     Medications:    amitriptyline  25 mg Oral QHS   Chlorhexidine Gluconate Cloth  6 each Topical Daily   enoxaparin (LOVENOX) injection  30 mg Subcutaneous Q24H   escitalopram  20 mg Oral Daily   guaiFENesin  600 mg Oral BID   insulin aspart  0-6 Units Subcutaneous TID WC   ipratropium-albuterol  3 mL Nebulization Q6H   [START ON 11/18/2020] levothyroxine  75 mcg Intravenous Daily   mesalamine  1.2 g Oral BID   pantoprazole (PROTONIX) IV  40 mg Intravenous Q24H   polyethylene glycol  8.5-17 g Oral QPM   senna-docusate  1 tablet Oral BID   sodium chloride flush  3 mL Intravenous Q12H   Continuous Infusions:  cefTRIAXone (ROCEPHIN)  IV 2 g (Dec 06, 2020 1042)   dextrose 75 mL/hr at 12-06-2020 0800   doxycycline (VIBRAMYCIN) IV 100 mg (12/06/2020 0411)     LOS: 2 days   Geradine Girt  [facility]   How to contact the [facility] Attending or Consulting provider [place] or covering provider during after hours [place], for this patient?  Check the care team in [facility] and look for a) attending/consulting [facility] provider listed and b) the [facility] team listed Log into [email or web address] and use [place]'s universal password to access. If  you do not have the password, please contact the hospital operator. Locate the [facility] provider you are looking for under [facility] and page to a number that you can be directly reached. If you still have difficulty reaching the provider, please page the [facility] (Director on Call) for the Hospitalists listed on amion for assistance.  12-06-20, 2:10 PM

## 2020-11-27 DEATH — deceased
